# Patient Record
Sex: Male | Born: 1950 | Race: White | Hispanic: No | Marital: Married | Smoking: Current some day smoker
Health system: Southern US, Community
[De-identification: ages and names within clinical notes are randomized; demographics above are authoritative.]

## PROBLEM LIST (undated history)

## (undated) DIAGNOSIS — F32A Depression, unspecified: Secondary | ICD-10-CM

## (undated) DIAGNOSIS — K219 Gastro-esophageal reflux disease without esophagitis: Secondary | ICD-10-CM

## (undated) DIAGNOSIS — F329 Major depressive disorder, single episode, unspecified: Secondary | ICD-10-CM

## (undated) DIAGNOSIS — R011 Cardiac murmur, unspecified: Secondary | ICD-10-CM

## (undated) DIAGNOSIS — D649 Anemia, unspecified: Secondary | ICD-10-CM

## (undated) DIAGNOSIS — F419 Anxiety disorder, unspecified: Secondary | ICD-10-CM

## (undated) DIAGNOSIS — I2510 Atherosclerotic heart disease of native coronary artery without angina pectoris: Secondary | ICD-10-CM

## (undated) DIAGNOSIS — IMO0002 Reserved for concepts with insufficient information to code with codable children: Secondary | ICD-10-CM

## (undated) DIAGNOSIS — I4891 Unspecified atrial fibrillation: Secondary | ICD-10-CM

## (undated) DIAGNOSIS — I499 Cardiac arrhythmia, unspecified: Secondary | ICD-10-CM

## (undated) DIAGNOSIS — E785 Hyperlipidemia, unspecified: Secondary | ICD-10-CM

## (undated) DIAGNOSIS — C4491 Basal cell carcinoma of skin, unspecified: Secondary | ICD-10-CM

## (undated) DIAGNOSIS — M1990 Unspecified osteoarthritis, unspecified site: Secondary | ICD-10-CM

## (undated) HISTORY — PX: SQUAMOUS CELL CARCINOMA EXCISION: SHX2433

## (undated) HISTORY — PX: COLONOSCOPY: SHX174

## (undated) HISTORY — PX: TONSILLECTOMY: SUR1361

## (undated) HISTORY — PX: INGUINAL HERNIA REPAIR: SUR1180

## (undated) HISTORY — DX: Depression, unspecified: F32.A

## (undated) HISTORY — DX: Hyperlipidemia, unspecified: E78.5

## (undated) HISTORY — PX: BASAL CELL CARCINOMA EXCISION: SHX1214

## (undated) HISTORY — PX: SHOULDER SURGERY: SHX246

## (undated) HISTORY — DX: Unspecified atrial fibrillation: I48.91

## (undated) HISTORY — PX: COLECTOMY: SHX59

## (undated) HISTORY — DX: Major depressive disorder, single episode, unspecified: F32.9

---

## 2000-08-03 ENCOUNTER — Encounter: Payer: Self-pay | Admitting: Family Medicine

## 2000-08-03 ENCOUNTER — Ambulatory Visit (HOSPITAL_COMMUNITY): Admission: RE | Admit: 2000-08-03 | Discharge: 2000-08-03 | Payer: Self-pay | Admitting: Family Medicine

## 2002-01-18 ENCOUNTER — Ambulatory Visit (HOSPITAL_COMMUNITY): Admission: RE | Admit: 2002-01-18 | Discharge: 2002-01-18 | Payer: Self-pay | Admitting: Family Medicine

## 2002-01-18 ENCOUNTER — Encounter: Payer: Self-pay | Admitting: Family Medicine

## 2003-04-05 ENCOUNTER — Ambulatory Visit (HOSPITAL_COMMUNITY): Admission: RE | Admit: 2003-04-05 | Discharge: 2003-04-05 | Payer: Self-pay | Admitting: Internal Medicine

## 2005-10-06 ENCOUNTER — Ambulatory Visit (HOSPITAL_COMMUNITY): Admission: RE | Admit: 2005-10-06 | Discharge: 2005-10-06 | Payer: Self-pay | Admitting: Family Medicine

## 2005-11-11 ENCOUNTER — Encounter (HOSPITAL_COMMUNITY): Admission: RE | Admit: 2005-11-11 | Discharge: 2005-12-11 | Payer: Self-pay | Admitting: Neurosurgery

## 2005-12-10 ENCOUNTER — Ambulatory Visit (HOSPITAL_COMMUNITY): Admission: RE | Admit: 2005-12-10 | Discharge: 2005-12-10 | Payer: Self-pay | Admitting: Family Medicine

## 2007-06-27 ENCOUNTER — Emergency Department (HOSPITAL_COMMUNITY): Admission: EM | Admit: 2007-06-27 | Discharge: 2007-06-27 | Payer: Self-pay | Admitting: Emergency Medicine

## 2007-06-29 ENCOUNTER — Ambulatory Visit: Payer: Self-pay | Admitting: Orthopedic Surgery

## 2007-06-29 DIAGNOSIS — M25569 Pain in unspecified knee: Secondary | ICD-10-CM | POA: Insufficient documentation

## 2010-06-05 ENCOUNTER — Emergency Department (HOSPITAL_COMMUNITY)
Admission: EM | Admit: 2010-06-05 | Discharge: 2010-06-05 | Disposition: A | Discharge status: Home / Self Care | Payer: BC Managed Care – PPO | Attending: Emergency Medicine | Admitting: Emergency Medicine

## 2010-06-05 DIAGNOSIS — F329 Major depressive disorder, single episode, unspecified: Secondary | ICD-10-CM | POA: Insufficient documentation

## 2010-06-05 DIAGNOSIS — S0180XA Unspecified open wound of other part of head, initial encounter: Secondary | ICD-10-CM | POA: Insufficient documentation

## 2010-06-05 DIAGNOSIS — W1809XA Striking against other object with subsequent fall, initial encounter: Secondary | ICD-10-CM | POA: Insufficient documentation

## 2010-06-05 DIAGNOSIS — F3289 Other specified depressive episodes: Secondary | ICD-10-CM | POA: Insufficient documentation

## 2010-06-05 DIAGNOSIS — E7800 Pure hypercholesterolemia, unspecified: Secondary | ICD-10-CM | POA: Insufficient documentation

## 2010-08-16 NOTE — Op Note (Signed)
NAME:  Jon Jackson, Jon Jackson                        ACCOUNT NO.:  [id number]   MEDICAL RECORD NO.:  [id number]                   PATIENT TYPE:  AMB   LOCATION:  DAY                                  FACILITY:  [facility]   PHYSICIAN:  R. Roetta Sessions, M.D.              DATE OF BIRTH:  10-30-1950   DATE OF PROCEDURE:  04/05/2003  DATE OF DISCHARGE:                                 OPERATIVE REPORT   PROCEDURE:  Screening colonoscopy.   INDICATION FOR PROCEDURE:  The patient is a 60 year old gentleman sent over  out of the courtesy of W. Simone Curia, M.D., for colorectal cancer  screening.  He is devoid of any lower GI tract symptoms.  There is no family  history of colorectal cancer.  He had a colonoscopy back in the 1990s for  what sounds like a sigmoid volvulus, and he tells me Dr. Lovell Sheehan did a  sigmoid resection of his left colon at that time and he has done well ever  since (those records are not available for review).  Colonoscopy is now  being done as a screening maneuver.  This approach has been discussed with  the patient at length.  The potential risks, benefits, and alternatives have  been reviewed, questions answered.  He is agreeable.  Please see my  handwritten H&P for more information.   PROCEDURE NOTE:  The O2 saturation, blood pressure, pulse, and respirations  were monitored throughout the procedure.   CONSCIOUS SEDATION:  Versed 3 mg IV, Demerol 50 mg IV in divided doses.   INSTRUMENT USED:  Olympus video chip adult colonoscope.   FINDINGS:  Digital rectal exam revealed no abnormalities.   ENDOSCOPIC FINDINGS:  Prep was good.   Rectum:  Examination of the rectal mucosa, including retroflexed view of the  anal verge, revealed no abnormalities.   Colon:  The colonic mucosa was surveyed from the rectosigmoid junction  through the left, transverse, and right colon to the area of the appendiceal  orifice, ileocecal valve, and cecum.  These structures were well-seen and  photographed for the record.  From this level the scope was withdrawn, and  all previously-mentioned mucosal surfaces were again seen.  The only  abnormality noted was evidence of prior anastomosis at approximately 45 cm,  appearing to have a side-to-side type configuration.  Mucosa otherwise  appeared normal.  The patient tolerated the procedure well, is reacted in  endoscopy.   IMPRESSION:  1. Normal rectum.  2. Evidence of prior colon surgery, otherwise normal residual colonic     mucosa.   RECOMMENDATIONS:  Repeat colonoscopy 10 years.                                               [name], M.D.    RMR/MEDQ  D:  04/05/2003  T:  04/05/2003  Job:  [id number]   cc:   Donna Bernard, M.D.  [address]  [place]  [place] [address]  Fax: [phone]

## 2010-12-23 LAB — CBC
HCT: 49
Hemoglobin: 16.9
MCHC: 34.5
MCV: 90.9
Platelets: 198
RBC: 5.39
RDW: 12.7
WBC: 14.9 — ABNORMAL HIGH

## 2010-12-23 LAB — COMPREHENSIVE METABOLIC PANEL WITH GFR
Alkaline Phosphatase: 93
BUN: 20
Calcium: 9
Glucose, Bld: 114 — ABNORMAL HIGH
Total Protein: 6.4

## 2010-12-23 LAB — DIFFERENTIAL
Basophils Absolute: 0
Basophils Relative: 0
Eosinophils Absolute: 0
Eosinophils Relative: 0
Lymphocytes Relative: 2 — ABNORMAL LOW
Lymphs Abs: 0.2 — ABNORMAL LOW
Monocytes Absolute: 0.9
Monocytes Relative: 6
Neutro Abs: 13.7 — ABNORMAL HIGH
Neutrophils Relative %: 92 — ABNORMAL HIGH

## 2010-12-23 LAB — COMPREHENSIVE METABOLIC PANEL
ALT: 30
AST: 22
Albumin: 3.9
CO2: 24
Chloride: 111
Creatinine, Ser: 1.18
GFR calc Af Amer: >60
GFR calc non Af Amer: >60
Potassium: 4.6
Sodium: 140
Total Bilirubin: 0.7

## 2010-12-23 LAB — URINALYSIS, ROUTINE W REFLEX MICROSCOPIC
Bilirubin Urine: NEGATIVE
Glucose, UA: NEGATIVE
Hgb urine dipstick: NEGATIVE
Ketones, ur: NEGATIVE
Nitrite: NEGATIVE
Protein, ur: NEGATIVE
Specific Gravity, Urine: 1.025
Urobilinogen, UA: 0.2
pH: 5.5

## 2010-12-23 LAB — LIPASE, BLOOD: Lipase: 22

## 2012-02-18 ENCOUNTER — Ambulatory Visit (HOSPITAL_COMMUNITY)
Admission: RE | Admit: 2012-02-18 | Discharge: 2012-02-18 | Disposition: A | Discharge status: Home / Self Care | Payer: BC Managed Care – PPO | Source: Ambulatory Visit | Attending: Family Medicine | Admitting: Family Medicine

## 2012-02-18 ENCOUNTER — Other Ambulatory Visit: Payer: Self-pay | Admitting: Family Medicine

## 2012-02-18 DIAGNOSIS — M25559 Pain in unspecified hip: Secondary | ICD-10-CM | POA: Insufficient documentation

## 2012-02-18 DIAGNOSIS — M25551 Pain in right hip: Secondary | ICD-10-CM

## 2012-04-13 ENCOUNTER — Other Ambulatory Visit: Payer: Self-pay | Admitting: Orthopedic Surgery

## 2012-04-13 DIAGNOSIS — M48061 Spinal stenosis, lumbar region without neurogenic claudication: Secondary | ICD-10-CM

## 2012-06-21 ENCOUNTER — Encounter: Payer: Self-pay | Admitting: *Deleted

## 2012-06-21 ENCOUNTER — Other Ambulatory Visit: Payer: Self-pay | Admitting: *Deleted

## 2012-06-21 DIAGNOSIS — K219 Gastro-esophageal reflux disease without esophagitis: Secondary | ICD-10-CM | POA: Insufficient documentation

## 2012-06-21 DIAGNOSIS — E785 Hyperlipidemia, unspecified: Secondary | ICD-10-CM | POA: Insufficient documentation

## 2012-06-21 MED ORDER — TAMSULOSIN HCL 0.4 MG PO CAPS: 0.4000 mg | ORAL_CAPSULE | Freq: Every day | ORAL | Status: DC

## 2012-09-05 ENCOUNTER — Other Ambulatory Visit: Payer: Self-pay | Admitting: Family Medicine

## 2012-09-23 ENCOUNTER — Other Ambulatory Visit: Payer: Self-pay | Admitting: Family Medicine

## 2012-10-18 ENCOUNTER — Other Ambulatory Visit: Payer: Self-pay | Admitting: Family Medicine

## 2012-11-23 ENCOUNTER — Ambulatory Visit (INDEPENDENT_AMBULATORY_CARE_PROVIDER_SITE_OTHER): Payer: BC Managed Care – PPO | Admitting: Family Medicine

## 2012-11-23 ENCOUNTER — Encounter: Payer: Self-pay | Admitting: Family Medicine

## 2012-11-23 VITALS — BP 154/88 | Temp 97.7°F | Ht 69.5 in | Wt 222.8 lb

## 2012-11-23 DIAGNOSIS — J019 Acute sinusitis, unspecified: Secondary | ICD-10-CM

## 2012-11-23 DIAGNOSIS — J309 Allergic rhinitis, unspecified: Secondary | ICD-10-CM

## 2012-11-23 MED ORDER — CEFPROZIL 500 MG PO TABS: 500.0000 mg | ORAL_TABLET | Freq: Two times a day (BID) | ORAL | Status: DC

## 2012-11-23 MED ORDER — METHYLPREDNISOLONE ACETATE 40 MG/ML IJ SUSP
40.0000 mg | Freq: Once | INTRAMUSCULAR | Status: AC
  Administered 2012-11-23: 40 mg via INTRAMUSCULAR

## 2012-11-23 NOTE — Progress Notes (Signed)
  Subjective:    Patient ID: MARQUAL MI, male    DOB: September 10, 1950, 62 y.o.   MRN: [id number]  Sinusitis This is a new problem. The current episode started in the past 7 days. The problem has been gradually worsening since onset. There has been no fever. Associated symptoms include congestion, coughing, ear pain, headaches, sinus pressure and a sore throat. Past treatments include oral decongestants. The treatment provided no relief.      Review of Systems  Constitutional: Negative for fever and activity change.  HENT: Positive for ear pain, congestion, sore throat, rhinorrhea and sinus pressure.   Eyes: Negative for discharge.  Respiratory: Positive for cough. Negative for wheezing.   Cardiovascular: Negative for chest pain.  Neurological: Positive for headaches.       Objective:   Physical Exam  Nursing note and vitals reviewed. Constitutional: He appears well-developed.  HENT:  Head: Normocephalic.  Mouth/Throat: Oropharynx is clear and moist. No oropharyngeal exudate.  Neck: Normal range of motion.  Cardiovascular: Normal rate, regular rhythm and normal heart sounds.   No murmur heard. Pulmonary/Chest: Effort normal and breath sounds normal. He has no wheezes.  Lymphadenopathy:    He has no cervical adenopathy.  Neurological: He exhibits normal muscle tone.  Skin: Skin is warm and dry.          Assessment & Plan:  #1 allergic rhinitis Depo-Medrol 40 mg IM #2 sinusitis Cefzil 10 days as directed if high fevers or worse followup #3 wellness exam this fall.

## 2012-12-06 ENCOUNTER — Other Ambulatory Visit: Payer: Self-pay

## 2012-12-06 ENCOUNTER — Telehealth: Payer: Self-pay | Admitting: Family Medicine

## 2012-12-06 MED ORDER — AZITHROMYCIN 250 MG PO TABS: ORAL_TABLET | ORAL | Status: DC

## 2012-12-06 NOTE — Telephone Encounter (Signed)
Ok z pk 

## 2012-12-06 NOTE — Telephone Encounter (Signed)
Just finished antibiotics on Saturday.  Still no better head congestion, sinus pressure around eyes, pressure in ears.  Is taking Mucinex.  No fever but "feels feverish".  Could you call in [facility] to [facility] in [place].

## 2012-12-06 NOTE — Telephone Encounter (Signed)
Zpak sent to [facility]. Left message on voicemail notifying Toniann Fail.

## 2012-12-07 ENCOUNTER — Other Ambulatory Visit: Payer: Self-pay | Admitting: Family Medicine

## 2012-12-10 ENCOUNTER — Encounter: Payer: Self-pay | Admitting: Family Medicine

## 2012-12-10 ENCOUNTER — Ambulatory Visit (INDEPENDENT_AMBULATORY_CARE_PROVIDER_SITE_OTHER): Payer: BC Managed Care – PPO | Admitting: Family Medicine

## 2012-12-10 VITALS — BP 112/72 | Temp 98.1°F | Ht 69.5 in | Wt 215.6 lb

## 2012-12-10 DIAGNOSIS — J309 Allergic rhinitis, unspecified: Secondary | ICD-10-CM

## 2012-12-10 MED ORDER — AMOXICILLIN-POT CLAVULANATE 875-125 MG PO TABS: 1.0000 | ORAL_TABLET | Freq: Two times a day (BID) | ORAL | Status: AC

## 2012-12-10 MED ORDER — FLUTICASONE PROPIONATE 50 MCG/ACT NA SUSP: 1.0000 | Freq: Every day | NASAL | Status: DC

## 2012-12-10 NOTE — Progress Notes (Signed)
  Subjective:    Patient ID: Jon Jackson, male    DOB: Oct 25, 1950, 62 y.o.   MRN: [id number]  HPI Patient is here today for f/u from 8/26. He no longer feels like he has an infection, but he is still having sinus pressure and post nasal drip.   Some pressure, nighttime  xysal taking daily. Worked pretty well, seems less affective.  Fall allergies are starting to kick in. Some congestion drainage. Some minimal sore throat.  Occasional cough.  No obvious fever       Review of Systems No chest pain no abdominal pain no fever no rash ROS otherwise negative    Objective:   Physical Exam Alert hydration good. Vitals reviewed. Frontal maxillary tenderness and fullness. And nasal congestion. Neck supple. Lungs clear heart regular in rhythm.       Assessment & Plan:  Impression rhinosinusitis. #2 allergic rhinitis discussed. Plan add Flonase 2 sprays each nostril daily. Antibiotics prescribed. Symptomatic care discussed. WSL

## 2012-12-12 DIAGNOSIS — J309 Allergic rhinitis, unspecified: Secondary | ICD-10-CM | POA: Insufficient documentation

## 2013-01-14 ENCOUNTER — Ambulatory Visit: Payer: BC Managed Care – PPO

## 2013-01-19 ENCOUNTER — Encounter: Payer: BC Managed Care – PPO | Admitting: Family Medicine

## 2013-01-20 ENCOUNTER — Ambulatory Visit: Payer: BC Managed Care – PPO

## 2013-02-02 ENCOUNTER — Ambulatory Visit (INDEPENDENT_AMBULATORY_CARE_PROVIDER_SITE_OTHER): Payer: BC Managed Care – PPO | Admitting: *Deleted

## 2013-02-02 DIAGNOSIS — Z23 Encounter for immunization: Secondary | ICD-10-CM

## 2013-02-05 ENCOUNTER — Other Ambulatory Visit: Payer: Self-pay | Admitting: Family Medicine

## 2013-03-05 ENCOUNTER — Other Ambulatory Visit: Payer: Self-pay | Admitting: Family Medicine

## 2013-03-23 ENCOUNTER — Other Ambulatory Visit: Payer: Self-pay | Admitting: Family Medicine

## 2013-04-05 ENCOUNTER — Telehealth: Payer: Self-pay | Admitting: Family Medicine

## 2013-04-05 ENCOUNTER — Other Ambulatory Visit: Payer: Self-pay | Admitting: *Deleted

## 2013-04-05 DIAGNOSIS — Z79899 Other long term (current) drug therapy: Secondary | ICD-10-CM

## 2013-04-05 DIAGNOSIS — Z125 Encounter for screening for malignant neoplasm of prostate: Secondary | ICD-10-CM

## 2013-04-05 DIAGNOSIS — E785 Hyperlipidemia, unspecified: Secondary | ICD-10-CM

## 2013-04-05 NOTE — Telephone Encounter (Signed)
Patient notified

## 2013-04-05 NOTE — Telephone Encounter (Signed)
Pt needs BW orders please, was supposed to be done already not sure  What happened?   He had a PE scheduled for this Thursday.   Call pt when sent to [facility]

## 2013-04-05 NOTE — Telephone Encounter (Signed)
Lip liv m7 psa 

## 2013-04-06 LAB — BASIC METABOLIC PANEL
BUN: 17 mg/dL (ref 6–23)
CALCIUM: 9.5 mg/dL (ref 8.4–10.5)
CO2: 25 mEq/L (ref 19–32)
CREATININE: 0.91 mg/dL (ref 0.50–1.35)
Chloride: 108 mEq/L (ref 96–112)
GLUCOSE: 89 mg/dL (ref 70–99)
Potassium: 4.4 mEq/L (ref 3.5–5.3)
SODIUM: 142 meq/L (ref 135–145)

## 2013-04-06 LAB — LIPID PANEL
CHOLESTEROL: 152 mg/dL (ref 0–200)
HDL: 39 mg/dL — ABNORMAL LOW (ref >39)
LDL Cholesterol: 94 mg/dL (ref 0–99)
TRIGLYCERIDES: 94 mg/dL (ref <150)
Total CHOL/HDL Ratio: 3.9 Ratio
VLDL: 19 mg/dL (ref 0–40)

## 2013-04-06 LAB — HEPATIC FUNCTION PANEL
ALT: 20 U/L (ref 0–53)
AST: 19 U/L (ref 0–37)
Albumin: 4 g/dL (ref 3.5–5.2)
Alkaline Phosphatase: 86 U/L (ref 39–117)
BILIRUBIN INDIRECT: 0.4 mg/dL (ref 0.0–0.9)
Bilirubin, Direct: 0.1 mg/dL (ref 0.0–0.3)
TOTAL PROTEIN: 6.7 g/dL (ref 6.0–8.3)
Total Bilirubin: 0.5 mg/dL (ref 0.3–1.2)

## 2013-04-06 LAB — PSA: PSA: 0.47 ng/mL (ref <=4.00)

## 2013-04-07 ENCOUNTER — Ambulatory Visit (INDEPENDENT_AMBULATORY_CARE_PROVIDER_SITE_OTHER): Payer: BC Managed Care – PPO | Admitting: Family Medicine

## 2013-04-07 ENCOUNTER — Encounter: Payer: Self-pay | Admitting: Family Medicine

## 2013-04-07 VITALS — BP 120/84 | Ht 67.5 in | Wt 225.2 lb

## 2013-04-07 DIAGNOSIS — N400 Enlarged prostate without lower urinary tract symptoms: Secondary | ICD-10-CM

## 2013-04-07 DIAGNOSIS — F329 Major depressive disorder, single episode, unspecified: Secondary | ICD-10-CM

## 2013-04-07 DIAGNOSIS — Z0000 Encounter for general adult medical examination without abnormal findings: Secondary | ICD-10-CM

## 2013-04-07 DIAGNOSIS — F3289 Other specified depressive episodes: Secondary | ICD-10-CM

## 2013-04-07 DIAGNOSIS — F32A Depression, unspecified: Secondary | ICD-10-CM

## 2013-04-07 MED ORDER — SIMVASTATIN 20 MG PO TABS: ORAL_TABLET | ORAL | Status: DC

## 2013-04-07 MED ORDER — OMEPRAZOLE 40 MG PO CPDR: 40.0000 mg | DELAYED_RELEASE_CAPSULE | Freq: Every day | ORAL | Status: DC

## 2013-04-07 MED ORDER — TAMSULOSIN HCL 0.4 MG PO CAPS: ORAL_CAPSULE | ORAL | Status: DC

## 2013-04-07 MED ORDER — CITALOPRAM HYDROBROMIDE 20 MG PO TABS: ORAL_TABLET | ORAL | Status: DC

## 2013-04-07 MED ORDER — LEVOCETIRIZINE DIHYDROCHLORIDE 5 MG PO TABS: ORAL_TABLET | ORAL | Status: DC

## 2013-04-07 NOTE — Progress Notes (Signed)
Subjective:    Patient ID: Jon Jackson, male    DOB: Aug 12, 1950, 63 y.o.   MRN: [id number]  HPI Patient is here today for his annual wellness exam. Has no concerns or issues at this time.   Results for orders placed in visit on 04/05/13  LIPID PANEL      Result Value Range   Cholesterol 152  0 - 200 mg/dL   Triglycerides 94  <150 mg/dL   HDL 39 (*) >39 mg/dL   Total CHOL/HDL Ratio 3.9     VLDL 19  0 - 40 mg/dL   LDL Cholesterol 94  0 - 99 mg/dL  HEPATIC FUNCTION PANEL      Result Value Range   Total Bilirubin 0.5  0.3 - 1.2 mg/dL   Bilirubin, Direct 0.1  0.0 - 0.3 mg/dL   Indirect Bilirubin 0.4  0.0 - 0.9 mg/dL   Alkaline Phosphatase 86  39 - 117 U/L   AST 19  0 - 37 U/L   ALT 20  0 - 53 U/L   Total Protein 6.7  6.0 - 8.3 g/dL   Albumin 4.0  3.5 - 5.2 g/dL  PSA      Result Value Range   PSA 0.47  <=4.00 ng/mL  BASIC METABOLIC PANEL      Result Value Range   Sodium 142  135 - 145 mEq/L   Potassium 4.4  3.5 - 5.3 mEq/L   Chloride 108  96 - 112 mEq/L   CO2 25  19 - 32 mEq/L   Glucose, Bld 89  70 - 99 mg/dL   BUN 17  6 - 23 mg/dL   Creat 0.91  0.50 - 1.35 mg/dL   Calcium 9.5  8.4 - 10.5 mg/dL   Diet off and on  Last colonoscopy jan o f 2005, had it done loca Doing very well.    Overall exercising regularly.  Reflux is clinically stable at this time. Patient claims compliance with lipid medication. Also compliant with Flomax. It has persisted his urination situation.  Review of Systems  Constitutional: Negative for fever, activity change and appetite change.  HENT: Negative for congestion and rhinorrhea.   Eyes: Negative for discharge.  Respiratory: Negative for cough and wheezing.   Cardiovascular: Negative for chest pain.  Gastrointestinal: Negative for vomiting, abdominal pain and blood in stool.  Genitourinary: Positive for difficulty urinating. Negative for frequency.       Some nocturnal frequency intermittently  Musculoskeletal: Negative for neck  pain.  Skin: Negative for rash.  Allergic/Immunologic: Negative for environmental allergies and food allergies.  Neurological: Negative for weakness and headaches.  Psychiatric/Behavioral: Negative for agitation.  All other systems reviewed and are negative.       Objective:   Physical Exam  Vitals reviewed. Constitutional: He appears well-developed and well-nourished.  HENT:  Head: Normocephalic and atraumatic.  Right Ear: External ear normal.  Left Ear: External ear normal.  Nose: Nose normal.  Mouth/Throat: Oropharynx is clear and moist.  Eyes: EOM are normal. Pupils are equal, round, and reactive to light.  Neck: Normal range of motion. Neck supple. No thyromegaly present.  Cardiovascular: Normal rate, regular rhythm and normal heart sounds.   No murmur heard. Pulmonary/Chest: Effort normal and breath sounds normal. No respiratory distress. He has no wheezes.  Abdominal: Soft. Bowel sounds are normal. He exhibits no distension and no mass. There is no tenderness.  Genitourinary: Penis normal.  Prostate mildly enlarged diffusely no nodules or abnormalities otherwise  Musculoskeletal: Normal range of motion. He exhibits no edema.  Lymphadenopathy:    He has no cervical adenopathy.  Neurological: He is alert. He exhibits normal muscle tone.  Skin: Skin is warm and dry. No erythema.  Psychiatric: He has a normal mood and affect. His behavior is normal. Judgment normal.          Assessment & Plan:  #1 wellness exam. #2 hyperlipidemia good control. #3 reflux stable. #4 prostate hypertrophy ongoing. #5 depression clinically stable plan maintain same meds. Diet exercise discussed. Check in 6 months. Given shingles vaccine prescription WSL

## 2013-04-08 DIAGNOSIS — F329 Major depressive disorder, single episode, unspecified: Secondary | ICD-10-CM | POA: Insufficient documentation

## 2013-04-08 DIAGNOSIS — F32A Depression, unspecified: Secondary | ICD-10-CM | POA: Insufficient documentation

## 2013-04-08 DIAGNOSIS — N400 Enlarged prostate without lower urinary tract symptoms: Secondary | ICD-10-CM | POA: Insufficient documentation

## 2013-05-04 ENCOUNTER — Telehealth: Payer: Self-pay | Admitting: *Deleted

## 2013-05-04 NOTE — Telephone Encounter (Signed)
Pt called to schedule a colonoscopy. please advise [phone]

## 2013-05-05 ENCOUNTER — Other Ambulatory Visit: Payer: Self-pay

## 2013-05-05 DIAGNOSIS — Z1211 Encounter for screening for malignant neoplasm of colon: Secondary | ICD-10-CM

## 2013-05-05 NOTE — Telephone Encounter (Signed)
Gastroenterology Pre-Procedure Review  Request Date: 05/05/2013  Requesting Physician: Pt received a letter that it was time for his next colonoscopy His last one was 04/05/2003 by Dr. [name]  Pt said he drinks 4-5 beers on the week-end  PATIENT REVIEW QUESTIONS: The patient responded to the following health history questions as indicated:    1. Diabetes Melitis: no 2. Joint replacements in the past 12 months: no 3. Major health problems in the past 3 months: no 4. Has an artificial valve or MVP: no 5. Has a defibrillator: no 6. Has been advised in past to take antibiotics in advance of a procedure like teeth cleaning: no    MEDICATIONS & ALLERGIES:    Patient reports the following regarding taking any blood thinners:   Plavix? no  Aspirin? YES Coumadin? no  Patient confirms/reports the following medications:  Current Outpatient Prescriptions  Medication Sig Dispense Refill  . acetaminophen (TYLENOL) 325 MG tablet Take 650 mg by mouth every 6 (six) hours as needed.      Marland Kitchen aspirin 325 MG tablet Take 325 mg by mouth daily.      . citalopram (CELEXA) 20 MG tablet TAKE 1 TABLET BY MOUTH EVERY DAY  90 tablet  1  . fluticasone (FLONASE) 50 MCG/ACT nasal spray Place 1 spray into the nose daily.  16 g  2  . levocetirizine (XYZAL) 5 MG tablet TAKE 1 TABLET EVERY DAY  90 tablet  1  . Multiple Vitamin (MULTIVITAMIN) tablet Take 1 tablet by mouth daily.      Marland Kitchen omeprazole (PRILOSEC) 40 MG capsule Take 1 capsule (40 mg total) by mouth daily.  90 capsule  1  . simvastatin (ZOCOR) 20 MG tablet TAKE 1 TABLET BY MOUTH EVERY DAY  90 tablet  1  . tamsulosin (FLOMAX) 0.4 MG CAPS capsule TAKE 1 CAPSULE BY MOUTH AT BEDTIME. prn  90 capsule  1   No current facility-administered medications for this visit.    Patient confirms/reports the following allergies:  No Known Allergies  No orders of the defined types were placed in this encounter.    AUTHORIZATION INFORMATION Primary Insurance:   ID #:    Group #:  Pre-Cert / Auth required:  Pre-Cert / Auth #:   Secondary Insurance:  ID #:  Group #:  Pre-Cert / Auth required: Pre-Cert / Auth #:   SCHEDULE INFORMATION: Procedure has been scheduled as follows:  Date:   05/30/2013                 Time: 8:45 AM Location: [facility] Short Stay  This Gastroenterology Pre-Precedure Review Form is being routed to the following provider(s): R. Garfield Cornea, MD

## 2013-05-05 NOTE — Telephone Encounter (Signed)
OK to schedule

## 2013-05-06 MED ORDER — PEG-KCL-NACL-NASULF-NA ASC-C 100 G PO SOLR: 1.0000 | ORAL | Status: DC

## 2013-05-06 NOTE — Telephone Encounter (Signed)
Rx sent to the pharmacy and instructions mailed to pt.  

## 2013-05-06 NOTE — Addendum Note (Signed)
Addended by: Everardo All on: 05/06/2013 08:07 AM   Modules accepted: Orders

## 2013-05-18 ENCOUNTER — Encounter (HOSPITAL_COMMUNITY): Payer: Self-pay | Admitting: Pharmacy Technician

## 2013-05-24 ENCOUNTER — Telehealth: Payer: Self-pay

## 2013-05-24 NOTE — Telephone Encounter (Signed)
I called [facility] at [phone] and spoke to [place] J who said that a PA is not required for a screening colonoscopy.

## 2013-05-30 ENCOUNTER — Encounter (HOSPITAL_COMMUNITY): Payer: Self-pay

## 2013-05-30 ENCOUNTER — Encounter (HOSPITAL_COMMUNITY)
Admission: RE | Disposition: A | Discharge status: Home / Self Care | Payer: Self-pay | Source: Ambulatory Visit | Attending: Internal Medicine

## 2013-05-30 ENCOUNTER — Ambulatory Visit (HOSPITAL_COMMUNITY)
Admission: RE | Admit: 2013-05-30 | Discharge: 2013-05-30 | Disposition: A | Discharge status: Home / Self Care | Payer: BC Managed Care – PPO | Source: Ambulatory Visit | Attending: Internal Medicine | Admitting: Internal Medicine

## 2013-05-30 DIAGNOSIS — K5730 Diverticulosis of large intestine without perforation or abscess without bleeding: Secondary | ICD-10-CM | POA: Insufficient documentation

## 2013-05-30 DIAGNOSIS — I10 Essential (primary) hypertension: Secondary | ICD-10-CM | POA: Insufficient documentation

## 2013-05-30 DIAGNOSIS — Z9049 Acquired absence of other specified parts of digestive tract: Secondary | ICD-10-CM | POA: Insufficient documentation

## 2013-05-30 DIAGNOSIS — Z800 Family history of malignant neoplasm of digestive organs: Secondary | ICD-10-CM

## 2013-05-30 DIAGNOSIS — Z79899 Other long term (current) drug therapy: Secondary | ICD-10-CM | POA: Insufficient documentation

## 2013-05-30 DIAGNOSIS — Z1211 Encounter for screening for malignant neoplasm of colon: Secondary | ICD-10-CM | POA: Insufficient documentation

## 2013-05-30 DIAGNOSIS — Z7982 Long term (current) use of aspirin: Secondary | ICD-10-CM | POA: Insufficient documentation

## 2013-05-30 HISTORY — DX: Gastro-esophageal reflux disease without esophagitis: K21.9

## 2013-05-30 HISTORY — PX: COLONOSCOPY: SHX5424

## 2013-05-30 SURGERY — COLONOSCOPY
Anesthesia: Moderate Sedation

## 2013-05-30 MED ORDER — MEPERIDINE HCL 100 MG/ML IJ SOLN
INTRAMUSCULAR | Status: AC
  Filled 2013-05-30: qty 1

## 2013-05-30 MED ORDER — ONDANSETRON HCL 4 MG/2ML IJ SOLN
INTRAMUSCULAR | Status: AC
  Filled 2013-05-30: qty 2

## 2013-05-30 MED ORDER — SODIUM CHLORIDE 0.9 % IV SOLN
INTRAVENOUS | Status: DC
  Administered 2013-05-30: 08:00:00 via INTRAVENOUS

## 2013-05-30 MED ORDER — STERILE WATER FOR IRRIGATION IR SOLN
Status: DC | PRN
  Administered 2013-05-30: 09:00:00

## 2013-05-30 MED ORDER — MIDAZOLAM HCL 5 MG/5ML IJ SOLN
INTRAMUSCULAR | Status: AC
  Filled 2013-05-30: qty 10

## 2013-05-30 MED ORDER — MEPERIDINE HCL 100 MG/ML IJ SOLN
INTRAMUSCULAR | Status: DC | PRN
  Administered 2013-05-30: 25 mg via INTRAVENOUS
  Administered 2013-05-30: 50 mg via INTRAVENOUS

## 2013-05-30 MED ORDER — ONDANSETRON HCL 4 MG/2ML IJ SOLN
INTRAMUSCULAR | Status: DC | PRN
  Administered 2013-05-30: 4 mg via INTRAVENOUS

## 2013-05-30 MED ORDER — MIDAZOLAM HCL 5 MG/5ML IJ SOLN
INTRAMUSCULAR | Status: DC | PRN
  Administered 2013-05-30 (×2): 2 mg via INTRAVENOUS

## 2013-05-30 MED ORDER — MEPERIDINE HCL 100 MG/ML IJ SOLN
INTRAMUSCULAR | Status: AC
  Filled 2013-05-30: qty 2

## 2013-05-30 NOTE — H&P (Signed)
Primary Care Physician:  Rubbie Battiest, MD Primary Gastroenterologist:  Dr. [name]  Pre-Procedure History & Physical: HPI:  Jon Jackson is a 63 y.o. male is here for a screening colonoscopy. Positive family history of colon cancer in his brother at age 33. Prior history of colon surgery for volvulus. No bowel symptoms currently.  Past Medical History  Diagnosis Date  . Hypertension   . GERD (gastroesophageal reflux disease)   . Cancer     skin cancer squamous cell off head    Past Surgical History  Procedure Laterality Date  . Colon surgery      20 years ago  . Hernia repair      25 -30 years ago [facility]    Prior to Admission medications   Medication Sig Start Date End Date Taking? Authorizing Provider  acetaminophen (TYLENOL) 325 MG tablet Take 650 mg by mouth every 6 (six) hours as needed for mild pain, moderate pain or fever.    Yes Historical Provider, MD  aspirin 325 MG tablet Take 325 mg by mouth daily.   Yes Historical Provider, MD  Aspirin-Acetaminophen (GOODY BODY PAIN) 500-325 MG PACK Take 1 packet by mouth daily as needed (for pain).   Yes Historical Provider, MD  citalopram (CELEXA) 20 MG tablet TAKE 1 TABLET BY MOUTH EVERY DAY 04/07/13  Yes Mikey Kirschner, MD  levocetirizine (XYZAL) 5 MG tablet TAKE 1 TABLET EVERY DAY 04/07/13  Yes Mikey Kirschner, MD  Multiple Vitamin (MULTIVITAMIN) tablet Take 1 tablet by mouth daily.   Yes Historical Provider, MD  omeprazole (PRILOSEC) 40 MG capsule Take 1 capsule (40 mg total) by mouth daily. 04/07/13  Yes Mikey Kirschner, MD  peg 3350 powder (MOVIPREP) 100 G SOLR Take 1 kit (200 g total) by mouth as directed. 05/06/13  Yes [name], MD  simvastatin (ZOCOR) 20 MG tablet TAKE 1 TABLET BY MOUTH EVERY DAY 04/07/13  Yes Mikey Kirschner, MD  tamsulosin (FLOMAX) 0.4 MG CAPS capsule TAKE 1 CAPSULE BY MOUTH AT BEDTIME. prn 04/07/13  Yes Mikey Kirschner, MD    Allergies as of 05/05/2013  . (No Known Allergies)    Family History   Problem Relation Age of Onset  . Colon cancer Brother     History   Social History  . Marital Status: Married    Spouse Name: N/A    Number of Children: N/A  . Years of Education: N/A   Occupational History  . Not on file.   Social History Main Topics  . Smoking status: Light Tobacco Smoker    Types: Cigars  . Smokeless tobacco: Not on file  . Alcohol Use: 6.0 oz/week    10 Cans of beer per week  . Drug Use: No  . Sexual Activity: Not on file   Other Topics Concern  . Not on file   Social History Narrative  . No narrative on file    Review of Systems: See HPI, otherwise negative ROS  Physical Exam: BP 117/78  Pulse 55  Temp(Src) 98.4 F (36.9 C) (Oral)  Resp 16  Ht 5' 9.5" (1.765 m)  Wt 220 lb (99.791 kg)  BMI 32.03 kg/m2  SpO2 95% General:   Alert,  Well-developed, well-nourished, pleasant and cooperative in NAD Head:  Normocephalic and atraumatic. Eyes:  Sclera clear, no icterus.   Conjunctiva pink. Ears:  Normal auditory acuity. Nose:  No deformity, discharge,  or lesions. Mouth:  No deformity or lesions, dentition normal. Neck:  Supple;  no masses or thyromegaly. Lungs:  Clear throughout to auscultation.   No wheezes, crackles, or rhonchi. No acute distress. Heart:  Regular rate and rhythm; no murmurs, clicks, rubs,  or gallops. Abdomen:  Soft, nontender and nondistended. No masses, hepatosplenomegaly or hernias noted. Normal bowel sounds, without guarding, and without rebound.   Msk:  Symmetrical without gross deformities. Normal posture. Pulses:  Normal pulses noted. Extremities:  Without clubbing or edema. Neurologic:  Alert and  oriented x4;  grossly normal neurologically. Skin:  Intact without significant lesions or rashes. Cervical Nodes:  No significant cervical adenopathy. Psych:  Alert and cooperative. Normal mood and affect.  Impression/Plan: [name] is now here to undergo a screening colonoscopy. High risk screening  examination.  Risks, benefits, limitations, imponderables and alternatives regarding colonoscopy have been reviewed with the patient. Questions have been answered. All parties agreeable.

## 2013-05-30 NOTE — Op Note (Signed)
[facility] [address] [place], [address]   COLONOSCOPY PROCEDURE REPORT  PATIENT: Jon, Jackson  MR#:         [id number] BIRTHDATE: [birth year] , 66  yrs. old GENDER: Male ENDOSCOPIST: R.  Garfield Cornea, MD FACP FACG REFERRED BY:  Rosemary Holms, M.D. PROCEDURE DATE:  05/30/2013 PROCEDURE:     Screening ileocolonoscopy  INDICATIONS: Positive family history colon cancer in a first-degree relative  INFORMED CONSENT:  The risks, benefits, alternatives and imponderables including but not limited to bleeding, perforation as well as the possibility of a missed lesion have been reviewed.  The potential for biopsy, lesion removal, etc. have also been discussed.  Questions have been answered.  All parties agreeable. Please see the history and physical in the medical record for more information.  MEDICATIONS: Versed 4 mg IV and Demerol 75 mg IV in divided doses. Zofran 4 mg IV  DESCRIPTION OF PROCEDURE:  After a digital rectal exam was performed, the EC-[id number] ([id number])  colonoscope was advanced from the anus through the rectum and colon to the area of the cecum, ileocecal valve and appendiceal orifice.  The cecum was deeply intubated.  These structures were well-seen and photographed for the record.  From the level of the cecum and ileocecal valve, the scope was slowly and cautiously withdrawn.  The mucosal surfaces were carefully surveyed utilizing scope tip deflection to facilitate fold flattening as needed.  The scope was pulled down into the rectum where a thorough examination including retroflexion was performed.    FINDINGS: Adequate  preparation. Normal rectum. Scattered distal colonic diverticula with anastomosis identified consistent with prior sigmoid resection; otherwise, the remainder of the colonic mucosa appeared normal. The distal 5 cm of terminal ileal mucosa appeared normal as well.  THERAPEUTIC / DIAGNOSTIC MANEUVERS PERFORMED:   None  COMPLICATIONS: None  CECAL WITHDRAWAL TIME:  9 minutes  IMPRESSION:  Colonic diverticulosis.  Status post prior segmental colon resection  RECOMMENDATIONS: Repeat colonoscopy in 5 years for screening purposes   _______________________________ eSigned:  R. Garfield Cornea, MD FACP [facility] 05/30/2013 9:13 AM   CC:

## 2013-05-30 NOTE — Discharge Instructions (Signed)
Colonoscopy Discharge Instructions  Read the instructions outlined below and refer to this sheet in the next few weeks. These discharge instructions provide you with general information on caring for yourself after you leave the hospital. Your doctor may also give you specific instructions. While your treatment has been planned according to the most current medical practices available, unavoidable complications occasionally occur. If you have any problems or questions after discharge, call Dr. [name] at [phone]. ACTIVITY  You may resume your regular activity, but move at a slower pace for the next 24 hours.   Take frequent rest periods for the next 24 hours.   Walking will help get rid of the air and reduce the bloated feeling in your belly (abdomen).   No driving for 24 hours (because of the medicine (anesthesia) used during the test).    Do not sign any important legal documents or operate any machinery for 24 hours (because of the anesthesia used during the test).  NUTRITION  Drink plenty of fluids.   You may resume your normal diet as instructed by your doctor.   Begin with a light meal and progress to your normal diet. Heavy or fried foods are harder to digest and may make you feel sick to your stomach (nauseated).   Avoid alcoholic beverages for 24 hours or as instructed.  MEDICATIONS  You may resume your normal medications unless your doctor tells you otherwise.  WHAT YOU CAN EXPECT TODAY  Some feelings of bloating in the abdomen.   Passage of more gas than usual.   Spotting of blood in your stool or on the toilet paper.  IF YOU HAD POLYPS REMOVED DURING THE COLONOSCOPY:  No aspirin products for 7 days or as instructed.   No alcohol for 7 days or as instructed.   Eat a soft diet for the next 24 hours.  FINDING OUT THE RESULTS OF YOUR TEST Not all test results are available during your visit. If your test results are not back during the visit, make an appointment  with your caregiver to find out the results. Do not assume everything is normal if you have not heard from your caregiver or the medical facility. It is important for you to follow up on all of your test results.  SEEK IMMEDIATE MEDICAL ATTENTION IF:  You have more than a spotting of blood in your stool.   Your belly is swollen (abdominal distention).   You are nauseated or vomiting.   You have a temperature over 101.   You have abdominal pain or discomfort that is severe or gets worse throughout the day.   Diverticulosis information provided  Repeat screening colonoscopy in 5 years    Diverticulosis Diverticulosis is a common condition that develops when small pouches (diverticula) form in the wall of the colon. The risk of diverticulosis increases with age. It happens more often in people who eat a low-fiber diet. Most individuals with diverticulosis have no symptoms. Those individuals with symptoms usually experience abdominal pain, constipation, or loose stools (diarrhea). HOME CARE INSTRUCTIONS   Increase the amount of fiber in your diet as directed by your caregiver or dietician. This may reduce symptoms of diverticulosis.  Your caregiver may recommend taking a dietary fiber supplement.  Drink at least 6 to 8 glasses of water each day to prevent constipation.  Try not to strain when you have a bowel movement.  Your caregiver may recommend avoiding nuts and seeds to prevent complications, although this is still an uncertain benefit.  Only take over-the-counter or prescription medicines for pain, discomfort, or fever as directed by your caregiver. FOODS WITH HIGH FIBER CONTENT INCLUDE:  Fruits. Apple, peach, pear, tangerine, raisins, prunes.  Vegetables. Brussels sprouts, asparagus, broccoli, cabbage, carrot, cauliflower, romaine lettuce, spinach, summer squash, tomato, winter squash, zucchini.  Starchy Vegetables. Baked beans, kidney beans, lima beans, split peas,  lentils, potatoes (with skin).  Grains. Whole wheat bread, brown rice, bran flake cereal, plain oatmeal, white rice, shredded wheat, bran muffins. SEEK IMMEDIATE MEDICAL CARE IF:   You develop increasing pain or severe bloating.  You have an oral temperature above 102 F (38.9 C), not controlled by medicine.  You develop vomiting or bowel movements that are bloody or black. Document Released: 12/13/2003 Document Revised: 06/09/2011 Document Reviewed: 08/15/2009 [facility] Patient Information 2014 [place].

## 2013-06-03 ENCOUNTER — Encounter (HOSPITAL_COMMUNITY): Payer: Self-pay | Admitting: Internal Medicine

## 2013-09-29 ENCOUNTER — Other Ambulatory Visit: Payer: Self-pay | Admitting: Family Medicine

## 2013-12-25 ENCOUNTER — Other Ambulatory Visit: Payer: Self-pay | Admitting: Family Medicine

## 2014-02-22 ENCOUNTER — Other Ambulatory Visit: Payer: Self-pay | Admitting: Family Medicine

## 2014-03-15 ENCOUNTER — Telehealth: Payer: Self-pay | Admitting: Family Medicine

## 2014-03-15 DIAGNOSIS — E785 Hyperlipidemia, unspecified: Secondary | ICD-10-CM

## 2014-03-15 DIAGNOSIS — Z79899 Other long term (current) drug therapy: Secondary | ICD-10-CM

## 2014-03-15 DIAGNOSIS — Z125 Encounter for screening for malignant neoplasm of prostate: Secondary | ICD-10-CM

## 2014-03-15 NOTE — Telephone Encounter (Signed)
Rep same, i already spoke with pt about it being in the syst with no requirement for papers (also offered condolence on brother)

## 2014-03-15 NOTE — Telephone Encounter (Signed)
Pt needs bw orders for appt on the 11th of Jan   Last labs 1/7  Lipid, Hep Func, PSA, BMP

## 2014-03-15 NOTE — Telephone Encounter (Signed)
bw orders for apt on jan 4th   Last labs

## 2014-03-16 NOTE — Telephone Encounter (Signed)
Blood work orders placed in Epic. Patient notified. 

## 2014-03-25 ENCOUNTER — Other Ambulatory Visit: Payer: Self-pay | Admitting: Family Medicine

## 2014-04-03 ENCOUNTER — Ambulatory Visit: Payer: BC Managed Care – PPO | Admitting: Family Medicine

## 2014-04-07 LAB — LIPID PANEL
Cholesterol: 158 mg/dL (ref 0–200)
HDL: 35 mg/dL — ABNORMAL LOW
LDL Cholesterol: 102 mg/dL — ABNORMAL HIGH (ref 0–99)
Total CHOL/HDL Ratio: 4.5 ratio
Triglycerides: 106 mg/dL
VLDL: 21 mg/dL (ref 0–40)

## 2014-04-07 LAB — BASIC METABOLIC PANEL WITH GFR
BUN: 16 mg/dL (ref 6–23)
CO2: 27 meq/L (ref 19–32)
Calcium: 9.2 mg/dL (ref 8.4–10.5)
Chloride: 106 meq/L (ref 96–112)
Creat: 0.95 mg/dL (ref 0.50–1.35)
Glucose, Bld: 90 mg/dL (ref 70–99)
Potassium: 4.6 meq/L (ref 3.5–5.3)
Sodium: 142 meq/L (ref 135–145)

## 2014-04-07 LAB — HEPATIC FUNCTION PANEL
ALK PHOS: 91 U/L (ref 39–117)
ALT: 22 U/L (ref 0–53)
AST: 19 U/L (ref 0–37)
Albumin: 4 g/dL (ref 3.5–5.2)
BILIRUBIN TOTAL: 0.4 mg/dL (ref 0.2–1.2)
Bilirubin, Direct: 0.1 mg/dL (ref 0.0–0.3)
Indirect Bilirubin: 0.3 mg/dL (ref 0.2–1.2)
TOTAL PROTEIN: 6.4 g/dL (ref 6.0–8.3)

## 2014-04-08 LAB — PSA: PSA: 0.55 ng/mL (ref <=4.00)

## 2014-04-10 ENCOUNTER — Ambulatory Visit (INDEPENDENT_AMBULATORY_CARE_PROVIDER_SITE_OTHER): Payer: BLUE CROSS/BLUE SHIELD | Admitting: Family Medicine

## 2014-04-10 ENCOUNTER — Encounter: Payer: Self-pay | Admitting: Family Medicine

## 2014-04-10 VITALS — BP 138/86 | Ht 70.0 in | Wt 228.0 lb

## 2014-04-10 DIAGNOSIS — J209 Acute bronchitis, unspecified: Secondary | ICD-10-CM

## 2014-04-10 DIAGNOSIS — E785 Hyperlipidemia, unspecified: Secondary | ICD-10-CM

## 2014-04-10 DIAGNOSIS — Z0000 Encounter for general adult medical examination without abnormal findings: Secondary | ICD-10-CM

## 2014-04-10 DIAGNOSIS — N400 Enlarged prostate without lower urinary tract symptoms: Secondary | ICD-10-CM

## 2014-04-10 MED ORDER — OMEPRAZOLE 40 MG PO CPDR: 40.0000 mg | DELAYED_RELEASE_CAPSULE | Freq: Every day | ORAL | Status: DC

## 2014-04-10 MED ORDER — TAMSULOSIN HCL 0.4 MG PO CAPS: 0.4000 mg | ORAL_CAPSULE | Freq: Every evening | ORAL | Status: DC | PRN

## 2014-04-10 MED ORDER — SIMVASTATIN 20 MG PO TABS: 20.0000 mg | ORAL_TABLET | Freq: Every day | ORAL | Status: DC

## 2014-04-10 MED ORDER — AZITHROMYCIN 250 MG PO TABS: ORAL_TABLET | ORAL | Status: DC

## 2014-04-10 MED ORDER — LEVOCETIRIZINE DIHYDROCHLORIDE 5 MG PO TABS: ORAL_TABLET | ORAL | Status: DC

## 2014-04-10 MED ORDER — CITALOPRAM HYDROBROMIDE 20 MG PO TABS: 20.0000 mg | ORAL_TABLET | Freq: Every day | ORAL | Status: DC

## 2014-04-10 NOTE — Progress Notes (Signed)
Subjective:    Patient ID: Jon Jackson, male    DOB: 23-Dec-1950, 64 y.o.   MRN: [id number]  Hyperlipidemia This is a chronic problem. The current episode started more than 1 year ago. Pertinent negatives include no chest pain. Compliance problems include adherence to exercise and adherence to diet.   Had bloodwork done on Jan 8th.  No concerns today.   Results for orders placed or performed in visit on 03/15/14  Lipid panel  Result Value Ref Range   Cholesterol 158 0 - 200 mg/dL   Triglycerides 106 <150 mg/dL   HDL 35 (L) >39 mg/dL   Total CHOL/HDL Ratio 4.5 Ratio   VLDL 21 0 - 40 mg/dL   LDL Cholesterol 102 (H) 0 - 99 mg/dL  Hepatic function panel  Result Value Ref Range   Total Bilirubin 0.4 0.2 - 1.2 mg/dL   Bilirubin, Direct 0.1 0.0 - 0.3 mg/dL   Indirect Bilirubin 0.3 0.2 - 1.2 mg/dL   Alkaline Phosphatase 91 39 - 117 U/L   AST 19 0 - 37 U/L   ALT 22 0 - 53 U/L   Total Protein 6.4 6.0 - 8.3 g/dL   Albumin 4.0 3.5 - 5.2 g/dL  PSA  Result Value Ref Range   PSA 0.55 <=4.00 ng/mL  Basic metabolic panel  Result Value Ref Range   Sodium 142 135 - 145 mEq/L   Potassium 4.6 3.5 - 5.3 mEq/L   Chloride 106 96 - 112 mEq/L   CO2 27 19 - 32 mEq/L   Glucose, Bld 90 70 - 99 mg/dL   BUN 16 6 - 23 mg/dL   Creat 0.95 0.50 - 1.35 mg/dL   Calcium 9.2 8.4 - 10.5 mg/dL   Exercise is so so these days, not doing much lately  Bronchial cough, productive, still hanging on .  Urinating bdeter at night, usdes flomax on more of a prn basis,  sticks with celexa definitlely helps  Also here for wellness exam. Up-to-date on colonoscopy.  Trying to watch his diet.  Overall improved energy level.   Still experiencing urination issues, gets up frequently at nighttime. Notes the Flomax is definitely helps.  Cough is productive at times. Review of Systems  Constitutional: Negative for fever, activity change and appetite change.  HENT: Negative for congestion and rhinorrhea.     Eyes: Negative for discharge.  Respiratory: Negative for cough and wheezing.        Deep bronchial cough productive yellowish phlegm  Cardiovascular: Negative for chest pain.  Gastrointestinal: Negative for vomiting, abdominal pain and blood in stool.  Genitourinary: Negative for frequency and difficulty urinating.  Musculoskeletal: Negative for neck pain.  Skin: Negative for rash.  Allergic/Immunologic: Negative for environmental allergies and food allergies.  Neurological: Negative for weakness and headaches.  Psychiatric/Behavioral: Negative for agitation.  All other systems reviewed and are negative.      Objective:   Physical Exam  Constitutional: He appears well-developed and well-nourished.  HENT:  Head: Normocephalic and atraumatic.  Right Ear: External ear normal.  Left Ear: External ear normal.  Nose: Nose normal.  Mouth/Throat: Oropharynx is clear and moist.  Eyes: EOM are normal. Pupils are equal, round, and reactive to light.  Neck: Normal range of motion. Neck supple. No thyromegaly present.  Cardiovascular: Normal rate, regular rhythm and normal heart sounds.   No murmur heard. Pulmonary/Chest: Effort normal and breath sounds normal. No respiratory distress. He has no wheezes.  Abdominal: Soft. Bowel sounds are normal. He  exhibits no distension and no mass. There is no tenderness.  Genitourinary: Penis normal.  Prostate mild enlargement no discrete nodules or masses  Musculoskeletal: Normal range of motion. He exhibits no edema.  Lymphadenopathy:    He has no cervical adenopathy.  Neurological: He is alert. He exhibits normal muscle tone.  Skin: Skin is warm and dry. No erythema.  Psychiatric: He has a normal mood and affect. His behavior is normal. Judgment normal.  Vitals reviewed.         Assessment & Plan:  Impression 1 wellness exam #2 bronchitis discussed #3 hyperlipidemia-relatively good control #4 allergic rhinitis #5 prostate her hypertrophy  plan antibiotics prescribed. Symptomatic care discussed. Maintain same dose of chronic medications. Add Z-Pak. Diet exercise discussed encourage. WSL

## 2014-05-19 ENCOUNTER — Other Ambulatory Visit: Payer: Self-pay | Admitting: Family Medicine

## 2014-08-11 ENCOUNTER — Ambulatory Visit (INDEPENDENT_AMBULATORY_CARE_PROVIDER_SITE_OTHER): Payer: BLUE CROSS/BLUE SHIELD | Admitting: Family Medicine

## 2014-08-11 ENCOUNTER — Encounter: Payer: Self-pay | Admitting: Family Medicine

## 2014-08-11 VITALS — BP 140/80 | Ht 70.0 in | Wt 224.4 lb

## 2014-08-11 DIAGNOSIS — M4806 Spinal stenosis, lumbar region: Secondary | ICD-10-CM | POA: Diagnosis not present

## 2014-08-11 DIAGNOSIS — M48061 Spinal stenosis, lumbar region without neurogenic claudication: Secondary | ICD-10-CM | POA: Insufficient documentation

## 2014-08-11 MED ORDER — PREDNISONE 20 MG PO TABS: ORAL_TABLET | ORAL | Status: DC

## 2014-08-11 NOTE — Progress Notes (Signed)
   Subjective:    Patient ID: Jon Jackson, male    DOB: April 14, 1950, 64 y.o.   MRN: [id number]  Leg Pain  The incident occurred more than 1 week ago. The incident occurred at home. There was no injury mechanism. The pain is present in the left leg and right leg. The quality of the pain is described as cramping. The pain is moderate. The pain has been intermittent since onset. He reports no foreign bodies present. Nothing aggravates the symptoms. Treatments tried: exercise  The treatment provided no relief.   Patient states that he has no other concerns at this time.   Mornings the worse   Also bad after sitting for a period of time  Feels bilat and like muscles getting to cramp  Stretching and walking and not helping much  Pos hx of pinched nerve and sciatica, uses an inversion table . Has helped a sciatic a lot.  Of note discomfort hits the morning just literally moments after standing. Both posterior thighs deep ache. Radiates and in the buttocks and down to the calf. Starts to improve with increased ambulation. Then returns with protracted sitting and similar presentation. No change in urinary or bowel habits.  Old records including old MRI reviewed  Review of Systems No headache no chest pain no back pain no abdominal pain no change in bowel habits    Objective:   Physical Exam  Alert vital stable HET normal lungs clear heart rare rhythm spine nontender negative true straight leg raise distal pulses sensation and reflexes all intact      Assessment & Plan:  Impression probable spinal stenosis discussed at length. MRI of 7 years ago revealed an element of it at that time. Positive history of sciatica. Plan trial of prednisone taper. If worsens will need to do MRI. Rationale discussed. No change in urinary or bowel habits per patient. We will see how this is doing in July regular checkup if we do not hear back before [facility]

## 2014-10-05 ENCOUNTER — Other Ambulatory Visit: Payer: Self-pay | Admitting: Family Medicine

## 2014-10-10 ENCOUNTER — Telehealth: Payer: Self-pay | Admitting: Family Medicine

## 2014-10-10 DIAGNOSIS — E785 Hyperlipidemia, unspecified: Secondary | ICD-10-CM

## 2014-10-10 DIAGNOSIS — Z79899 Other long term (current) drug therapy: Secondary | ICD-10-CM

## 2014-10-10 NOTE — Telephone Encounter (Signed)
Lip liv 

## 2014-10-10 NOTE — Telephone Encounter (Signed)
Pt is requesting lab orders to be sent over for his upcoming wellness visit. Last labs per epic were: lipid,hepatic,psa,and bmp on 04/07/14

## 2014-10-10 NOTE — Telephone Encounter (Signed)
bw orders ready. Pt notified on voicemail.  

## 2014-10-20 ENCOUNTER — Telehealth: Payer: Self-pay | Admitting: Family Medicine

## 2014-10-20 NOTE — Telephone Encounter (Signed)
ERROR

## 2014-11-01 ENCOUNTER — Encounter: Payer: BLUE CROSS/BLUE SHIELD | Admitting: Family Medicine

## 2014-11-16 ENCOUNTER — Other Ambulatory Visit: Payer: Self-pay | Admitting: Family Medicine

## 2014-11-16 NOTE — Telephone Encounter (Signed)
Ok plus 3 ref 

## 2014-11-18 ENCOUNTER — Other Ambulatory Visit: Payer: Self-pay | Admitting: Family Medicine

## 2014-11-19 LAB — HEPATIC FUNCTION PANEL
ALK PHOS: 87 U/L (ref 40–115)
ALT: 23 U/L (ref 9–46)
AST: 19 U/L (ref 10–35)
Albumin: 4 g/dL (ref 3.6–5.1)
BILIRUBIN DIRECT: 0.1 mg/dL (ref <=0.2)
BILIRUBIN INDIRECT: 0.5 mg/dL (ref 0.2–1.2)
Total Bilirubin: 0.6 mg/dL (ref 0.2–1.2)
Total Protein: 6.1 g/dL (ref 6.1–8.1)

## 2014-11-19 LAB — LIPID PANEL
CHOLESTEROL: 143 mg/dL (ref 125–200)
HDL: 46 mg/dL (ref >=40)
LDL CALC: 83 mg/dL (ref <130)
TRIGLYCERIDES: 70 mg/dL (ref <150)
Total CHOL/HDL Ratio: 3.1 Ratio (ref <=5.0)
VLDL: 14 mg/dL (ref <30)

## 2014-11-22 ENCOUNTER — Ambulatory Visit (INDEPENDENT_AMBULATORY_CARE_PROVIDER_SITE_OTHER): Payer: BLUE CROSS/BLUE SHIELD | Admitting: Family Medicine

## 2014-11-22 ENCOUNTER — Encounter: Payer: Self-pay | Admitting: Family Medicine

## 2014-11-22 VITALS — BP 130/82 | Ht 70.0 in | Wt 228.4 lb

## 2014-11-22 DIAGNOSIS — J301 Allergic rhinitis due to pollen: Secondary | ICD-10-CM | POA: Diagnosis not present

## 2014-11-22 DIAGNOSIS — N400 Enlarged prostate without lower urinary tract symptoms: Secondary | ICD-10-CM | POA: Diagnosis not present

## 2014-11-22 DIAGNOSIS — K219 Gastro-esophageal reflux disease without esophagitis: Secondary | ICD-10-CM | POA: Diagnosis not present

## 2014-11-22 DIAGNOSIS — E785 Hyperlipidemia, unspecified: Secondary | ICD-10-CM | POA: Diagnosis not present

## 2014-11-22 NOTE — Progress Notes (Signed)
   Subjective:    Patient ID: Jon Jackson, male    DOB: 1950/12/17, 64 y.o.   MRN: [id number] Patient arrives office for several concerns.   Hyperlipidemia This is a chronic problem. The current episode started more than 1 year ago. Treatments tried: zocor. There are no compliance problems.  Risk factors for coronary artery disease include dyslipidemia.    Results for orders placed or performed in visit on 11/18/14  Lipid panel  Result Value Ref Range   Cholesterol 143 125 - 200 mg/dL   Triglycerides 70 <150 mg/dL   HDL 46 >=40 mg/dL   Total CHOL/HDL Ratio 3.1 <=5.0 Ratio   VLDL 14 <30 mg/dL   LDL Cholesterol 83 <130 mg/dL  Hepatic function panel  Result Value Ref Range   Total Bilirubin 0.6 0.2 - 1.2 mg/dL   Bilirubin, Direct 0.1 <=0.2 mg/dL   Indirect Bilirubin 0.5 0.2 - 1.2 mg/dL   Alkaline Phosphatase 87 40 - 115 U/L   AST 19 10 - 35 U/L   ALT 23 9 - 46 U/L   Total Protein 6.1 6.1 - 8.1 g/dL   Albumin 4.0 3.6 - 5.1 g/dL   States has backed off on Flomax morbid when necessary basis. Less difficulty urinating at night. Thought it might be: Been causing a bit of swollen ankles so has held off on it. Overall doing good without it  States reflux clinically stable. States Deplin needs to stay on the medication for it. No obvious side effects.   Compliant medicine for depression or mood. Feels it is still helping him. No obvious side effects. Next  Trying to exercise quite a bit  Review of Systems No chest pain no headache no abdominal pain no change in bowel habits no blood in stool    Objective:   Physical Exam Alert no acute distress. H&T normal. Lungs clear. Heart rare rhythm. Abdomen benign. Ankles without edema. Blood pressure good on repeat       Assessment & Plan:  Impression 1 hyperlipidemia excellent control #2 reflux clinically stable #3 chronic rhinitis stable #4 depression clinically stable #5 prostate hypertrophy stable plan maintain all medications.  Diet exercise discussed. Recheck in 6 months. Wellness exam plus medical problem visit at that time [facility]

## 2014-11-29 ENCOUNTER — Ambulatory Visit (INDEPENDENT_AMBULATORY_CARE_PROVIDER_SITE_OTHER): Payer: BLUE CROSS/BLUE SHIELD | Admitting: Family Medicine

## 2014-11-29 ENCOUNTER — Other Ambulatory Visit: Payer: Self-pay | Admitting: Family Medicine

## 2014-11-29 ENCOUNTER — Encounter: Payer: Self-pay | Admitting: Family Medicine

## 2014-11-29 VITALS — BP 134/90 | Temp 99.0°F | Ht 70.0 in | Wt 228.0 lb

## 2014-11-29 DIAGNOSIS — R21 Rash and other nonspecific skin eruption: Secondary | ICD-10-CM

## 2014-11-29 MED ORDER — DOXYCYCLINE HYCLATE 100 MG PO TABS: 100.0000 mg | ORAL_TABLET | Freq: Two times a day (BID) | ORAL | Status: DC

## 2014-11-29 NOTE — Progress Notes (Signed)
   Subjective:    Patient ID: Jon Jackson, male    DOB: 07-06-1950, 64 y.o.   MRN: [id number]  HPIpulled tick off left side 3 days ago. Rash around site. Having some headache for the past several days. Low grade fever.   Loose stools  Headache diffuse  Temp max 99  Tiny tick present at bite site.  Has developed rash. Nonpruritic nonpainful    Review of Systems No high headache no f major fever    Objective:   Physical Exam  Alert active no acute distress. HEENT normal. Lungs clear. Heart regular in rhythm. Lateral hip annular rash with central clearing at site of bite      Assessment & Plan:  Impression probable bite reaction and not true tickborne illness however with systemic features will cover discussed at length plan Dr. 100 twice a day 10 days. Symptom care discussed. WSL

## 2015-02-19 ENCOUNTER — Other Ambulatory Visit: Payer: Self-pay | Admitting: Family Medicine

## 2015-03-15 ENCOUNTER — Other Ambulatory Visit: Payer: Self-pay | Admitting: Family Medicine

## 2015-05-25 ENCOUNTER — Other Ambulatory Visit: Payer: Self-pay | Admitting: Family Medicine

## 2015-06-18 ENCOUNTER — Telehealth: Payer: Self-pay | Admitting: Family Medicine

## 2015-06-18 MED ORDER — SIMVASTATIN 20 MG PO TABS: ORAL_TABLET | ORAL | Status: DC

## 2015-06-18 MED ORDER — OMEPRAZOLE 40 MG PO CPDR: DELAYED_RELEASE_CAPSULE | ORAL | Status: DC

## 2015-06-18 MED ORDER — CITALOPRAM HYDROBROMIDE 20 MG PO TABS: ORAL_TABLET | ORAL | Status: DC

## 2015-06-18 MED ORDER — LEVOCETIRIZINE DIHYDROCHLORIDE 5 MG PO TABS: 5.0000 mg | ORAL_TABLET | Freq: Every day | ORAL | Status: DC

## 2015-06-18 NOTE — Telephone Encounter (Signed)
Yes three months total

## 2015-06-18 NOTE — Telephone Encounter (Signed)
May we fill the Citalopram?

## 2015-06-18 NOTE — Telephone Encounter (Signed)
Spoke with patient and informed him per Dr.Steve Palouse sent over to [facility] in [place]. Patient verbalized understanding.

## 2015-06-18 NOTE — Telephone Encounter (Signed)
pts switching to [facility]  Out of meds, has appt wed am  Wants new scripts sent today if possible for   Xyzal, citalopram, omeprazole and simvastatin

## 2015-06-20 ENCOUNTER — Ambulatory Visit: Payer: BLUE CROSS/BLUE SHIELD | Admitting: Family Medicine

## 2015-07-14 ENCOUNTER — Other Ambulatory Visit: Payer: Self-pay | Admitting: Family Medicine

## 2015-07-17 ENCOUNTER — Encounter: Payer: Self-pay | Admitting: Family Medicine

## 2015-07-17 ENCOUNTER — Ambulatory Visit (INDEPENDENT_AMBULATORY_CARE_PROVIDER_SITE_OTHER): Payer: Medicare Other | Admitting: Family Medicine

## 2015-07-17 VITALS — BP 130/80 | Ht 70.0 in | Wt 221.5 lb

## 2015-07-17 DIAGNOSIS — E785 Hyperlipidemia, unspecified: Secondary | ICD-10-CM | POA: Diagnosis not present

## 2015-07-17 DIAGNOSIS — F329 Major depressive disorder, single episode, unspecified: Secondary | ICD-10-CM | POA: Diagnosis not present

## 2015-07-17 DIAGNOSIS — Z125 Encounter for screening for malignant neoplasm of prostate: Secondary | ICD-10-CM | POA: Diagnosis not present

## 2015-07-17 DIAGNOSIS — R21 Rash and other nonspecific skin eruption: Secondary | ICD-10-CM | POA: Diagnosis not present

## 2015-07-17 DIAGNOSIS — Z79899 Other long term (current) drug therapy: Secondary | ICD-10-CM | POA: Diagnosis not present

## 2015-07-17 DIAGNOSIS — F32A Depression, unspecified: Secondary | ICD-10-CM

## 2015-07-17 MED ORDER — TRIAMCINOLONE ACETONIDE 0.1 % EX CREA: 1.0000 "application " | TOPICAL_CREAM | Freq: Two times a day (BID) | CUTANEOUS | Status: DC

## 2015-07-17 MED ORDER — CITALOPRAM HYDROBROMIDE 20 MG PO TABS: ORAL_TABLET | ORAL | Status: DC

## 2015-07-17 MED ORDER — TAMSULOSIN HCL 0.4 MG PO CAPS: ORAL_CAPSULE | ORAL | Status: DC

## 2015-07-17 MED ORDER — SIMVASTATIN 20 MG PO TABS: ORAL_TABLET | ORAL | Status: DC

## 2015-07-17 MED ORDER — LEVOCETIRIZINE DIHYDROCHLORIDE 5 MG PO TABS: 5.0000 mg | ORAL_TABLET | Freq: Every day | ORAL | Status: DC

## 2015-07-17 MED ORDER — OMEPRAZOLE 40 MG PO CPDR: DELAYED_RELEASE_CAPSULE | ORAL | Status: DC

## 2015-07-17 NOTE — Progress Notes (Signed)
   Subjective:    Patient ID: Jon Jackson, male    DOB: 03-11-1951, 65 y.o.   MRN: [id number]  Hyperlipidemia This is a chronic problem. The current episode started more than 1 year ago. There are no known factors aggravating his hyperlipidemia. Current antihyperlipidemic treatment includes statins. The current treatment provides moderate improvement of lipids. There are no compliance problems.  There are no known risk factors for coronary artery disease.   compliant with lipid medication. No obvious side effects.   Patient has a rash on his hands and the arch of his left foot. Onset 1 month ago.  Slightly pruritic mostly to scaly. Walking a l every morning, next  Compliant with the Celexa. Stasis. Definitely help in his chronic depression. Also helps distress no obvious side effects. We definitely like to stay on it.   Trying to watch diet more close  Using flonase now with the allergies plus the xysal  , states overall his meds are helping his allergies considerably    Review of Systems No headache, no major weight loss or weight gain, no chest pain no back pain abdominal pain no change in bowel habits complete ROS otherwise negative     Objective:   Physical Exam   alert vitals stable HET Mondays congestion pharynx normal neck supple. Lungs clear. Heart rare rhythm. Blood pressure excellent. Hands and feet mild scaly dermatitis      Assessment & Plan:  Wellness plus chronic to be scheduled in 6 months today #1 depression clinically stable on meds maintain same #2 allergic rhinitis ongoing continue same meds #3 hyperlipidemia status uncertain will check blood work #4 hand dermatitis likely non-fungal discuss plan trial of steroids other meds refilled diet exercise discussed appropriate blood work further recommendations based results WSL

## 2015-07-24 DIAGNOSIS — E785 Hyperlipidemia, unspecified: Secondary | ICD-10-CM | POA: Diagnosis not present

## 2015-07-24 DIAGNOSIS — Z79899 Other long term (current) drug therapy: Secondary | ICD-10-CM | POA: Diagnosis not present

## 2015-07-24 DIAGNOSIS — Z125 Encounter for screening for malignant neoplasm of prostate: Secondary | ICD-10-CM | POA: Diagnosis not present

## 2015-07-25 ENCOUNTER — Encounter: Payer: Self-pay | Admitting: Family Medicine

## 2015-07-25 LAB — HEPATIC FUNCTION PANEL
ALBUMIN: 4.5 g/dL (ref 3.6–4.8)
ALT: 22 IU/L (ref 0–44)
AST: 20 IU/L (ref 0–40)
Alkaline Phosphatase: 95 IU/L (ref 39–117)
BILIRUBIN TOTAL: 0.6 mg/dL (ref 0.0–1.2)
Bilirubin, Direct: 0.15 mg/dL (ref 0.00–0.40)
TOTAL PROTEIN: 6.8 g/dL (ref 6.0–8.5)

## 2015-07-25 LAB — PSA: Prostate Specific Ag, Serum: 0.7 ng/mL (ref 0.0–4.0)

## 2015-07-25 LAB — LIPID PANEL
CHOLESTEROL TOTAL: 173 mg/dL (ref 100–199)
Chol/HDL Ratio: 3.8 ratio units (ref 0.0–5.0)
HDL: 45 mg/dL (ref >39)
LDL CALC: 104 mg/dL — AB (ref 0–99)
Triglycerides: 119 mg/dL (ref 0–149)
VLDL CHOLESTEROL CAL: 24 mg/dL (ref 5–40)

## 2015-07-25 LAB — BASIC METABOLIC PANEL
BUN/Creatinine Ratio: 17 (ref 10–24)
BUN: 17 mg/dL (ref 8–27)
CALCIUM: 9.8 mg/dL (ref 8.6–10.2)
CO2: 22 mmol/L (ref 18–29)
CREATININE: 1.03 mg/dL (ref 0.76–1.27)
Chloride: 102 mmol/L (ref 96–106)
GFR, EST AFRICAN AMERICAN: 88 mL/min/{1.73_m2} (ref >59)
GFR, EST NON AFRICAN AMERICAN: 76 mL/min/{1.73_m2} (ref >59)
Glucose: 106 mg/dL — ABNORMAL HIGH (ref 65–99)
POTASSIUM: 4.7 mmol/L (ref 3.5–5.2)
Sodium: 143 mmol/L (ref 134–144)

## 2015-09-05 DIAGNOSIS — Z85828 Personal history of other malignant neoplasm of skin: Secondary | ICD-10-CM | POA: Diagnosis not present

## 2015-09-05 DIAGNOSIS — L570 Actinic keratosis: Secondary | ICD-10-CM | POA: Diagnosis not present

## 2016-01-30 ENCOUNTER — Ambulatory Visit (HOSPITAL_COMMUNITY)
Admission: RE | Admit: 2016-01-30 | Discharge: 2016-01-30 | Disposition: A | Discharge status: Home / Self Care | Payer: Medicare Other | Source: Ambulatory Visit | Attending: Family Medicine | Admitting: Family Medicine

## 2016-01-30 ENCOUNTER — Ambulatory Visit (INDEPENDENT_AMBULATORY_CARE_PROVIDER_SITE_OTHER): Payer: Medicare Other | Admitting: Family Medicine

## 2016-01-30 ENCOUNTER — Encounter: Payer: Self-pay | Admitting: Family Medicine

## 2016-01-30 VITALS — BP 120/88 | Ht 70.0 in | Wt 222.0 lb

## 2016-01-30 DIAGNOSIS — M25512 Pain in left shoulder: Secondary | ICD-10-CM | POA: Insufficient documentation

## 2016-01-30 DIAGNOSIS — M19012 Primary osteoarthritis, left shoulder: Secondary | ICD-10-CM | POA: Insufficient documentation

## 2016-01-30 DIAGNOSIS — S4992XA Unspecified injury of left shoulder and upper arm, initial encounter: Secondary | ICD-10-CM | POA: Diagnosis not present

## 2016-01-30 NOTE — Progress Notes (Signed)
   Subjective:    Patient ID: Jon Jackson, male    DOB: March 02, 1951, 65 y.o.   MRN: [id number]  Fall  The accident occurred 12 to 24 hours ago. There was no blood loss. The point of impact was the left shoulder. The pain is at a severity of 9/10. The pain is moderate. The symptoms are aggravated by movement. He has tried NSAID for the symptoms. The treatment provided no relief.   Patient states that he has no other concerns at this time.  Patient notes pain and swelling and shoulder. Some difficulty moving  Review of Systems No headache, no major weight loss or weight gain, no chest pain no back pain abdominal pain no change in bowel habits complete ROS otherwise negative     Objective:   Physical Exam Alert vitals stable, NAD. Blood pressure good on repeat. HEENT normal. Lungs clear. Heart regular rate and rhythm. Left shoulder positive swelling anteriorly decent range of motion with substantial discomfort       Assessment & Plan:  Impression fracture versus dislocation versus rotator cuff injury. Sent for urgent x-rays. X-rays negative just shows old arthritis. Symptom care. Plus ibuprofen. Patient declines pain pills. Use sling. If a week from now no substantial improvement return for further

## 2016-02-12 ENCOUNTER — Encounter: Payer: Self-pay | Admitting: Family Medicine

## 2016-02-12 ENCOUNTER — Ambulatory Visit (INDEPENDENT_AMBULATORY_CARE_PROVIDER_SITE_OTHER): Payer: Medicare Other | Admitting: Family Medicine

## 2016-02-12 VITALS — BP 132/82 | Ht 70.0 in | Wt 223.4 lb

## 2016-02-12 DIAGNOSIS — E785 Hyperlipidemia, unspecified: Secondary | ICD-10-CM

## 2016-02-12 DIAGNOSIS — Z125 Encounter for screening for malignant neoplasm of prostate: Secondary | ICD-10-CM | POA: Diagnosis not present

## 2016-02-12 DIAGNOSIS — Z0000 Encounter for general adult medical examination without abnormal findings: Secondary | ICD-10-CM | POA: Diagnosis not present

## 2016-02-12 DIAGNOSIS — Z79899 Other long term (current) drug therapy: Secondary | ICD-10-CM | POA: Diagnosis not present

## 2016-02-12 DIAGNOSIS — R002 Palpitations: Secondary | ICD-10-CM

## 2016-02-12 DIAGNOSIS — I481 Persistent atrial fibrillation: Secondary | ICD-10-CM | POA: Diagnosis not present

## 2016-02-12 DIAGNOSIS — I4819 Other persistent atrial fibrillation: Secondary | ICD-10-CM

## 2016-02-12 DIAGNOSIS — Z00129 Encounter for routine child health examination without abnormal findings: Secondary | ICD-10-CM

## 2016-02-12 DIAGNOSIS — Z23 Encounter for immunization: Secondary | ICD-10-CM | POA: Diagnosis not present

## 2016-02-12 NOTE — Progress Notes (Signed)
Subjective:    Patient ID: Jon Jackson, male    DOB: [birth year], 65 y.o.   MRN: [id number]  HPI The patient comes in today for a wellness visit.  Results for orders placed or performed in visit on 07/17/15  Lipid panel  Result Value Ref Range   Cholesterol, Total 173 100 - 199 mg/dL   Triglycerides 119 0 - 149 mg/dL   HDL 45 >39 mg/dL   VLDL Cholesterol Cal 24 5 - 40 mg/dL   LDL Calculated 104 (H) 0 - 99 mg/dL   Chol/HDL Ratio 3.8 0.0 - 5.0 ratio units  Hepatic function panel  Result Value Ref Range   Total Protein 6.8 6.0 - 8.5 g/dL   Albumin 4.5 3.6 - 4.8 g/dL   Bilirubin Total 0.6 0.0 - 1.2 mg/dL   Bilirubin, Direct 0.15 0.00 - 0.40 mg/dL   Alkaline Phosphatase 95 39 - 117 IU/L   AST 20 0 - 40 IU/L   ALT 22 0 - 44 IU/L  Basic metabolic panel  Result Value Ref Range   Glucose 106 (H) 65 - 99 mg/dL   BUN 17 8 - 27 mg/dL   Creatinine, Ser 1.03 0.76 - 1.27 mg/dL   GFR calc non Af Amer 76 >59 mL/min/1.73   GFR calc Af Amer 88 >59 mL/min/1.73   BUN/Creatinine Ratio 17 10 - 24   Sodium 143 134 - 144 mmol/L   Potassium 4.7 3.5 - 5.2 mmol/L   Chloride 102 96 - 106 mmol/L   CO2 22 18 - 29 mmol/L   Calcium 9.8 8.6 - 10.2 mg/dL  PSA  Result Value Ref Range   Prostate Specific Ag, Serum 0.7 0.0 - 4.0 ng/mL   Patient continues to take lipid medication regularly. No obvious side effects from it. Generally does not miss a dose. Prior blood work results are reviewed with patient. Patient continues to work on fat intake in diet  Patient notes ongoing compliance with antidepressant medication. No obvious side effects. Reports does not miss a dose. Overall continues to help depression substantially. No thoughts of homicide or suicide. Would like to maintain medication.   A review of their health history was completed.  A review of medications was also completed.  Any needed refills; yes  Eating habits: eating good  Falls/  MVA accidents in past few months: none  Regular  exercise: walking  Specialist pt sees on regular basis: no  Preventative health issues were discussed.   Additional concerns: slipped back into smoking and would like to quit    Review of Systems  Constitutional: Negative.  Negative for activity change, appetite change and fever.  HENT: Negative for congestion and rhinorrhea.   Eyes: Negative for discharge.  Respiratory: Negative for cough and wheezing.   Cardiovascular: Negative for chest pain.  Gastrointestinal: Negative for abdominal pain, blood in stool and vomiting.  Genitourinary: Negative for difficulty urinating and frequency.  Musculoskeletal: Negative for neck pain.  Skin: Negative for rash.  Allergic/Immunologic: Negative for environmental allergies and food allergies.  Neurological: Negative for weakness and headaches.  Psychiatric/Behavioral: Negative for agitation.  All other systems reviewed and are negative.      Objective:   Physical Exam  Constitutional: He appears well-developed and well-nourished.  HENT:  Head: Normocephalic and atraumatic.  Right Ear: External ear normal.  Left Ear: External ear normal.  Nose: Nose normal.  Mouth/Throat: Oropharynx is clear and moist.  Eyes: EOM are normal. Pupils are equal, round, and reactive to  light.  Neck: Normal range of motion. Neck supple. No thyromegaly present.  Cardiovascular: Normal rate, regular rhythm and normal heart sounds.   No murmur heard. Heart tachycardia noted irregular rhythm noted  Pulmonary/Chest: Effort normal and breath sounds normal. No respiratory distress. He has no wheezes.  Abdominal: Soft. Bowel sounds are normal. He exhibits no distension and no mass. There is no tenderness.  Genitourinary: Penis normal.  Musculoskeletal: Normal range of motion. He exhibits no edema.  Lymphadenopathy:    He has no cervical adenopathy.  Neurological: He is alert. He exhibits normal muscle tone.  Skin: Skin is warm and dry. No erythema.    Psychiatric: He has a normal mood and affect. His behavior is normal. Judgment normal.  Vitals reviewed.   EKG probable atrial fibrillation with increased heart rate no true P waves seen.      Assessment & Plan:  Impression 1 wellness exam up-to-date on colonoscopy. In need of vaccines. Diet exercise discussed. #2 atrial fibrillation very long discussion held. Regarding the nature of this condition. Patient to maintain aspirin now. With heart rate only slightly up will await cardiologist's input as far as anticoagulation and additional medications. Patient already taking 04/03/2023 milligram aspirin No. 3 hyperlipidemia status uncertain need to do blood work discussed #4 depression clinically stable to maintain same blood work. Pneumonia shot today. Flu shot today.

## 2016-02-12 NOTE — Patient Instructions (Signed)
Jon Jackson has atrial fibrillation  It sounds bad, but actually pretty common and as long as we treat it properly, things will be ok  This heart thytm can increase the risk of stroke, which means we need to consider how much to thin the blood to help avoid that  Whenever we first diagnose a fi, we have the patient see a cardiologist  Please read the educational information that I will include today  Our heart signal normally start at the top of the heart, and travel thru a channel to the rest of the heart  In atrial fibrillation, that normal conduct of the electical thythm gets disrupted somewho, around age 65 all of [place] are at risk of converting to a fib at a rate of 1 per cent a year or so  Sometimes this spontaneously disappears, but usaully not  The heart docs will do a couple of tests, including an echocardiogram  Atrial Fibrillation Atrial fibrillation is a type of irregular or rapid heartbeat (arrhythmia). In atrial fibrillation, the heart quivers continuously in a chaotic pattern. This occurs when parts of the heart receive disorganized signals that make the heart unable to pump blood normally. This can increase the risk for stroke, heart failure, and other heart-related conditions. There are different types of atrial fibrillation, including:  Paroxysmal atrial fibrillation. This type starts suddenly, and it usually stops on its own shortly after it starts.  Persistent atrial fibrillation. This type often lasts longer than a week. It may stop on its own or with treatment.  Long-lasting persistent atrial fibrillation. This type lasts longer than 12 months.  Permanent atrial fibrillation. This type does not go away. Talk with your health care provider to learn about the type of atrial fibrillation that you have. What are the causes? This condition is caused by some heart-related conditions or procedures, including:  A heart attack.  Coronary artery disease.  Heart  failure.  Heart valve conditions.  High blood pressure.  Inflammation of the sac that surrounds the heart (pericarditis).  Heart surgery.  Certain heart rhythm disorders, such as Wolf-Parkinson-White syndrome. Other causes include:  Pneumonia.  Obstructive sleep apnea.  Blockage of an artery in the lungs (pulmonary embolism, or PE).  Lung cancer.  Chronic lung disease.  Thyroid problems, especially if the thyroid is overactive (hyperthyroidism).  Caffeine.  Excessive alcohol use or illegal drug use.  Use of some medicines, including certain decongestants and diet pills. Sometimes, the cause cannot be found. What increases the risk? This condition is more likely to develop in:  People who are older in age.  People who smoke.  People who have diabetes mellitus.  People who are overweight (obese).  Athletes who exercise vigorously. What are the signs or symptoms? Symptoms of this condition include:  A feeling that your heart is beating rapidly or irregularly.  A feeling of discomfort or pain in your chest.  Shortness of breath.  Sudden light-headedness or weakness.  Getting tired easily during exercise. In some cases, there are no symptoms. How is this diagnosed? Your health care provider may be able to detect atrial fibrillation when taking your pulse. If detected, this condition may be diagnosed with:  An electrocardiogram (ECG).  A Holter monitor test that records your heartbeat patterns over a 24-hour period.  Transthoracic echocardiogram (TTE) to evaluate how blood flows through your heart.  Transesophageal echocardiogram (TEE) to view more detailed images of your heart.  A stress test.  Imaging tests, such as a CT scan or  chest X-ray.  Blood tests. How is this treated? The main goals of treatment are to prevent blood clots from forming and to keep your heart beating at a normal rate and rhythm. The type of treatment that you receive depends  on many factors, such as your underlying medical conditions and how you feel when you are experiencing atrial fibrillation. This condition may be treated with:  Medicine to slow down the heart rate, bring the heart's rhythm back to normal, or prevent clots from forming.  Electrical cardioversion. This is a procedure that resets your heart's rhythm by delivering a controlled, low-energy shock to the heart through your skin.  Different types of ablation, such as catheter ablation, catheter ablation with pacemaker, or surgical ablation. These procedures destroy the heart tissues that send abnormal signals. When the pacemaker is used, it is placed under your skin to help your heart beat in a regular rhythm. Follow these instructions at home:  Take over-the counter and prescription medicines only as told by your health care provider.  If your health care provider prescribed a blood-thinning medicine (anticoagulant), take it exactly as told. Taking too much blood-thinning medicine can cause bleeding. If you do not take enough blood-thinning medicine, you will not have the protection that you need against stroke and other problems.  Do not use tobacco products, including cigarettes, chewing tobacco, and e-cigarettes. If you need help quitting, ask your health care provider.  If you have obstructive sleep apnea, manage your condition as told by your health care provider.  Do not drink alcohol.  Do not drink beverages that contain caffeine, such as coffee, soda, and tea.  Maintain a healthy weight. Do not use diet pills unless your health care provider approves. Diet pills may make heart problems worse.  Follow diet instructions as told by your health care provider.  Exercise regularly as told by your health care provider.  Keep all follow-up visits as told by your health care provider. This is important. How is this prevented?  Avoid drinking beverages that contain caffeine or  alcohol.  Avoid certain medicines, especially medicines that are used for breathing problems.  Avoid certain herbs and herbal medicines, such as those that contain ephedra or ginseng.  Do not use illegal drugs, such as cocaine and amphetamines.  Do not smoke.  Manage your high blood pressure. Contact a health care provider if:  You notice a change in the rate, rhythm, or strength of your heartbeat.  You are taking an anticoagulant and you notice increased bruising.  You tire more easily when you exercise or exert yourself. Get help right away if:  You have chest pain, abdominal pain, sweating, or weakness.  You feel nauseous.  You notice blood in your vomit, bowel movement, or urine.  You have shortness of breath.  You suddenly have swollen feet and ankles.  You feel dizzy.  You have sudden weakness or numbness of the face, arm, or leg, especially on one side of the body.  You have trouble speaking, trouble understanding, or both (aphasia).  Your face or your eyelid droops on one side. These symptoms may represent a serious problem that is an emergency. Do not wait to see if the symptoms will go away. Get medical help right away. Call your local emergency services (911 in the [place] Do not drive yourself to the hospital.  This information is not intended to replace advice given to you by your health care provider. Make sure you discuss any questions you have  with your health care provider. Document Released: 03/17/2005 Document Revised: 07/25/2015 Document Reviewed: 07/12/2014 Elsevier Interactive Patient Education  2017 [employer].   For now stay on the full 325 mg aspirin  I will also add some blood work oi often do not do, looking for triggers such as thyroid disease  The vast majority of folks with a fib live with the condition without any serious complication, but we have to treat it with respect

## 2016-02-13 ENCOUNTER — Encounter: Payer: Self-pay | Admitting: Family Medicine

## 2016-02-13 DIAGNOSIS — E785 Hyperlipidemia, unspecified: Secondary | ICD-10-CM | POA: Diagnosis not present

## 2016-02-13 DIAGNOSIS — Z125 Encounter for screening for malignant neoplasm of prostate: Secondary | ICD-10-CM | POA: Diagnosis not present

## 2016-02-13 DIAGNOSIS — R002 Palpitations: Secondary | ICD-10-CM | POA: Diagnosis not present

## 2016-02-13 DIAGNOSIS — Z79899 Other long term (current) drug therapy: Secondary | ICD-10-CM | POA: Diagnosis not present

## 2016-02-14 LAB — CBC WITH DIFFERENTIAL/PLATELET
BASOS ABS: 0 10*3/uL (ref 0.0–0.2)
BASOS: 0 %
EOS (ABSOLUTE): 0.5 10*3/uL — AB (ref 0.0–0.4)
Eos: 6 %
HEMOGLOBIN: 15.8 g/dL (ref 12.6–17.7)
Hematocrit: 47.4 % (ref 37.5–51.0)
IMMATURE GRANS (ABS): 0 10*3/uL (ref 0.0–0.1)
Immature Granulocytes: 0 %
LYMPHS ABS: 1.3 10*3/uL (ref 0.7–3.1)
LYMPHS: 16 %
MCH: 31.9 pg (ref 26.6–33.0)
MCHC: 33.3 g/dL (ref 31.5–35.7)
MCV: 96 fL (ref 79–97)
Monocytes Absolute: 0.5 10*3/uL (ref 0.1–0.9)
Monocytes: 6 %
NEUTROS ABS: 5.7 10*3/uL (ref 1.4–7.0)
Neutrophils: 72 %
PLATELETS: 262 10*3/uL (ref 150–379)
RBC: 4.95 x10E6/uL (ref 4.14–5.80)
RDW: 13 % (ref 12.3–15.4)
WBC: 7.9 10*3/uL (ref 3.4–10.8)

## 2016-02-14 LAB — HEPATIC FUNCTION PANEL
ALBUMIN: 4.3 g/dL (ref 3.6–4.8)
ALT: 19 IU/L (ref 0–44)
AST: 18 IU/L (ref 0–40)
Alkaline Phosphatase: 103 IU/L (ref 39–117)
Bilirubin Total: 0.5 mg/dL (ref 0.0–1.2)
Bilirubin, Direct: 0.13 mg/dL (ref 0.00–0.40)
TOTAL PROTEIN: 6.4 g/dL (ref 6.0–8.5)

## 2016-02-14 LAB — BASIC METABOLIC PANEL
BUN/Creatinine Ratio: 13 (ref 10–24)
BUN: 13 mg/dL (ref 8–27)
CALCIUM: 9.4 mg/dL (ref 8.6–10.2)
CO2: 24 mmol/L (ref 18–29)
CREATININE: 0.98 mg/dL (ref 0.76–1.27)
Chloride: 102 mmol/L (ref 96–106)
GFR, EST AFRICAN AMERICAN: 93 mL/min/{1.73_m2} (ref >59)
GFR, EST NON AFRICAN AMERICAN: 81 mL/min/{1.73_m2} (ref >59)
Glucose: 100 mg/dL — ABNORMAL HIGH (ref 65–99)
Potassium: 4.6 mmol/L (ref 3.5–5.2)
Sodium: 141 mmol/L (ref 134–144)

## 2016-02-14 LAB — T4, FREE: FREE T4: 1.16 ng/dL (ref 0.82–1.77)

## 2016-02-14 LAB — LIPID PANEL
CHOLESTEROL TOTAL: 151 mg/dL (ref 100–199)
Chol/HDL Ratio: 3.7 ratio units (ref 0.0–5.0)
HDL: 41 mg/dL (ref >39)
LDL CALC: 89 mg/dL (ref 0–99)
Triglycerides: 103 mg/dL (ref 0–149)
VLDL CHOLESTEROL CAL: 21 mg/dL (ref 5–40)

## 2016-02-14 LAB — TSH: TSH: 1.54 u[IU]/mL (ref 0.450–4.500)

## 2016-02-14 LAB — PSA: Prostate Specific Ag, Serum: 0.7 ng/mL (ref 0.0–4.0)

## 2016-02-17 ENCOUNTER — Encounter: Payer: Self-pay | Admitting: Family Medicine

## 2016-02-26 ENCOUNTER — Encounter: Payer: Self-pay | Admitting: Cardiology

## 2016-02-26 NOTE — Progress Notes (Signed)
Cardiology Office Note  Date: 02/27/2016   ID: Jon Jackson, DOB 05/13/1950, MRN [id number]  PCP: [name], MD  Consulting Cardiologist: [name], MD   Chief Complaint  Patient presents with  . Atrial Fibrillation    History of Present Illness:  Jon Jackson is a 65 y.o. male referred for cardiology consultation by Dr. Wolfgang Jackson. He was noted to be in atrial fibrillation by recent ECG and referred for further discussion.He is here today with his wife. He states that just prior to his recent physical with Dr. Wolfgang Jackson he fell out of a tree trying to move a deer stand, had also been drinking more caffeinated beverages and taking Goody powders. He does not report any sense of palpitations, and it is not entirely clear whether the atrial fibrillation is new or not. He has had no chest pain or unusual shortness of breath. No other major decline in stamina.  CHADSVASC score is 2. Annual risk of stroke on aspirin is approximately 2.3% versus 0.8% on an agent such as Eliquis. We discussed this in detail today.  We discussed various management options for atrial fibrillation. At this point we plan to obtain an echocardiogram and initiate Eliquis with subsequent attempt at cardioversion since this is his first documented episode.  Past Medical History:  Diagnosis Date  . Depression   . Essential hypertension   . GERD (gastroesophageal reflux disease)   . Hyperlipidemia   . 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 COLON SURGERY     20 years ago  . COLONOSCOPY N/A 05/30/2013   Procedure: COLONOSCOPY;  Surgeon: [name], MD;  [facility];  Service: Endoscopy;  Laterality: N/A;  8:45 AM  . HERNIA REPAIR     25 -30 years ago [facility]    Current Outpatient Prescriptions  Medication Sig Dispense Refill  . citalopram (CELEXA) 20 MG tablet TAKE 1 TABLET (20 MG TOTAL) BY MOUTH DAILY. 90 tablet 1  . ibuprofen  (ADVIL,MOTRIN) 200 MG tablet Take 200 mg by mouth every 6 (six) hours as needed.    . Multiple Vitamin (MULTIVITAMIN) tablet Take 1 tablet by mouth daily.    Marland Kitchen omeprazole (PRILOSEC) 40 MG capsule TAKE 1 CAPSULE (40 MG TOTAL) BY MOUTH DAILY. 90 capsule 1  . simvastatin (ZOCOR) 20 MG tablet TAKE 1 TABLET (20 MG TOTAL) BY MOUTH DAILY. 30 tablet 0  . apixaban (ELIQUIS) 5 MG TABS tablet Take 1 tablet (5 mg total) by mouth 2 (two) times daily. 60 tablet 3   No current facility-administered medications for this visit.    Allergies:  Patient has no known allergies.   Social History: The patient  reports that he has been smoking Cigars.  He has never used smokeless tobacco. He reports that he drinks about 6.0 oz of alcohol per week . He reports that he does not use drugs.   Family History: The patient's family history includes Colon cancer in his brother.   ROS:  Please see the history of present illness. Otherwise, complete review of systems is positive for none.  All other systems are reviewed and negative.   Physical Exam: VS:  BP 116/64   Pulse 95   Ht 5' 10.5" (1.791 m)   Wt 218 lb (98.9 kg)   SpO2 97%   BMI 30.84 kg/m , BMI Body mass index is 30.84 kg/m.  Wt Readings from Last 3  Encounters:  02/27/16 218 lb (98.9 kg)  02/12/16 223 lb 6.4 oz (101.3 kg)  01/30/16 222 lb (100.7 kg)    General: Patient appears comfortable at rest. HEENT: Conjunctiva and lids normal, oropharynx clear. Neck: Supple, no elevated JVP or carotid bruits, no thyromegaly. Lungs: Clear to auscultation, nonlabored breathing at rest. Cardiac: Irregularly irregular, no S3 or significant systolic murmur, no pericardial rub. Abdomen: Soft, nontender, bowel sounds present, no guarding or rebound. Extremities: No pitting edema, distal pulses 2+. Skin: Warm and dry. Musculoskeletal: No kyphosis. Neuropsychiatric: Alert and oriented x3, affect grossly appropriate.  ECG: I personally reviewed the tracing from  02/12/2016 which showed atrial fibrillation, heart rate 111 bpm, nonspecific T-wave changes.  Recent Labwork: 02/13/2016: ALT 19; AST 18; BUN 13; Creatinine, Ser 0.98; Platelets 262; Potassium 4.6; Sodium 141; TSH 1.540     Component Value Date/Time   CHOL 151 02/13/2016 0918   TRIG 103 02/13/2016 0918   HDL 41 02/13/2016 0918   CHOLHDL 3.7 02/13/2016 0918   CHOLHDL 3.1 11/18/2014 0846   VLDL 14 11/18/2014 0846   LDLCALC 89 02/13/2016 0918   Assessment and Plan:  1. Persistent atrial fibrillation, possibly recent onset but duration is not certain. He does not report any sense of palpitations or chest pain, no other major changes in stamina. CHADVASC score is 2. At this point our plan will be to obtain an echocardiogram and start him on Eliquis 5 mg twice daily. We will bring him back to the office over the next few weeks and if he remains in atrial fibrillation plan to schedule an elective cardioversion since this is his first documented event. Recent lab work reviewed, TSH normal. Does not appear to require AV nodal blocker at this time, we can reassess.  2. Essential hypertension by history, not on antihypertensives at this point. Blood pressure is normal today.  3. Tobacco use, states that he quit smoking a few weeks ago.  4. Hyperlipidemia, LDL 89 by recent lab work. He is on Zocor.  Current medicines were reviewed with the patient today.   Orders Placed This Encounter  Procedures  . ECHOCARDIOGRAM COMPLETE    Disposition: Follow-up in 2-3 weeks.  Signed, Satira Sark, MD, [facility] 02/27/2016 3:26 PM    [place] [facility] [address] [place], [place] [address] Phone: [phone]; Fax: [phone]

## 2016-02-27 ENCOUNTER — Encounter: Payer: Self-pay | Admitting: Cardiology

## 2016-02-27 ENCOUNTER — Ambulatory Visit (INDEPENDENT_AMBULATORY_CARE_PROVIDER_SITE_OTHER): Payer: Medicare Other | Admitting: Cardiology

## 2016-02-27 VITALS — BP 116/64 | HR 95 | Ht 70.5 in | Wt 218.0 lb

## 2016-02-27 DIAGNOSIS — I481 Persistent atrial fibrillation: Secondary | ICD-10-CM | POA: Diagnosis not present

## 2016-02-27 DIAGNOSIS — I10 Essential (primary) hypertension: Secondary | ICD-10-CM

## 2016-02-27 DIAGNOSIS — I4819 Other persistent atrial fibrillation: Secondary | ICD-10-CM

## 2016-02-27 DIAGNOSIS — E782 Mixed hyperlipidemia: Secondary | ICD-10-CM | POA: Diagnosis not present

## 2016-02-27 DIAGNOSIS — Z720 Tobacco use: Secondary | ICD-10-CM

## 2016-02-27 MED ORDER — APIXABAN 5 MG PO TABS: 5.0000 mg | ORAL_TABLET | Freq: Two times a day (BID) | ORAL | 3 refills | Status: DC

## 2016-02-27 NOTE — Patient Instructions (Signed)
Medication Instructions:  START ELIQUIS 5 MG - TWO TIMES DAILY   Labwork: NONE  Testing/Procedures: Your physician has requested that you have an echocardiogram. Echocardiography is a painless test that uses sound waves to create images of your heart. It provides your doctor with information about the size and shape of your heart and how well your heart's chambers and valves are working. This procedure takes approximately one hour. There are no restrictions for this procedure.    Follow-Up: Your physician recommends that you schedule a follow-up appointment in: 2-3 [place]    Any Other Special Instructions Will Be Listed Below (If Applicable).     If you need a refill on your cardiac medications before your next appointment, please call your pharmacy.

## 2016-03-04 ENCOUNTER — Encounter: Payer: Self-pay | Admitting: Cardiology

## 2016-03-04 ENCOUNTER — Ambulatory Visit (HOSPITAL_COMMUNITY)
Admission: RE | Admit: 2016-03-04 | Discharge: 2016-03-04 | Disposition: A | Discharge status: Home / Self Care | Payer: Medicare Other | Source: Ambulatory Visit | Attending: Cardiology | Admitting: Cardiology

## 2016-03-04 DIAGNOSIS — K219 Gastro-esophageal reflux disease without esophagitis: Secondary | ICD-10-CM | POA: Insufficient documentation

## 2016-03-04 DIAGNOSIS — E785 Hyperlipidemia, unspecified: Secondary | ICD-10-CM | POA: Diagnosis not present

## 2016-03-04 DIAGNOSIS — I481 Persistent atrial fibrillation: Secondary | ICD-10-CM

## 2016-03-04 DIAGNOSIS — I350 Nonrheumatic aortic (valve) stenosis: Secondary | ICD-10-CM | POA: Insufficient documentation

## 2016-03-04 DIAGNOSIS — I4819 Other persistent atrial fibrillation: Secondary | ICD-10-CM

## 2016-03-04 LAB — ECHOCARDIOGRAM COMPLETE
AVLVOTPG: 4 mmHg
CHL CUP MV DEC (S): 303
EWDT: 303 ms
FS: 43 % (ref 28–44)
IV/PV OW: 1.11
LA ID, A-P, ES: 50 mm
LA diam index: 2.23 cm/m2
LA vol: 83.3 mL
LAVOLA4C: 83.8 mL
LAVOLIN: 37.1 mL/m2
LDCA: 4.15 cm2
LEFT ATRIUM END SYS DIAM: 50 mm
LV dias vol index: 26 mL/m2
LV dias vol: 58 mL — AB (ref 62–150)
LVOT SV: 79 mL
LVOT VTI: 19 cm
LVOTD: 23 mm
LVOTPV: 95.9 cm/s
MV Peak grad: 4 mmHg
MVPKEVEL: 101 m/s
PW: 10.1 mm — AB (ref 0.6–1.1)
RV LATERAL S' VELOCITY: 11.1 cm/s
RV TAPSE: 18.9 mm

## 2016-03-04 NOTE — Progress Notes (Signed)
*  PRELIMINARY RESULTS* Echocardiogram 2D Echocardiogram has been performed.  Jon Jackson 03/04/2016, 9:08 AM

## 2016-03-12 DIAGNOSIS — Z85828 Personal history of other malignant neoplasm of skin: Secondary | ICD-10-CM | POA: Diagnosis not present

## 2016-03-12 DIAGNOSIS — L570 Actinic keratosis: Secondary | ICD-10-CM | POA: Diagnosis not present

## 2016-03-12 NOTE — Progress Notes (Signed)
Cardiology Office Note  Date: 03/13/2016   ID: Jon Jackson, DOB [birth year], MRN [id number]  PCP: Jon Hillier, MD  Primary Cardiologist: Jon Lesches, MD   Chief Complaint  Patient presents with  . Atrial Fibrillation    History of Present Illness: Jon Jackson is a 65 y.o. male seen recently in late November. He presents for a routine follow-up visit. No significant change since I recently evaluated him with newly documented atrial fibrillation.  CHADSVASC score is 2. He was initiated on Eliquis at the last visit anticipating possible cardioversion if atrial fibrillation persisted. He has tolerated the medication so far, no bleeding problems.  Echocardiogram obtained recently showed normal LVEF at 55%, also evidence of mild aortic stenosis and mildly ectatic aortic root.  Past Medical History:  Diagnosis Date  . Depression   . Essential hypertension   . GERD (gastroesophageal reflux disease)   . Hyperlipidemia   . 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 COLON SURGERY     20 years ago  . COLONOSCOPY N/A 05/30/2013   Procedure: COLONOSCOPY;  Surgeon: Jon Dolin, MD;  Location[facility];  Service: Endoscopy;  Laterality: N/A;  8:45 AM  . HERNIA REPAIR     25 -30 years ago [facility]    Current Outpatient Prescriptions  Medication Sig Dispense Refill  . apixaban (ELIQUIS) 5 MG TABS tablet Take 1 tablet (5 mg total) by mouth 2 (two) times daily. 60 tablet 3  . citalopram (CELEXA) 20 MG tablet TAKE 1 TABLET (20 MG TOTAL) BY MOUTH DAILY. 90 tablet 1  . ibuprofen (ADVIL,MOTRIN) 200 MG tablet Take 200 mg by mouth every 6 (six) hours as needed.    . Multiple Vitamin (MULTIVITAMIN) tablet Take 1 tablet by mouth daily.    Marland Kitchen omeprazole (PRILOSEC) 40 MG capsule TAKE 1 CAPSULE (40 MG TOTAL) BY MOUTH DAILY. 90 capsule 1  . simvastatin (ZOCOR) 20 MG tablet TAKE 1 TABLET (20 MG TOTAL) BY MOUTH DAILY. 30 tablet 0    No current facility-administered medications for this visit.    Allergies:  Patient has no known allergies.   Social History: The patient  reports that he has been smoking Cigars.  He has never used smokeless tobacco. He reports that he drinks about 6.0 oz of alcohol per week . He reports that he does not use drugs.   ROS:  Please see the history of present illness. Otherwise, complete review of systems is positive for none.  All other systems are reviewed and negative.   Physical Exam: VS:  BP 111/75   Pulse 94   Ht 5\' 10"  (1.778 m)   Wt 221 lb 12.8 oz (100.6 kg)   SpO2 97%   BMI 31.82 kg/m , BMI Body mass index is 31.82 kg/m.  Wt Readings from Last 3 Encounters:  03/13/16 221 lb 12.8 oz (100.6 kg)  02/27/16 218 lb (98.9 kg)  02/12/16 223 lb 6.4 oz (101.3 kg)    General: Patient appears comfortable at rest. HEENT: Conjunctiva and lids normal, oropharynx clear. Neck: Supple, no elevated JVP or carotid bruits, no thyromegaly. Lungs: Clear to auscultation, nonlabored breathing at rest. Cardiac: Irregularly irregular, no S3 or significant systolic murmur, no pericardial rub. Abdomen: Soft, nontender, bowel sounds present, no guarding or rebound. Extremities: No pitting edema, distal pulses 2+. Skin: Warm and dry. Musculoskeletal: No kyphosis. Neuropsychiatric: Alert and oriented x3, affect grossly appropriate.  ECG: I personally reviewed the tracing from 02/12/2016 which showed atrial fibrillation with nonspecific T-wave changes.  Recent Labwork: 02/13/2016: ALT 19; AST 18; BUN 13; Creatinine, Ser 0.98; Platelets 262; Potassium 4.6; Sodium 141; TSH 1.540     Component Value Date/Time   CHOL 151 02/13/2016 0918   TRIG 103 02/13/2016 0918   HDL 41 02/13/2016 0918   CHOLHDL 3.7 02/13/2016 0918   CHOLHDL 3.1 11/18/2014 0846   VLDL 14 11/18/2014 0846   LDLCALC 89 02/13/2016 0918    Other Studies Reviewed Today:  Echocardiogram 03/04/2016: Study Conclusions  - Left  ventricle: The cavity size was normal. Wall thickness was   increased in a pattern of mild LVH. The estimated ejection   fraction was 55%. Wall motion was normal; there were no regional   wall motion abnormalities. The study is not technically   sufficient to allow evaluation of LV diastolic function. - Aortic valve: Trileaflet; moderately calcified leaflets.   Noncoronary cusp mobility was severely restricted. There was mild   stenosis. Aortic valve area approximately 1.5-1.7 cm^2 by   planimetry. - Aortic root: The aortic root was mildly ectatic. - Mitral valve: Calcified annulus. There was trivial regurgitation. - Left atrium: The atrium was mildly dilated. - Right atrium: The atrium was mildly to moderately dilated. - Atrial septum: No defect or patent foramen ovale was identified. - Tricuspid valve: There was trivial regurgitation. - Pulmonary arteries: Systolic pressure could not be accurately   estimated. - Pericardium, extracardiac: There was no pericardial effusion.  Impressions:  - Mild LVH with LVEF approximately 55%. Indeterminate diastolic   function in the setting of atrial fibrillation. Mild left atrial   enlargement. Mildly calcified mitral annulus with trivial mitral   regurgitation. Mild calcific aortic stenosis as outlined above.   Mildly ectatic aortic root. Trivial tricuspid regurgitation.  Assessment and Plan:  1. Persistent atrial fibrillation was transferred score of 2. LVEF is normal range and he is tolerating liquids at 5 mg twice daily. Discussed proceeding with elective cardioversion which will be scheduled in the next few weeks in an attempt to restore sinus rhythm.  2. Aortic stenosis, mild by recent echocardiogram and asymptomatic at this time.  3. Essential hypertension by history, blood pressure is normal today. He is not on any antihypertensives at this time.  4. Hyperlipidemia, on Zocor.  Current medicines were reviewed with the patient  today.  Disposition: Follow-up with me after cardioversion.  Signed, Jon Sark, MD, [facility] 03/13/2016 3:10 PM    [facility] [place], [place], [place] [address] Phone: [phone]; Fax: [phone]

## 2016-03-13 ENCOUNTER — Other Ambulatory Visit: Payer: Self-pay | Admitting: Cardiology

## 2016-03-13 ENCOUNTER — Encounter: Payer: Self-pay | Admitting: *Deleted

## 2016-03-13 ENCOUNTER — Ambulatory Visit (INDEPENDENT_AMBULATORY_CARE_PROVIDER_SITE_OTHER): Payer: Medicare Other | Admitting: Cardiology

## 2016-03-13 ENCOUNTER — Encounter: Payer: Self-pay | Admitting: Cardiology

## 2016-03-13 VITALS — BP 111/75 | HR 94 | Ht 70.0 in | Wt 221.8 lb

## 2016-03-13 DIAGNOSIS — I4819 Other persistent atrial fibrillation: Secondary | ICD-10-CM

## 2016-03-13 DIAGNOSIS — I481 Persistent atrial fibrillation: Secondary | ICD-10-CM

## 2016-03-13 DIAGNOSIS — I350 Nonrheumatic aortic (valve) stenosis: Secondary | ICD-10-CM

## 2016-03-13 DIAGNOSIS — I10 Essential (primary) hypertension: Secondary | ICD-10-CM

## 2016-03-13 DIAGNOSIS — E782 Mixed hyperlipidemia: Secondary | ICD-10-CM

## 2016-03-13 NOTE — Patient Instructions (Signed)
Your physician recommends that you schedule a follow-up appointment END OF January 2018 2-3 WEEKS AFTER YOUR PROCEDURE  Your physician recommends that you continue on your current medications as directed. Please refer to the Current Medication list given to you today.  Your physician has requested that you have a TEE/Cardioversion. During a TEE, sound waves are used to create images of your heart. It provides your doctor with information about the size and shape of your heart and how well your heart's chambers and valves are working. In this test, a transducer is attached to the end of a flexible tube that is guided down you throat and into your esophagus (the tube leading from your mouth to your stomach) to get a more detailed image of your heart. Once the TEE has determined that a blood clot is not present, the cardioversion begins. Electrical Cardioversion uses a jolt of electricity to your heart either through paddles or wired patches attached to your chest. This is a controlled, usually prescheduled, procedure. This procedure is done at the hospital and you are not awake during the procedure. You usually go home the day of the procedure. Please see the instruction sheet given to you today for more information.  Thank you for choosing [facility]!!

## 2016-03-18 ENCOUNTER — Ambulatory Visit: Payer: Medicare Other | Admitting: Cardiology

## 2016-03-27 ENCOUNTER — Ambulatory Visit: Payer: Medicare Other | Admitting: Adult Health

## 2016-04-01 ENCOUNTER — Telehealth: Payer: Self-pay | Admitting: Cardiology

## 2016-04-01 MED ORDER — APIXABAN 5 MG PO TABS: 5.0000 mg | ORAL_TABLET | Freq: Two times a day (BID) | ORAL | 6 refills | Status: DC

## 2016-04-01 NOTE — Telephone Encounter (Signed)
°  1. Which medications need to be refilled? (please list name of each medication and dose if known)  apixaban (ELIQUIS) 5 MG TABS tablet [id number]   2. Which pharmacy/location (including street and city if local pharmacy) is medication to be sent to? [place] [facility]   3. Do they need a 30 day or 90 day supply? 30 day

## 2016-04-01 NOTE — Telephone Encounter (Signed)
Done

## 2016-04-02 ENCOUNTER — Encounter (HOSPITAL_COMMUNITY)
Admission: RE | Admit: 2016-04-02 | Discharge: 2016-04-02 | Disposition: A | Discharge status: Home / Self Care | Payer: Medicare Other | Source: Ambulatory Visit | Attending: Cardiology | Admitting: Cardiology

## 2016-04-02 ENCOUNTER — Telehealth: Payer: Self-pay | Admitting: Family Medicine

## 2016-04-02 ENCOUNTER — Other Ambulatory Visit: Payer: Self-pay | Admitting: *Deleted

## 2016-04-02 ENCOUNTER — Encounter (HOSPITAL_COMMUNITY): Payer: Self-pay

## 2016-04-02 DIAGNOSIS — I4891 Unspecified atrial fibrillation: Secondary | ICD-10-CM

## 2016-04-02 DIAGNOSIS — Z01812 Encounter for preprocedural laboratory examination: Secondary | ICD-10-CM | POA: Diagnosis not present

## 2016-04-02 HISTORY — DX: Cardiac arrhythmia, unspecified: I49.9

## 2016-04-02 HISTORY — DX: Anxiety disorder, unspecified: F41.9

## 2016-04-02 LAB — CBC WITH DIFFERENTIAL/PLATELET
Basophils Absolute: 0 10*3/uL (ref 0.0–0.1)
Basophils Relative: 0 %
EOS PCT: 6 %
Eosinophils Absolute: 0.4 10*3/uL (ref 0.0–0.7)
HEMATOCRIT: 42.4 % (ref 39.0–52.0)
Hemoglobin: 14.5 g/dL (ref 13.0–17.0)
LYMPHS ABS: 1.3 10*3/uL (ref 0.7–4.0)
Lymphocytes Relative: 20 %
MCH: 32.3 pg (ref 26.0–34.0)
MCHC: 34.2 g/dL (ref 30.0–36.0)
MCV: 94.4 fL (ref 78.0–100.0)
MONO ABS: 0.4 10*3/uL (ref 0.1–1.0)
MONOS PCT: 6 %
Neutro Abs: 4.5 10*3/uL (ref 1.7–7.7)
Neutrophils Relative %: 68 %
PLATELETS: 202 10*3/uL (ref 150–400)
RBC: 4.49 MIL/uL (ref 4.22–5.81)
RDW: 11.9 % (ref 11.5–15.5)
WBC: 6.5 10*3/uL (ref 4.0–10.5)

## 2016-04-02 LAB — BASIC METABOLIC PANEL
Anion gap: 7 (ref 5–15)
BUN: 18 mg/dL (ref 6–20)
CHLORIDE: 107 mmol/L (ref 101–111)
CO2: 23 mmol/L (ref 22–32)
Calcium: 8.9 mg/dL (ref 8.9–10.3)
Creatinine, Ser: 1 mg/dL (ref 0.61–1.24)
GFR calc Af Amer: >60 mL/min (ref >60)
GFR calc non Af Amer: >60 mL/min (ref >60)
GLUCOSE: 107 mg/dL — AB (ref 65–99)
POTASSIUM: 3.7 mmol/L (ref 3.5–5.1)
Sodium: 137 mmol/L (ref 135–145)

## 2016-04-02 NOTE — Patient Instructions (Signed)
AEDIN SAUCER  04/02/2016     @PREFPERIOPPHARMACY @   Your procedure is scheduled on  04/07/2016   Report to [facility] at  900 A.M.  Call this number if you have problems the morning of surgery:  [phone]   Remember:  Do not eat food or drink liquids after midnight.  Take these medicines the morning of surgery with A SIP OF WATER  Celexa, prilosec.   Do not wear jewelry, make-up or nail polish.  Do not wear lotions, powders, or perfumes, or deoderant.  Do not shave 48 hours prior to surgery.  Men may shave face and neck.  Do not bring valuables to the hospital.  [facility] is not responsible for any belongings or valuables.  Contacts, dentures or bridgework may not be worn into surgery.  Leave your suitcase in the car.  After surgery it may be brought to your room.  For patients admitted to the hospital, discharge time will be determined by your treatment team.  Patients discharged the day of surgery will not be allowed to drive home.   Name and phone number of your driver:   family Special instructions:  Make sure you take your eliquis and do not miss any doses.  Please read over the following fact sheets that you were given. Anesthesia Post-op Instructions and Care and Recovery After Surgery       Electrical Cardioversion Electrical cardioversion is the delivery of a jolt of electricity to restore a normal rhythm to the heart. A rhythm that is too fast or is not regular keeps the heart from pumping well. In this procedure, sticky patches or metal paddles are placed on the chest to deliver electricity to the heart from a device. This procedure may be done in an emergency if:  There is low or no blood pressure as a result of the heart rhythm.  Normal rhythm must be restored as fast as possible to protect the brain and heart from further damage.  It may save a life. This procedure may also be done for irregular or fast heart rhythms that are not  immediately life-threatening. Tell a health care provider about:  Any allergies you have.  All medicines you are taking, including vitamins, herbs, eye drops, creams, and over-the-counter medicines.  Any problems you or family members have had with anesthetic medicines.  Any blood disorders you have.  Any surgeries you have had.  Any medical conditions you have.  Whether you are pregnant or may be pregnant. What are the risks? Generally, this is a safe procedure. However, problems may occur, including:  Allergic reactions to medicines.  A blood clot that breaks free and travels to other parts of your body.  The possible return of an abnormal heart rhythm within hours or days after the procedure.  Your heart stopping (cardiac arrest). This is rare. What happens before the procedure? Medicines  Your health care provider may have you start taking:  Blood-thinning medicines (anticoagulants) so your blood does not clot as easily.  Medicines may be given to help stabilize your heart rate and rhythm.  Ask your health care provider about changing or stopping your regular medicines. This is especially important if you are taking diabetes medicines or blood thinners. General instructions  Plan to have someone take you home from the hospital or clinic.  If you will be going home right after the procedure, plan to have someone with you  for 24 hours.  Follow instructions from your health care provider about eating or drinking restrictions. What happens during the procedure?  To lower your risk of infection:  Your health care team will wash or sanitize their hands.  Your skin will be washed with soap.  An IV tube will be inserted into one of your veins.  You will be given a medicine to help you relax (sedative).  Sticky patches (electrodes) or metal paddles may be placed on your chest.  An electrical shock will be delivered. The procedure may vary among health care  providers and hospitals. What happens after the procedure?  Your blood pressure, heart rate, breathing rate, and blood oxygen level will be monitored until the medicines you were given have worn off.  Do not drive for 24 hours if you were given a sedative.  Your heart rhythm will be watched to make sure it does not change. This information is not intended to replace advice given to you by your health care provider. Make sure you discuss any questions you have with your health care provider. Document Released: 03/07/2002 Document Revised: 11/14/2015 Document Reviewed: 09/21/2015 Elsevier Interactive Patient Education  2017 [employer].  Electrical Cardioversion, Care After This sheet gives you information about how to care for yourself after your procedure. Your health care provider may also give you more specific instructions. If you have problems or questions, contact your health care provider. What can I expect after the procedure? After the procedure, it is common to have:  Some redness on the skin where the shocks were given. Follow these instructions at home:  Do not drive for 24 hours if you were given a medicine to help you relax (sedative).  Take over-the-counter and prescription medicines only as told by your health care provider.  Ask your health care provider how to check your pulse. Check it often.  Rest for 48 hours after the procedure or as told by your health care provider.  Avoid or limit your caffeine use as told by your health care provider. Contact a health care provider if:  You feel like your heart is beating too quickly or your pulse is not regular.  You have a serious muscle cramp that does not go away. Get help right away if:  You have discomfort in your chest.  You are dizzy or you feel faint.  You have trouble breathing or you are short of breath.  Your speech is slurred.  You have trouble moving an arm or leg on one side of your body.  Your  fingers or toes turn cold or blue. This information is not intended to replace advice given to you by your health care provider. Make sure you discuss any questions you have with your health care provider. Document Released: 01/05/2013 Document Revised: 10/19/2015 Document Reviewed: 09/21/2015 Elsevier Interactive Patient Education  2017 [place] Anesthesia is a term that refers to techniques, procedures, and medicines that help a person stay safe and comfortable during a medical procedure. Monitored anesthesia care, or sedation, is one type of anesthesia. Your anesthesia specialist may recommend sedation if you will be having a procedure that does not require you to be unconscious, such as:  Cataract surgery.  A dental procedure.  A biopsy.  A colonoscopy. During the procedure, you may receive a medicine to help you relax (sedative). There are three levels of sedation:  Mild sedation. At this level, you may feel awake and relaxed. You will be  able to follow directions.  Moderate sedation. At this level, you will be sleepy. You may not remember the procedure.  Deep sedation. At this level, you will be asleep. You will not remember the procedure. The more medicine you are given, the deeper your level of sedation will be. Depending on how you respond to the procedure, the anesthesia specialist may change your level of sedation or the type of anesthesia to fit your needs. An anesthesia specialist will monitor you closely during the procedure. Let your health care provider know about:  Any allergies you have.  All medicines you are taking, including vitamins, herbs, eye drops, creams, and over-the-counter medicines.  Any use of steroids (by mouth or as a cream).  Any problems you or family members have had with sedatives and anesthetic medicines.  Any blood disorders you have.  Any surgeries you have had.  Any medical conditions you have, such as  sleep apnea.  Whether you are pregnant or may be pregnant.  Any use of cigarettes, alcohol, or street drugs. What are the risks? Generally, this is a safe procedure. However, problems may occur, including:  Getting too much medicine (oversedation).  Nausea.  Allergic reaction to medicines.  Trouble breathing. If this happens, a breathing tube may be used to help with breathing. It will be removed when you are awake and breathing on your own.  Heart trouble.  Lung trouble. Before the procedure Staying hydrated  Follow instructions from your health care provider about hydration, which may include:  Up to 2 hours before the procedure - you may continue to drink clear liquids, such as water, clear fruit juice, black coffee, and plain tea. Eating and drinking restrictions  Follow instructions from your health care provider about eating and drinking, which may include:  8 hours before the procedure - stop eating heavy meals or foods such as meat, fried foods, or fatty foods.  6 hours before the procedure - stop eating light meals or foods, such as toast or cereal.  6 hours before the procedure - stop drinking milk or drinks that contain milk.  2 hours before the procedure - stop drinking clear liquids. Medicines  Ask your health care provider about:  Changing or stopping your regular medicines. This is especially important if you are taking diabetes medicines or blood thinners.  Taking medicines such as aspirin and ibuprofen. These medicines can thin your blood. Do not take these medicines before your procedure if your health care provider instructs you not to. Tests and exams  You will have a physical exam.  You may have blood tests done to show:  How well your kidneys and liver are working.  How well your blood can clot.  General instructions  Plan to have someone take you home from the hospital or clinic.  If you will be going home right after the procedure, plan to  have someone with you for 24 hours. What happens during the procedure?  Your blood pressure, heart rate, breathing, level of pain and overall condition will be monitored.  An IV tube will be inserted into one of your veins.  Your anesthesia specialist will give you medicines as needed to keep you comfortable during the procedure. This may mean changing the level of sedation.  The procedure will be performed. After the procedure  Your blood pressure, heart rate, breathing rate, and blood oxygen level will be monitored until the medicines you were given have worn off.  Do not drive for 24 hours  if you received a sedative.  You may:  Feel sleepy, clumsy, or nauseous.  Feel forgetful about what happened after the procedure.  Have a sore throat if you had a breathing tube during the procedure.  Vomit. This information is not intended to replace advice given to you by your health care provider. Make sure you discuss any questions you have with your health care provider. Document Released: 12/11/2004 Document Revised: 08/24/2015 Document Reviewed: 07/08/2015 Elsevier Interactive Patient Education  2017 [place] Anesthesia is a term that refers to techniques, procedures, and medicines that help a person stay safe and comfortable during a medical procedure. Monitored anesthesia care, or sedation, is one type of anesthesia. Your anesthesia specialist may recommend sedation if you will be having a procedure that does not require you to be unconscious, such as:  Cataract surgery.  A dental procedure.  A biopsy.  A colonoscopy. During the procedure, you may receive a medicine to help you relax (sedative). There are three levels of sedation:  Mild sedation. At this level, you may feel awake and relaxed. You will be able to follow directions.  Moderate sedation. At this level, you will be sleepy. You may not remember the procedure.  Deep sedation. At  this level, you will be asleep. You will not remember the procedure. The more medicine you are given, the deeper your level of sedation will be. Depending on how you respond to the procedure, the anesthesia specialist may change your level of sedation or the type of anesthesia to fit your needs. An anesthesia specialist will monitor you closely during the procedure. Let your health care provider know about:  Any allergies you have.  All medicines you are taking, including vitamins, herbs, eye drops, creams, and over-the-counter medicines.  Any use of steroids (by mouth or as a cream).  Any problems you or family members have had with sedatives and anesthetic medicines.  Any blood disorders you have.  Any surgeries you have had.  Any medical conditions you have, such as sleep apnea.  Whether you are pregnant or may be pregnant.  Any use of cigarettes, alcohol, or street drugs. What are the risks? Generally, this is a safe procedure. However, problems may occur, including:  Getting too much medicine (oversedation).  Nausea.  Allergic reaction to medicines.  Trouble breathing. If this happens, a breathing tube may be used to help with breathing. It will be removed when you are awake and breathing on your own.  Heart trouble.  Lung trouble. Before the procedure Staying hydrated  Follow instructions from your health care provider about hydration, which may include:  Up to 2 hours before the procedure - you may continue to drink clear liquids, such as water, clear fruit juice, black coffee, and plain tea. Eating and drinking restrictions  Follow instructions from your health care provider about eating and drinking, which may include:  8 hours before the procedure - stop eating heavy meals or foods such as meat, fried foods, or fatty foods.  6 hours before the procedure - stop eating light meals or foods, such as toast or cereal.  6 hours before the procedure - stop drinking  milk or drinks that contain milk.  2 hours before the procedure - stop drinking clear liquids. Medicines  Ask your health care provider about:  Changing or stopping your regular medicines. This is especially important if you are taking diabetes medicines or blood thinners.  Taking medicines such as aspirin and ibuprofen.  These medicines can thin your blood. Do not take these medicines before your procedure if your health care provider instructs you not to. Tests and exams  You will have a physical exam.  You may have blood tests done to show:  How well your kidneys and liver are working.  How well your blood can clot.  General instructions  Plan to have someone take you home from the hospital or clinic.  If you will be going home right after the procedure, plan to have someone with you for 24 hours. What happens during the procedure?  Your blood pressure, heart rate, breathing, level of pain and overall condition will be monitored.  An IV tube will be inserted into one of your veins.  Your anesthesia specialist will give you medicines as needed to keep you comfortable during the procedure. This may mean changing the level of sedation.  The procedure will be performed. After the procedure  Your blood pressure, heart rate, breathing rate, and blood oxygen level will be monitored until the medicines you were given have worn off.  Do not drive for 24 hours if you received a sedative.  You may:  Feel sleepy, clumsy, or nauseous.  Feel forgetful about what happened after the procedure.  Have a sore throat if you had a breathing tube during the procedure.  Vomit. This information is not intended to replace advice given to you by your health care provider. Make sure you discuss any questions you have with your health care provider. Document Released: 12/11/2004 Document Revised: 08/24/2015 Document Reviewed: 07/08/2015 Elsevier Interactive Patient Education  2017 [place] POST-ANESTHESIA  IMMEDIATELY FOLLOWING SURGERY:  Do not drive or operate machinery for the first twenty four hours after surgery.  Do not make any important decisions for twenty four hours after surgery or while taking narcotic pain medications or sedatives.  If you develop intractable nausea and vomiting or a severe headache please notify your doctor immediately.  FOLLOW-UP:  Please make an appointment with your surgeon as instructed. You do not need to follow up with anesthesia unless specifically instructed to do so.  WOUND CARE INSTRUCTIONS (if applicable):  Keep a dry clean dressing on the anesthesia/puncture wound site if there is drainage.  Once the wound has quit draining you may leave it open to air.  Generally you should leave the bandage intact for twenty four hours unless there is drainage.  If the epidural site drains for more than 36-48 hours please call the anesthesia department.  QUESTIONS?:  Please feel free to call your physician or the hospital operator if you have any questions, and they will be happy to assist you.

## 2016-04-02 NOTE — Telephone Encounter (Signed)
Discussed with pt. Pt verbalized understanding. Transferred to front to schedule office visit

## 2016-04-02 NOTE — Telephone Encounter (Signed)
Patient was seen by Dr. Richardson Landry on 01/30/16 for left shoulder pain.  He says he is still having issues with his shoulder and would like to know if we can set him up to do an MRI?

## 2016-04-02 NOTE — Telephone Encounter (Signed)
The insur co have become very resistant to mri's will fequire a follow up vist and utd hx and exam to document at length re for ordering

## 2016-04-04 ENCOUNTER — Ambulatory Visit (INDEPENDENT_AMBULATORY_CARE_PROVIDER_SITE_OTHER): Payer: Medicare Other | Admitting: Family Medicine

## 2016-04-04 ENCOUNTER — Encounter: Payer: Self-pay | Admitting: Family Medicine

## 2016-04-04 VITALS — BP 128/74 | Ht 70.5 in | Wt 221.0 lb

## 2016-04-04 DIAGNOSIS — M7502 Adhesive capsulitis of left shoulder: Secondary | ICD-10-CM | POA: Diagnosis not present

## 2016-04-04 DIAGNOSIS — S46002A Unspecified injury of muscle(s) and tendon(s) of the rotator cuff of left shoulder, initial encounter: Secondary | ICD-10-CM | POA: Diagnosis not present

## 2016-04-04 NOTE — Progress Notes (Signed)
   Subjective:    Patient ID: Jon Jackson, male    DOB: [birth year], 66 y.o.   MRN: [id number]  Shoulder Pain   Pain location: left shoulder. This is a chronic problem. Episode onset: over 2 months after a fall. Treatments tried: exercises, ibuprofen.   Ongoing challenges with left shoulder, See prior notes, fell. Next  X-ray was unremarkable. Next  Patient is suffered significant cardiology concerns since then so has not been tending to shoulder. However ongoing pain now very significant limitation of motion. Patient understandably frustrating in regards this  Pt trying codmans exrcise, not very helpful next  Took anti-inflammatory medicine states 1 skin did not seem to help very much  Feels like   Rot cuff ok      Review of Systems No headache, no major weight loss or weight gain, no chest pain no back pain abdominal pain no change in bowel habits complete ROS otherwise negative     Objective:   Physical Exam Alert vitals stable, NAD. Blood pressure good on repeat. HEENT normal. Lungs clear. Heart regular rate and rhythm. Left shoulder positive impingement sign. Positive inability to completely extend. Positive mild pain in the deltoid region. 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 Plan patient needs an MRI and orthopedic referral. Advised patient at length a great challenge is now dealing with Medicare and getting MRIs approved. Organ impression are to get it approved because he needs one. 25 minutes spent most in discussion regarding options game plan shoulder exercises potential future surgery etc.

## 2016-04-07 ENCOUNTER — Ambulatory Visit (HOSPITAL_COMMUNITY): Payer: Medicare Other | Admitting: Anesthesiology

## 2016-04-07 ENCOUNTER — Encounter (HOSPITAL_COMMUNITY): Payer: Self-pay | Admitting: *Deleted

## 2016-04-07 ENCOUNTER — Ambulatory Visit (HOSPITAL_COMMUNITY)
Admission: RE | Admit: 2016-04-07 | Discharge: 2016-04-07 | Disposition: A | Discharge status: Home / Self Care | Payer: Medicare Other | Source: Ambulatory Visit | Attending: Cardiology | Admitting: Cardiology

## 2016-04-07 ENCOUNTER — Encounter (HOSPITAL_COMMUNITY)
Admission: RE | Disposition: A | Discharge status: Home / Self Care | Payer: Self-pay | Source: Ambulatory Visit | Attending: Cardiology

## 2016-04-07 DIAGNOSIS — I481 Persistent atrial fibrillation: Secondary | ICD-10-CM

## 2016-04-07 DIAGNOSIS — I4819 Other persistent atrial fibrillation: Secondary | ICD-10-CM

## 2016-04-07 DIAGNOSIS — Z85828 Personal history of other malignant neoplasm of skin: Secondary | ICD-10-CM | POA: Insufficient documentation

## 2016-04-07 DIAGNOSIS — E785 Hyperlipidemia, unspecified: Secondary | ICD-10-CM | POA: Diagnosis not present

## 2016-04-07 DIAGNOSIS — F419 Anxiety disorder, unspecified: Secondary | ICD-10-CM | POA: Insufficient documentation

## 2016-04-07 DIAGNOSIS — I10 Essential (primary) hypertension: Secondary | ICD-10-CM | POA: Diagnosis not present

## 2016-04-07 DIAGNOSIS — K219 Gastro-esophageal reflux disease without esophagitis: Secondary | ICD-10-CM | POA: Insufficient documentation

## 2016-04-07 DIAGNOSIS — I4891 Unspecified atrial fibrillation: Secondary | ICD-10-CM | POA: Diagnosis not present

## 2016-04-07 DIAGNOSIS — Z7901 Long term (current) use of anticoagulants: Secondary | ICD-10-CM | POA: Diagnosis not present

## 2016-04-07 DIAGNOSIS — F329 Major depressive disorder, single episode, unspecified: Secondary | ICD-10-CM | POA: Diagnosis not present

## 2016-04-07 DIAGNOSIS — F17290 Nicotine dependence, other tobacco product, uncomplicated: Secondary | ICD-10-CM | POA: Diagnosis not present

## 2016-04-07 DIAGNOSIS — I350 Nonrheumatic aortic (valve) stenosis: Secondary | ICD-10-CM | POA: Diagnosis not present

## 2016-04-07 DIAGNOSIS — Z79899 Other long term (current) drug therapy: Secondary | ICD-10-CM | POA: Insufficient documentation

## 2016-04-07 HISTORY — PX: CARDIOVERSION: SHX1299

## 2016-04-07 SURGERY — CARDIOVERSION
Anesthesia: Monitor Anesthesia Care

## 2016-04-07 MED ORDER — PHENYLEPHRINE 40 MCG/ML (10ML) SYRINGE FOR IV PUSH (FOR BLOOD PRESSURE SUPPORT)
PREFILLED_SYRINGE | INTRAVENOUS | Status: AC
  Filled 2016-04-07: qty 10

## 2016-04-07 MED ORDER — PROPOFOL 10 MG/ML IV BOLUS
INTRAVENOUS | Status: DC | PRN
  Administered 2016-04-07: 10 mg via INTRAVENOUS
  Administered 2016-04-07: 20 mg via INTRAVENOUS

## 2016-04-07 MED ORDER — PROPOFOL 10 MG/ML IV BOLUS
INTRAVENOUS | Status: AC
  Filled 2016-04-07: qty 20

## 2016-04-07 MED ORDER — HYDROCORTISONE 1 % EX CREA
TOPICAL_CREAM | Freq: Once | CUTANEOUS | Status: DC
  Filled 2016-04-07: qty 1.5

## 2016-04-07 MED ORDER — PROPOFOL 500 MG/50ML IV EMUL
INTRAVENOUS | Status: DC | PRN
  Administered 2016-04-07: 25 ug/kg/min via INTRAVENOUS

## 2016-04-07 MED ORDER — MIDAZOLAM HCL 2 MG/2ML IJ SOLN
INTRAMUSCULAR | Status: AC
  Filled 2016-04-07: qty 2

## 2016-04-07 MED ORDER — LIDOCAINE HCL (PF) 1 % IJ SOLN
INTRAMUSCULAR | Status: AC
  Filled 2016-04-07: qty 5

## 2016-04-07 MED ORDER — FENTANYL CITRATE (PF) 100 MCG/2ML IJ SOLN
INTRAMUSCULAR | Status: AC
  Filled 2016-04-07: qty 2

## 2016-04-07 MED ORDER — LACTATED RINGERS IV SOLN
INTRAVENOUS | Status: DC
  Administered 2016-04-07: 1000 mL via INTRAVENOUS

## 2016-04-07 NOTE — Transfer of Care (Signed)
Immediate Anesthesia Transfer of Care Note  Patient: Jon Jackson  Procedure(s) Performed: Procedure(s): CARDIOVERSION (N/A)  Patient Location: PACU  Anesthesia Type:MAC  Level of Consciousness: awake, alert  and oriented  Airway & Oxygen Therapy: Patient Spontanous Breathing  Post-op Assessment: Report given to RN  Post vital signs: Reviewed and stable  Last Vitals:  Vitals:   04/07/16 1010 04/07/16 1015  BP:    Pulse:    Resp: 20 (!) 62  Temp:      Last Pain:  Vitals:   04/07/16 0924  TempSrc: Oral  PainSc: 5       Patients Stated Pain Goal: 8 (98/02/21 7981)  Complications: No apparent anesthesia complications

## 2016-04-07 NOTE — Anesthesia Postprocedure Evaluation (Signed)
Anesthesia Post Note  Patient: Jon Jackson  Procedure(s) Performed: Procedure(s) (LRB): CARDIOVERSION (N/A)  Patient location during evaluation: PACU Anesthesia Type: MAC Level of consciousness: awake and alert and oriented Pain management: pain level controlled Vital Signs Assessment: post-procedure vital signs reviewed and stable Respiratory status: spontaneous breathing Cardiovascular status: stable Postop Assessment: no signs of nausea or vomiting Anesthetic complications: no     Last Vitals:  Vitals:   04/07/16 1010 04/07/16 1015  BP:    Pulse:    Resp: 20 (!) 62  Temp:      Last Pain:  Vitals:   04/07/16 0924  TempSrc: Oral  PainSc: 5                  Hetty Linhart

## 2016-04-07 NOTE — Discharge Instructions (Signed)
Electrical Cardioversion, Care After °This sheet gives you information about how to care for yourself after your procedure. Your health care provider may also give you more specific instructions. If you have problems or questions, contact your health care provider. °What can I expect after the procedure? °After the procedure, it is common to have: °· Some redness on the skin where the shocks were given. °Follow these instructions at home: °· Do not drive for 24 hours if you were given a medicine to help you relax (sedative). °· Take over-the-counter and prescription medicines only as told by your health care provider. °· Ask your health care provider how to check your pulse. Check it often. °· Rest for 48 hours after the procedure or as told by your health care provider. °· Avoid or limit your caffeine use as told by your health care provider. °Contact a health care provider if: °· You feel like your heart is beating too quickly or your pulse is not regular. °· You have a serious muscle cramp that does not go away. °Get help right away if: °· You have discomfort in your chest. °· You are dizzy or you feel faint. °· You have trouble breathing or you are short of breath. °· Your speech is slurred. °· You have trouble moving an arm or leg on one side of your body. °· Your fingers or toes turn cold or blue. °This information is not intended to replace advice given to you by your health care provider. Make sure you discuss any questions you have with your health care provider. °Document Released: 01/05/2013 Document Revised: 10/19/2015 Document Reviewed: 09/21/2015 °Elsevier Interactive Patient Education © 2017 [employer] Inc[employer] ° °

## 2016-04-07 NOTE — Addendum Note (Signed)
Addendum  created 04/07/16 1128 by Ollen Bowl, CRNA   Charge Capture section accepted

## 2016-04-07 NOTE — CV Procedure (Signed)
Elective direct current cardioversion  Indication: Persistent atrial fibrillation  Description of procedure: Labwork and medications reviewed. Informed consent obtained and signed. Patient taken to the PACU where timeout was performed. Anterior and posterior pads were placed and connected to a biphasic defibrillator. Deep sedation was provided with propofol per the Anesthesia team, please refer to their records for details. A single synchronized shock at 120 J was delivered with continued atrial fibrillation noted. A second synchronized 150 J shock was then delivered with continued atrial fibrillation noted. Finally, a third synchronized shock at 200 J was delivered with successful conversion to sinus rhythm. Patient remained hemodynamically stable throughout and tolerated the procedure without obvious complications. Post procedure ECG confirmed normal sinus rhythm.  Satira Sark, M.D., F.A.C.C.

## 2016-04-07 NOTE — Progress Notes (Signed)
Electrical Cardioversion Procedure Note Jon Jackson [id number] 1950/05/10  Procedure: Electrical Cardioversion Indications:  persistent atrial fibrillation  Procedure Details Consent: elective direct- current cardiversion Time Out: Verified patient identification, verified procedure, site/side was marked, verified correct patient position, special equipment/implants available, medications/allergies/relevent history reviewed, required imaging and test results available. Time out 1032.  Patient placed on cardiac monitor, pulse oximetry, supplemental oxygen as necessary.  Sedation given:per anesthsia  Pads placed as indicated by Dr. Domenic Polite MD, anterior and posterior  Cardioverted 3 time(s).  Cardioverted at 1035 with 120 jules, 1036 with 150 jules and then 1037 with 200 jules  Evaluation Findings: Post procedure EKG shows: NSR Complications: none Patient  tolerate procedure well. Hydrocortisone cream appllied to pad site very little pinkness noted.   Jon Jackson R 04/07/2016, 11:02 AM

## 2016-04-07 NOTE — Addendum Note (Signed)
Addendum  created 04/07/16 1056 by Ollen Bowl, CRNA   Anesthesia Intra Flowsheets edited, Anesthesia Intra Meds edited

## 2016-04-07 NOTE — H&P (View-Only) (Signed)
Cardiology Office Note  Date: 03/13/2016   ID: Jon Jackson, DOB [birth year], MRN [id number]  PCP: [name], MD  Primary Cardiologist: [name], MD   Chief Complaint  Patient presents with  . Atrial Fibrillation    History of Present Illness: Jon Jackson is a 66 y.o. male seen recently in late November. He presents for a routine follow-up visit. No significant change since I recently evaluated him with newly documented atrial fibrillation.  CHADSVASC score is 2. He was initiated on Eliquis at the last visit anticipating possible cardioversion if atrial fibrillation persisted. He has tolerated the medication so far, no bleeding problems.  Echocardiogram obtained recently showed normal LVEF at 55%, also evidence of mild aortic stenosis and mildly ectatic aortic root.  Past Medical History:  Diagnosis Date  . Depression   . Essential hypertension   . GERD (gastroesophageal reflux disease)   . Hyperlipidemia   . 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 COLON SURGERY     20 years ago  . COLONOSCOPY N/A 05/30/2013   Procedure: COLONOSCOPY;  Surgeon: [name], MD;  [facility];  Service: Endoscopy;  Laterality: N/A;  8:45 AM  . HERNIA REPAIR     25 -30 years ago [facility]    Current Outpatient Prescriptions  Medication Sig Dispense Refill  . apixaban (ELIQUIS) 5 MG TABS tablet Take 1 tablet (5 mg total) by mouth 2 (two) times daily. 60 tablet 3  . citalopram (CELEXA) 20 MG tablet TAKE 1 TABLET (20 MG TOTAL) BY MOUTH DAILY. 90 tablet 1  . ibuprofen (ADVIL,MOTRIN) 200 MG tablet Take 200 mg by mouth every 6 (six) hours as needed.    . Multiple Vitamin (MULTIVITAMIN) tablet Take 1 tablet by mouth daily.    Marland Kitchen omeprazole (PRILOSEC) 40 MG capsule TAKE 1 CAPSULE (40 MG TOTAL) BY MOUTH DAILY. 90 capsule 1  . simvastatin (ZOCOR) 20 MG tablet TAKE 1 TABLET (20 MG TOTAL) BY MOUTH DAILY. 30 tablet 0    No current facility-administered medications for this visit.    Allergies:  Patient has no known allergies.   Social History: The patient  reports that he has been smoking Cigars.  He has never used smokeless tobacco. He reports that he drinks about 6.0 oz of alcohol per week . He reports that he does not use drugs.   ROS:  Please see the history of present illness. Otherwise, complete review of systems is positive for none.  All other systems are reviewed and negative.   Physical Exam: VS:  BP 111/75   Pulse 94   Ht 5\' 10"  (1.778 m)   Wt 221 lb 12.8 oz (100.6 kg)   SpO2 97%   BMI 31.82 kg/m , BMI Body mass index is 31.82 kg/m.  Wt Readings from Last 3 Encounters:  03/13/16 221 lb 12.8 oz (100.6 kg)  02/27/16 218 lb (98.9 kg)  02/12/16 223 lb 6.4 oz (101.3 kg)    General: Patient appears comfortable at rest. HEENT: Conjunctiva and lids normal, oropharynx clear. Neck: Supple, no elevated JVP or carotid bruits, no thyromegaly. Lungs: Clear to auscultation, nonlabored breathing at rest. Cardiac: Irregularly irregular, no S3 or significant systolic murmur, no pericardial rub. Abdomen: Soft, nontender, bowel sounds present, no guarding or rebound. Extremities: No pitting edema, distal pulses 2+. Skin: Warm and dry. Musculoskeletal: No kyphosis. Neuropsychiatric: Alert and oriented x3, affect grossly appropriate.  ECG: I personally reviewed the tracing from 02/12/2016 which showed atrial fibrillation with nonspecific T-wave changes.  Recent Labwork: 02/13/2016: ALT 19; AST 18; BUN 13; Creatinine, Ser 0.98; Platelets 262; Potassium 4.6; Sodium 141; TSH 1.540     Component Value Date/Time   CHOL 151 02/13/2016 0918   TRIG 103 02/13/2016 0918   HDL 41 02/13/2016 0918   CHOLHDL 3.7 02/13/2016 0918   CHOLHDL 3.1 11/18/2014 0846   VLDL 14 11/18/2014 0846   LDLCALC 89 02/13/2016 0918    Other Studies Reviewed Today:  Echocardiogram 03/04/2016: Study Conclusions  - Left  ventricle: The cavity size was normal. Wall thickness was   increased in a pattern of mild LVH. The estimated ejection   fraction was 55%. Wall motion was normal; there were no regional   wall motion abnormalities. The study is not technically   sufficient to allow evaluation of LV diastolic function. - Aortic valve: Trileaflet; moderately calcified leaflets.   Noncoronary cusp mobility was severely restricted. There was mild   stenosis. Aortic valve area approximately 1.5-1.7 cm^2 by   planimetry. - Aortic root: The aortic root was mildly ectatic. - Mitral valve: Calcified annulus. There was trivial regurgitation. - Left atrium: The atrium was mildly dilated. - Right atrium: The atrium was mildly to moderately dilated. - Atrial septum: No defect or patent foramen ovale was identified. - Tricuspid valve: There was trivial regurgitation. - Pulmonary arteries: Systolic pressure could not be accurately   estimated. - Pericardium, extracardiac: There was no pericardial effusion.  Impressions:  - Mild LVH with LVEF approximately 55%. Indeterminate diastolic   function in the setting of atrial fibrillation. Mild left atrial   enlargement. Mildly calcified mitral annulus with trivial mitral   regurgitation. Mild calcific aortic stenosis as outlined above.   Mildly ectatic aortic root. Trivial tricuspid regurgitation.  Assessment and Plan:  1. Persistent atrial fibrillation was transferred score of 2. LVEF is normal range and he is tolerating liquids at 5 mg twice daily. Discussed proceeding with elective cardioversion which will be scheduled in the next few weeks in an attempt to restore sinus rhythm.  2. Aortic stenosis, mild by recent echocardiogram and asymptomatic at this time.  3. Essential hypertension by history, blood pressure is normal today. He is not on any antihypertensives at this time.  4. Hyperlipidemia, on Zocor.  Current medicines were reviewed with the patient  today.  Disposition: Follow-up with me after cardioversion.  Signed, Satira Sark, MD, [facility] 03/13/2016 3:10 PM    [facility] [place], [place], [place] [address] Phone: [phone]; Fax: [phone]

## 2016-04-07 NOTE — Interval H&P Note (Signed)
History and Physical Interval Note:  04/07/2016 9:37 AM  Patient presents for elective electrical cardioversion due to persistent atrial fibrillation. He has been on Eliquis for stroke prophylaxis. Lab work reviewed.  Jon Jackson

## 2016-04-07 NOTE — Anesthesia Preprocedure Evaluation (Signed)
Anesthesia Evaluation  Patient identified by MRN, date of birth, ID band Patient awake    Reviewed: Allergy & Precautions, NPO status , Patient's Chart, lab work & pertinent test results  Airway Mallampati: II  TM Distance: >3 FB     Dental  (+) Teeth Intact   Pulmonary former smoker,    breath sounds clear to auscultation       Cardiovascular hypertension, Pt. on medications + dysrhythmias Atrial Fibrillation  Rhythm:Regular Rate:Normal     Neuro/Psych PSYCHIATRIC DISORDERS Anxiety Depression    GI/Hepatic GERD  Medicated,  Endo/Other    Renal/GU      Musculoskeletal   Abdominal   Peds  Hematology   Anesthesia Other Findings   Reproductive/Obstetrics                             Anesthesia Physical Anesthesia Plan  ASA: III  Anesthesia Plan: MAC   Post-op Pain Management:    Induction: Intravenous  Airway Management Planned: Simple Face Mask  Additional Equipment:   Intra-op Plan:   Post-operative Plan:   Informed Consent: I have reviewed the patients History and Physical, chart, labs and discussed the procedure including the risks, benefits and alternatives for the proposed anesthesia with the patient or authorized representative who has indicated his/her understanding and acceptance.     Plan Discussed with:   Anesthesia Plan Comments:         Anesthesia Quick Evaluation

## 2016-04-08 ENCOUNTER — Encounter (HOSPITAL_COMMUNITY): Payer: Self-pay | Admitting: Cardiology

## 2016-04-10 ENCOUNTER — Ambulatory Visit (HOSPITAL_COMMUNITY)
Admission: RE | Admit: 2016-04-10 | Discharge: 2016-04-10 | Disposition: A | Discharge status: Home / Self Care | Payer: Medicare Other | Source: Ambulatory Visit | Attending: Family Medicine | Admitting: Family Medicine

## 2016-04-10 DIAGNOSIS — M19012 Primary osteoarthritis, left shoulder: Secondary | ICD-10-CM | POA: Diagnosis not present

## 2016-04-10 DIAGNOSIS — M7502 Adhesive capsulitis of left shoulder: Secondary | ICD-10-CM | POA: Insufficient documentation

## 2016-04-10 DIAGNOSIS — S46012A Strain of muscle(s) and tendon(s) of the rotator cuff of left shoulder, initial encounter: Secondary | ICD-10-CM | POA: Diagnosis not present

## 2016-04-10 DIAGNOSIS — M75122 Complete rotator cuff tear or rupture of left shoulder, not specified as traumatic: Secondary | ICD-10-CM | POA: Diagnosis not present

## 2016-04-10 DIAGNOSIS — S46002A Unspecified injury of muscle(s) and tendon(s) of the rotator cuff of left shoulder, initial encounter: Secondary | ICD-10-CM

## 2016-04-14 ENCOUNTER — Telehealth: Payer: Self-pay | Admitting: Family Medicine

## 2016-04-14 NOTE — Telephone Encounter (Signed)
Requesting results to MRI he completed last week.

## 2016-04-14 NOTE — Telephone Encounter (Signed)
I called pt and let him know, unfortunately full tear

## 2016-04-15 DIAGNOSIS — S46012A Strain of muscle(s) and tendon(s) of the rotator cuff of left shoulder, initial encounter: Secondary | ICD-10-CM | POA: Diagnosis not present

## 2016-04-16 ENCOUNTER — Ambulatory Visit: Payer: Medicare Other | Admitting: Physician Assistant

## 2016-04-18 ENCOUNTER — Encounter: Payer: Self-pay | Admitting: Physician Assistant

## 2016-04-18 ENCOUNTER — Ambulatory Visit (INDEPENDENT_AMBULATORY_CARE_PROVIDER_SITE_OTHER): Payer: Medicare Other | Admitting: Physician Assistant

## 2016-04-18 VITALS — BP 128/70 | HR 63 | Ht 70.5 in | Wt 221.0 lb

## 2016-04-18 DIAGNOSIS — I4891 Unspecified atrial fibrillation: Secondary | ICD-10-CM | POA: Diagnosis not present

## 2016-04-18 DIAGNOSIS — Z01818 Encounter for other preprocedural examination: Secondary | ICD-10-CM | POA: Diagnosis not present

## 2016-04-18 NOTE — Progress Notes (Signed)
Cardiology Office Note    Date:  04/18/2016   ID:  Jon Jackson, DOB 1950-12-09, MRN [id number]  PCP:  [name], MD  Cardiologist: Dr. Domenic Polite  No chief complaint on file.   History of Present Illness:  Jon Jackson is a 66 y.o. male with persistent atrial fibrillation and CHADSVASC=2. Placed on Eliquis 64milligrams twice a day. He had mild aortic stenosis by recent echocardiogram and hypertension. Patient underwent cardioversion 04/07/16 and took 3 shocks to successfully convert him to normal sinus rhythm.  Patient said 5 days after cardioversion he went back in atrial fibrillation. He can feel the irregular heartbeats and some dyspnea. He has not required any rate lowering medications. His main concern is he has complete tear of his left rotator cuff and needs surgery. They are wanting him to come off Eliquis.   Past Medical History:  Diagnosis Date  . Anxiety   . Depression   . Dysrhythmia    AFib  . Essential hypertension   . GERD (gastroesophageal reflux disease)   . Hyperlipidemia   . 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 CARDIOVERSION N/A 04/07/2016   Procedure: CARDIOVERSION;  Surgeon: Satira Sark, MD;  Location: [facility];  Service: Cardiovascular;  Laterality: N/A;  . COLON SURGERY     20 years ago;removal of section due to strangulated bowel.  . COLONOSCOPY N/A 05/30/2013   Procedure: COLONOSCOPY;  Surgeon: [name], MD;  Location: [facility];  Service: Endoscopy;  Laterality: N/A;  8:45 AM  . HERNIA REPAIR     25 -30 years ago [facility];right inguinal  . removal of skin cancer     forhead    Current Medications: Outpatient Medications Prior to Visit  Medication Sig Dispense Refill  . apixaban (ELIQUIS) 5 MG TABS tablet Take 1 tablet (5 mg total) by mouth 2 (two) times daily. 60 tablet 6  . citalopram (CELEXA) 20 MG tablet TAKE 1 TABLET (20 MG TOTAL) BY MOUTH DAILY. 90 tablet 1  .  ibuprofen (ADVIL,MOTRIN) 200 MG tablet Take 200 mg by mouth every 6 (six) hours as needed.    . Misc Natural Products (OSTEO BI-FLEX JOINT SHIELD PO) Take 1 tablet by mouth daily.    . Multiple Vitamin (MULTIVITAMIN) tablet Take 1 tablet by mouth daily.    Marland Kitchen omeprazole (PRILOSEC) 40 MG capsule TAKE 1 CAPSULE (40 MG TOTAL) BY MOUTH DAILY. 90 capsule 1  . simvastatin (ZOCOR) 20 MG tablet TAKE 1 TABLET (20 MG TOTAL) BY MOUTH DAILY. 30 tablet 0   No facility-administered medications prior to visit.      Allergies:   Patient has no known allergies.   Social History   Social History  . Marital status: Married    Spouse name: N/A  . Number of children: N/A  . Years of education: N/A   Social History Main Topics  . Smoking status: Former Smoker    Packs/day: 1.00    Years: 20.00    Types: Cigars    Quit date: 02/01/2016  . Smokeless tobacco: Never Used  . Alcohol use 6.0 oz/week    10 Cans of beer per week  . Drug use: No  . Sexual activity: Yes   Other Topics Concern  . None   Social History Narrative  . None     Family History:  The patient's   family history includes Colon cancer in his brother.  ROS:   Please see the history of present illness.    Review of Systems  Constitution: Negative.  Cardiovascular: Positive for irregular heartbeat.  Respiratory: Negative.   Endocrine: Negative.   Hematologic/Lymphatic: Negative.   Musculoskeletal: Positive for arthritis and joint pain.  Gastrointestinal: Negative.   Genitourinary: Negative.   Neurological: Negative.    All other systems reviewed and are negative.   PHYSICAL EXAM:   VS:  BP 128/70   Pulse 63   Ht 5' 10.5" (1.791 m)   Wt 221 lb (100.2 kg)   SpO2 96%   BMI 31.26 kg/m   Physical Exam  GEN: Well nourished, well developed, in no acute distress  Neck: no JVD, carotid bruits, or masses Cardiac: Irregular irregular; no murmurs, rubs, or gallops  Respiratory:  clear to auscultation bilaterally, normal work  of breathing GI: soft, nontender, nondistended, + BS Ext: without cyanosis, clubbing, or edema, Good distal pulses bilaterally MS: no deformity or atrophy  Psych: euthymic mood, full affect  Wt Readings from Last 3 Encounters:  04/18/16 221 lb (100.2 kg)  04/07/16 221 lb (100.2 kg)  04/04/16 221 lb (100.2 kg)      Studies/Labs Reviewed:   EKG:  EKG is ordered today.  The ekg ordered today demonstrates Atrial fibrillation at 93 bpm  Recent Labs: 02/13/2016: ALT 19; TSH 1.540 04/02/2016: BUN 18; Creatinine, Ser 1.00; Hemoglobin 14.5; Platelets 202; Potassium 3.7; Sodium 137   Lipid Panel    Component Value Date/Time   CHOL 151 02/13/2016 0918   TRIG 103 02/13/2016 0918   HDL 41 02/13/2016 0918   CHOLHDL 3.7 02/13/2016 0918   CHOLHDL 3.1 11/18/2014 0846   VLDL 14 11/18/2014 0846   LDLCALC 89 02/13/2016 0918    Additional studies/ records that were reviewed today include:  2-D echo 12/5/17Study Conclusions   - Left ventricle: The cavity size was normal. Wall thickness was   increased in a pattern of mild LVH. The estimated ejection   fraction was 55%. Wall motion was normal; there were no regional   wall motion abnormalities. The study is not technically   sufficient to allow evaluation of LV diastolic function. - Aortic valve: Trileaflet; moderately calcified leaflets.   Noncoronary cusp mobility was severely restricted. There was mild   stenosis. Aortic valve area approximately 1.5-1.7 cm^2 by   planimetry. - Aortic root: The aortic root was mildly ectatic. - Mitral valve: Calcified annulus. There was trivial regurgitation. - Left atrium: The atrium was mildly dilated. - Right atrium: The atrium was mildly to moderately dilated. - Atrial septum: No defect or patent foramen ovale was identified. - Tricuspid valve: There was trivial regurgitation. - Pulmonary arteries: Systolic pressure could not be accurately   estimated. - Pericardium, extracardiac: There was no  pericardial effusion.   Impressions:   - Mild LVH with LVEF approximately 55%. Indeterminate diastolic   function in the setting of atrial fibrillation. Mild left atrial   enlargement. Mildly calcified mitral annulus with trivial mitral   regurgitation. Mild calcific aortic stenosis as outlined above.   Mildly ectatic aortic root. Trivial tricuspid regurgitation.       ASSESSMENT:    1. Atrial fibrillation, unspecified type ([place])   2. Preoperative clearance      PLAN:  In order of problems listed above:  Atrial fibrillation status post cardioversion 04/07/16 but is back in atrial fibrillation. He is on Eliquis 5 mg BID. Has not required rate lowering medications. Will hold off on trying to get him  back in normal sinus rhythm and till after his rotator cuff surgery. Follow-up with Dr. Domenic Polite first week in February.  Preoperative clearance for rotator cuff surgery. Discussed this patient with Dr. Domenic Polite who feels the patient can come off Eliquis 24-48 hours prior to the surgery and hopefully get back on it soon after the surgery. Patient has not required rate lowering medications but may need some IV metoprolol during surgery if his heart rate goes fast. Has no history of CAD or symptoms of angina. Remote stress test that was normal. Follow-up with Dr. Domenic Polite after surgery.    Medication Adjustments/Labs and Tests Ordered: Current medicines are reviewed at length with the patient today.  Concerns regarding medicines are outlined above.  Medication changes, Labs and Tests ordered today are listed in the Patient Instructions below. Patient Instructions  Medication Instructions:  Your physician recommends that you continue on your current medications as directed. Please refer to the Current Medication list given to you today.   Labwork: none  Testing/Procedures: none  Follow-Up: Your physician recommends that you schedule a follow-up appointment in: keep follow-up  appointment with Dr. Domenic Polite in [place] 2/1   Any Other Special Instructions Will Be Listed Below (If Applicable).     If you need a refill on your cardiac medications before your next appointment, please call your pharmacy.      Sumner Boast, PA-C  04/18/2016 2:37 PM    [facility] [place], [place], [place]  [address] Phone: [phone]; Fax: [phone]

## 2016-04-18 NOTE — Patient Instructions (Signed)
Medication Instructions:  Your physician recommends that you continue on your current medications as directed. Please refer to the Current Medication list given to you today.   Labwork: none  Testing/Procedures: none  Follow-Up: Your physician recommends that you schedule a follow-up appointment in: keep follow-up appointment with Dr. Domenic Polite in [place] 2/1   Any Other Special Instructions Will Be Listed Below (If Applicable).     If you need a refill on your cardiac medications before your next appointment, please call your pharmacy.

## 2016-04-28 ENCOUNTER — Telehealth: Payer: Self-pay | Admitting: Cardiology

## 2016-04-28 NOTE — Telephone Encounter (Signed)
Per OV note from Gerrianne Scale ,pt should see Dr Domenic Polite after rotator cuff surgery which has not happened yet,pt requested 04/01/16 apt with Dr Domenic Polite to be cancelled

## 2016-04-28 NOTE — Telephone Encounter (Signed)
Patient would like to know if Feb 1 appt with Dr Domenic Polite is still needed for him to obtain his surgical clearance.

## 2016-04-30 ENCOUNTER — Other Ambulatory Visit: Payer: Self-pay | Admitting: Family Medicine

## 2016-05-01 ENCOUNTER — Ambulatory Visit: Payer: Medicare Other | Admitting: Cardiology

## 2016-05-07 NOTE — Pre-Procedure Instructions (Signed)
HABEEB CHAGOLLA  05/07/2016      [id number] - [place], [place] [address] Phone: [phone] Fax: [phone]  [place], [place] - [place] [address] [place] [address] [place] [address] Phone: [phone] Fax: [phone]    Your procedure is scheduled on Thurs, Feb 15 @ 12:00 PM  Report to [facility] Admitting at 10:00 AM  Call this number if you have problems the morning of surgery:  [phone]   Remember:  Do not eat food or drink liquids after midnight.  Take these medicines the morning of surgery with A SIP OF WATER Celexa(Citalopram),Eye Drops,and Omeprazole(Prilosec)              Per Dr.McDowell hold Eliquis 24-48 hrs prior to surgery. Also stop taking any Vitamins or Herbal Medications. No Goody's,BC's,Advil,Motrin,Aleve,Ibuprofen,or Fish Oil.    Do not wear jewelry.  Do not wear lotions, powders,colognes, or deoderant.  Men may shave face and neck.  Do not bring valuables to the hospital.  [facility] is not responsible for any belongings or valuables.  Contacts, dentures or bridgework may not be worn into surgery.  Leave your suitcase in the car.  After surgery it may be brought to your room.  For patients admitted to the hospital, discharge time will be determined by your treatment team.  Patients discharged the day of surgery will not be allowed to drive home.    [facility]  Before surgery, you can play an important role.  Because skin is not sterile, your skin needs to be as free of germs as possible.  You can reduce the number of germs on you skin by washing with CHG (chlorahexidine gluconate) soap before surgery.  CHG is an antiseptic cleaner which kills germs and bonds with the skin to continue killing germs even after washing.  Please DO NOT use if you have an allergy to CHG or  antibacterial soaps.  If your skin becomes reddened/irritated stop using the CHG and inform your nurse when you arrive at Short Stay.  Do not shave (including legs and underarms) for at least 48 hours prior to the first CHG shower.  You may shave your face.  Please follow these instructions carefully:   1.  Shower with CHG Soap the night before surgery and the                                morning of Surgery.  2.  If you choose to wash your hair, wash your hair first as usual with your       normal shampoo.  3.  After you shampoo, rinse your hair and body thoroughly to remove the                      Shampoo.  4.  Use CHG as you would any other liquid soap.  You can apply chg directly       to the skin and wash gently with scrungie or a clean washcloth.  5.  Apply the CHG Soap to your body ONLY FROM THE NECK DOWN.        Do not use on open wounds or open sores.  Avoid contact with your eyes,       ears, mouth  and genitals (private parts).  Wash genitals (private parts)       with your normal soap.  6.  Wash thoroughly, paying special attention to the area where your surgery        will be performed.  7.  Thoroughly rinse your body with warm water from the neck down.  8.  DO NOT shower/wash with your normal soap after using and rinsing off       the CHG Soap.  9.  Pat yourself dry with a clean towel.            10.  Wear clean pajamas.            11.  Place clean sheets on your bed the night of your first shower and do not        sleep with pets.  Day of Surgery  Do not apply any lotions/deoderants the morning of surgery.  Please wear clean clothes to the hospital/surgery center.    Please read over the following fact sheets that you were given. Pain Booklet, Coughing and Deep Breathing and Surgical Site Infection Prevention

## 2016-05-08 ENCOUNTER — Encounter (HOSPITAL_COMMUNITY)
Admission: RE | Admit: 2016-05-08 | Discharge: 2016-05-08 | Disposition: A | Discharge status: Home / Self Care | Payer: Medicare Other | Source: Ambulatory Visit | Attending: Orthopedic Surgery | Admitting: Orthopedic Surgery

## 2016-05-08 ENCOUNTER — Encounter (HOSPITAL_COMMUNITY): Payer: Self-pay

## 2016-05-08 DIAGNOSIS — E785 Hyperlipidemia, unspecified: Secondary | ICD-10-CM | POA: Diagnosis not present

## 2016-05-08 DIAGNOSIS — Z79899 Other long term (current) drug therapy: Secondary | ICD-10-CM | POA: Insufficient documentation

## 2016-05-08 DIAGNOSIS — Z87891 Personal history of nicotine dependence: Secondary | ICD-10-CM | POA: Insufficient documentation

## 2016-05-08 DIAGNOSIS — Z7901 Long term (current) use of anticoagulants: Secondary | ICD-10-CM | POA: Diagnosis not present

## 2016-05-08 DIAGNOSIS — I350 Nonrheumatic aortic (valve) stenosis: Secondary | ICD-10-CM | POA: Diagnosis not present

## 2016-05-08 DIAGNOSIS — I4891 Unspecified atrial fibrillation: Secondary | ICD-10-CM | POA: Diagnosis not present

## 2016-05-08 DIAGNOSIS — Z01812 Encounter for preprocedural laboratory examination: Secondary | ICD-10-CM | POA: Insufficient documentation

## 2016-05-08 DIAGNOSIS — K219 Gastro-esophageal reflux disease without esophagitis: Secondary | ICD-10-CM | POA: Insufficient documentation

## 2016-05-08 HISTORY — DX: Unspecified osteoarthritis, unspecified site: M19.90

## 2016-05-08 LAB — BASIC METABOLIC PANEL
Anion gap: 10 (ref 5–15)
BUN: 20 mg/dL (ref 6–20)
CALCIUM: 9.2 mg/dL (ref 8.9–10.3)
CO2: 23 mmol/L (ref 22–32)
CREATININE: 1.16 mg/dL (ref 0.61–1.24)
Chloride: 106 mmol/L (ref 101–111)
GFR calc Af Amer: >60 mL/min (ref >60)
GFR calc non Af Amer: >60 mL/min (ref >60)
GLUCOSE: 82 mg/dL (ref 65–99)
Potassium: 4 mmol/L (ref 3.5–5.1)
Sodium: 139 mmol/L (ref 135–145)

## 2016-05-08 LAB — CBC
HEMATOCRIT: 43.5 % (ref 39.0–52.0)
Hemoglobin: 14.9 g/dL (ref 13.0–17.0)
MCH: 31.5 pg (ref 26.0–34.0)
MCHC: 34.3 g/dL (ref 30.0–36.0)
MCV: 92 fL (ref 78.0–100.0)
PLATELETS: 204 10*3/uL (ref 150–400)
RBC: 4.73 MIL/uL (ref 4.22–5.81)
RDW: 12.1 % (ref 11.5–15.5)
WBC: 5.9 10*3/uL (ref 4.0–10.5)

## 2016-05-08 LAB — PROTIME-INR
INR: 0.94
Prothrombin Time: 12.5 seconds (ref 11.4–15.2)

## 2016-05-08 NOTE — Progress Notes (Addendum)
PCP is Dr. Cecilie Lowers Cardiologist is Dr. Domenic Polite Denies ever having a card cath. States he had a stress test many years ago. Echo noted from 03-04-16 Denies any chest pain. Cardioversion 04-07-16- Reports he is back in A fib. And that he will have the surgery first then see what to do next about the A.fib Reports his last dose of Eliquis will be 5 days before his surgery.  Denies high blood pressure states that someone marked that once, but the bp cuff was to small and he has not had any problems since.

## 2016-05-09 NOTE — Progress Notes (Signed)
Anesthesia Chart Review:  Pt is a 66 year old male scheduled for L shoulder arthroscopy with rotator cuff repair and subacromial decompression and distal clavicle resection on 05/15/2016 with [name], M.D.  - PCP is Baltazar Apo, MD - Cardiologist is [name], MD, last office visit 04/18/16 with Ermalinda Barrios, PA who cleared pt for surgery.   PMH includes: Atrial fibrillation (s/p cardioversion 04/07/16; by notes does not require rate lowering medication), mild aortic stenosis, hyperlipidemia, GERD. Former smoker (quit 02/01/16). BMI 32.  Medications include: Eliquis, Prilosec, simvastatin. Pt to stop eliquis 24-48 hours prior to surgery.   Preoperative labs reviewed.    EKG 04/18/16: Atrial fibrillation (93 bpm). Low voltage in precordial leads.   Echo 03/04/16:  - Left ventricle: The cavity size was normal. Wall thickness was increased in a pattern of mild LVH. The estimated ejection fraction was 55%. Wall motion was normal; there were no regional wall motion abnormalities. The study is not technically sufficient to allow evaluation of LV diastolic function. - Aortic valve: Trileaflet; moderately calcified leaflets. Noncoronary cusp mobility was severely restricted. There was mild stenosis. Aortic valve area approximately 1.5-1.7 cm^2 by planimetry. - Aortic root: The aortic root was mildly ectatic. - Mitral valve: Calcified annulus. There was trivial regurgitation. - Left atrium: The atrium was mildly dilated. - Right atrium: The atrium was mildly to moderately dilated. - Atrial septum: No defect or patent foramen ovale was identified. - Tricuspid valve: There was trivial regurgitation. - Pulmonary arteries: Systolic pressure could not be accurately estimated. - Pericardium, extracardiac: There was no pericardial effusion.  If no changes, I anticipate pt can proceed with surgery as scheduled.   Willeen Cass, FNP-BC [facility]/Anesthesiology Phone:  [phone] 05/09/2016 3:27 PM

## 2016-05-15 ENCOUNTER — Encounter (HOSPITAL_COMMUNITY): Payer: Self-pay | Admitting: *Deleted

## 2016-05-15 ENCOUNTER — Ambulatory Visit (HOSPITAL_COMMUNITY)
Admission: RE | Admit: 2016-05-15 | Discharge: 2016-05-15 | Disposition: A | Discharge status: Home / Self Care | Payer: Medicare Other | Source: Ambulatory Visit | Attending: Orthopedic Surgery | Admitting: Orthopedic Surgery

## 2016-05-15 ENCOUNTER — Ambulatory Visit (HOSPITAL_COMMUNITY): Payer: Medicare Other | Admitting: Anesthesiology

## 2016-05-15 ENCOUNTER — Ambulatory Visit (HOSPITAL_COMMUNITY): Payer: Medicare Other | Admitting: Emergency Medicine

## 2016-05-15 ENCOUNTER — Encounter (HOSPITAL_COMMUNITY)
Admission: RE | Disposition: A | Discharge status: Home / Self Care | Payer: Self-pay | Source: Ambulatory Visit | Attending: Orthopedic Surgery

## 2016-05-15 DIAGNOSIS — M19012 Primary osteoarthritis, left shoulder: Secondary | ICD-10-CM | POA: Diagnosis not present

## 2016-05-15 DIAGNOSIS — K219 Gastro-esophageal reflux disease without esophagitis: Secondary | ICD-10-CM | POA: Insufficient documentation

## 2016-05-15 DIAGNOSIS — Z87891 Personal history of nicotine dependence: Secondary | ICD-10-CM | POA: Diagnosis not present

## 2016-05-15 DIAGNOSIS — G8918 Other acute postprocedural pain: Secondary | ICD-10-CM | POA: Diagnosis not present

## 2016-05-15 DIAGNOSIS — M7502 Adhesive capsulitis of left shoulder: Secondary | ICD-10-CM | POA: Insufficient documentation

## 2016-05-15 DIAGNOSIS — M75102 Unspecified rotator cuff tear or rupture of left shoulder, not specified as traumatic: Secondary | ICD-10-CM | POA: Diagnosis not present

## 2016-05-15 DIAGNOSIS — W19XXXA Unspecified fall, initial encounter: Secondary | ICD-10-CM | POA: Diagnosis not present

## 2016-05-15 DIAGNOSIS — F419 Anxiety disorder, unspecified: Secondary | ICD-10-CM | POA: Insufficient documentation

## 2016-05-15 DIAGNOSIS — Z7901 Long term (current) use of anticoagulants: Secondary | ICD-10-CM | POA: Insufficient documentation

## 2016-05-15 DIAGNOSIS — M25012 Hemarthrosis, left shoulder: Secondary | ICD-10-CM | POA: Insufficient documentation

## 2016-05-15 DIAGNOSIS — F418 Other specified anxiety disorders: Secondary | ICD-10-CM | POA: Diagnosis not present

## 2016-05-15 DIAGNOSIS — I4891 Unspecified atrial fibrillation: Secondary | ICD-10-CM | POA: Insufficient documentation

## 2016-05-15 DIAGNOSIS — M7542 Impingement syndrome of left shoulder: Secondary | ICD-10-CM | POA: Diagnosis not present

## 2016-05-15 DIAGNOSIS — M94212 Chondromalacia, left shoulder: Secondary | ICD-10-CM | POA: Diagnosis not present

## 2016-05-15 DIAGNOSIS — S43432A Superior glenoid labrum lesion of left shoulder, initial encounter: Secondary | ICD-10-CM | POA: Insufficient documentation

## 2016-05-15 DIAGNOSIS — S46012A Strain of muscle(s) and tendon(s) of the rotator cuff of left shoulder, initial encounter: Secondary | ICD-10-CM | POA: Insufficient documentation

## 2016-05-15 DIAGNOSIS — F329 Major depressive disorder, single episode, unspecified: Secondary | ICD-10-CM | POA: Diagnosis not present

## 2016-05-15 DIAGNOSIS — E785 Hyperlipidemia, unspecified: Secondary | ICD-10-CM | POA: Diagnosis not present

## 2016-05-15 DIAGNOSIS — Z85828 Personal history of other malignant neoplasm of skin: Secondary | ICD-10-CM | POA: Diagnosis not present

## 2016-05-15 DIAGNOSIS — S46092A Other injury of muscle(s) and tendon(s) of the rotator cuff of left shoulder, initial encounter: Secondary | ICD-10-CM | POA: Diagnosis not present

## 2016-05-15 HISTORY — PX: SHOULDER ARTHROSCOPY WITH ROTATOR CUFF REPAIR AND SUBACROMIAL DECOMPRESSION: SHX5686

## 2016-05-15 SURGERY — SHOULDER ARTHROSCOPY WITH ROTATOR CUFF REPAIR AND SUBACROMIAL DECOMPRESSION
Anesthesia: General | Site: Shoulder | Laterality: Left

## 2016-05-15 MED ORDER — LIDOCAINE HCL (CARDIAC) 20 MG/ML IV SOLN
INTRAVENOUS | Status: DC | PRN
  Administered 2016-05-15: 50 mg via INTRATRACHEAL

## 2016-05-15 MED ORDER — FENTANYL CITRATE (PF) 100 MCG/2ML IJ SOLN
INTRAMUSCULAR | Status: DC | PRN
  Administered 2016-05-15: 100 ug via INTRAVENOUS

## 2016-05-15 MED ORDER — MIDAZOLAM HCL 2 MG/2ML IJ SOLN
1.0000 mg | INTRAMUSCULAR | Status: DC | PRN
  Administered 2016-05-15: 2 mg via INTRAVENOUS

## 2016-05-15 MED ORDER — PROPOFOL 10 MG/ML IV BOLUS
INTRAVENOUS | Status: AC
  Filled 2016-05-15: qty 20

## 2016-05-15 MED ORDER — ROCURONIUM BROMIDE 100 MG/10ML IV SOLN
INTRAVENOUS | Status: DC | PRN
  Administered 2016-05-15: 50 mg via INTRAVENOUS

## 2016-05-15 MED ORDER — PHENYLEPHRINE HCL 10 MG/ML IJ SOLN
INTRAVENOUS | Status: DC | PRN
  Administered 2016-05-15: 25 ug/min via INTRAVENOUS

## 2016-05-15 MED ORDER — DIAZEPAM 5 MG PO TABS: 2.5000 mg | ORAL_TABLET | Freq: Four times a day (QID) | ORAL | 1 refills | Status: DC | PRN

## 2016-05-15 MED ORDER — LABETALOL HCL 5 MG/ML IV SOLN
INTRAVENOUS | Status: DC | PRN
  Administered 2016-05-15 (×2): 10 mg via INTRAVENOUS

## 2016-05-15 MED ORDER — HYDROMORPHONE HCL 1 MG/ML IJ SOLN: 0.2500 mg | INTRAMUSCULAR | Status: DC | PRN

## 2016-05-15 MED ORDER — ROCURONIUM BROMIDE 50 MG/5ML IV SOSY
PREFILLED_SYRINGE | INTRAVENOUS | Status: AC
  Filled 2016-05-15: qty 5

## 2016-05-15 MED ORDER — PHENYLEPHRINE 40 MCG/ML (10ML) SYRINGE FOR IV PUSH (FOR BLOOD PRESSURE SUPPORT)
PREFILLED_SYRINGE | INTRAVENOUS | Status: AC
  Filled 2016-05-15: qty 10

## 2016-05-15 MED ORDER — PHENYLEPHRINE HCL 10 MG/ML IJ SOLN
INTRAMUSCULAR | Status: DC | PRN
  Administered 2016-05-15: 80 ug via INTRAVENOUS

## 2016-05-15 MED ORDER — FENTANYL CITRATE (PF) 100 MCG/2ML IJ SOLN
INTRAMUSCULAR | Status: AC
  Administered 2016-05-15: 50 ug via INTRAVENOUS
  Filled 2016-05-15: qty 2

## 2016-05-15 MED ORDER — FENTANYL CITRATE (PF) 100 MCG/2ML IJ SOLN
50.0000 ug | INTRAMUSCULAR | Status: DC | PRN
  Administered 2016-05-15: 50 ug via INTRAVENOUS

## 2016-05-15 MED ORDER — MEPERIDINE HCL 25 MG/ML IJ SOLN: 6.2500 mg | INTRAMUSCULAR | Status: DC | PRN

## 2016-05-15 MED ORDER — SODIUM CHLORIDE 0.9 % IR SOLN
Status: DC | PRN
  Administered 2016-05-15 (×5): 6000 mL

## 2016-05-15 MED ORDER — CHLORHEXIDINE GLUCONATE 4 % EX LIQD: 60.0000 mL | Freq: Once | CUTANEOUS | Status: DC

## 2016-05-15 MED ORDER — LACTATED RINGERS IV SOLN: INTRAVENOUS | Status: DC

## 2016-05-15 MED ORDER — ONDANSETRON HCL 4 MG/2ML IJ SOLN
INTRAMUSCULAR | Status: DC | PRN
  Administered 2016-05-15: 4 mg via INTRAVENOUS

## 2016-05-15 MED ORDER — OXYCODONE-ACETAMINOPHEN 5-325 MG PO TABS: 1.0000 | ORAL_TABLET | ORAL | 0 refills | Status: DC | PRN

## 2016-05-15 MED ORDER — CEFAZOLIN SODIUM-DEXTROSE 2-4 GM/100ML-% IV SOLN
2.0000 g | INTRAVENOUS | Status: AC
  Administered 2016-05-15: 2 g via INTRAVENOUS
  Filled 2016-05-15: qty 100

## 2016-05-15 MED ORDER — FENTANYL CITRATE (PF) 100 MCG/2ML IJ SOLN
INTRAMUSCULAR | Status: AC
  Filled 2016-05-15: qty 2

## 2016-05-15 MED ORDER — LABETALOL HCL 5 MG/ML IV SOLN
INTRAVENOUS | Status: AC
  Filled 2016-05-15: qty 4

## 2016-05-15 MED ORDER — ONDANSETRON HCL 4 MG/2ML IJ SOLN
INTRAMUSCULAR | Status: AC
  Filled 2016-05-15: qty 2

## 2016-05-15 MED ORDER — PROMETHAZINE HCL 25 MG/ML IJ SOLN: 6.2500 mg | INTRAMUSCULAR | Status: DC | PRN

## 2016-05-15 MED ORDER — PROPOFOL 10 MG/ML IV BOLUS
INTRAVENOUS | Status: DC | PRN
  Administered 2016-05-15: 150 mg via INTRAVENOUS

## 2016-05-15 MED ORDER — LACTATED RINGERS IV SOLN
INTRAVENOUS | Status: DC
  Administered 2016-05-15 (×2): via INTRAVENOUS

## 2016-05-15 MED ORDER — SUGAMMADEX SODIUM 200 MG/2ML IV SOLN
INTRAVENOUS | Status: AC
  Filled 2016-05-15: qty 2

## 2016-05-15 MED ORDER — MIDAZOLAM HCL 2 MG/2ML IJ SOLN
INTRAMUSCULAR | Status: AC
  Administered 2016-05-15: 2 mg via INTRAVENOUS
  Filled 2016-05-15: qty 2

## 2016-05-15 MED ORDER — SUGAMMADEX SODIUM 200 MG/2ML IV SOLN
INTRAVENOUS | Status: DC | PRN
  Administered 2016-05-15: 200 mg via INTRAVENOUS

## 2016-05-15 MED ORDER — BUPIVACAINE-EPINEPHRINE (PF) 0.5% -1:200000 IJ SOLN
INTRAMUSCULAR | Status: DC | PRN
  Administered 2016-05-15: 15 mL via PERINEURAL

## 2016-05-15 MED ORDER — LIDOCAINE 2% (20 MG/ML) 5 ML SYRINGE
INTRAMUSCULAR | Status: AC
  Filled 2016-05-15: qty 5

## 2016-05-15 MED ORDER — ONDANSETRON HCL 4 MG PO TABS: 4.0000 mg | ORAL_TABLET | Freq: Three times a day (TID) | ORAL | 0 refills | Status: DC | PRN

## 2016-05-15 SURGICAL SUPPLY — 69 items
ANCH SUT PUSHLCK 19.5X3.5 STRL (Anchor) ×1 IMPLANT
ANCH SUT SWLK 19.1X4.75 VT (Anchor) ×4 IMPLANT
ANCH SUT SWLK 19.1X5.5 CLS EL (Anchor) ×1 IMPLANT
ANCHOR PEEK 4.75X19.1 SWLK C (Anchor) ×8 IMPLANT
ANCHOR PEEK SWIVEL LOCK 5.5 (Anchor) ×2 IMPLANT
ANCHOR PUSHLOCK PEEK 3.5X19.5 (Anchor) ×2 IMPLANT
BLADE CUTTER GATOR 3.5 (BLADE) ×3 IMPLANT
BLADE GREAT WHITE 4.2 (BLADE) ×2 IMPLANT
BLADE GREAT WHITE 4.2MM (BLADE) ×1
BLADE SURG 11 STRL SS (BLADE) ×3 IMPLANT
BOOTCOVER CLEANROOM LRG (PROTECTIVE WEAR) ×6 IMPLANT
BUR OVAL 4.0 (BURR) ×3 IMPLANT
CANISTER SUCT LVC 12 LTR MEDI- (MISCELLANEOUS) ×3 IMPLANT
CANNULA ACUFLEX KIT 5X76 (CANNULA) ×3 IMPLANT
CANNULA DRILOCK 5.0MMX75MM (CANNULA) ×1
CANNULA DRILOCK 5.0X75 (CANNULA) ×1 IMPLANT
CANNULA TWIST IN 8.25X7CM (CANNULA) ×4 IMPLANT
CLOSURE WOUND 1/2 X4 (GAUZE/BANDAGES/DRESSINGS) ×1
CONNECTOR 5 IN 1 STRAIGHT STRL (MISCELLANEOUS) ×3 IMPLANT
DRAPE INCISE 23X17 IOBAN STRL (DRAPES) ×2
DRAPE INCISE 23X17 STRL (DRAPES) IMPLANT
DRAPE INCISE IOBAN 23X17 STRL (DRAPES) ×1 IMPLANT
DRAPE INCISE IOBAN 66X45 STRL (DRAPES) ×3 IMPLANT
DRAPE ORTHO SPLIT 77X108 STRL (DRAPES) ×6
DRAPE STERI 35X30 U-POUCH (DRAPES) ×3 IMPLANT
DRAPE SURG 17X11 SM STRL (DRAPES) ×3 IMPLANT
DRAPE SURG ORHT 6 SPLT 77X108 (DRAPES) ×2 IMPLANT
DRAPE U-SHAPE 47X51 STRL (DRAPES) ×3 IMPLANT
DRSG PAD ABDOMINAL 8X10 ST (GAUZE/BANDAGES/DRESSINGS) ×6 IMPLANT
DURAPREP 26ML APPLICATOR (WOUND CARE) ×6 IMPLANT
GAUZE SPONGE 4X4 12PLY STRL (GAUZE/BANDAGES/DRESSINGS) ×3 IMPLANT
GLOVE BIO SURGEON STRL SZ7.5 (GLOVE) ×3 IMPLANT
GLOVE BIO SURGEON STRL SZ8 (GLOVE) ×3 IMPLANT
GLOVE EUDERMIC 7 POWDERFREE (GLOVE) ×3 IMPLANT
GLOVE SS BIOGEL STRL SZ 7.5 (GLOVE) ×1 IMPLANT
GLOVE SUPERSENSE BIOGEL SZ 7.5 (GLOVE) ×2
GOWN STRL REUS W/ TWL LRG LVL3 (GOWN DISPOSABLE) ×1 IMPLANT
GOWN STRL REUS W/ TWL XL LVL3 (GOWN DISPOSABLE) ×2 IMPLANT
GOWN STRL REUS W/TWL LRG LVL3 (GOWN DISPOSABLE) ×3
GOWN STRL REUS W/TWL XL LVL3 (GOWN DISPOSABLE) ×6
KIT BASIN OR (CUSTOM PROCEDURE TRAY) ×3 IMPLANT
KIT ROOM TURNOVER OR (KITS) ×3 IMPLANT
KIT SHOULDER TRACTION (DRAPES) ×3 IMPLANT
MANIFOLD NEPTUNE II (INSTRUMENTS) ×3 IMPLANT
NDL SCORPION MULTI FIRE (NEEDLE) IMPLANT
NDL SPNL 18GX3.5 QUINCKE PK (NEEDLE) ×1 IMPLANT
NEEDLE SCORPION MULTI FIRE (NEEDLE) ×3 IMPLANT
NEEDLE SPNL 18GX3.5 QUINCKE PK (NEEDLE) ×3 IMPLANT
NS IRRIG 1000ML POUR BTL (IV SOLUTION) ×3 IMPLANT
PACK SHOULDER (CUSTOM PROCEDURE TRAY) ×3 IMPLANT
PAD ARMBOARD 7.5X6 YLW CONV (MISCELLANEOUS) ×6 IMPLANT
SET ARTHROSCOPY TUBING (MISCELLANEOUS) ×3
SET ARTHROSCOPY TUBING LN (MISCELLANEOUS) ×1 IMPLANT
SLING ARM IMMOBILIZER LRG (SOFTGOODS) ×2 IMPLANT
SLING ARM LRG ADULT FOAM STRAP (SOFTGOODS) IMPLANT
SLING ARM MED ADULT FOAM STRAP (SOFTGOODS) ×3 IMPLANT
SPONGE LAP 4X18 X RAY DECT (DISPOSABLE) IMPLANT
STRIP CLOSURE SKIN 1/2X4 (GAUZE/BANDAGES/DRESSINGS) ×2 IMPLANT
SUT FIBERWIRE #2 38 T-5 BLUE (SUTURE) ×6
SUT MNCRL AB 3-0 PS2 18 (SUTURE) ×3 IMPLANT
SUT PDS AB 0 CT 36 (SUTURE) IMPLANT
SUT TIGER TAPE 7 IN WHITE (SUTURE) ×2 IMPLANT
SUTURE FIBERWR #2 38 T-5 BLUE (SUTURE) IMPLANT
TAPE FIBER 2MM 7IN #2 BLUE (SUTURE) IMPLANT
TAPE PAPER 3X10 WHT MICROPORE (GAUZE/BANDAGES/DRESSINGS) ×3 IMPLANT
TOWEL OR 17X24 6PK STRL BLUE (TOWEL DISPOSABLE) ×3 IMPLANT
TOWEL OR 17X26 10 PK STRL BLUE (TOWEL DISPOSABLE) ×3 IMPLANT
WAND SUCTION MAX 4MM 90S (SURGICAL WAND) ×5 IMPLANT
WATER STERILE IRR 1000ML POUR (IV SOLUTION) ×3 IMPLANT

## 2016-05-15 NOTE — Anesthesia Postprocedure Evaluation (Signed)
Anesthesia Post Note  Patient: NIKLAS FAIRFIELD  Procedure(s) Performed: Procedure(s) (LRB): LEFT SHOULDER ARTHROSCOPY WITH ROTATOR CUFF REPAIR AND SUBACROMIAL DECOMPRESSION AND DISTAL CLAVICLE RESECTION (Left)  Patient location during evaluation: PACU Anesthesia Type: General Level of consciousness: awake and alert Pain management: pain level controlled Vital Signs Assessment: post-procedure vital signs reviewed and stable Respiratory status: spontaneous breathing, nonlabored ventilation, respiratory function stable and patient connected to nasal cannula oxygen Cardiovascular status: blood pressure returned to baseline and stable Postop Assessment: no signs of nausea or vomiting Anesthetic complications: no       Last Vitals:  Vitals:   05/15/16 1620 05/15/16 1645  BP: (!) 138/101 (!) 147/95  Pulse:  74  Resp: 20 20  Temp:      Last Pain:  Vitals:   05/15/16 1620  TempSrc:   PainSc: 0-No pain                 Catalina Gravel

## 2016-05-15 NOTE — Anesthesia Preprocedure Evaluation (Addendum)
Anesthesia Evaluation  Patient identified by MRN, date of birth, ID band Patient awake    Reviewed: Allergy & Precautions, NPO status , Patient's Chart, lab work & pertinent test results  Airway Mallampati: I  TM Distance: >3 FB Neck ROM: Full    Dental  (+) Teeth Intact, Dental Advisory Given   Pulmonary former smoker,    breath sounds clear to auscultation       Cardiovascular + dysrhythmias Atrial Fibrillation  Rhythm:Regular Rate:Normal     Neuro/Psych PSYCHIATRIC DISORDERS Anxiety Depression negative neurological ROS     GI/Hepatic Neg liver ROS, GERD  ,  Endo/Other  negative endocrine ROS  Renal/GU negative Renal ROS  negative genitourinary   Musculoskeletal  (+) Arthritis , Osteoarthritis,    Abdominal   Peds negative pediatric ROS (+)  Hematology negative hematology ROS (+)   Anesthesia Other Findings - HLD  Reproductive/Obstetrics negative OB ROS                            Lab Results  Component Value Date   WBC 5.9 05/08/2016   HGB 14.9 05/08/2016   HCT 43.5 05/08/2016   MCV 92.0 05/08/2016   PLT 204 05/08/2016   Lab Results  Component Value Date   CREATININE 1.16 05/08/2016   BUN 20 05/08/2016   NA 139 05/08/2016   K 4.0 05/08/2016   CL 106 05/08/2016   CO2 23 05/08/2016   Lab Results  Component Value Date   INR 0.94 05/08/2016   03/2016 EKG: atrial fibrillation   2017 Echo - Left ventricle: The cavity size was normal. Wall thickness was   increased in a pattern of mild LVH. The estimated ejection   fraction was 55%. Wall motion was normal; there were no regional   wall motion abnormalities. The study is not technically   sufficient to allow evaluation of LV diastolic function. - Aortic valve: Trileaflet; moderately calcified leaflets.   Noncoronary cusp mobility was severely restricted. There was mild   stenosis. Aortic valve area approximately 1.5-1.7 cm^2  by   planimetry. - Aortic root: The aortic root was mildly ectatic. - Mitral valve: Calcified annulus. There was trivial regurgitation. - Left atrium: The atrium was mildly dilated. - Right atrium: The atrium was mildly to moderately dilated. - Atrial septum: No defect or patent foramen ovale was identified. - Tricuspid valve: There was trivial regurgitation. - Pulmonary arteries: Systolic pressure could not be accurately   estimated. - Pericardium, extracardiac: There was no pericardial effusion.   Anesthesia Physical Anesthesia Plan  ASA: II  Anesthesia Plan: General   Post-op Pain Management: GA combined w/ Regional for post-op pain   Induction: Intravenous  Airway Management Planned: Oral ETT  Additional Equipment:   Intra-op Plan:   Post-operative Plan: Extubation in OR  Informed Consent: I have reviewed the patients History and Physical, chart, labs and discussed the procedure including the risks, benefits and alternatives for the proposed anesthesia with the patient or authorized representative who has indicated his/her understanding and acceptance.   Dental advisory given  Plan Discussed with: CRNA  Anesthesia Plan Comments:         Anesthesia Quick Evaluation

## 2016-05-15 NOTE — Anesthesia Procedure Notes (Signed)
Anesthesia Regional Block:  Interscalene brachial plexus block  Pre-Anesthetic Checklist: ,, timeout performed, Correct Patient, Correct Site, Correct Laterality, Correct Procedure, Correct Position, site marked, Risks and benefits discussed,  Surgical consent,  Pre-op evaluation,  At surgeon's request and post-op pain management  Laterality: Left  Prep: chloraprep       Needles:  Injection technique: Single-shot  Needle Type: Echogenic Needle     Needle Length: 9cm 9 cm Needle Gauge: 21 and 21 G    Additional Needles:  Procedures: ultrasound guided (picture in chart) Interscalene brachial plexus block Narrative:  Start time: 05/15/2016 11:20 AM End time: 05/15/2016 11:25 AM Injection made incrementally with aspirations every 5 mL.  Performed by: Personally  Anesthesiologist: Suella Broad D  Additional Notes: Tolerated well.

## 2016-05-15 NOTE — Anesthesia Procedure Notes (Signed)
Procedure Name: Intubation Date/Time: 05/15/2016 12:27 PM Performed by: Mariea Clonts Pre-anesthesia Checklist: Patient identified, Emergency Drugs available, Suction available and Patient being monitored Patient Re-evaluated:Patient Re-evaluated prior to inductionOxygen Delivery Method: Circle System Utilized Preoxygenation: Pre-oxygenation with 100% oxygen Intubation Type: IV induction Ventilation: Mask ventilation without difficulty Laryngoscope Size: Miller and 2 Grade View: Grade I Tube type: Oral Tube size: 8.0 mm Number of attempts: 1 Airway Equipment and Method: Stylet and Oral airway Placement Confirmation: ETT inserted through vocal cords under direct vision,  positive ETCO2 and breath sounds checked- equal and bilateral Tube secured with: Tape Dental Injury: Teeth and Oropharynx as per pre-operative assessment

## 2016-05-15 NOTE — H&P (Signed)
Jon Jackson    Chief Complaint: Left shoulder rotator cuff tear HPI: The patient is a 66 y.o. male with ongoing shoulder pain and weakness after a fall with MRI evidence of a massive rotator cuff tear  Past Medical History:  Diagnosis Date  . Anxiety   . Arthritis   . Depression   . Dysrhythmia    AFib  . GERD (gastroesophageal reflux disease)   . Hyperlipidemia   . 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 CARDIOVERSION N/A 04/07/2016   Procedure: CARDIOVERSION;  Surgeon: Satira Sark, MD;  Location: [facility];  Service: Cardiovascular;  Laterality: N/A;  . COLON SURGERY     20 years ago;removal of section due to strangulated bowel.  . COLONOSCOPY N/A 05/30/2013   Procedure: COLONOSCOPY;  Surgeon: [name], MD;  Location: [facility];  Service: Endoscopy;  Laterality: N/A;  8:45 AM  . COLONOSCOPY    . HERNIA REPAIR     25 -30 years ago [facility];right inguinal  . removal of skin cancer     forhead  . TONSILLECTOMY      Family History  Problem Relation Age of Onset  . Colon cancer Brother     Social History:  reports that he quit smoking about 3 months ago. His smoking use included Cigars. He has a 20.00 pack-year smoking history. He has never used smokeless tobacco. He reports that he drinks about 6.0 oz of alcohol per week . He reports that he does not use drugs.   Medications Prior to Admission  Medication Sig Dispense Refill  . apixaban (ELIQUIS) 5 MG TABS tablet Take 1 tablet (5 mg total) by mouth 2 (two) times daily. 60 tablet 6  . citalopram (CELEXA) 20 MG tablet TAKE ONE TABLET BY MOUTH ONCE DAILY 90 tablet 1  . ibuprofen (ADVIL,MOTRIN) 200 MG tablet Take 600-800 mg by mouth every 8 (eight) hours as needed (for pain.).     Marland Kitchen Misc Natural Products (OSTEO BI-FLEX JOINT SHIELD PO) Take 1 tablet by mouth daily.    . Multiple Vitamin (MULTIVITAMIN) tablet Take 1 tablet by mouth daily.    [name] Fill (ALLERGY EYE OP) Place 1-2 drops into both eyes 3 (three) times daily as needed (for seasonal allergy/itchy eyes).    Marland Kitchen omeprazole (PRILOSEC) 40 MG capsule TAKE 1 CAPSULE (40 MG TOTAL) BY MOUTH DAILY. 90 capsule 1  . simvastatin (ZOCOR) 20 MG tablet TAKE 1 TABLET (20 MG TOTAL) BY MOUTH DAILY. 30 tablet 0     Physical Exam: left shoulder with  pain and weakness as noted at recent office visits  Vitals  Temp:  [98.2 F (36.8 C)] 98.2 F (36.8 C) (02/15 1024) Pulse Rate:  [72-107] 87 (02/15 1125) Resp:  [10-20] 16 (02/15 1125) BP: (151-162)/(77-109) 151/109 (02/15 1125) SpO2:  [98 %] 98 % (02/15 1125) Weight:  [102.5 kg (226 lb)] 102.5 kg (226 lb) (02/15 1043)  Assessment/Plan  Impression: Left shoulder rotator cuff tear  Plan of Action: Procedure(s): LEFT SHOULDER ARTHROSCOPY WITH ROTATOR CUFF REPAIR AND SUBACROMIAL DECOMPRESSION AND DISTAL CLAVICLE RESECTION  Dallana Mavity M Jeweliana Dudgeon 05/15/2016, 11:36 AM Contact # [phone]

## 2016-05-15 NOTE — Op Note (Signed)
05/15/2016  3:01 PM  PATIENT:   Jon Jackson  66 y.o. male  PRE-OPERATIVE DIAGNOSIS:  Left shoulder rotator cuff tear  POST-OPERATIVE DIAGNOSIS: Adhesive capsulitis,  Massive left shoulder RCT, labral tear, bicep dislocation, impingement, ac joint OA  PROCEDURE:  LSA, MUA, bicep tenotomy, labral debridement, SAD, DCR, RCR  SURGEON:  Adrin Julian, Metta Clines M.D.  ASSISTANTS: Shuford pac   ANESTHESIA:   GET + ISB  EBL: min  SPECIMEN:  none  Drains: none   PATIENT DISPOSITION:  PACU - hemodynamically stable.    PLAN OF CARE: Discharge to home after PACU  Dictation# [id number]   Contact # [phone]

## 2016-05-15 NOTE — Discharge Instructions (Signed)
Metta Clines. Supple, M.D., F.A.A.O.S. Orthopaedic Surgery Specializing in Arthroscopic and Reconstructive Surgery of the Shoulder and Knee [phone] [address] [place], [place] [address] - Fax [phone]  POST-OP SHOULDER ARTHROSCOPIC ROTATOR CUFF  REPAIR INSTRUCTIONS  1. Call the office at [phone] to schedule your first post-op appointment 7-10 days from the date of your surgery.  2. Leave the steri-strips in place over your incisions when performing dressing changes and showering. You may remove your dressings and begin showering 72 hours from surgery. You can expect drainage that is clear to bloody in nature that occasionally will soak through your dressings. If this occurs go ahead and perform a dressing change. The drainage should lessen daily and when there is no drainage from your incisions feel free to go without a dressing.  3. Wear your sling/immobilizer at all times except to perform the exercises below or to occasionally let your arm dangle by your side to stretch your elbow. You also need to sleep in your sling immobilizer until instructed otherwise.  4. Range of motion to your elbow, wrist, and hand are encouraged 3-5 times daily. Exercise to your hand and fingers helps to reduce swelling you may experience.  5. Utilize ice to the shoulder 3-4 times minimum a day and additionally if you are experiencing pain.  6. You may one-armed drive when safely off of narcotics and muscle relaxants. You may use your hand that is in the sling to support the steering wheel only. However, should it be your right arm that is in the sling it is not to be used for gear shifting in a manual transmission.  7. If you had a block pre-operatively to provide post-op pain relief you may want to go ahead and begin utilizing your pain meds as your arm begins to wake up. Blocks can sometimes last up to 16-18 hours. If you are still pain-free prior to going to bed you may want to  strongly consider taking a pain medication to avoid being awakened in the night with the onset of pain. A muscle relaxant is also provided for you should you experience muscle spasms. It is recommended that if you are experiencing pain that your pain medication alone is not controlling, add the muscle relaxant along with the pain medication which can give additional pain relief. The first one to two days is generally the most severe of your pain and then should gradually decrease. As your pain lessens it is recommended that you decrease your use of the pain medications to an "as needed basis" only and to always comply with the recommended dosages of the pain medications.  8. Pain medications can produce constipation along with their use. If you experience this, the use of an over the counter stool softener or laxative daily is recommended.   9. For additional questions or concerns, please do not hesitate to call the office. If after hours there is an answering service to forward your concerns to the physician on call.  POST-OP EXERCISES  Pendulum Exercises  Perform pendulum exercises while standing and bending at the waist. Support your uninvolved arm on a table or chair and allow your operated arm to hang freely. Make sure to do these exercises passively - not using you shoulder muscle.  Repeat 20 times. Do 3 sessions per day.    YOU HAD A LARGE TEAR SO THESE EXERCISES SHOULD BE PERFORMED VERY VERY GENTLY. DO NOT USE YOUR OWN MUSCLES IN THE SHOULDER TO INITIATE THE  SWINGING.

## 2016-05-15 NOTE — Progress Notes (Signed)
Report given to maryann rn as caregiver 

## 2016-05-15 NOTE — Progress Notes (Signed)
Orthopedic Tech Progress Note Patient Details:  Jon Jackson [birth year] [id number]  Ortho Devices Ortho Device/Splint Location: [facility] Ortho Device/Splint Interventions: Application   [place] Pink 05/15/2016, 3:01 PM

## 2016-05-15 NOTE — Transfer of Care (Signed)
Immediate Anesthesia Transfer of Care Note  Patient: Jon Jackson  Procedure(s) Performed: Procedure(s) with comments: LEFT SHOULDER ARTHROSCOPY WITH ROTATOR CUFF REPAIR AND SUBACROMIAL DECOMPRESSION AND DISTAL CLAVICLE RESECTION (Left) - requests 2hrs  Patient Location: PACU  Anesthesia Type:GA combined with regional for post-op pain  Level of Consciousness: awake, alert  and oriented  Airway & Oxygen Therapy: Patient Spontanous Breathing and Patient connected to nasal cannula oxygen  Post-op Assessment: Report given to RN and Post -op Vital signs reviewed and stable  Post vital signs: Reviewed and stable  Last Vitals:  Vitals:   05/15/16 1125 05/15/16 1515  BP: (!) 151/109   Pulse: 87   Resp: 16   Temp:  36.5 C    Last Pain:  Vitals:   05/15/16 1043  TempSrc:   PainSc: 2       Patients Stated Pain Goal: 2 ([id number])  Complications: No apparent anesthesia complications

## 2016-05-16 ENCOUNTER — Encounter (HOSPITAL_COMMUNITY): Payer: Self-pay | Admitting: Orthopedic Surgery

## 2016-05-16 NOTE — Op Note (Signed)
NAMEGABY, WALENTA              ACCOUNT NO.:  [id number]  MEDICAL RECORD NO.:  [id number]  LOCATION:                                 FACILITY:  PHYSICIAN:  Metta Clines. Silas Sedam, M.D.  DATE OF BIRTH:  December 09, 1950  DATE OF PROCEDURE:  05/15/2016 DATE OF DISCHARGE:                              OPERATIVE REPORT   PREOPERATIVE DIAGNOSES: 1. Massive severely-retracted left shoulder rotator cuff tear. 2. Left shoulder impingement. 3. Left shoulder acromioclavicular joint arthritis.  POSTOPERATIVE DIAGNOSES: 1. Left shoulder adhesive capsulitis. 2. Massive retracted rotator cuff tear. 3. Impingement. 4. Acromioclavicular joint arthropathy. 5. Labral tear. 6. Biceps tendon dislocation.  PROCEDURES: 1. Left shoulder examination under anesthesia. 2. Left shoulder manipulation under anesthesia. 3. Left shoulder glenohumeral joint diagnostic arthroscopy. 4. Biceps tendon tenotomy. 5. Labral debridement. 6. Arthroscopic subacromial decompression, bursectomy with lysis of     adhesions. 7. Arthroscopic distal clavicle resection. 8. Arthroscopic repair of a massive rotator cuff tear involving the     entirety of the supra and infraspinatus as well as the upper     subscapularis.  SURGEON:  Metta Clines. Murlene Revell, M.D.  Terrence DupontOlivia Mackie A. Shuford, P.A.-C.  ANESTHESIA:  General endotracheal as well as an interscalene block.  ESTIMATED BLOOD LOSS:  Minimal.  DRAINS:  None.  HISTORY:  Mr. Hovorka is a 66 year old gentleman who has had persistent left shoulder pain after a fall this past November where he had abrupt increase in pain and profound loss of mobility and global weakness.  He subsequently followed up in our office where we obtained an MRI scan, which showed a massive retracted tear of the rotator cuff.  Due to his acute change in functional capabilities and overall very active lifestyle, we have discussed possible arthroscopic surgery of the rotator cuff repair and potential risks  versus benefits thereof. Possible surgical complications were reviewed including bleeding, infection, neurovascular injury, persistent pain, loss of motion, anesthetic complication, recurrence of rotator cuff tear and the possibly that this may be an irreparable tear, all reviewed.  He understands and accepts and agrees with our planned procedure.  PROCEDURE IN DETAIL:  After undergoing routine preop evaluation, the patient received prophylactic antibiotics and an interscalene block was established in the holding area by the Anesthesia Department.  Placed supine on the operating table, underwent smooth induction of an LMA, general anesthesia.  Turned to the right lateral decubitus position on a beanbag and appropriately padded and protected.  A left shoulder examination under anesthesia revealed elevation of approximately 90 degrees where a firm endpoint was encountered.  At this time, I performed a gentle manipulation with palpable audible release of adhesions achieving 170 degrees of elevation and 90 degrees of rotation. Left arm then suspended at 70 degrees of abduction with 15 pounds of traction.  Left shoulder girdle region was sterilely prepped and draped in standard fashion.  Time-out was called.  Posterior portal established into the glenohumeral joint, anterior portal established under direct visualization.  Large hemarthrosis was evacuated.  There was diffuse grade 1 chondromalacia with some fibrillation of the articular surfaces related to the adhesive capsulitis.  We performed extensive debridement, capsular adhesions were debrided as superior labral tear  was noted, which was debrided, the biceps anchor was stable, but the biceps tendon had dislocated medially.  Due to the large tear of the upper subscapularis and given the subluxation, we went ahead and performed a biceps tenotomy using the Stryker wand.  I then used Stryker wand to carefully release the adhesions on capsule  anteriorly and superiorly and posterior-superiorly such that we could completely visualize and mobilize the rotator cuff.  We did get contractile activity of the supra and infraspinatus musculature along their use in the Stryker wand.  At this point, we cleared evaluation in the glenohumeral and again this did show an obvious large markedly retracted tear of the supra and infraspinatus where the apex of the tendon tear at the glenoid.  Arm was then dropped down to 30 degrees of abduction.  Arthroscope was then reintroduced into the subacromial space.  The posterior portal and direct lateral portal established in the subacromial space.  Multiple adhesions were encountered and these were all divided and excised from the shaver and Stryker wand.  The wand was used to remove the periosteum from the undersurface of the anterior half of the acromion.  A subacromial depression was performed with a bur creating a type 1 morphology.  The portals were established directly anterior to the distal clavicle and distal clavicle resection was performed with a bur. Care was taken to confirm visualization of the entire circumference of the distal clavicle to ensure adequate removal of the bone.  We then completed the subacromial/subdeltoid bursectomy.  At this time, we completely visualized the rotator cuff tear and significant mobilization was required to free up the subscapularis anteriorly and infraspinatus posteriorly, ultimately identified the margins of the rotator cuff tear and confirmed that there was a reasonable elasticity and we could bring the tendon edge over the tuberosity and felt that a rotator cuff repair attempt would be appropriate.  We went ahead and prepared the greater tuberosity, footprint almost 5 cm in width.  I had placed two traction sutures initially, did confirm that we did have good mobility of the tendon, these were then removed, placed two Arthrex PEEK SwiveLock suture  anchors, each loaded with a FiberTape at the osteoarticular margin equidistant across the posterior two-thirds of our tear site and all eight suture limbs were then shuttled through the margin of the rotator cuff tear using the Scorpion suture passer and then, we placed two lateral row anchors, which allowed [place] to bring the posterior perhaps three-quarter to seventh-eights of the entire tendon tear back over the footprint of the tuberosity.  Once this was completed, I placed a horizontal mattress suture through the upper subscapularis and used a single lateral row PushLock to transfix the upper subscapularis and repaired it to the lesser tuberosity region.  At the completion, overall construct was much to our satisfaction with nice reapposition of the tendon margin to the bony bed on tuberosity.  I should mention that we did abrade the tuberosity to bleeding bed in preparation for the repair. At this point, all suture limbs were then clipped.  Hemostasis was obtained.  Fluid and instruments were removed.  The portals were closed with Monocryl and a Steri-Strip.  Dry dressing taped at the left shoulder.  Left arm was placed in a sling with abduction pillow and the patient was then awakened, extubated and taken to the recovery room in stable condition.  Tracy A. Shuford, PA-C was used as an Environmental consultant throughout this case, was essential for help with positioning the  patient, positioning the extremity, management of the arthroscopic equipment, tissue manipulation, suture management, wound closure, and intraoperative decision making.     Metta Clines. Azai Gaffin, M.D.   ______________________________ Metta Clines. Synai Prettyman, M.D.    KMS/MEDQ  D:  05/15/2016  T:  05/15/2016  Job:  [id number]

## 2016-05-20 ENCOUNTER — Encounter (HOSPITAL_COMMUNITY): Payer: Self-pay | Admitting: Orthopedic Surgery

## 2016-05-23 DIAGNOSIS — S46012D Strain of muscle(s) and tendon(s) of the rotator cuff of left shoulder, subsequent encounter: Secondary | ICD-10-CM | POA: Diagnosis not present

## 2016-05-23 DIAGNOSIS — Z4789 Encounter for other orthopedic aftercare: Secondary | ICD-10-CM | POA: Diagnosis not present

## 2016-06-11 ENCOUNTER — Encounter: Payer: Self-pay | Admitting: Cardiology

## 2016-06-11 NOTE — Progress Notes (Signed)
Cardiology Office Note  Date: 06/12/2016   ID: Jon Jackson, DOB [birth year], MRN [id number]  PCP: [name], MD  Primary Cardiologist: [name], MD   Chief Complaint  Patient presents with  . Atrial Fibrillation    History of Present Illness: Jon Jackson is a 66 y.o. male last seen in January by Ms. [name] Public PA-C. At that time he was seen in follow-up of cardioversion for persistent atrial fibrillation. Procedure was initially successful however he reverted to atrial fibrillation within a week. Patient also experiencing significant problems at that time with his left shoulder, ultimately underwent extensive shoulder surgery with Dr. [name] on February 15, was off Eliquis temporarily for the procedure.   He is here today with his wife for a follow-up visit. He states that during the week that he was back in sinus rhythm he felt much better, more energy. He would like to try cardioversion again. He has been back on Eliquis. States that he has had a slight amount of bright red blood on his tissue when he wipes. Reports having hemorrhoids and a colonoscopy about a year ago with Dr. [name] - I asked him to check back in with him, but we would plan to continue Eliquis for now.  Today we discussed the possibility of antiarrhythmic treatment, he also mentioned that he had heard about atrial fibrillation ablation. Our plan is to have him evaluated by EP to coordinate further treatment.  Past Medical History:  Diagnosis Date  . Anxiety   . Arthritis   . Atrial fibrillation ([place])   . Depression   . GERD (gastroesophageal reflux disease)   . Hyperlipidemia   . 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 CARDIOVERSION N/A 04/07/2016   Procedure: CARDIOVERSION;  Surgeon: Satira Sark, MD;  Location: [facility];  Service: Cardiovascular;  Laterality: N/A;  . COLON SURGERY     20 years ago;removal of section due to strangulated  bowel.  . COLONOSCOPY N/A 05/30/2013   Procedure: COLONOSCOPY;  Surgeon: [name], MD;  [facility];  Service: Endoscopy;  Laterality: N/A;  8:45 AM  . COLONOSCOPY    . HERNIA REPAIR     25 -30 years ago [facility];right inguinal  . removal of skin cancer     forhead  . SHOULDER ARTHROSCOPY WITH ROTATOR CUFF REPAIR AND SUBACROMIAL DECOMPRESSION Left 05/15/2016   Procedure: LEFT SHOULDER ARTHROSCOPY WITH ROTATOR CUFF REPAIR AND SUBACROMIAL DECOMPRESSION AND DISTAL CLAVICLE RESECTION;  Surgeon: [name], MD;  Location: [facility];  Service: Orthopedics;  Laterality: Left;  requests 2hrs  . TONSILLECTOMY      Current Outpatient Prescriptions  Medication Sig Dispense Refill  . apixaban (ELIQUIS) 5 MG TABS tablet Take 1 tablet (5 mg total) by mouth 2 (two) times daily. 60 tablet 6  . citalopram (CELEXA) 20 MG tablet TAKE ONE TABLET BY MOUTH ONCE DAILY 90 tablet 1  . diazepam (VALIUM) 5 MG tablet Take 0.5-1 tablets (2.5-5 mg total) by mouth every 6 (six) hours as needed for muscle spasms or sedation. 40 tablet 1  . ibuprofen (ADVIL,MOTRIN) 200 MG tablet Take 600-800 mg by mouth every 8 (eight) hours as needed (for pain.).     Marland Kitchen Misc Natural Products (OSTEO BI-FLEX JOINT SHIELD PO) Take 1 tablet by mouth daily.    . Multiple Vitamin (MULTIVITAMIN) tablet Take 1 tablet by mouth daily.    [name] Fill (ALLERGY  EYE OP) Place 1-2 drops into both eyes 3 (three) times daily as needed (for seasonal allergy/itchy eyes).    Marland Kitchen omeprazole (PRILOSEC) 40 MG capsule TAKE 1 CAPSULE (40 MG TOTAL) BY MOUTH DAILY. 90 capsule 1  . ondansetron (ZOFRAN) 4 MG tablet Take 1 tablet (4 mg total) by mouth every 8 (eight) hours as needed for nausea or vomiting. 20 tablet 0  . oxyCODONE-acetaminophen (PERCOCET) 5-325 MG tablet Take 1-2 tablets by mouth every 4 (four) hours as needed. 40 tablet 0  . simvastatin (ZOCOR) 20 MG tablet TAKE 1 TABLET (20 MG TOTAL) BY MOUTH DAILY. 30 tablet 0   No current  facility-administered medications for this visit.    Allergies:  Patient has no known allergies.   Social History: The patient  reports that he quit smoking about 4 months ago. His smoking use included Cigars. He has a 20.00 pack-year smoking history. He has never used smokeless tobacco. He reports that he drinks about 6.0 oz of alcohol per week . He reports that he does not use drugs.   ROS:  Please see the history of present illness. Otherwise, complete review of systems is positive for shoulder pain improved, pending rehabilitation.  All other systems are reviewed and negative.   Physical Exam: VS:  BP 124/62   Pulse 66   Ht 5\' 10"  (1.778 m)   Wt 216 lb (98 kg)   SpO2 97%   BMI 30.99 kg/m , BMI Body mass index is 30.99 kg/m.  Wt Readings from Last 3 Encounters:  06/12/16 216 lb (98 kg)  05/15/16 226 lb (102.5 kg)  05/08/16 226 lb 3.2 oz (102.6 kg)    General: Patient appears comfortable at rest. HEENT: Conjunctiva and lids normal, oropharynx clear. Neck: Supple, no elevated JVP or carotid bruits, no thyromegaly. Lungs: Clear to auscultation, nonlabored breathing at rest. Cardiac: Irregularly irregular, no S3 or significant systolic murmur, no pericardial rub. Abdomen: Soft, nontender, bowel sounds present, no guarding or rebound. Extremities: No pitting edema, distal pulses 2+. Skin: Warm and dry. Musculoskeletal: No kyphosis. Neuropsychiatric: Alert and oriented x3, affect grossly appropriate.  ECG: I personally reviewed the tracing from 04/18/2016 which showed rate-controlled atrial fibrillation with IVCD and nonspecific T-wave changes.  Recent Labwork: 02/13/2016: ALT 19; AST 18; TSH 1.540 05/08/2016: BUN 20; Creatinine, Ser 1.16; Hemoglobin 14.9; Platelets 204; Potassium 4.0; Sodium 139     Component Value Date/Time   CHOL 151 02/13/2016 0918   TRIG 103 02/13/2016 0918   HDL 41 02/13/2016 0918   CHOLHDL 3.7 02/13/2016 0918   CHOLHDL 3.1 11/18/2014 0846   VLDL 14  11/18/2014 0846   LDLCALC 89 02/13/2016 0918    Other Studies Reviewed Today:  Echocardiogram 03/04/2016: Study Conclusions  - Left ventricle: The cavity size was normal. Wall thickness was   increased in a pattern of mild LVH. The estimated ejection   fraction was 55%. Wall motion was normal; there were no regional   wall motion abnormalities. The study is not technically   sufficient to allow evaluation of LV diastolic function. - Aortic valve: Trileaflet; moderately calcified leaflets.   Noncoronary cusp mobility was severely restricted. There was mild   stenosis. Aortic valve area approximately 1.5-1.7 cm^2 by   planimetry. - Aortic root: The aortic root was mildly ectatic. - Mitral valve: Calcified annulus. There was trivial regurgitation. - Left atrium: The atrium was mildly dilated. - Right atrium: The atrium was mildly to moderately dilated. - Atrial septum: No defect or patent foramen  ovale was identified. - Tricuspid valve: There was trivial regurgitation. - Pulmonary arteries: Systolic pressure could not be accurately   estimated. - Pericardium, extracardiac: There was no pericardial effusion.  Impressions:  - Mild LVH with LVEF approximately 55%. Indeterminate diastolic   function in the setting of atrial fibrillation. Mild left atrial   enlargement. Mildly calcified mitral annulus with trivial mitral   regurgitation. Mild calcific aortic stenosis as outlined above.   Mildly ectatic aortic root. Trivial tricuspid regurgitation.  Assessment and Plan:  1. Recurrent, persistent atrial fibrillation, symptomatic. CHADSVASC score is 2. He underwent cardioversion in January which was successful for about a week, during which time he felt much better. He would like to try for cardioversion again, but I do not think that this would be of much use without considering antiarrhythmic therapy, he also mentioned that he had heard about the possibility of an atrial fibrillation  ablation. For now we will continue Eliquis and I will have him referred to EP to discuss options and coordinate further treatment of his arrhythmia.  2. Essential hypertension by history, currently not on anti-hypertensives with normal blood pressure today.  3. Reports mild hematochezia intermittently with prior history of hemorrhoids and a colonoscopy about a year ago with Dr. [name]. I have asked him to follow-up with Dr. [name] again to see if any thing else needs to be pursued.  4. Mixed hyperlipidemia, on Zocor.  Current medicines were reviewed with the patient today.   Orders Placed This Encounter  Procedures  . Ambulatory referral to Cardiac Electrophysiology    Disposition: Follow-up after EP consultation.  Signed, Satira Sark, MD, [facility] 06/12/2016 8:12 AM    [facility]  [address], [place], [place] [address] Phone: [phone]; Fax: [phone]

## 2016-06-12 ENCOUNTER — Ambulatory Visit (INDEPENDENT_AMBULATORY_CARE_PROVIDER_SITE_OTHER): Payer: Medicare Other | Admitting: Cardiology

## 2016-06-12 ENCOUNTER — Encounter: Payer: Self-pay | Admitting: Cardiology

## 2016-06-12 VITALS — BP 124/62 | HR 66 | Ht 70.0 in | Wt 216.0 lb

## 2016-06-12 DIAGNOSIS — E782 Mixed hyperlipidemia: Secondary | ICD-10-CM | POA: Diagnosis not present

## 2016-06-12 DIAGNOSIS — I481 Persistent atrial fibrillation: Secondary | ICD-10-CM | POA: Diagnosis not present

## 2016-06-12 DIAGNOSIS — I10 Essential (primary) hypertension: Secondary | ICD-10-CM | POA: Diagnosis not present

## 2016-06-12 DIAGNOSIS — K921 Melena: Secondary | ICD-10-CM | POA: Diagnosis not present

## 2016-06-12 DIAGNOSIS — I4819 Other persistent atrial fibrillation: Secondary | ICD-10-CM

## 2016-06-12 NOTE — Patient Instructions (Signed)
Your physician recommends that you schedule a follow-up appointment in:  3 months with Dr Ferne Reus have been referred to electrophysiology, the [facility] office will call you, [phone]     Your physician recommends that you continue on your current medications as directed. Please refer to the Current Medication list given to you today.      Thank you for choosing [facility] !

## 2016-06-23 ENCOUNTER — Other Ambulatory Visit: Payer: Self-pay | Admitting: Family Medicine

## 2016-06-23 DIAGNOSIS — Z4789 Encounter for other orthopedic aftercare: Secondary | ICD-10-CM | POA: Diagnosis not present

## 2016-06-27 DIAGNOSIS — M62512 Muscle wasting and atrophy, not elsewhere classified, left shoulder: Secondary | ICD-10-CM | POA: Diagnosis not present

## 2016-06-27 DIAGNOSIS — Z4789 Encounter for other orthopedic aftercare: Secondary | ICD-10-CM | POA: Diagnosis not present

## 2016-06-27 DIAGNOSIS — M25512 Pain in left shoulder: Secondary | ICD-10-CM | POA: Diagnosis not present

## 2016-06-27 DIAGNOSIS — M25612 Stiffness of left shoulder, not elsewhere classified: Secondary | ICD-10-CM | POA: Diagnosis not present

## 2016-06-30 ENCOUNTER — Encounter: Payer: Self-pay | Admitting: *Deleted

## 2016-06-30 ENCOUNTER — Ambulatory Visit (INDEPENDENT_AMBULATORY_CARE_PROVIDER_SITE_OTHER): Payer: Medicare Other | Admitting: Cardiology

## 2016-06-30 ENCOUNTER — Other Ambulatory Visit: Payer: Self-pay | Admitting: Cardiology

## 2016-06-30 ENCOUNTER — Encounter: Payer: Self-pay | Admitting: Cardiology

## 2016-06-30 VITALS — BP 144/90 | HR 98 | Ht 70.0 in | Wt 226.8 lb

## 2016-06-30 DIAGNOSIS — I481 Persistent atrial fibrillation: Secondary | ICD-10-CM | POA: Diagnosis not present

## 2016-06-30 DIAGNOSIS — I4819 Other persistent atrial fibrillation: Secondary | ICD-10-CM

## 2016-06-30 DIAGNOSIS — Z01812 Encounter for preprocedural laboratory examination: Secondary | ICD-10-CM

## 2016-06-30 NOTE — Progress Notes (Signed)
Electrophysiology Office Note   Date:  06/30/2016   ID:  Jon Jackson, DOB Jan 23, 1951, MRN [id number]  PCP:  Jon Hillier, MD  Cardiologist:  [name] Primary Electrophysiologist:  Jon Kaleta Meredith Leeds, MD    Chief Complaint  Patient presents with  . Atrial Fibrillation     History of Present Illness: Jon Jackson is a 66 y.o. male who presents today for electrophysiology evaluation.   [name] is a 66 y.o. male who is being seen today for the evaluation of atrial fibrillation at the request of Jon Jackson, Jon Bushy, MD. He has a history of persistent atrial fibrillation. Had a cardioversion 04/07/16 and reverted back to atrial fibrillation after one week.    Today, he denies symptoms of chest pain, orthopnea, PND, lower extremity edema, claudication, dizziness, presyncope, syncope, bleeding, or neurologic sequela. The patient is tolerating medications without difficulties. His main symptoms concerning his atrial fibrillation are due to fatigue, and shortness of breath. 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 He also gets episodes of palpitations.   Past Medical History:  Diagnosis Date  . Anxiety   . Arthritis   . Atrial fibrillation ([place])   . Depression   . GERD (gastroesophageal reflux disease)   . Hyperlipidemia   . 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 CARDIOVERSION N/A 04/07/2016   Procedure: CARDIOVERSION;  Surgeon: Satira Sark, MD;  Location: [facility];  Service: Cardiovascular;  Laterality: N/A;  . COLON SURGERY     20 years ago;removal of section due to strangulated bowel.  . COLONOSCOPY N/A 05/30/2013   Procedure: COLONOSCOPY;  Surgeon: Jon Dolin, MD;  [facility];  Service: Endoscopy;  Laterality: N/A;  8:45 AM  . COLONOSCOPY    . HERNIA REPAIR     25 -30 years ago [facility];right inguinal  . removal of skin cancer      forhead  . SHOULDER ARTHROSCOPY WITH ROTATOR CUFF REPAIR AND SUBACROMIAL DECOMPRESSION Left 05/15/2016   Procedure: LEFT SHOULDER ARTHROSCOPY WITH ROTATOR CUFF REPAIR AND SUBACROMIAL DECOMPRESSION AND DISTAL CLAVICLE RESECTION;  Surgeon: Jon Britain, MD;  Location: [facility];  Service: Orthopedics;  Laterality: Left;  requests 2hrs  . TONSILLECTOMY       Current Outpatient Prescriptions  Medication Sig Dispense Refill  . apixaban (ELIQUIS) 5 MG TABS tablet Take 1 tablet (5 mg total) by mouth 2 (two) times daily. 60 tablet 6  . citalopram (CELEXA) 20 MG tablet TAKE ONE TABLET BY MOUTH ONCE DAILY 90 tablet 1  . Misc Natural Products (OSTEO BI-FLEX JOINT SHIELD PO) Take 1 tablet by mouth daily.    . Multiple Vitamin (MULTIVITAMIN) tablet Take 1 tablet by mouth daily.    [name] Fill (ALLERGY EYE OP) Place 1-2 drops into both eyes 3 (three) times daily as needed (for seasonal allergy/itchy eyes).    Marland Kitchen omeprazole (PRILOSEC) 40 MG capsule TAKE 1 CAPSULE (40 MG TOTAL) BY MOUTH DAILY. 90 capsule 1  . simvastatin (ZOCOR) 20 MG tablet TAKE ONE TABLET BY MOUTH ONCE DAILY 30 tablet 0   No current facility-administered medications for this visit.     Allergies:   Patient has no known allergies.   Social History:  The patient  reports that he quit smoking about 4 months ago. His smoking use included Cigars. He has a 20.00 pack-year smoking history.  He has never used smokeless tobacco. He reports that he drinks about 6.0 oz of alcohol per week . He reports that he does not use drugs.   Family History:  The patient's family history includes Colon cancer in his brother.    ROS:  Please see the history of present illness.   Otherwise, review of systems is positive for Chest pressure, palpitations.   All other systems are reviewed and negative.    PHYSICAL EXAM: VS:  BP (!) 144/90 (BP Location: Left Arm, Patient Position: Sitting, Cuff Size: Normal)   Pulse 98   Ht 5\' 10"  (1.778 m)   Wt  226 lb 12.8 oz (102.9 kg)   SpO2 97%   BMI 32.54 kg/m  , BMI Body mass index is 32.54 kg/m. GEN: Well nourished, well developed, in no acute distress  HEENT: normal  Neck: no JVD, carotid bruits, or masses Cardiac: iRRR; no murmurs, rubs, or gallops,no edema  Respiratory:  clear to auscultation bilaterally, normal work of breathing GI: soft, nontender, nondistended, + BS MS: no deformity or atrophy  Skin: warm and dry Neuro:  Strength and sensation are intact Psych: euthymic mood, full affect  EKG:  EKG is ordered today. Personal review of the ekg ordered shows atrial fibrillation, rate 98  Recent Labs: 02/13/2016: ALT 19; TSH 1.540 05/08/2016: BUN 20; Creatinine, Ser 1.16; Hemoglobin 14.9; Platelets 204; Potassium 4.0; Sodium 139    Lipid Panel     Component Value Date/Time   CHOL 151 02/13/2016 0918   TRIG 103 02/13/2016 0918   HDL 41 02/13/2016 0918   CHOLHDL 3.7 02/13/2016 0918   CHOLHDL 3.1 11/18/2014 0846   VLDL 14 11/18/2014 0846   LDLCALC 89 02/13/2016 0918     Wt Readings from Last 3 Encounters:  06/30/16 226 lb 12.8 oz (102.9 kg)  06/12/16 216 lb (98 kg)  05/15/16 226 lb (102.5 kg)      Other studies Reviewed: Additional studies/ records that were reviewed today include: TTE 03/04/16  Review of the above records today demonstrates:  - Left ventricle: The cavity size was normal. Wall thickness was   increased in a pattern of mild LVH. The estimated ejection   fraction was 55%. Wall motion was normal; there were no regional   wall motion abnormalities. The study is not technically   sufficient to allow evaluation of LV diastolic function. - Aortic valve: Trileaflet; moderately calcified leaflets.   Noncoronary cusp mobility was severely restricted. There was mild   stenosis. Aortic valve area approximately 1.5-1.7 cm^2 by   planimetry. - Aortic root: The aortic root was mildly ectatic. - Mitral valve: Calcified annulus. There was trivial regurgitation. -  Left atrium: The atrium was mildly dilated. - Right atrium: The atrium was mildly to moderately dilated. - Atrial septum: No defect or patent foramen ovale was identified. - Tricuspid valve: There was trivial regurgitation. - Pulmonary arteries: Systolic pressure could not be accurately   estimated. - Pericardium, extracardiac: There was no pericardial effusion.   ASSESSMENT AND PLAN:  1.  Persistent atrial fibrillation: Currently on Eliquis. Cardioversion January which was successful for one week. I discussed with him options of atrial fibrillation therapy including ablation and medical management. Risks and benefits of ablation were discussed. Risks include, but are not limited to bleeding, tamponade, heart block, stroke, and damage to surrounding organs. He understands these risks and has agreed to the procedure.  This patients CHA2DS2-VASc Score and unadjusted Ischemic Stroke Rate (% per year) is equal to  2.2 % stroke rate/year from a score of 2  Above score calculated as 1 point each if present [CHF, HTN, DM, Vascular=MI/PAD/Aortic Plaque, Age if 65-74, or Male] Above score calculated as 2 points each if present [Age > 75, or Stroke/TIA/TE]  2. Essential hypertension: Mildly elevated today. Verdene Creson continue to monitor.  3. Hyperlipidemia: Continue simvastatin     Current medicines are reviewed at length with the patient today.   The patient does not have concerns regarding his medicines.  The following changes were made today:  none  Labs/ tests ordered today include:  No orders of the defined types were placed in this encounter.    Disposition:   FU with Manha Amato 3 months  Signed, Eilis Chestnutt Meredith Leeds, MD  06/30/2016 10:36 AM     [facility] [address] [place] [address] [phone] (office) [phone] (fax)

## 2016-06-30 NOTE — Patient Instructions (Signed)
Medication Instructions:    Your physician recommends that you continue on your current medications as directed. Please refer to the Current Medication list given to you today.  --- If you need a refill on your cardiac medications before your next appointment, please call your pharmacy. ---  Labwork:  Your physician recommends that you return for pre procedure lab work between: 4/10-4/20   Testing/Procedures: Your physician has recommended that you have an Atrial Fibrillation ablation. Catheter ablation is a medical procedure used to treat some cardiac arrhythmias (irregular heartbeats). During catheter ablation, a long, thin, flexible tube is put into a blood vessel in your groin (upper thigh), or neck. This tube is called an ablation catheter. It is then guided to your heart through the blood vessel. Radio frequency waves destroy small areas of heart tissue where abnormal heartbeats may cause an arrhythmia to start. Please see the instruction sheet given to you today.  Your physician has requested that you have cardiac CT. Cardiac computed tomography (CT) is a painless test that uses an x-ray machine to take clear, detailed pictures of your heart. For further information please visit [email or web address]. Please follow instruction sheet as given.  Follow-Up:  Your physician recommends that you schedule a follow-up appointment in: 4 weeks, after your procedure on 07/22/16, with Roderic Palau in the AFib clinic.   Your physician recommends that you schedule a follow-up appointment in: 3 months, after your procedure on 07/22/16, with Dr. Curt Bears.  Thank you for choosing [facility]!!   Trinidad Curet, RN [phone]   Any Other Special Instructions Will Be Listed Below (If Applicable).   Cardiac Ablation Cardiac ablation is a procedure to disable (ablate) a small amount of heart tissue in very specific places. The heart has many electrical connections. Sometimes these connections are  abnormal and can cause the heart to beat very fast or irregularly. Ablating some of the problem areas can improve the heart rhythm or return it to normal. Ablation may be done for people who:  Have Wolff-Parkinson-White syndrome.  Have fast heart rhythms (tachycardia).  Have taken medicines for an abnormal heart rhythm (arrhythmia) that were not effective or caused side effects.  Have a high-risk heartbeat that may be life-threatening. During the procedure, a small incision is made in the neck or the groin, and a long, thin, flexible tube (catheter) is inserted into the incision and moved to the heart. Small devices (electrodes) on the tip of the catheter will send out electrical currents. A type of X-ray (fluoroscopy) will be used to help guide the catheter and to provide images of the heart. Tell a health care provider about:  Any allergies you have.  All medicines you are taking, including vitamins, herbs, eye drops, creams, and over-the-counter medicines.  Any problems you or family members have had with anesthetic medicines.  Any blood disorders you have.  Any surgeries you have had.  Any medical conditions you have, such as kidney failure.  Whether you are pregnant or may be pregnant. What are the risks? Generally, this is a safe procedure. However, problems may occur, including:  Infection.  Bruising and bleeding at the catheter insertion site.  Bleeding into the chest, especially into the sac that surrounds the heart. This is a serious complication.  Stroke or blood clots.  Damage to other structures or organs.  Allergic reaction to medicines or dyes.  Need for a permanent pacemaker if the normal electrical system is damaged. A pacemaker is a small computer that sends electrical  signals to the heart and helps your heart beat normally.  The procedure not being fully effective. This may not be recognized until months later. Repeat ablation procedures are sometimes  required. What happens before the procedure?  Follow instructions from your health care provider about eating or drinking restrictions.  Ask your health care provider about:  Changing or stopping your regular medicines. This is especially important if you are taking diabetes medicines or blood thinners.  Taking medicines such as aspirin and ibuprofen. These medicines can thin your blood. Do not take these medicines before your procedure if your health care provider instructs you not to.  Plan to have someone take you home from the hospital or clinic.  If you will be going home right after the procedure, plan to have someone with you for 24 hours. What happens during the procedure?  To lower your risk of infection:  Your health care team will wash or sanitize their hands.  Your skin will be washed with soap.  Hair may be removed from the incision area.  An IV tube will be inserted into one of your veins.  You will be given a medicine to help you relax (sedative).  The skin on your neck or groin will be numbed.  An incision will be made in your neck or your groin.  A needle will be inserted through the incision and into a large vein in your neck or groin.  A catheter will be inserted into the needle and moved to your heart.  Dye may be injected through the catheter to help your surgeon see the area of the heart that needs treatment.  Electrical currents will be sent from the catheter to ablate heart tissue in desired areas. There are three types of energy that may be used to ablate heart tissue:  Heat (radiofrequency energy).  Laser energy.  Extreme cold (cryoablation).  When the necessary tissue has been ablated, the catheter will be removed.  Pressure will be held on the catheter insertion area to prevent excessive bleeding.  A bandage (dressing) will be placed over the catheter insertion area. The procedure may vary among health care providers and hospitals. What  happens after the procedure?  Your blood pressure, heart rate, breathing rate, and blood oxygen level will be monitored until the medicines you were given have worn off.  Your catheter insertion area will be monitored for bleeding. You will need to lie still for a few hours to ensure that you do not bleed from the catheter insertion area.  Do not drive for 24 hours or as long as directed by your health care provider. Summary  Cardiac ablation is a procedure to disable (ablate) a small amount of heart tissue in very specific places. Ablating some of the problem areas can improve the heart rhythm or return it to normal.  During the procedure, electrical currents will be sent from the catheter to ablate heart tissue in desired areas. This information is not intended to replace advice given to you by your health care provider. Make sure you discuss any questions you have with your health care provider. Document Released: 08/03/2008 Document Revised: 02/04/2016 Document Reviewed: 02/04/2016 Elsevier Interactive Patient Education  2017 [employer].

## 2016-07-01 DIAGNOSIS — M62512 Muscle wasting and atrophy, not elsewhere classified, left shoulder: Secondary | ICD-10-CM | POA: Diagnosis not present

## 2016-07-01 DIAGNOSIS — M25612 Stiffness of left shoulder, not elsewhere classified: Secondary | ICD-10-CM | POA: Diagnosis not present

## 2016-07-01 DIAGNOSIS — M25512 Pain in left shoulder: Secondary | ICD-10-CM | POA: Diagnosis not present

## 2016-07-01 DIAGNOSIS — Z4789 Encounter for other orthopedic aftercare: Secondary | ICD-10-CM | POA: Diagnosis not present

## 2016-07-02 DIAGNOSIS — M25612 Stiffness of left shoulder, not elsewhere classified: Secondary | ICD-10-CM | POA: Diagnosis not present

## 2016-07-02 DIAGNOSIS — M25512 Pain in left shoulder: Secondary | ICD-10-CM | POA: Diagnosis not present

## 2016-07-02 DIAGNOSIS — Z4789 Encounter for other orthopedic aftercare: Secondary | ICD-10-CM | POA: Diagnosis not present

## 2016-07-02 DIAGNOSIS — M62512 Muscle wasting and atrophy, not elsewhere classified, left shoulder: Secondary | ICD-10-CM | POA: Diagnosis not present

## 2016-07-04 DIAGNOSIS — M62512 Muscle wasting and atrophy, not elsewhere classified, left shoulder: Secondary | ICD-10-CM | POA: Diagnosis not present

## 2016-07-04 DIAGNOSIS — M25512 Pain in left shoulder: Secondary | ICD-10-CM | POA: Diagnosis not present

## 2016-07-04 DIAGNOSIS — M25612 Stiffness of left shoulder, not elsewhere classified: Secondary | ICD-10-CM | POA: Diagnosis not present

## 2016-07-04 DIAGNOSIS — Z4789 Encounter for other orthopedic aftercare: Secondary | ICD-10-CM | POA: Diagnosis not present

## 2016-07-08 DIAGNOSIS — M25512 Pain in left shoulder: Secondary | ICD-10-CM | POA: Diagnosis not present

## 2016-07-08 DIAGNOSIS — M25612 Stiffness of left shoulder, not elsewhere classified: Secondary | ICD-10-CM | POA: Diagnosis not present

## 2016-07-08 DIAGNOSIS — M62512 Muscle wasting and atrophy, not elsewhere classified, left shoulder: Secondary | ICD-10-CM | POA: Diagnosis not present

## 2016-07-08 DIAGNOSIS — Z4789 Encounter for other orthopedic aftercare: Secondary | ICD-10-CM | POA: Diagnosis not present

## 2016-07-10 DIAGNOSIS — M25512 Pain in left shoulder: Secondary | ICD-10-CM | POA: Diagnosis not present

## 2016-07-10 DIAGNOSIS — Z4789 Encounter for other orthopedic aftercare: Secondary | ICD-10-CM | POA: Diagnosis not present

## 2016-07-10 DIAGNOSIS — M25612 Stiffness of left shoulder, not elsewhere classified: Secondary | ICD-10-CM | POA: Diagnosis not present

## 2016-07-10 DIAGNOSIS — M62512 Muscle wasting and atrophy, not elsewhere classified, left shoulder: Secondary | ICD-10-CM | POA: Diagnosis not present

## 2016-07-11 DIAGNOSIS — Z4789 Encounter for other orthopedic aftercare: Secondary | ICD-10-CM | POA: Diagnosis not present

## 2016-07-11 DIAGNOSIS — M25612 Stiffness of left shoulder, not elsewhere classified: Secondary | ICD-10-CM | POA: Diagnosis not present

## 2016-07-11 DIAGNOSIS — M62512 Muscle wasting and atrophy, not elsewhere classified, left shoulder: Secondary | ICD-10-CM | POA: Diagnosis not present

## 2016-07-11 DIAGNOSIS — M25512 Pain in left shoulder: Secondary | ICD-10-CM | POA: Diagnosis not present

## 2016-07-15 ENCOUNTER — Encounter: Payer: Self-pay | Admitting: Cardiology

## 2016-07-15 ENCOUNTER — Other Ambulatory Visit: Payer: Medicare Other

## 2016-07-15 ENCOUNTER — Telehealth: Payer: Self-pay | Admitting: Cardiology

## 2016-07-15 DIAGNOSIS — Z4789 Encounter for other orthopedic aftercare: Secondary | ICD-10-CM | POA: Diagnosis not present

## 2016-07-15 DIAGNOSIS — M25512 Pain in left shoulder: Secondary | ICD-10-CM | POA: Diagnosis not present

## 2016-07-15 DIAGNOSIS — M25612 Stiffness of left shoulder, not elsewhere classified: Secondary | ICD-10-CM | POA: Diagnosis not present

## 2016-07-15 DIAGNOSIS — M62512 Muscle wasting and atrophy, not elsewhere classified, left shoulder: Secondary | ICD-10-CM | POA: Diagnosis not present

## 2016-07-15 NOTE — Telephone Encounter (Signed)
New Message  Pt voiced this has not been setup and would like to speak to someone about it.  Please f/u

## 2016-07-15 NOTE — Telephone Encounter (Signed)
Pt calling to follow-up on coronary ct that was ordered and was to be scheduled prior to his afib ablation on 4/24.  Informed the pt that I went and spoke to our CT scheduler Mack Guise, and per Ivin Booty, she will be calling him within the next 20- 40 mins to have this test scheduled.  Informed the pt that Ivin Booty just got confirmation back from his insurance carrier and our pre-cert dept, that this is ready to be scheduled.  Pt verbalized understanding and agrees with this plan.  Pt gracious for all the assistance provided.

## 2016-07-16 ENCOUNTER — Other Ambulatory Visit: Payer: Medicare Other

## 2016-07-16 DIAGNOSIS — Z01812 Encounter for preprocedural laboratory examination: Secondary | ICD-10-CM

## 2016-07-16 DIAGNOSIS — I481 Persistent atrial fibrillation: Secondary | ICD-10-CM | POA: Diagnosis not present

## 2016-07-16 DIAGNOSIS — I4819 Other persistent atrial fibrillation: Secondary | ICD-10-CM

## 2016-07-17 ENCOUNTER — Ambulatory Visit (HOSPITAL_COMMUNITY)
Admission: RE | Admit: 2016-07-17 | Discharge: 2016-07-17 | Disposition: A | Discharge status: Home / Self Care | Payer: Medicare Other | Source: Ambulatory Visit | Attending: Cardiology | Admitting: Cardiology

## 2016-07-17 DIAGNOSIS — I4891 Unspecified atrial fibrillation: Secondary | ICD-10-CM | POA: Diagnosis not present

## 2016-07-17 DIAGNOSIS — I481 Persistent atrial fibrillation: Secondary | ICD-10-CM | POA: Insufficient documentation

## 2016-07-17 DIAGNOSIS — M25512 Pain in left shoulder: Secondary | ICD-10-CM | POA: Diagnosis not present

## 2016-07-17 DIAGNOSIS — M25612 Stiffness of left shoulder, not elsewhere classified: Secondary | ICD-10-CM | POA: Diagnosis not present

## 2016-07-17 DIAGNOSIS — I700 Atherosclerosis of aorta: Secondary | ICD-10-CM | POA: Diagnosis not present

## 2016-07-17 DIAGNOSIS — I4819 Other persistent atrial fibrillation: Secondary | ICD-10-CM

## 2016-07-17 DIAGNOSIS — M62512 Muscle wasting and atrophy, not elsewhere classified, left shoulder: Secondary | ICD-10-CM | POA: Diagnosis not present

## 2016-07-17 DIAGNOSIS — Z4789 Encounter for other orthopedic aftercare: Secondary | ICD-10-CM | POA: Diagnosis not present

## 2016-07-17 DIAGNOSIS — R918 Other nonspecific abnormal finding of lung field: Secondary | ICD-10-CM | POA: Diagnosis not present

## 2016-07-17 LAB — BASIC METABOLIC PANEL
BUN/Creatinine Ratio: 26 — ABNORMAL HIGH (ref 10–24)
BUN: 19 mg/dL (ref 8–27)
CALCIUM: 9.4 mg/dL (ref 8.6–10.2)
CHLORIDE: 101 mmol/L (ref 96–106)
CO2: 22 mmol/L (ref 18–29)
Creatinine, Ser: 0.74 mg/dL — ABNORMAL LOW (ref 0.76–1.27)
GFR calc non Af Amer: 96 mL/min/{1.73_m2} (ref >59)
GFR, EST AFRICAN AMERICAN: 111 mL/min/{1.73_m2} (ref >59)
Glucose: 117 mg/dL — ABNORMAL HIGH (ref 65–99)
POTASSIUM: 4.7 mmol/L (ref 3.5–5.2)
Sodium: 142 mmol/L (ref 134–144)

## 2016-07-17 LAB — CBC WITH DIFFERENTIAL/PLATELET
BASOS ABS: 0 10*3/uL (ref 0.0–0.2)
BASOS: 0 %
EOS (ABSOLUTE): 0.3 10*3/uL (ref 0.0–0.4)
Eos: 4 %
Hematocrit: 44.3 % (ref 37.5–51.0)
Hemoglobin: 14.9 g/dL (ref 13.0–17.7)
IMMATURE GRANS (ABS): 0 10*3/uL (ref 0.0–0.1)
Immature Granulocytes: 0 %
LYMPHS ABS: 1.6 10*3/uL (ref 0.7–3.1)
LYMPHS: 24 %
MCH: 30.6 pg (ref 26.6–33.0)
MCHC: 33.6 g/dL (ref 31.5–35.7)
MCV: 91 fL (ref 79–97)
Monocytes Absolute: 0.4 10*3/uL (ref 0.1–0.9)
Monocytes: 6 %
NEUTROS ABS: 4.6 10*3/uL (ref 1.4–7.0)
Neutrophils: 66 %
PLATELETS: 264 10*3/uL (ref 150–379)
RBC: 4.87 x10E6/uL (ref 4.14–5.80)
RDW: 13.2 % (ref 12.3–15.4)
WBC: 7 10*3/uL (ref 3.4–10.8)

## 2016-07-17 IMAGING — CT CT HEART MORPH/PULM VEIN W/ CM & W/O CA SCORE
1 series · 1 of 1 positions shown, 2 images · non-contrast
Comparison: None.

CLINICAL DATA: Atrial fibrillation scheduled for an ablation.

EXAM:
Cardiac CT/CTA
TECHNIQUE: The patient was scanned on a Philips 256 scanner.

[Series 300: laa · 0.58mm/px · 1 of 1 slices shown, 2 images]
[im 1/1  vessel]
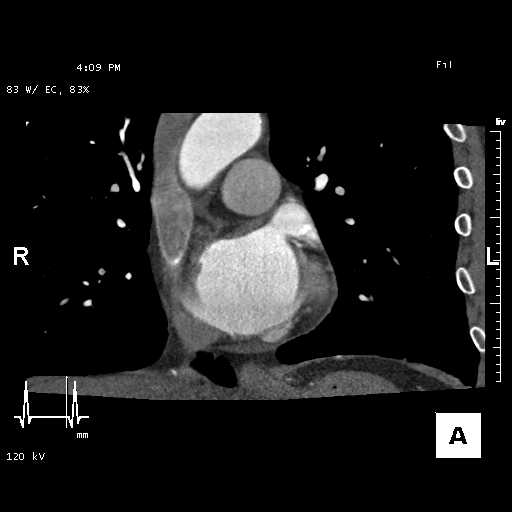
[im 1/1  lung]
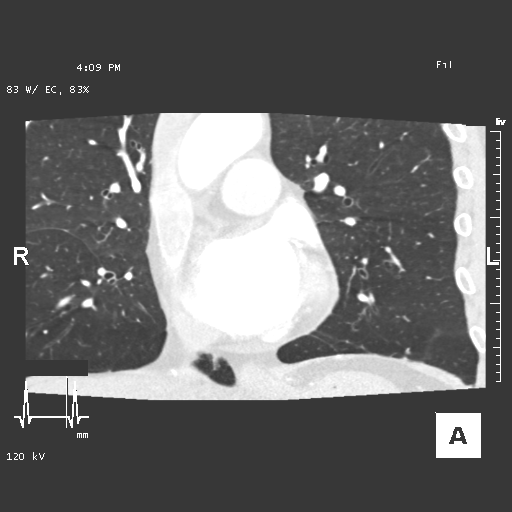

[1 of 1 positions shown; findings below may reference images not displayed]

FINDINGS: A 120 kV prospective scan was triggered in the descending thoracic
aorta at 120 HU's. Gantry rotation speed was 280 msecs and
collimation was .9 mm. I.v. lopressor 10 mg given due to rapid afib.
no NTG was given. The 3D data set was reconstructed at 73%, 78% and
83% of the R-R interval Diastolic phases were analyzed on a
dedicated work station using MPR, MIP and VRT modes. The patient
received 80 cc of contrast.

There was mild LAE. RA was normal. No ASD/PFO No pericardial
effusion. There was significant calcification of the aortic valve
and aortic root atherosclerosis. Aortic root measures 3.7 cm
The pulmonary veins drained normally into the LA with no anomaly
There was a right middle PV. The esophagus coursed closest to the
LLPV ostium.

Calcium Score: Calcium seen in all 3 major epicardial coronary
vessels. Score 39

RUPV:  Ostium 13 mm Area 1.5 cm2

RLPV:  Ostium 12.3 mm Area 1.2 cm2

LUPV:  Ostium 13 mm Area 1.5 cm2

LLPV:  Ostium 17 mm Area 1.5 cm2

The left atrial appendage with no thrombus
IMPRESSION: 1. There is no thrombus in the left atrial appendage.

2.  Mild LAE normal RA

3. The esophagus courses closest to the LLPV ostium

4.  Calcium score 39 which is 38th percentile for sex and age

5.  No pericardial effusion

6. Somewhat diminutive pulmonary veins with no anomaly. Small right
middle pulmonary vein

Sejgi Ametaj

EXAM:
OVER-READ INTERPRETATION  CT CHEST

The following report is an over-read performed by radiologist Dr.
over-read does not include interpretation of cardiac or coronary
anatomy or pathology. The coronary calcium score/coronary CTA
interpretation by the cardiologist is attached.
FINDINGS: A few scattered tiny 2-3 mm pulmonary nodules are noted in the
visualized portions of the thorax, highly nonspecific but
statistically likely benign. Within the visualized thorax there are
no other larger more suspicious appearing pulmonary nodules or
masses, there is no acute consolidative airspace disease, no pleural
effusions, no pneumothorax and no lymphadenopathy. Aortic
atherosclerosis. Visualized portions of the upper abdomen are
unremarkable. There are no aggressive appearing lytic or blastic
lesions noted in the visualized portions of the skeleton.
IMPRESSION: 1. Aortic atherosclerosis.
2. A few scattered 2-3 mm pulmonary nodules are noted in the lungs,
nonspecific, but statistically likely benign. No follow-up needed if
patient is low-risk (and has no known or suspected primary
neoplasm). Non-contrast chest CT can be considered in 12 months if
patient is high-risk. This recommendation follows the consensus
statement: Guidelines for Management of Incidental Pulmonary Nodules
Detected on CT Images: From the [HOSPITAL] 1554; Radiology

## 2016-07-17 MED ORDER — METOPROLOL TARTRATE 5 MG/5ML IV SOLN
5.0000 mg | INTRAVENOUS | Status: AC
  Administered 2016-07-17 (×2): 5 mg via INTRAVENOUS

## 2016-07-17 MED ORDER — IOPAMIDOL (ISOVUE-370) INJECTION 76%
INTRAVENOUS | Status: AC
  Administered 2016-07-17: 80 mL
  Filled 2016-07-17: qty 100

## 2016-07-17 MED ORDER — METOPROLOL TARTRATE 5 MG/5ML IV SOLN
INTRAVENOUS | Status: AC
  Filled 2016-07-17: qty 10

## 2016-07-18 DIAGNOSIS — M62512 Muscle wasting and atrophy, not elsewhere classified, left shoulder: Secondary | ICD-10-CM | POA: Diagnosis not present

## 2016-07-18 DIAGNOSIS — M25612 Stiffness of left shoulder, not elsewhere classified: Secondary | ICD-10-CM | POA: Diagnosis not present

## 2016-07-18 DIAGNOSIS — M25512 Pain in left shoulder: Secondary | ICD-10-CM | POA: Diagnosis not present

## 2016-07-18 DIAGNOSIS — Z4789 Encounter for other orthopedic aftercare: Secondary | ICD-10-CM | POA: Diagnosis not present

## 2016-07-21 DIAGNOSIS — S46012D Strain of muscle(s) and tendon(s) of the rotator cuff of left shoulder, subsequent encounter: Secondary | ICD-10-CM | POA: Diagnosis not present

## 2016-07-21 DIAGNOSIS — Z4789 Encounter for other orthopedic aftercare: Secondary | ICD-10-CM | POA: Diagnosis not present

## 2016-07-22 ENCOUNTER — Ambulatory Visit (HOSPITAL_COMMUNITY): Payer: Medicare Other | Admitting: Certified Registered"

## 2016-07-22 ENCOUNTER — Encounter (HOSPITAL_COMMUNITY)
Admission: RE | Disposition: A | Discharge status: Home / Self Care | Payer: Self-pay | Source: Ambulatory Visit | Attending: Cardiology

## 2016-07-22 ENCOUNTER — Ambulatory Visit (HOSPITAL_COMMUNITY)
Admission: RE | Admit: 2016-07-22 | Discharge: 2016-07-23 | Disposition: A | Discharge status: Home / Self Care | Payer: Medicare Other | Source: Ambulatory Visit | Attending: Cardiology | Admitting: Cardiology

## 2016-07-22 ENCOUNTER — Encounter (HOSPITAL_COMMUNITY): Payer: Self-pay | Admitting: *Deleted

## 2016-07-22 DIAGNOSIS — I4891 Unspecified atrial fibrillation: Secondary | ICD-10-CM | POA: Diagnosis present

## 2016-07-22 DIAGNOSIS — I10 Essential (primary) hypertension: Secondary | ICD-10-CM | POA: Insufficient documentation

## 2016-07-22 DIAGNOSIS — I481 Persistent atrial fibrillation: Secondary | ICD-10-CM | POA: Diagnosis not present

## 2016-07-22 DIAGNOSIS — K219 Gastro-esophageal reflux disease without esophagitis: Secondary | ICD-10-CM | POA: Diagnosis not present

## 2016-07-22 DIAGNOSIS — E785 Hyperlipidemia, unspecified: Secondary | ICD-10-CM | POA: Diagnosis not present

## 2016-07-22 DIAGNOSIS — F329 Major depressive disorder, single episode, unspecified: Secondary | ICD-10-CM | POA: Diagnosis not present

## 2016-07-22 HISTORY — DX: Basal cell carcinoma of skin, unspecified: C44.91

## 2016-07-22 HISTORY — PX: ATRIAL FIBRILLATION ABLATION: EP1191

## 2016-07-22 HISTORY — DX: Reserved for concepts with insufficient information to code with codable children: IMO0002

## 2016-07-22 LAB — POCT ACTIVATED CLOTTING TIME
ACTIVATED CLOTTING TIME: 230 s
Activated Clotting Time: 290 seconds
Activated Clotting Time: 301 seconds
Activated Clotting Time: 164 seconds

## 2016-07-22 SURGERY — ATRIAL FIBRILLATION ABLATION
Anesthesia: General

## 2016-07-22 MED ORDER — ACETAMINOPHEN 325 MG PO TABS: 650.0000 mg | ORAL_TABLET | Freq: Three times a day (TID) | ORAL | Status: DC | PRN

## 2016-07-22 MED ORDER — PHENYLEPHRINE HCL 10 MG/ML IJ SOLN
INTRAMUSCULAR | Status: DC | PRN
  Administered 2016-07-22: 50 ug/min via INTRAVENOUS

## 2016-07-22 MED ORDER — OFF THE BEAT BOOK
Freq: Once | Status: AC
Start: 2016-07-22 — End: 2016-07-22
  Administered 2016-07-22: 20:00:00
  Filled 2016-07-22: qty 1

## 2016-07-22 MED ORDER — ONDANSETRON HCL 4 MG/2ML IJ SOLN: 4.0000 mg | Freq: Four times a day (QID) | INTRAMUSCULAR | Status: DC | PRN

## 2016-07-22 MED ORDER — SIMVASTATIN 20 MG PO TABS
20.0000 mg | ORAL_TABLET | Freq: Every day | ORAL | Status: DC
  Administered 2016-07-22: 20 mg via ORAL
  Filled 2016-07-22: qty 1

## 2016-07-22 MED ORDER — OSTEO BI-FLEX JOINT SHIELD PO TABS: ORAL_TABLET | Freq: Every day | ORAL | Status: DC

## 2016-07-22 MED ORDER — DOBUTAMINE IN D5W 4-5 MG/ML-% IV SOLN
INTRAVENOUS | Status: DC | PRN
  Administered 2016-07-22: 20 ug/kg/min via INTRAVENOUS

## 2016-07-22 MED ORDER — OMEPRAZOLE MAGNESIUM 20 MG PO TBEC: 20.0000 mg | DELAYED_RELEASE_TABLET | Freq: Every day | ORAL | Status: DC

## 2016-07-22 MED ORDER — HEPARIN (PORCINE) IN NACL 2-0.9 UNIT/ML-% IJ SOLN
INTRAMUSCULAR | Status: DC | PRN
  Administered 2016-07-22 (×2): 1000 mL

## 2016-07-22 MED ORDER — MELATONIN 3 MG PO TABS
9.0000 mg | ORAL_TABLET | Freq: Every evening | ORAL | Status: DC | PRN
Start: 2016-07-22 — End: 2016-07-23
  Filled 2016-07-22: qty 3

## 2016-07-22 MED ORDER — FENTANYL CITRATE (PF) 100 MCG/2ML IJ SOLN
INTRAMUSCULAR | Status: DC | PRN
  Administered 2016-07-22 (×4): 50 ug via INTRAVENOUS

## 2016-07-22 MED ORDER — ROCURONIUM BROMIDE 100 MG/10ML IV SOLN
INTRAVENOUS | Status: DC | PRN
  Administered 2016-07-22: 50 mg via INTRAVENOUS

## 2016-07-22 MED ORDER — PROTAMINE SULFATE 10 MG/ML IV SOLN
INTRAVENOUS | Status: DC | PRN
  Administered 2016-07-22: 40 mg via INTRAVENOUS

## 2016-07-22 MED ORDER — ONE-DAILY MULTI VITAMINS PO TABS: 1.0000 | ORAL_TABLET | Freq: Every day | ORAL | Status: DC

## 2016-07-22 MED ORDER — APIXABAN 5 MG PO TABS
5.0000 mg | ORAL_TABLET | Freq: Two times a day (BID) | ORAL | Status: DC
  Administered 2016-07-22 – 2016-07-23 (×2): 5 mg via ORAL
  Filled 2016-07-22 (×2): qty 1

## 2016-07-22 MED ORDER — SODIUM CHLORIDE 0.9 % IV SOLN: 250.0000 mL | INTRAVENOUS | Status: DC | PRN

## 2016-07-22 MED ORDER — PHENYLEPHRINE HCL 10 MG/ML IJ SOLN
INTRAMUSCULAR | Status: DC | PRN
  Administered 2016-07-22 (×2): 80 ug via INTRAVENOUS

## 2016-07-22 MED ORDER — LACTATED RINGERS IV SOLN
INTRAVENOUS | Status: DC | PRN
  Administered 2016-07-22: 13:00:00 via INTRAVENOUS

## 2016-07-22 MED ORDER — DOBUTAMINE IN D5W 4-5 MG/ML-% IV SOLN
INTRAVENOUS | Status: AC
  Filled 2016-07-22: qty 250

## 2016-07-22 MED ORDER — MELATONIN 10 MG PO TABS: 10.0000 mg | ORAL_TABLET | Freq: Every evening | ORAL | Status: DC | PRN

## 2016-07-22 MED ORDER — ACETAMINOPHEN 325 MG PO TABS
650.0000 mg | ORAL_TABLET | ORAL | Status: DC | PRN
  Administered 2016-07-22 – 2016-07-23 (×2): 650 mg via ORAL
  Filled 2016-07-22 (×2): qty 2

## 2016-07-22 MED ORDER — ADULT MULTIVITAMIN W/MINERALS CH
1.0000 | ORAL_TABLET | Freq: Every day | ORAL | Status: DC
  Administered 2016-07-23: 1 via ORAL
  Filled 2016-07-22: qty 1

## 2016-07-22 MED ORDER — HEPARIN (PORCINE) IN NACL 2-0.9 UNIT/ML-% IJ SOLN
INTRAMUSCULAR | Status: AC
  Filled 2016-07-22: qty 500

## 2016-07-22 MED ORDER — ONDANSETRON HCL 4 MG/2ML IJ SOLN
INTRAMUSCULAR | Status: DC | PRN
  Administered 2016-07-22: 4 mg via INTRAVENOUS

## 2016-07-22 MED ORDER — SODIUM CHLORIDE 0.9 % IV SOLN
INTRAVENOUS | Status: DC | PRN
  Administered 2016-07-22: 11:00:00 via INTRAVENOUS

## 2016-07-22 MED ORDER — CITALOPRAM HYDROBROMIDE 20 MG PO TABS
20.0000 mg | ORAL_TABLET | Freq: Every day | ORAL | Status: DC
  Administered 2016-07-23: 20 mg via ORAL
  Filled 2016-07-22: qty 1

## 2016-07-22 MED ORDER — FLUTICASONE PROPIONATE 50 MCG/ACT NA SUSP
1.0000 | Freq: Every day | NASAL | Status: DC | PRN
  Filled 2016-07-22: qty 16

## 2016-07-22 MED ORDER — HEPARIN SODIUM (PORCINE) 1000 UNIT/ML IJ SOLN
INTRAMUSCULAR | Status: DC | PRN
  Administered 2016-07-22: 1000 [IU] via INTRAVENOUS

## 2016-07-22 MED ORDER — MIDAZOLAM HCL 5 MG/5ML IJ SOLN
INTRAMUSCULAR | Status: DC | PRN
  Administered 2016-07-22: 2 mg via INTRAVENOUS

## 2016-07-22 MED ORDER — SODIUM CHLORIDE 0.9% FLUSH: 3.0000 mL | Freq: Two times a day (BID) | INTRAVENOUS | Status: DC

## 2016-07-22 MED ORDER — BUPIVACAINE HCL (PF) 0.25 % IJ SOLN
INTRAMUSCULAR | Status: AC
  Filled 2016-07-22: qty 30

## 2016-07-22 MED ORDER — BUPIVACAINE HCL (PF) 0.25 % IJ SOLN
INTRAMUSCULAR | Status: DC | PRN
  Administered 2016-07-22: 20 mL

## 2016-07-22 MED ORDER — HEPARIN SODIUM (PORCINE) 1000 UNIT/ML IJ SOLN
INTRAMUSCULAR | Status: DC | PRN
  Administered 2016-07-22: 3000 [IU] via INTRAVENOUS
  Administered 2016-07-22: 14000 [IU] via INTRAVENOUS
  Administered 2016-07-22: 7000 [IU] via INTRAVENOUS
  Administered 2016-07-22: 2000 [IU] via INTRAVENOUS

## 2016-07-22 MED ORDER — LIDOCAINE HCL (CARDIAC) 20 MG/ML IV SOLN
INTRAVENOUS | Status: DC | PRN
  Administered 2016-07-22: 50 mg via INTRAVENOUS

## 2016-07-22 MED ORDER — IBUPROFEN 200 MG PO TABS: 400.0000 mg | ORAL_TABLET | Freq: Three times a day (TID) | ORAL | Status: DC | PRN

## 2016-07-22 MED ORDER — HEPARIN SODIUM (PORCINE) 1000 UNIT/ML IJ SOLN
INTRAMUSCULAR | Status: AC
  Filled 2016-07-22: qty 1

## 2016-07-22 MED ORDER — PROPOFOL 10 MG/ML IV BOLUS
INTRAVENOUS | Status: DC | PRN
  Administered 2016-07-22: 200 mg via INTRAVENOUS

## 2016-07-22 MED ORDER — PANTOPRAZOLE SODIUM 40 MG PO TBEC
40.0000 mg | DELAYED_RELEASE_TABLET | Freq: Every day | ORAL | Status: DC
  Administered 2016-07-22 – 2016-07-23 (×2): 40 mg via ORAL
  Filled 2016-07-22 (×2): qty 1

## 2016-07-22 MED ORDER — SODIUM CHLORIDE 0.9% FLUSH: 3.0000 mL | INTRAVENOUS | Status: DC | PRN

## 2016-07-22 SURGICAL SUPPLY — 19 items
BAG SNAP BAND KOVER 36X36 (MISCELLANEOUS) ×3 IMPLANT
BLANKET WARM UNDERBOD FULL ACC (MISCELLANEOUS) ×3 IMPLANT
CATH SMTCH THERMOCOOL SF DF (CATHETERS) ×2 IMPLANT
CATH SOUNDSTAR 3D IMAGING (CATHETERS) ×2 IMPLANT
CATH VARIABLE LASSO NAV 2515 (CATHETERS) ×2 IMPLANT
CATH WEBSTER BI DIR CS D-F CRV (CATHETERS) ×2 IMPLANT
COVER SWIFTLINK CONNECTOR (BAG) ×3 IMPLANT
NDL TRANSSEPTAL BRK 98CM (NEEDLE) IMPLANT
NEEDLE TRANSSEPTAL BRK 98CM (NEEDLE) ×3 IMPLANT
PACK EP LATEX FREE (CUSTOM PROCEDURE TRAY) ×3
PACK EP LF (CUSTOM PROCEDURE TRAY) ×1 IMPLANT
PAD DEFIB LIFELINK (PAD) ×3 IMPLANT
PATCH CARTO3 (PAD) ×2 IMPLANT
SHEATH AGILIS NXT 8.5F 71CM (SHEATH) ×4 IMPLANT
SHEATH AVANTI 11F 11CM (SHEATH) ×2 IMPLANT
SHEATH PINNACLE 7F 10CM (SHEATH) ×2 IMPLANT
SHEATH PINNACLE 8F 10CM (SHEATH) ×4 IMPLANT
SHEATH PINNACLE 9F 10CM (SHEATH) ×4 IMPLANT
TUBING SMART ABLATE COOLFLOW (TUBING) ×2 IMPLANT

## 2016-07-22 NOTE — Discharge Instructions (Signed)
No driving for 4 days. No lifting over 5 lbs for 1 week. No vigorous or sexual activity for 1 week. You may return to work on 07/29/16. Keep procedure site clean & dry. If you notice increased pain, swelling, bleeding or pus, call/return!  You may shower, but no soaking baths/hot tubs/pools for 1 week.   You have an appointment set up with the [facility].  Multiple studies have shown that being followed by a dedicated atrial fibrillation clinic in addition to the standard care you receive from your other physicians improves health. We believe that enrollment in the atrial fibrillation clinic will allow [place] to better care for you.   The phone number to the [facility] is [phone]. The clinic is staffed Monday through Friday from 8:30am to 5pm.  Parking Directions: The clinic is located in the Heart and Vascular Building connected to [facility]. 1)From [address] turn on to [address] and go to the 3rd entrance  (Heart and Vascular entrance) on the right. 2)Look to the right for Heart &Vascular Parking Garage. 3)A code for the entrance is required please call the clinic to receive this.   4)Take the elevators to the 1st floor. Registration is in the room with the glass walls at the end of the hallway.  If you have any trouble parking or locating the clinic, please dont hesitate to call [phone].

## 2016-07-22 NOTE — Discharge Summary (Signed)
ELECTROPHYSIOLOGY PROCEDURE DISCHARGE SUMMARY    Patient ID: Jon Jackson,  MRN: [id number], DOB/AGE: Jul 12, 1950 66 y.o.  Admit date: 07/22/2016 Discharge date: 07/23/2016  Primary Care Physician: [name], MD  Primary Cardiologist: Dr. Duard Brady Electrophysiologist: Dr. Curt Bears  Primary Discharge Diagnosis:  1. Persistent AFib     CHA2DS2Vasc is 2, on Eliquis  Secondary Discharge Diagnosis:  1. HTN 2. HLD  Procedures This Admission:  1.  Electrophysiology study and radiofrequency catheter ablation on 07/22/16 by Dr Curt Bears.   This study demonstrated:   CONCLUSIONS: 1. Sinus rhythm upon presentation.   2. Successful electrical isolation and anatomical encircling of all four pulmonary veins with radiofrequency current with ablation of a second discrete focus. 3. Cardioversion 4. No inducible arrhythmias following ablation both on and off of dobuatmine 5. No early apparent complications.  Brief HPI: Jon Jackson is a 66 y.o. male with a history of persistent atrial fibrillation.  Risks, benefits, and alternatives to catheter ablation of atrial fibrillation were reviewed with the patient who wished to proceed.  The patient underwent cardiac CT prior to the procedure which demonstrated no LAA thrombus.    Hospital Course:  The patient was admitted and underwent EPS/RFCA of atrial fibrillation with details as outlined above.  They were monitored on telemetry overnight which demonstrated sinus rhythm.  Right groin was without complication on the day of discharge.  The patient was examined by Dr. Lahoma Crocker and considered to be stable for discharge.  Wound care and restrictions were reviewed with the patient.  The patient Jon Jackson be seen back by Roderic Palau, NP in 4 weeks and DrCamnitz in 12 weeks for post ablation follow up.     Physical Exam: Vitals:   07/22/16 1615 07/22/16 2000 07/22/16 2012 07/23/16 0649  BP: 126/83  (!) 94/59 129/73  Pulse: 82 85 78 72  Resp:  10 10 13 12   Temp: 99.6 F (37.6 C)  98.9 F (37.2 C) 98.4 F (36.9 C)  TempSrc: Oral  Oral Oral  SpO2: 96% 98% 96% 95%  Weight:    222 lb 10.6 oz (101 kg)  Height:         GEN- The patient is well appearing, alert and oriented x 3 today.   HEENT: normocephalic, atraumatic; sclera clear, conjunctiva pink; hearing intact; oropharynx clear; neck supple  Lungs-  CTA b/l, normal work of breathing.  No wheezes, rales, rhonchi Heart-  RRR, no murmurs, rubs or gallops  GI- soft, non-tender, non-distended, bowel sounds present  Extremities- no clubbing, cyanosis, or edema; DP/PT/radial pulses 2+ bilaterally, groin without hematoma/bruit MS- no significant deformity or atrophy Skin- warm and dry, no rash or lesion Psych- euthymic mood, full affect Neuro- strength and sensation are intact   Labs:   Lab Results  Component Value Date   WBC 7.0 07/16/2016   HGB 14.9 05/08/2016   HCT 44.3 07/16/2016   MCV 91 07/16/2016   PLT 264 07/16/2016     Recent Labs Lab 07/16/16 1332  NA 142  K 4.7  CL 101  CO2 22  BUN 19  CREATININE 0.74*  CALCIUM 9.4  GLUCOSE 117*     Discharge Medications:  Allergies as of 07/23/2016   No Known Allergies     Medication List    TAKE these medications   acetaminophen 500 MG tablet Commonly known as:  TYLENOL Take 1,000-1,500 mg by mouth 3 (three) times daily as needed for moderate pain or headache.   ALLERGY EYE OP Place  1-2 drops into both eyes 3 (three) times daily as needed (for seasonal allergy/itchy eyes).   apixaban 5 MG Tabs tablet Commonly known as:  ELIQUIS Take 1 tablet (5 mg total) by mouth 2 (two) times daily.   citalopram 20 MG tablet Commonly known as:  CELEXA TAKE ONE TABLET BY MOUTH ONCE DAILY   fluticasone 50 MCG/ACT nasal spray Commonly known as:  FLONASE Place 1 spray into both nostrils daily as needed for allergies or rhinitis.   ibuprofen 200 MG tablet Commonly known as:  ADVIL,MOTRIN Take 400-600 mg by mouth  3 (three) times daily as needed for headache or moderate pain.   Melatonin 10 MG Tabs Take 10 mg by mouth at bedtime as needed (sleep).   multivitamin tablet Take 1 tablet by mouth daily.   omeprazole 20 MG tablet Commonly known as:  PRILOSEC OTC Take 20 mg by mouth daily.   omeprazole 40 MG capsule Commonly known as:  PRILOSEC TAKE 1 CAPSULE (40 MG TOTAL) BY MOUTH DAILY.   OSTEO BI-FLEX JOINT SHIELD PO Take 2 tablets by mouth daily.   simvastatin 20 MG tablet Commonly known as:  ZOCOR TAKE ONE TABLET BY MOUTH ONCE DAILY What changed:  See the new instructions.       Disposition:  Home Discharge Instructions    Diet - low sodium heart healthy    Complete by:  As directed    Increase activity slowly    Complete by:  As directed      Follow-up Information    [facility] Follow up on 08/20/2016.   Specialty:  Cardiology Why:  3:00PM Contact information: [address] [id number] [place] [phone]       Scorp[facility] Fortin Meredith Leeds, MD Follow up on 10/28/2016.   Specialty:  Cardiology Why:  3:30PM Contact information: [place] [id number] [phone]           Duration of Discharge Encounter: Greater than 30 minutes including physician time.  Signed, Chanetta Marshall, NP  07/23/2016 7:36 AM  I have seen and examined this patient with Chanetta Marshall.  Agree with above, note added to reflect my findings.  On exam, RRR, no murmurs, lungs clear. Presented to the hospital in atrial fibrillation. Had AF ablation and remains in sinus rhythm. Plan for discharge today with follow up in AF clinic.    Finneus Kaneshiro M. Jovanne Riggenbach MD 07/23/2016 7:42 AM

## 2016-07-22 NOTE — Progress Notes (Addendum)
Site area: RFV x 2 Site Prior to Removal:  Level 0 Pressure Applied For:26 min Manual:  yes  Patient Status During Pull:  stable Post Pull Site:  Level 0 Post Pull Instructions Given:yes   Post Pull Pulses Present: palpable Dressing Applied: tegaderm  Bedrest begins @ 9022 till 2140 Comments:

## 2016-07-22 NOTE — Care Management Note (Signed)
Case Management Note  Patient Details  Name: AHAAN ZOBRIST MRN: [id number] Date of Birth: [birth year]  Subjective/Objective:   s/p    Atrial Fibrillation Ablation.              Action/Plan: NCM will follow for dc needs.   Expected Discharge Date:                  Expected Discharge Plan:     In-House Referral:     Discharge planning Services  CM Consult  Post Acute Care Choice:    Choice offered to:     DME Arranged:    DME Agency:     HH Arranged:    HH Agency:     Status of Service:  In process, will continue to follow  If discussed at Long Length of Stay Meetings, dates discussed:    Additional Comments:  Zenon Mayo, RN 07/22/2016, 4:49 PM

## 2016-07-22 NOTE — H&P (Signed)
Jon Jackson is a 66 y.o. male with a history of atrial fibrillation. He presents to the hosptial today for atrial fibrillation ablation. He has been complaint with his eliquis. On exam, iRRR, no murmurs, lungs clear. Risks and benefits explained. Risks include but not limited to bleeding, tamponade, heart block, stroke, and damage to surrounding organs. He understands these risks and has agreed to the procedure.  Will Curt Bears, MD 07/22/2016 10:31 AM

## 2016-07-22 NOTE — Anesthesia Procedure Notes (Deleted)
Performed by: Chibuike Fleek S     

## 2016-07-22 NOTE — Anesthesia Postprocedure Evaluation (Signed)
Anesthesia Post Note  Patient: Jon Jackson  Procedure(s) Performed: Procedure(s) (LRB): Atrial Fibrillation Ablation (N/A)  Patient location during evaluation: PACU Anesthesia Type: General Level of consciousness: awake and alert and patient cooperative Pain management: pain level controlled Vital Signs Assessment: post-procedure vital signs reviewed and stable Respiratory status: spontaneous breathing and respiratory function stable Cardiovascular status: stable Anesthetic complications: no       Last Vitals:  Vitals:   07/22/16 1500 07/22/16 1541  BP: 108/70   Pulse: 80   Resp: 12   Temp:  36.5 C    Last Pain:  Vitals:   07/22/16 1541  TempSrc: Temporal                 Mattheo Swindle S

## 2016-07-22 NOTE — Transfer of Care (Signed)
Immediate Anesthesia Transfer of Care Note  Patient: Jon Jackson  Procedure(s) Performed: Procedure(s): Atrial Fibrillation Ablation (N/A)  Patient Location: Cath Lab  Anesthesia Type:General  Level of Consciousness: awake, alert  and oriented  Airway & Oxygen Therapy: Patient Spontanous Breathing and Patient connected to nasal cannula oxygen  Post-op Assessment: Report given to RN, Post -op Vital signs reviewed and stable and Patient moving all extremities X 4  Post vital signs: Reviewed and stable  Last Vitals:  Vitals:   07/22/16 1410 07/22/16 1420  BP: 110/74 107/68  Pulse: 82 79  Resp: 14 10  Temp:      Last Pain:  Vitals:   07/22/16 1409  TempSrc: Temporal      Patients Stated Pain Goal: 5 (53/66/44 0347)  Complications: No apparent anesthesia complications

## 2016-07-22 NOTE — Anesthesia Preprocedure Evaluation (Signed)
Anesthesia Evaluation  Patient identified by MRN, date of birth, ID band Patient awake    Reviewed: Allergy & Precautions, H&P , NPO status , Patient's Chart, lab work & pertinent test results  Airway Mallampati: II   Neck ROM: full    Dental   Pulmonary former smoker,    breath sounds clear to auscultation       Cardiovascular negative cardio ROS   Rhythm:regular Rate:Normal     Neuro/Psych PSYCHIATRIC DISORDERS Anxiety Depression    GI/Hepatic GERD  ,  Endo/Other    Renal/GU      Musculoskeletal  (+) Arthritis ,   Abdominal   Peds  Hematology   Anesthesia Other Findings   Reproductive/Obstetrics                             Anesthesia Physical Anesthesia Plan  ASA: III  Anesthesia Plan: General   Post-op Pain Management:    Induction: Intravenous  Airway Management Planned: Oral ETT  Additional Equipment:   Intra-op Plan:   Post-operative Plan: Extubation in OR  Informed Consent: I have reviewed the patients History and Physical, chart, labs and discussed the procedure including the risks, benefits and alternatives for the proposed anesthesia with the patient or authorized representative who has indicated his/her understanding and acceptance.     Plan Discussed with: CRNA, Anesthesiologist and Surgeon  Anesthesia Plan Comments:         Anesthesia Quick Evaluation

## 2016-07-22 NOTE — Progress Notes (Addendum)
Site area: LFV x 2 Site Prior to Removal:  Level 0 Pressure Applied For:20 min Manual: yes   Patient Status During Pull:  stable Post Pull Site:  Level 0 Post Pull Instructions Given:yes   Post Pull Pulses Present: palpable Dressing Applied:  tegaderm Bedrest begins @ 0110 till 2140 Comments:

## 2016-07-22 NOTE — Anesthesia Procedure Notes (Signed)
Procedure Name: Intubation Date/Time: 07/22/2016 11:12 AM Performed by: Mariea Clonts Pre-anesthesia Checklist: Patient identified, Emergency Drugs available, Suction available and Patient being monitored Patient Re-evaluated:Patient Re-evaluated prior to inductionOxygen Delivery Method: Circle System Utilized Preoxygenation: Pre-oxygenation with 100% oxygen Intubation Type: IV induction Ventilation: Mask ventilation without difficulty Laryngoscope Size: Mac and 4 Grade View: Grade I Tube type: Oral Tube size: 7.5 mm Number of attempts: 1 Airway Equipment and Method: Stylet and Oral airway Placement Confirmation: ETT inserted through vocal cords under direct vision,  positive ETCO2 and breath sounds checked- equal and bilateral Secured at: 23 cm Tube secured with: Tape Dental Injury: Teeth and Oropharynx as per pre-operative assessment

## 2016-07-23 ENCOUNTER — Encounter (HOSPITAL_COMMUNITY): Payer: Self-pay | Admitting: Cardiology

## 2016-07-23 DIAGNOSIS — E785 Hyperlipidemia, unspecified: Secondary | ICD-10-CM | POA: Diagnosis not present

## 2016-07-23 DIAGNOSIS — I481 Persistent atrial fibrillation: Secondary | ICD-10-CM

## 2016-07-23 DIAGNOSIS — I10 Essential (primary) hypertension: Secondary | ICD-10-CM | POA: Diagnosis not present

## 2016-07-23 NOTE — Care Management Note (Signed)
Case Management Note  Patient Details  Name: Jon Jackson MRN: [id number] Date of Birth: 17-Aug-1950  Subjective/Objective:   s/p afib ablation, for dc today. No needs.                 Action/Plan:   Expected Discharge Date:  07/23/16               Expected Discharge Plan:  Home/Self Care  In-House Referral:     Discharge planning Services  CM Consult  Post Acute Care Choice:    Choice offered to:     DME Arranged:    DME Agency:     HH Arranged:    HH Agency:     Status of Service:  Completed, signed off  If discussed at [employer] of Stay Meetings, dates discussed:    Additional Comments:  Jon Mayo, RN 07/23/2016, 11:07 AM

## 2016-07-28 ENCOUNTER — Other Ambulatory Visit: Payer: Self-pay | Admitting: Family Medicine

## 2016-07-31 DIAGNOSIS — Z4789 Encounter for other orthopedic aftercare: Secondary | ICD-10-CM | POA: Diagnosis not present

## 2016-07-31 DIAGNOSIS — M25512 Pain in left shoulder: Secondary | ICD-10-CM | POA: Diagnosis not present

## 2016-07-31 DIAGNOSIS — M25612 Stiffness of left shoulder, not elsewhere classified: Secondary | ICD-10-CM | POA: Diagnosis not present

## 2016-07-31 DIAGNOSIS — M62512 Muscle wasting and atrophy, not elsewhere classified, left shoulder: Secondary | ICD-10-CM | POA: Diagnosis not present

## 2016-08-01 DIAGNOSIS — M25612 Stiffness of left shoulder, not elsewhere classified: Secondary | ICD-10-CM | POA: Diagnosis not present

## 2016-08-01 DIAGNOSIS — M25512 Pain in left shoulder: Secondary | ICD-10-CM | POA: Diagnosis not present

## 2016-08-01 DIAGNOSIS — M62512 Muscle wasting and atrophy, not elsewhere classified, left shoulder: Secondary | ICD-10-CM | POA: Diagnosis not present

## 2016-08-01 DIAGNOSIS — Z4789 Encounter for other orthopedic aftercare: Secondary | ICD-10-CM | POA: Diagnosis not present

## 2016-08-05 DIAGNOSIS — Z4789 Encounter for other orthopedic aftercare: Secondary | ICD-10-CM | POA: Diagnosis not present

## 2016-08-05 DIAGNOSIS — M25612 Stiffness of left shoulder, not elsewhere classified: Secondary | ICD-10-CM | POA: Diagnosis not present

## 2016-08-05 DIAGNOSIS — M25512 Pain in left shoulder: Secondary | ICD-10-CM | POA: Diagnosis not present

## 2016-08-05 DIAGNOSIS — M62512 Muscle wasting and atrophy, not elsewhere classified, left shoulder: Secondary | ICD-10-CM | POA: Diagnosis not present

## 2016-08-07 DIAGNOSIS — M25612 Stiffness of left shoulder, not elsewhere classified: Secondary | ICD-10-CM | POA: Diagnosis not present

## 2016-08-07 DIAGNOSIS — M62512 Muscle wasting and atrophy, not elsewhere classified, left shoulder: Secondary | ICD-10-CM | POA: Diagnosis not present

## 2016-08-07 DIAGNOSIS — Z4789 Encounter for other orthopedic aftercare: Secondary | ICD-10-CM | POA: Diagnosis not present

## 2016-08-07 DIAGNOSIS — M25512 Pain in left shoulder: Secondary | ICD-10-CM | POA: Diagnosis not present

## 2016-08-12 DIAGNOSIS — M25512 Pain in left shoulder: Secondary | ICD-10-CM | POA: Diagnosis not present

## 2016-08-12 DIAGNOSIS — M62512 Muscle wasting and atrophy, not elsewhere classified, left shoulder: Secondary | ICD-10-CM | POA: Diagnosis not present

## 2016-08-12 DIAGNOSIS — Z4789 Encounter for other orthopedic aftercare: Secondary | ICD-10-CM | POA: Diagnosis not present

## 2016-08-12 DIAGNOSIS — M25612 Stiffness of left shoulder, not elsewhere classified: Secondary | ICD-10-CM | POA: Diagnosis not present

## 2016-08-15 DIAGNOSIS — Z4789 Encounter for other orthopedic aftercare: Secondary | ICD-10-CM | POA: Diagnosis not present

## 2016-08-15 DIAGNOSIS — M25512 Pain in left shoulder: Secondary | ICD-10-CM | POA: Diagnosis not present

## 2016-08-15 DIAGNOSIS — M62512 Muscle wasting and atrophy, not elsewhere classified, left shoulder: Secondary | ICD-10-CM | POA: Diagnosis not present

## 2016-08-15 DIAGNOSIS — M25612 Stiffness of left shoulder, not elsewhere classified: Secondary | ICD-10-CM | POA: Diagnosis not present

## 2016-08-19 ENCOUNTER — Other Ambulatory Visit: Payer: Self-pay | Admitting: *Deleted

## 2016-08-19 MED ORDER — SIMVASTATIN 20 MG PO TABS: 20.0000 mg | ORAL_TABLET | Freq: Every day | ORAL | 0 refills | Status: DC

## 2016-08-20 ENCOUNTER — Encounter (HOSPITAL_COMMUNITY): Payer: Self-pay | Admitting: Nurse Practitioner

## 2016-08-20 ENCOUNTER — Ambulatory Visit (HOSPITAL_COMMUNITY)
Admission: RE | Admit: 2016-08-20 | Discharge: 2016-08-20 | Disposition: A | Discharge status: Home / Self Care | Payer: Medicare Other | Source: Ambulatory Visit | Attending: Nurse Practitioner | Admitting: Nurse Practitioner

## 2016-08-20 VITALS — BP 136/76 | HR 69 | Ht 70.0 in | Wt 225.2 lb

## 2016-08-20 DIAGNOSIS — E785 Hyperlipidemia, unspecified: Secondary | ICD-10-CM | POA: Diagnosis not present

## 2016-08-20 DIAGNOSIS — I481 Persistent atrial fibrillation: Secondary | ICD-10-CM

## 2016-08-20 DIAGNOSIS — Z87891 Personal history of nicotine dependence: Secondary | ICD-10-CM | POA: Diagnosis not present

## 2016-08-20 DIAGNOSIS — Z7901 Long term (current) use of anticoagulants: Secondary | ICD-10-CM | POA: Insufficient documentation

## 2016-08-20 DIAGNOSIS — F329 Major depressive disorder, single episode, unspecified: Secondary | ICD-10-CM | POA: Insufficient documentation

## 2016-08-20 DIAGNOSIS — M1990 Unspecified osteoarthritis, unspecified site: Secondary | ICD-10-CM | POA: Diagnosis not present

## 2016-08-20 DIAGNOSIS — F419 Anxiety disorder, unspecified: Secondary | ICD-10-CM | POA: Insufficient documentation

## 2016-08-20 DIAGNOSIS — Z9889 Other specified postprocedural states: Secondary | ICD-10-CM | POA: Diagnosis not present

## 2016-08-20 DIAGNOSIS — I4891 Unspecified atrial fibrillation: Secondary | ICD-10-CM | POA: Insufficient documentation

## 2016-08-20 DIAGNOSIS — Z85828 Personal history of other malignant neoplasm of skin: Secondary | ICD-10-CM | POA: Diagnosis not present

## 2016-08-20 DIAGNOSIS — I4819 Other persistent atrial fibrillation: Secondary | ICD-10-CM

## 2016-08-20 DIAGNOSIS — K219 Gastro-esophageal reflux disease without esophagitis: Secondary | ICD-10-CM | POA: Diagnosis not present

## 2016-08-20 NOTE — Progress Notes (Signed)
Primary Care Physician: Mikey Kirschner, MD Cardiologist:Dr. 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 male with a h/o afib ablation 4/24 in the afib clinic for evaluation. He has been very pleased and has not had any awareness of afib since the procedure. No swallowing or rt groin difficulties.  Today, he denies symptoms of palpitations, chest pain, shortness of breath, orthopnea, PND, lower extremity edema, dizziness, presyncope, syncope, or neurologic sequela. The patient is tolerating medications without difficulties and is otherwise without complaint today.   Past Medical History:  Diagnosis Date  . Anxiety   . Arthritis    "hands" (07/22/2016)  . Atrial fibrillation ([place])   . Basal cell carcinoma   . Depression   . GERD (gastroesophageal reflux disease)   . Hyperlipidemia   . Squamous carcinoma    Past Surgical History:  Procedure Laterality Date  . ATRIAL FIBRILLATION ABLATION  07/22/2016  . ATRIAL FIBRILLATION ABLATION N/A 07/22/2016   Procedure: Atrial Fibrillation Ablation;  Surgeon: Will Meredith Leeds, MD;  Location: [facility];  Service: Cardiovascular;  Laterality: N/A;  . BASAL CELL CARCINOMA EXCISION     forehead; top of scalp  . CARDIOVERSION N/A 04/07/2016   Procedure: CARDIOVERSION;  Surgeon: Satira Sark, MD;  Location: [facility];  Service: Cardiovascular;  Laterality: N/A;  . COLECTOMY  1990s   removal of section due to strangulated bowel.  . COLONOSCOPY N/A 05/30/2013   Procedure: COLONOSCOPY;  Surgeon: [name], MD;  Location: [facility];  Service: Endoscopy;  Laterality: N/A;  8:45 AM  . COLONOSCOPY    . INGUINAL HERNIA REPAIR Right 1990s  . SHOULDER ARTHROSCOPY WITH ROTATOR CUFF REPAIR AND SUBACROMIAL DECOMPRESSION Left 05/15/2016   Procedure: LEFT SHOULDER ARTHROSCOPY WITH ROTATOR CUFF REPAIR AND SUBACROMIAL DECOMPRESSION AND DISTAL CLAVICLE RESECTION;  Surgeon: [name], MD;  Location: [facility];  Service:  Orthopedics;  Laterality: Left;  requests 2hrs  . SQUAMOUS CELL CARCINOMA EXCISION     scalp  . TONSILLECTOMY      Current Outpatient Prescriptions  Medication Sig Dispense Refill  . acetaminophen (TYLENOL) 500 MG tablet Take 1,000-1,500 mg by mouth 3 (three) times daily as needed for moderate pain or headache.    Marland Kitchen apixaban (ELIQUIS) 5 MG TABS tablet Take 1 tablet (5 mg total) by mouth 2 (two) times daily. 60 tablet 6  . citalopram (CELEXA) 20 MG tablet TAKE ONE TABLET BY MOUTH ONCE DAILY 90 tablet 1  . fluticasone (FLONASE) 50 MCG/ACT nasal spray Place 1 spray into both nostrils daily as needed for allergies or rhinitis.    Marland Kitchen ibuprofen (ADVIL,MOTRIN) 200 MG tablet Take 400-600 mg by mouth 3 (three) times daily as needed for headache or moderate pain.    . Melatonin 10 MG TABS Take 10 mg by mouth at bedtime as needed (sleep).    . Misc Natural Products (OSTEO BI-FLEX JOINT SHIELD PO) Take 2 tablets by mouth daily.     [name] Fill (ALLERGY EYE OP) Place 1-2 drops into both eyes 3 (three) times daily as needed (for seasonal allergy/itchy eyes).    Marland Kitchen omeprazole (PRILOSEC) 40 MG capsule TAKE 1 CAPSULE (40 MG TOTAL) BY MOUTH DAILY. 90 capsule 1  . simvastatin (ZOCOR) 20 MG tablet Take 1 tablet (20 mg total) by mouth daily. 90 tablet 0  . Multiple Vitamin (MULTIVITAMIN) tablet Take 1 tablet by mouth daily.     No current facility-administered medications for this encounter.  No Known Allergies  Social History   Social History  . Marital status: Married    Spouse name: N/A  . Number of children: N/A  . Years of education: N/A   Occupational History  . Not on file.   Social History Main Topics  . Smoking status: Former Smoker    Packs/day: 1.00    Years: 47.00    Types: Cigars    Quit date: 02/01/2016  . Smokeless tobacco: Never Used  . Alcohol use 7.2 oz/week    12 Cans of beer per week  . Drug use: No  . Sexual activity: Yes   Other Topics Concern  . Not on  file   Social History Narrative  . No narrative on file    Family History  Problem Relation Age of Onset  . Colon cancer Brother     ROS- All systems are reviewed and negative except as per the HPI above  Physical Exam: Vitals:   08/20/16 1503  BP: 136/76  Pulse: 69  Weight: 225 lb 3.2 oz (102.2 kg)  Height: 5\' 10"  (1.778 m)   Wt Readings from Last 3 Encounters:  08/20/16 225 lb 3.2 oz (102.2 kg)  07/23/16 222 lb 10.6 oz (101 kg)  06/30/16 226 lb 12.8 oz (102.9 kg)    Labs: Lab Results  Component Value Date   NA 142 07/16/2016   K 4.7 07/16/2016   CL 101 07/16/2016   CO2 22 07/16/2016   GLUCOSE 117 (H) 07/16/2016   BUN 19 07/16/2016   CREATININE 0.74 (L) 07/16/2016   CALCIUM 9.4 07/16/2016   Lab Results  Component Value Date   INR 0.94 05/08/2016   Lab Results  Component Value Date   CHOL 151 02/13/2016   HDL 41 02/13/2016   LDLCALC 89 02/13/2016   TRIG 103 02/13/2016     GEN- The patient is well appearing, alert and oriented x 3 today.   Head- normocephalic, atraumatic Eyes-  Sclera clear, conjunctiva pink Ears- hearing intact Oropharynx- clear Neck- supple, no JVP Lymph- no cervical lymphadenopathy Lungs- Clear to ausculation bilaterally, normal work of breathing Heart- Regular rate and rhythm, no murmurs, rubs or gallops, PMI not laterally displaced GI- soft, NT, ND, + BS Extremities- no clubbing, cyanosis, or edema MS- no significant deformity or atrophy Skin- no rash or lesion Psych- euthymic mood, full affect Neuro- strength and sensation are intact  EKG- NSR at 69 bpm, Pr int 152 ms, qrs int 98 ms, qtc 420 ms Epic records reviewed    Assessment and Plan: 1. Afib s/p ablation Enjoying SR since procedure Continue apixaban 5 mg bid without missed doses Continue with normal activites  F/u with Dr. Curt Bears 7/31, Dr. Domenic Polite 6/12  Butch Penny C. Carroll, [facility] [address] [place], [place]  [id number] [phone]

## 2016-08-22 DIAGNOSIS — M25612 Stiffness of left shoulder, not elsewhere classified: Secondary | ICD-10-CM | POA: Diagnosis not present

## 2016-08-22 DIAGNOSIS — Z4789 Encounter for other orthopedic aftercare: Secondary | ICD-10-CM | POA: Diagnosis not present

## 2016-08-22 DIAGNOSIS — M25512 Pain in left shoulder: Secondary | ICD-10-CM | POA: Diagnosis not present

## 2016-08-22 DIAGNOSIS — M62512 Muscle wasting and atrophy, not elsewhere classified, left shoulder: Secondary | ICD-10-CM | POA: Diagnosis not present

## 2016-08-26 DIAGNOSIS — M62512 Muscle wasting and atrophy, not elsewhere classified, left shoulder: Secondary | ICD-10-CM | POA: Diagnosis not present

## 2016-08-26 DIAGNOSIS — M25512 Pain in left shoulder: Secondary | ICD-10-CM | POA: Diagnosis not present

## 2016-08-26 DIAGNOSIS — Z4789 Encounter for other orthopedic aftercare: Secondary | ICD-10-CM | POA: Diagnosis not present

## 2016-08-26 DIAGNOSIS — M25612 Stiffness of left shoulder, not elsewhere classified: Secondary | ICD-10-CM | POA: Diagnosis not present

## 2016-08-28 DIAGNOSIS — M25612 Stiffness of left shoulder, not elsewhere classified: Secondary | ICD-10-CM | POA: Diagnosis not present

## 2016-08-28 DIAGNOSIS — M62512 Muscle wasting and atrophy, not elsewhere classified, left shoulder: Secondary | ICD-10-CM | POA: Diagnosis not present

## 2016-08-28 DIAGNOSIS — M25512 Pain in left shoulder: Secondary | ICD-10-CM | POA: Diagnosis not present

## 2016-08-28 DIAGNOSIS — Z4789 Encounter for other orthopedic aftercare: Secondary | ICD-10-CM | POA: Diagnosis not present

## 2016-09-02 DIAGNOSIS — M25512 Pain in left shoulder: Secondary | ICD-10-CM | POA: Diagnosis not present

## 2016-09-02 DIAGNOSIS — M25612 Stiffness of left shoulder, not elsewhere classified: Secondary | ICD-10-CM | POA: Diagnosis not present

## 2016-09-02 DIAGNOSIS — Z4789 Encounter for other orthopedic aftercare: Secondary | ICD-10-CM | POA: Diagnosis not present

## 2016-09-02 DIAGNOSIS — M62512 Muscle wasting and atrophy, not elsewhere classified, left shoulder: Secondary | ICD-10-CM | POA: Diagnosis not present

## 2016-09-04 DIAGNOSIS — Z4789 Encounter for other orthopedic aftercare: Secondary | ICD-10-CM | POA: Diagnosis not present

## 2016-09-04 DIAGNOSIS — M62512 Muscle wasting and atrophy, not elsewhere classified, left shoulder: Secondary | ICD-10-CM | POA: Diagnosis not present

## 2016-09-04 DIAGNOSIS — M25612 Stiffness of left shoulder, not elsewhere classified: Secondary | ICD-10-CM | POA: Diagnosis not present

## 2016-09-04 DIAGNOSIS — M25512 Pain in left shoulder: Secondary | ICD-10-CM | POA: Diagnosis not present

## 2016-09-08 ENCOUNTER — Encounter: Payer: Self-pay | Admitting: Cardiology

## 2016-09-08 NOTE — Progress Notes (Deleted)
Cardiology Office Note  Date: 09/08/2016   ID: [name], DOB 09-06-1950, MRN [id number]  PCP: Mikey Kirschner, MD  Primary Cardiologist: [name], MD   No chief complaint on file.   History of Present Illness: Jon Jackson is a 66 y.o. male last seen in March and subsequently referred for EP consultation to discuss treatment options for paroxysmal to persistent atrial fibrillation. He was seen by Dr. Curt Bears and underwent atrial fibrillation ablation in late April and has done well since that time.  He continues on Eliquis for stroke prophylaxis.  Past Medical History:  Diagnosis Date  . Anxiety   . Arthritis    "hands" (07/22/2016)  . Atrial fibrillation ([place])   . Basal cell carcinoma   . Depression   . GERD (gastroesophageal reflux disease)   . Hyperlipidemia   . Squamous carcinoma     Past Surgical History:  Procedure Laterality Date  . ATRIAL FIBRILLATION ABLATION  07/22/2016  . ATRIAL FIBRILLATION ABLATION N/A 07/22/2016   Procedure: Atrial Fibrillation Ablation;  Surgeon: Will Meredith Leeds, MD;  Location: [facility];  Service: Cardiovascular;  Laterality: N/A;  . BASAL CELL CARCINOMA EXCISION     forehead; top of scalp  . CARDIOVERSION N/A 04/07/2016   Procedure: CARDIOVERSION;  Surgeon: Satira Sark, MD;  Location: [facility];  Service: Cardiovascular;  Laterality: N/A;  . COLECTOMY  1990s   removal of section due to strangulated bowel.  . COLONOSCOPY N/A 05/30/2013   Procedure: COLONOSCOPY;  Surgeon: [name], MD;  [facility];  Service: Endoscopy;  Laterality: N/A;  8:45 AM  . COLONOSCOPY    . INGUINAL HERNIA REPAIR Right 1990s  . SHOULDER ARTHROSCOPY WITH ROTATOR CUFF REPAIR AND SUBACROMIAL DECOMPRESSION Left 05/15/2016   Procedure: LEFT SHOULDER ARTHROSCOPY WITH ROTATOR CUFF REPAIR AND SUBACROMIAL DECOMPRESSION AND DISTAL CLAVICLE RESECTION;  Surgeon: [name], MD;  Location: [facility];  Service: Orthopedics;   Laterality: Left;  requests 2hrs  . SQUAMOUS CELL CARCINOMA EXCISION     scalp  . TONSILLECTOMY      Current Outpatient Prescriptions  Medication Sig Dispense Refill  . acetaminophen (TYLENOL) 500 MG tablet Take 1,000-1,500 mg by mouth 3 (three) times daily as needed for moderate pain or headache.    Marland Kitchen apixaban (ELIQUIS) 5 MG TABS tablet Take 1 tablet (5 mg total) by mouth 2 (two) times daily. 60 tablet 6  . citalopram (CELEXA) 20 MG tablet TAKE ONE TABLET BY MOUTH ONCE DAILY 90 tablet 1  . fluticasone (FLONASE) 50 MCG/ACT nasal spray Place 1 spray into both nostrils daily as needed for allergies or rhinitis.    Marland Kitchen ibuprofen (ADVIL,MOTRIN) 200 MG tablet Take 400-600 mg by mouth 3 (three) times daily as needed for headache or moderate pain.    . Melatonin 10 MG TABS Take 10 mg by mouth at bedtime as needed (sleep).    . Misc Natural Products (OSTEO BI-FLEX JOINT SHIELD PO) Take 2 tablets by mouth daily.     . Multiple Vitamin (MULTIVITAMIN) tablet Take 1 tablet by mouth daily.    [name] Fill (ALLERGY EYE OP) Place 1-2 drops into both eyes 3 (three) times daily as needed (for seasonal allergy/itchy eyes).    Marland Kitchen omeprazole (PRILOSEC) 40 MG capsule TAKE 1 CAPSULE (40 MG TOTAL) BY MOUTH DAILY. 90 capsule 1  . simvastatin (ZOCOR) 20 MG tablet Take 1 tablet (20 mg total) by mouth daily. 90 tablet 0   No current facility-administered  medications for this visit.    Allergies:  Patient has no known allergies.   Social History: The patient  reports that he quit smoking about 7 months ago. His smoking use included Cigars. He has a 47.00 pack-year smoking history. He has never used smokeless tobacco. He reports that he drinks about 7.2 oz of alcohol per week . He reports that he does not use drugs.   Family History: The patient's family history includes Colon cancer in his brother.   ROS:  Please see the history of present illness. Otherwise, complete review of systems is positive for  {NONE DEFAULTED:18576::"none"}.  All other systems are reviewed and negative.   Physical Exam: VS:  There were no vitals taken for this visit., BMI There is no height or weight on file to calculate BMI.  Wt Readings from Last 3 Encounters:  08/20/16 225 lb 3.2 oz (102.2 kg)  07/23/16 222 lb 10.6 oz (101 kg)  06/30/16 226 lb 12.8 oz (102.9 kg)    General: Patient appears comfortable at rest. HEENT: Conjunctiva and lids normal, oropharynx clear. Neck: Supple, no elevated JVP or carotid bruits, no thyromegaly. Lungs: Clear to auscultation, nonlabored breathing at rest. Cardiac: Irregularly irregular, no S3 or significant systolic murmur, no pericardial rub. Abdomen: Soft, nontender, bowel sounds present, no guarding or rebound. Extremities: No pitting edema, distal pulses 2+. Skin: Warm and dry. Musculoskeletal: No kyphosis. Neuropsychiatric: Alert and oriented x3, affect grossly appropriate.  ECG: I personally reviewed the tracing from 08/20/2016 which showed normal sinus rhythm with nonspecific T-wave changes, normal QTc.  Recent Labwork: 02/13/2016: ALT 19; AST 18; TSH 1.540 07/16/2016: BUN 19; Creatinine, Ser 0.74; Hemoglobin 14.9; Platelets 264; Potassium 4.7; Sodium 142     Component Value Date/Time   CHOL 151 02/13/2016 0918   TRIG 103 02/13/2016 0918   HDL 41 02/13/2016 0918   CHOLHDL 3.7 02/13/2016 0918   CHOLHDL 3.1 11/18/2014 0846   VLDL 14 11/18/2014 0846   LDLCALC 89 02/13/2016 0918    Other Studies Reviewed Today:  Echocardiogram 03/04/2016: Study Conclusions  - Left ventricle: The cavity size was normal. Wall thickness was   increased in a pattern of mild LVH. The estimated ejection   fraction was 55%. Wall motion was normal; there were no regional   wall motion abnormalities. The study is not technically   sufficient to allow evaluation of LV diastolic function. - Aortic valve: Trileaflet; moderately calcified leaflets.   Noncoronary cusp mobility was  severely restricted. There was mild   stenosis. Aortic valve area approximately 1.5-1.7 cm^2 by   planimetry. - Aortic root: The aortic root was mildly ectatic. - Mitral valve: Calcified annulus. There was trivial regurgitation. - Left atrium: The atrium was mildly dilated. - Right atrium: The atrium was mildly to moderately dilated. - Atrial septum: No defect or patent foramen ovale was identified. - Tricuspid valve: There was trivial regurgitation. - Pulmonary arteries: Systolic pressure could not be accurately   estimated. - Pericardium, extracardiac: There was no pericardial effusion.  Impressions:  - Mild LVH with LVEF approximately 55%. Indeterminate diastolic   function in the setting of atrial fibrillation. Mild left atrial   enlargement. Mildly calcified mitral annulus with trivial mitral   regurgitation. Mild calcific aortic stenosis as outlined above.   Mildly ectatic aortic root. Trivial tricuspid regurgitation.  Assessment and Plan:   Current medicines were reviewed with the patient today.  No orders of the defined types were placed in this encounter.   Disposition:  Signed,  Satira Sark, MD, Physician [facility] 09/08/2016 9:44 AM    [facility] at [facility] [address], [place], [place] [address] Phone: [phone]; Fax: [phone]

## 2016-09-09 ENCOUNTER — Ambulatory Visit: Payer: Medicare Other | Admitting: Cardiology

## 2016-09-16 DIAGNOSIS — M62512 Muscle wasting and atrophy, not elsewhere classified, left shoulder: Secondary | ICD-10-CM | POA: Diagnosis not present

## 2016-09-16 DIAGNOSIS — Z4789 Encounter for other orthopedic aftercare: Secondary | ICD-10-CM | POA: Diagnosis not present

## 2016-09-16 DIAGNOSIS — M25512 Pain in left shoulder: Secondary | ICD-10-CM | POA: Diagnosis not present

## 2016-09-16 DIAGNOSIS — M25612 Stiffness of left shoulder, not elsewhere classified: Secondary | ICD-10-CM | POA: Diagnosis not present

## 2016-09-17 DIAGNOSIS — L570 Actinic keratosis: Secondary | ICD-10-CM | POA: Diagnosis not present

## 2016-09-17 DIAGNOSIS — Z85828 Personal history of other malignant neoplasm of skin: Secondary | ICD-10-CM | POA: Diagnosis not present

## 2016-09-18 DIAGNOSIS — Z4789 Encounter for other orthopedic aftercare: Secondary | ICD-10-CM | POA: Diagnosis not present

## 2016-09-18 DIAGNOSIS — M25512 Pain in left shoulder: Secondary | ICD-10-CM | POA: Diagnosis not present

## 2016-09-18 DIAGNOSIS — M25612 Stiffness of left shoulder, not elsewhere classified: Secondary | ICD-10-CM | POA: Diagnosis not present

## 2016-09-18 DIAGNOSIS — M62512 Muscle wasting and atrophy, not elsewhere classified, left shoulder: Secondary | ICD-10-CM | POA: Diagnosis not present

## 2016-09-24 DIAGNOSIS — M25612 Stiffness of left shoulder, not elsewhere classified: Secondary | ICD-10-CM | POA: Diagnosis not present

## 2016-09-24 DIAGNOSIS — M25512 Pain in left shoulder: Secondary | ICD-10-CM | POA: Diagnosis not present

## 2016-09-24 DIAGNOSIS — Z4789 Encounter for other orthopedic aftercare: Secondary | ICD-10-CM | POA: Diagnosis not present

## 2016-09-24 DIAGNOSIS — M62512 Muscle wasting and atrophy, not elsewhere classified, left shoulder: Secondary | ICD-10-CM | POA: Diagnosis not present

## 2016-10-13 ENCOUNTER — Encounter: Payer: Self-pay | Admitting: *Deleted

## 2016-10-27 ENCOUNTER — Telehealth: Payer: Self-pay | Admitting: Family Medicine

## 2016-10-27 ENCOUNTER — Other Ambulatory Visit: Payer: Self-pay | Admitting: Nurse Practitioner

## 2016-10-27 DIAGNOSIS — E785 Hyperlipidemia, unspecified: Secondary | ICD-10-CM

## 2016-10-27 DIAGNOSIS — Z79899 Other long term (current) drug therapy: Secondary | ICD-10-CM

## 2016-10-27 DIAGNOSIS — Z125 Encounter for screening for malignant neoplasm of prostate: Secondary | ICD-10-CM

## 2016-10-27 NOTE — Telephone Encounter (Signed)
Patient notified

## 2016-10-27 NOTE — Telephone Encounter (Signed)
Labs ordered.

## 2016-10-27 NOTE — Telephone Encounter (Signed)
Pt is needing lab orders sent over for an appt with Dr Richardson Landry on the 13th of August.

## 2016-10-28 ENCOUNTER — Ambulatory Visit (INDEPENDENT_AMBULATORY_CARE_PROVIDER_SITE_OTHER): Payer: Medicare Other | Admitting: Cardiology

## 2016-10-28 ENCOUNTER — Encounter: Payer: Self-pay | Admitting: Cardiology

## 2016-10-28 VITALS — BP 144/90 | HR 78 | Ht 70.0 in | Wt 229.0 lb

## 2016-10-28 DIAGNOSIS — I481 Persistent atrial fibrillation: Secondary | ICD-10-CM | POA: Diagnosis not present

## 2016-10-28 DIAGNOSIS — I10 Essential (primary) hypertension: Secondary | ICD-10-CM | POA: Diagnosis not present

## 2016-10-28 DIAGNOSIS — E782 Mixed hyperlipidemia: Secondary | ICD-10-CM | POA: Diagnosis not present

## 2016-10-28 DIAGNOSIS — I4819 Other persistent atrial fibrillation: Secondary | ICD-10-CM

## 2016-10-28 NOTE — Patient Instructions (Signed)
Medication Instructions:    Your physician recommends that you continue on your current medications as directed. Please refer to the Current Medication list given to you today.  - If you need a refill on your cardiac medications before your next appointment, please call your pharmacy.   Labwork:  None ordered  Testing/Procedures:  None ordered  Follow-Up:  Your physician recommends that you schedule a follow-up appointment in: 3 months with Dr. Camnitz.  Thank you for choosing [facility]!!   Louie Meaders, RN [phone]  Any Other Special Instructions Will Be Listed Below (If Applicable).       

## 2016-10-28 NOTE — Progress Notes (Signed)
Electrophysiology Office Note   Date:  10/28/2016   ID:  [name], DOB 1951/02/08, MRN [id number]  PCP:  Jon Kirschner, MD  Cardiologist:  [name] Primary Electrophysiologist:  Jon Meredith Leeds, MD    Chief Complaint  Patient presents with  . Follow-up    Persistent Afib     History of Present Illness: Jon Jackson is a 66 y.o. male who presents today for electrophysiology evaluation.   Jon Jackson is a 66 y.o. male who is being seen today for the evaluation of atrial fibrillation at the request of Luking, [name], MD. He has a history of persistent atrial fibrillation. Had AF ablation on 07/22/16.  Today, denies symptoms of palpitations, chest pain, shortness of breath, orthopnea, PND, lower extremity edema, claudication, dizziness, presyncope, syncope, bleeding, or neurologic sequela. The patient is tolerating medications without difficulties and is otherwise without complaint today.    Past Medical History:  Diagnosis Date  . Anxiety   . Arthritis   . Atrial fibrillation ([place])   . Basal cell carcinoma   . Depression   . GERD (gastroesophageal reflux disease)   . Hyperlipidemia   . Squamous carcinoma    Past Surgical History:  Procedure Laterality Date  . ATRIAL FIBRILLATION ABLATION  07/22/2016  . ATRIAL FIBRILLATION ABLATION N/A 07/22/2016   Procedure: Atrial Fibrillation Ablation;  Surgeon: Jon Meredith Leeds, MD;  Location: [facility];  Service: Cardiovascular;  Laterality: N/A;  . BASAL CELL CARCINOMA EXCISION     forehead; top of scalp  . CARDIOVERSION N/A 04/07/2016   Procedure: CARDIOVERSION;  Surgeon: Jon Sark, MD;  [facility]: [facility];  Service: Cardiovascular;  Laterality: N/A;  . COLECTOMY  1990s   removal of section due to strangulated bowel.  . COLONOSCOPY N/A 05/30/2013   Procedure: COLONOSCOPY;  Surgeon: Jon Dolin, MD;  [facility];  Service: Endoscopy;  Laterality: N/A;  8:45 AM  . COLONOSCOPY      . INGUINAL HERNIA REPAIR Right 1990s  . SHOULDER ARTHROSCOPY WITH ROTATOR CUFF REPAIR AND SUBACROMIAL DECOMPRESSION Left 05/15/2016   Procedure: LEFT SHOULDER ARTHROSCOPY WITH ROTATOR CUFF REPAIR AND SUBACROMIAL DECOMPRESSION AND DISTAL CLAVICLE RESECTION;  Surgeon: Jon Britain, MD;  Location: [facility];  Service: Orthopedics;  Laterality: Left;  requests 2hrs  . SQUAMOUS CELL CARCINOMA EXCISION     scalp  . TONSILLECTOMY       Current Outpatient Prescriptions  Medication Sig Dispense Refill  . acetaminophen (TYLENOL) 500 MG tablet Take 1,000-1,500 mg by mouth 3 (three) times daily as needed for moderate pain or headache.    Jon Jackson apixaban (ELIQUIS) 5 MG TABS tablet Take 1 tablet (5 mg total) by mouth 2 (two) times daily. 60 tablet 6  . citalopram (CELEXA) 20 MG tablet TAKE ONE TABLET BY MOUTH ONCE DAILY 90 tablet 1  . fluticasone (FLONASE) 50 MCG/ACT nasal spray Place 1 spray into both nostrils daily as needed for allergies or rhinitis.    Jon Jackson ibuprofen (ADVIL,MOTRIN) 200 MG tablet Take 400-600 mg by mouth 3 (three) times daily as needed for headache or moderate pain.    . Melatonin 10 MG TABS Take 10 mg by mouth at bedtime as needed (sleep).    . Misc Natural Products (OSTEO BI-FLEX JOINT SHIELD PO) Take 2 tablets by mouth daily.     . Multiple Vitamin (MULTIVITAMIN) tablet Take 1 tablet by mouth daily.    Jon Jackson (ALLERGY EYE OP) Place 1-2 drops into both  eyes 3 (three) times daily as needed (for seasonal allergy/itchy eyes).    Jon Jackson omeprazole (PRILOSEC) 40 MG capsule TAKE 1 CAPSULE (40 MG TOTAL) BY MOUTH DAILY. 90 capsule 1  . simvastatin (ZOCOR) 20 MG tablet Take 1 tablet (20 mg total) by mouth daily. 90 tablet 0   No current facility-administered medications for this visit.     Allergies:   Patient has no known allergies.   Social History:  The patient  reports that he quit smoking about 8 months ago. His smoking use included Cigars. He has a 47.00 pack-year smoking history.  He has never used smokeless tobacco. He reports that he drinks about 7.2 oz of alcohol per week . He reports that he does not use drugs.   Family History:  The patient's family history includes Colon cancer in his brother.    ROS:  Please see the history of present illness.   Otherwise, review of systems is positive for cough.   All other systems are reviewed and negative.   PHYSICAL EXAM: VS:  BP (!) 144/90   Pulse 78   Ht 5\' 10"  (1.778 m)   Wt 229 lb (103.9 kg)   BMI 32.86 kg/m  , BMI Body mass index is 32.86 kg/m. GEN: Well nourished, well developed, in no acute distress  HEENT: normal  Neck: no JVD, carotid bruits, or masses Cardiac: RRR; no murmurs, rubs, or gallops,no edema  Respiratory:  clear to auscultation bilaterally, normal work of breathing GI: soft, nontender, nondistended, + BS MS: no deformity or atrophy  Skin: warm and dry Neuro:  Strength and sensation are intact Psych: euthymic mood, full affect  EKG:  EKG is ordered today. Personal review of the ekg ordered shows sinus rhythm, rate 78, nonspecific ST changes   Recent Labs: 02/13/2016: ALT 19; TSH 1.540 07/16/2016: BUN 19; Creatinine, Ser 0.74; Hemoglobin 14.9; Platelets 264; Potassium 4.7; Sodium 142    Lipid Panel     Component Value Date/Time   CHOL 151 02/13/2016 0918   TRIG 103 02/13/2016 0918   HDL 41 02/13/2016 0918   CHOLHDL 3.7 02/13/2016 0918   CHOLHDL 3.1 11/18/2014 0846   VLDL 14 11/18/2014 0846   LDLCALC 89 02/13/2016 0918     Wt Readings from Last 3 Encounters:  10/28/16 229 lb (103.9 kg)  08/20/16 225 lb 3.2 oz (102.2 kg)  07/23/16 222 lb 10.6 oz (101 kg)      Other studies Reviewed: Additional studies/ records that were reviewed today include: TTE 03/04/16  Review of the above records today demonstrates:  - Left ventricle: The cavity size was normal. Wall thickness was   increased in a pattern of mild LVH. The estimated ejection   fraction was 55%. Wall motion was normal;  there were no regional   wall motion abnormalities. The study is not technically   sufficient to allow evaluation of LV diastolic function. - Aortic valve: Trileaflet; moderately calcified leaflets.   Noncoronary cusp mobility was severely restricted. There was mild   stenosis. Aortic valve area approximately 1.5-1.7 cm^2 by   planimetry. - Aortic root: The aortic root was mildly ectatic. - Mitral valve: Calcified annulus. There was trivial regurgitation. - Left atrium: The atrium was mildly dilated. - Right atrium: The atrium was mildly to moderately dilated. - Atrial septum: No defect or patent foramen ovale was identified. - Tricuspid valve: There was trivial regurgitation. - Pulmonary arteries: Systolic pressure could not be accurately   estimated. - Pericardium, extracardiac: There was no  pericardial effusion.   ASSESSMENT AND PLAN:  1.  Persistent atrial fibrillation: On Eliquis. AF ablation 07/22/16. Remains in sinus rhythm and feeling well today. No changes at this time.  This patients CHA2DS2-VASc Score and unadjusted Ischemic Stroke Rate (% per year) is equal to 2.2 % stroke rate/year from a score of 2  Above score calculated as 1 point each if present [CHF, HTN, DM, Vascular=MI/PAD/Aortic Plaque, Age if 65-74, or Male] Above score calculated as 2 points each if present [Age > 75, or Stroke/TIA/TE]     2. Essential hypertension: Blood pressure mildly elevated today. Patient feels that it is due to him being in the office. No changes at this time.  3. Hyperlipidemia: Continue simvastatin      Current medicines are reviewed at length with the patient today.   The patient does not have concerns regarding his medicines.  The following changes were made today:  none  Labs/ tests ordered today include:  No orders of the defined types were placed in this encounter.    Disposition:   FU with Jon Camnitz 3 months  Signed, Jon Meredith Leeds, MD  10/28/2016 3:17 PM      [place] [address] [phone] (office) [phone] (fax)

## 2016-10-30 NOTE — Addendum Note (Signed)
Addended by: Stanton Kidney on: 10/30/2016 11:44 AM   Modules accepted: Orders

## 2016-11-05 ENCOUNTER — Other Ambulatory Visit: Payer: Self-pay | Admitting: Nurse Practitioner

## 2016-11-05 DIAGNOSIS — E785 Hyperlipidemia, unspecified: Secondary | ICD-10-CM | POA: Diagnosis not present

## 2016-11-05 DIAGNOSIS — Z125 Encounter for screening for malignant neoplasm of prostate: Secondary | ICD-10-CM | POA: Diagnosis not present

## 2016-11-05 DIAGNOSIS — Z79899 Other long term (current) drug therapy: Secondary | ICD-10-CM | POA: Diagnosis not present

## 2016-11-06 LAB — HEPATIC FUNCTION PANEL
ALT: 29 IU/L (ref 0–44)
AST: 24 IU/L (ref 0–40)
Albumin: 4.7 g/dL (ref 3.6–4.8)
Alkaline Phosphatase: 103 IU/L (ref 39–117)
BILIRUBIN TOTAL: 0.4 mg/dL (ref 0.0–1.2)
BILIRUBIN, DIRECT: 0.12 mg/dL (ref 0.00–0.40)
Total Protein: 7 g/dL (ref 6.0–8.5)

## 2016-11-06 LAB — LIPID PANEL
Chol/HDL Ratio: 3 ratio (ref 0.0–5.0)
Cholesterol, Total: 154 mg/dL (ref 100–199)
HDL: 51 mg/dL (ref >39)
LDL Calculated: 91 mg/dL (ref 0–99)
TRIGLYCERIDES: 59 mg/dL (ref 0–149)
VLDL Cholesterol Cal: 12 mg/dL (ref 5–40)

## 2016-11-06 LAB — PSA: Prostate Specific Ag, Serum: 0.5 ng/mL (ref 0.0–4.0)

## 2016-11-10 ENCOUNTER — Encounter: Payer: Self-pay | Admitting: Family Medicine

## 2016-11-10 ENCOUNTER — Ambulatory Visit (INDEPENDENT_AMBULATORY_CARE_PROVIDER_SITE_OTHER): Payer: Medicare Other | Admitting: Family Medicine

## 2016-11-10 VITALS — BP 130/90 | Ht 70.5 in | Wt 229.4 lb

## 2016-11-10 DIAGNOSIS — F320 Major depressive disorder, single episode, mild: Secondary | ICD-10-CM | POA: Diagnosis not present

## 2016-11-10 DIAGNOSIS — I481 Persistent atrial fibrillation: Secondary | ICD-10-CM | POA: Diagnosis not present

## 2016-11-10 DIAGNOSIS — E785 Hyperlipidemia, unspecified: Secondary | ICD-10-CM

## 2016-11-10 DIAGNOSIS — I4819 Other persistent atrial fibrillation: Secondary | ICD-10-CM

## 2016-11-10 MED ORDER — OMEPRAZOLE 40 MG PO CPDR: DELAYED_RELEASE_CAPSULE | ORAL | 3 refills | Status: DC

## 2016-11-10 MED ORDER — SIMVASTATIN 20 MG PO TABS: 20.0000 mg | ORAL_TABLET | Freq: Every day | ORAL | 1 refills | Status: DC

## 2016-11-10 MED ORDER — CITALOPRAM HYDROBROMIDE 20 MG PO TABS: 20.0000 mg | ORAL_TABLET | Freq: Every day | ORAL | 1 refills | Status: DC

## 2016-11-10 NOTE — Progress Notes (Signed)
   Subjective:    Patient ID: Jon Jackson, male    DOB: [birth year], 66 y.o.   MRN: [id number]  Hyperlipidemia  This is a chronic problem. The current episode started more than 1 year ago.   Patient states no other concerns this visit.   Patient continues to take lipid medication regularly. No obvious side effects from it. Generally does not miss a dose. Prior blood work results are reviewed with patient. Patient continues to work on fat intake in diet  Patient notes ongoing compliance with antidepressant medication. No obvious side effects. Reports does not miss a dose. Overall continues to help depression substantially. No thoughts of homicide or suicide. Would like to maintain medication.  Results for orders placed or performed in visit on 11/05/16  Lipid panel  Result Value Ref Range   Cholesterol, Total 154 100 - 199 mg/dL   Triglycerides 59 0 - 149 mg/dL   HDL 51 >39 mg/dL   VLDL Cholesterol Cal 12 5 - 40 mg/dL   LDL Calculated 91 0 - 99 mg/dL   Chol/HDL Ratio 3.0 0.0 - 5.0 ratio  Hepatic function panel  Result Value Ref Range   Total Protein 7.0 6.0 - 8.5 g/dL   Albumin 4.7 3.6 - 4.8 g/dL   Bilirubin Total 0.4 0.0 - 1.2 mg/dL   Bilirubin, Direct 0.12 0.00 - 0.40 mg/dL   Alkaline Phosphatase 103 39 - 117 IU/L   AST 24 0 - 40 IU/L   ALT 29 0 - 44 IU/L  PSA  Result Value Ref Range   Prostate Specific Ag, Serum 0.5 0.0 - 4.0 ng/mL   Long discussion regarding atrial fibrillation. Had attempts at cardioversion 3 all unsuccessful. Then press on with ablation. This was successful. Cardiologist Recommends maintaining oral anticoagulants for now discussed Review of Systems No headache, no major weight loss or weight gain, no chest pain no back pain abdominal pain no change in bowel habits complete ROS otherwise negative     Objective:   Physical Exam  Alert and oriented, vitals reviewed and stable, NAD ENT-TM's and ext canals WNL bilat via otoscopic exam Soft palate,  tonsils and post pharynx WNL via oropharyngeal exam Neck-symmetric, no masses; thyroid nonpalpable and nontender Pulmonary-no tachypnea or accessory muscle use; Clear without wheezes via auscultation Card--no abnrml murmurs, rhythm reg and rate WNL Carotid pulses symmetric, without bruits Impression 1      Assessment & Plan:  Impression 1 hyperlipidemia good control discussed compliance discussed maintain same meds #2 depression clinically stable maintain same meds diet exercise discussed #3 atrial fibrillation long discussion held. Maintain anticoagulant  Greater than 50% of this 25 minute face to face visit was spent in counseling and discussion and coordination of care regarding the above diagnosis/diagnosies

## 2017-01-29 ENCOUNTER — Encounter: Payer: Self-pay | Admitting: Cardiology

## 2017-01-29 ENCOUNTER — Ambulatory Visit (INDEPENDENT_AMBULATORY_CARE_PROVIDER_SITE_OTHER): Payer: Medicare Other | Admitting: Cardiology

## 2017-01-29 VITALS — BP 140/80 | HR 65 | Ht 70.0 in | Wt 227.0 lb

## 2017-01-29 DIAGNOSIS — I10 Essential (primary) hypertension: Secondary | ICD-10-CM

## 2017-01-29 DIAGNOSIS — I481 Persistent atrial fibrillation: Secondary | ICD-10-CM | POA: Diagnosis not present

## 2017-01-29 DIAGNOSIS — I4819 Other persistent atrial fibrillation: Secondary | ICD-10-CM

## 2017-01-29 DIAGNOSIS — E785 Hyperlipidemia, unspecified: Secondary | ICD-10-CM

## 2017-01-29 NOTE — Progress Notes (Signed)
Electrophysiology Office Note   Date:  01/29/2017   ID:  [name], DOB Nov 11, 1950, MRN [id number]  PCP:  Mikey Kirschner, MD  Cardiologist:  [name] Primary Electrophysiologist:  Domenic Schoenberger Meredith Leeds, MD    Chief Complaint  Patient presents with  . Follow-up    Persistent Afib     History of Present Illness: Jon Jackson is a 66 y.o. male who presents today for electrophysiology evaluation.   Jon Jackson is a 66 y.o. male who is being seen today for the evaluation of atrial fibrillation at the request of Luking, [name], MD. He has a history of persistent atrial fibrillation. Had AF ablation on 07/22/16.  Today, denies symptoms of palpitations, chest pain, shortness of breath, orthopnea, PND, lower extremity edema, claudication, dizziness, presyncope, syncope, bleeding, or neurologic sequela. The patient is tolerating medications without difficulties.  He has had no further episodes of atrial fibrillation.  He is feeling well without major complaint.   Past Medical History:  Diagnosis Date  . Anxiety   . Arthritis   . Atrial fibrillation ([place])   . Basal cell carcinoma   . Depression   . GERD (gastroesophageal reflux disease)   . Hyperlipidemia   . Squamous carcinoma    Past Surgical History:  Procedure Laterality Date  . ATRIAL FIBRILLATION ABLATION  07/22/2016  . ATRIAL FIBRILLATION ABLATION N/A 07/22/2016   Procedure: Atrial Fibrillation Ablation;  Surgeon: Dondi Burandt Meredith Leeds, MD;  Location: [facility];  Service: Cardiovascular;  Laterality: N/A;  . BASAL CELL CARCINOMA EXCISION     forehead; top of scalp  . CARDIOVERSION N/A 04/07/2016   Procedure: CARDIOVERSION;  Surgeon: Satira Sark, MD;  Location: [facility];  Service: Cardiovascular;  Laterality: N/A;  . COLECTOMY  1990s   removal of section due to strangulated bowel.  . COLONOSCOPY N/A 05/30/2013   Procedure: COLONOSCOPY;  Surgeon: [name], MD;  [facility];   Service: Endoscopy;  Laterality: N/A;  8:45 AM  . COLONOSCOPY    . INGUINAL HERNIA REPAIR Right 1990s  . SHOULDER ARTHROSCOPY WITH ROTATOR CUFF REPAIR AND SUBACROMIAL DECOMPRESSION Left 05/15/2016   Procedure: LEFT SHOULDER ARTHROSCOPY WITH ROTATOR CUFF REPAIR AND SUBACROMIAL DECOMPRESSION AND DISTAL CLAVICLE RESECTION;  Surgeon: [name], MD;  Location: [facility];  Service: Orthopedics;  Laterality: Left;  requests 2hrs  . SQUAMOUS CELL CARCINOMA EXCISION     scalp  . TONSILLECTOMY       Current Outpatient Prescriptions  Medication Sig Dispense Refill  . acetaminophen (TYLENOL) 500 MG tablet Take 1,000-1,500 mg by mouth 3 (three) times daily as needed for moderate pain or headache.    Marland Kitchen apixaban (ELIQUIS) 5 MG TABS tablet Take 1 tablet (5 mg total) by mouth 2 (two) times daily. 60 tablet 6  . citalopram (CELEXA) 20 MG tablet Take 1 tablet (20 mg total) by mouth daily. 90 tablet 1  . fluticasone (FLONASE) 50 MCG/ACT nasal spray Place 1 spray into both nostrils daily as needed for allergies or rhinitis.    Marland Kitchen ibuprofen (ADVIL,MOTRIN) 200 MG tablet Take 400-600 mg by mouth 3 (three) times daily as needed for headache or moderate pain.    . Melatonin 10 MG TABS Take 10 mg by mouth at bedtime as needed (sleep).    . Misc Natural Products (OSTEO BI-FLEX JOINT SHIELD PO) Take 2 tablets by mouth daily.     . Multiple Vitamin (MULTIVITAMIN) tablet Take 1 tablet by mouth daily.    Marland Kitchen  Naphazoline-Pheniramine (ALLERGY EYE OP) Place 1-2 drops into both eyes 3 (three) times daily as needed (for seasonal allergy/itchy eyes).    Marland Kitchen omeprazole (PRILOSEC) 40 MG capsule TAKE 1 CAPSULE (40 MG TOTAL) BY MOUTH DAILY. 90 capsule 3  . simvastatin (ZOCOR) 20 MG tablet Take 1 tablet (20 mg total) by mouth daily. 90 tablet 1   No current facility-administered medications for this visit.     Allergies:   Patient has no known allergies.   Social History:  The patient  reports that he quit smoking about a year ago. His  smoking use included Cigars. He has a 47.00 pack-year smoking history. He has never used smokeless tobacco. He reports that he drinks about 7.2 oz of alcohol per week . He reports that he does not use drugs.   Family History:  The patient's family history includes Colon cancer in his brother.    ROS:  Please see the history of present illness.   Otherwise, review of systems is positive for easy bruising.   All other systems are reviewed and negative.   PHYSICAL EXAM: VS:  BP 140/80   Pulse 65   Ht 5\' 10"  (1.778 m)   Wt 227 lb (103 kg)   SpO2 96%   BMI 32.57 kg/m  , BMI Body mass index is 32.57 kg/m. GEN: Well nourished, well developed, in no acute distress  HEENT: normal  Neck: no JVD, carotid bruits, or masses Cardiac: RRR; no murmurs, rubs, or gallops,no edema  Respiratory:  clear to auscultation bilaterally, normal work of breathing GI: soft, nontender, nondistended, + BS MS: no deformity or atrophy  Skin: warm and dry Neuro:  Strength and sensation are intact Psych: euthymic mood, full affect  EKG:  EKG is ordered today. Personal review of the ekg ordered 10/28/16 shows sinus rhythm, rate 78   Recent Labs: 02/13/2016: TSH 1.540 07/16/2016: BUN 19; Creatinine, Ser 0.74; Hemoglobin 14.9; Platelets 264; Potassium 4.7; Sodium 142 11/05/2016: ALT 29    Lipid Panel     Component Value Date/Time   CHOL 154 11/05/2016 0946   TRIG 59 11/05/2016 0946   HDL 51 11/05/2016 0946   CHOLHDL 3.0 11/05/2016 0946   CHOLHDL 3.1 11/18/2014 0846   VLDL 14 11/18/2014 0846   LDLCALC 91 11/05/2016 0946     Wt Readings from Last 3 Encounters:  01/29/17 227 lb (103 kg)  11/10/16 229 lb 6 oz (104 kg)  10/28/16 229 lb (103.9 kg)      Other studies Reviewed: Additional studies/ records that were reviewed today include: TTE 03/04/16  Review of the above records today demonstrates:  - Left ventricle: The cavity size was normal. Wall thickness was   increased in a pattern of mild LVH. The  estimated ejection   fraction was 55%. Wall motion was normal; there were no regional   wall motion abnormalities. The study is not technically   sufficient to allow evaluation of LV diastolic function. - Aortic valve: Trileaflet; moderately calcified leaflets.   Noncoronary cusp mobility was severely restricted. There was mild   stenosis. Aortic valve area approximately 1.5-1.7 cm^2 by   planimetry. - Aortic root: The aortic root was mildly ectatic. - Mitral valve: Calcified annulus. There was trivial regurgitation. - Left atrium: The atrium was mildly dilated. - Right atrium: The atrium was mildly to moderately dilated. - Atrial septum: No defect or patent foramen ovale was identified. - Tricuspid valve: There was trivial regurgitation. - Pulmonary arteries: Systolic pressure could not be  accurately   estimated. - Pericardium, extracardiac: There was no pericardial effusion.   ASSESSMENT AND PLAN:  1.  Persistent atrial fibrillation: On Eliquis.  Status post A. fib ablation on 07/22/16.  Remains in sinus rhythm and is feeling well today.  No changes.  This patients CHA2DS2-VASc Score and unadjusted Ischemic Stroke Rate (% per year) is equal to 2.2 % stroke rate/year from a score of 2  Above score calculated as 1 point each if present [CHF, HTN, DM, Vascular=MI/PAD/Aortic Plaque, Age if 65-74, or Male] Above score calculated as 2 points each if present [Age > 75, or Stroke/TIA/TE]   2. Essential hypertension: Pressure is well controlled.  Jhonathan Desroches make no further changes.  3. Hyperlipidemia: Continue simvastatin     Current medicines are reviewed at length with the patient today.   The patient does not have concerns regarding his medicines.  The following changes were made today:  none  Labs/ tests ordered today include:  No orders of the defined types were placed in this encounter.    Disposition:   FU with Pennelope Basque 6 months  Signed, Sumeet Geter Meredith Leeds, MD    01/29/2017 3:49 PM     [facility] [address] [place] [address] [phone] (office) [phone] (fax)

## 2017-01-29 NOTE — Patient Instructions (Signed)
Medication Instructions:  Your physician recommends that you continue on your current medications as directed. Please refer to the Current Medication list given to you today.  Labwork: None ordered  Testing/Procedures: None ordered  Follow-Up: Your physician wants you to follow-up in: 6 months with Dr. Camnitz.  You will receive a reminder letter in the mail two months in advance. If you don't receive a letter, please call our office to schedule the follow-up appointment.  -- If you need a refill on your cardiac medications before your next appointment, please call your pharmacy. --  Thank you for choosing [facility]!!   Shanigua Gibb, RN [phone]  Any Other Special Instructions Will Be Listed Below (If Applicable).        

## 2017-02-23 ENCOUNTER — Encounter: Payer: Self-pay | Admitting: Nurse Practitioner

## 2017-02-23 ENCOUNTER — Ambulatory Visit (INDEPENDENT_AMBULATORY_CARE_PROVIDER_SITE_OTHER): Payer: Medicare Other | Admitting: Nurse Practitioner

## 2017-02-23 DIAGNOSIS — Z23 Encounter for immunization: Secondary | ICD-10-CM | POA: Diagnosis not present

## 2017-02-23 DIAGNOSIS — R197 Diarrhea, unspecified: Secondary | ICD-10-CM | POA: Insufficient documentation

## 2017-02-23 NOTE — Progress Notes (Signed)
Referring Provider: Mikey Kirschner, MD Primary Care Physician:  Mikey Kirschner, MD Primary GI:  Dr. [name]  Chief Complaint  Patient presents with  . Diarrhea    2-3 times at night x few months    HPI:   Jon Jackson is a 66 y.o. male who presents with complaints of diarrhea.  The patient has not been seen in our office since 2005.  Colonoscopy completed 05/30/2013 which found chronic diverticulosis, status post segmental colon resection, repeat colonoscopy in 5 years.  The patient has had approximately 3 hospital seizures this year.  The most recent of which was in April for atrial fibrillation ablation.  He is also had shoulder surgery and cardioversion.  He does have a first-degree relative with colon cancer.  Today he states he's doing well overall. He began havng diarrhea a couple months ago. He has been trying to lose some weight and has been doing pretty well overall, but this has resulted in some diet changes. Denies any new medications. He is having about 2 early morning loose stools and possible one more later in the day. Denies any abdominal pain. Has seen some toilet tissue hematochezia if he has 3 or more stools. He does have hemorrhoids. Denies melena. Denies fever, chills, unintentional weight loss. Energy has been pretty good. Denies chest pain, dyspnea, dizziness, lightheadedness, syncope, near syncope. Denies any other upper or lower GI symptoms.  Past Medical History:  Diagnosis Date  . Anxiety   . Arthritis   . Atrial fibrillation ([place])   . Basal cell carcinoma   . Depression   . GERD (gastroesophageal reflux disease)   . Hyperlipidemia   . Squamous carcinoma     Past Surgical History:  Procedure Laterality Date  . ATRIAL FIBRILLATION ABLATION  07/22/2016  . ATRIAL FIBRILLATION ABLATION N/A 07/22/2016   Procedure: Atrial Fibrillation Ablation;  Surgeon: Will Meredith Leeds, MD;  Location: [facility];  Service: Cardiovascular;  Laterality: N/A;    . BASAL CELL CARCINOMA EXCISION     forehead; top of scalp  . CARDIOVERSION N/A 04/07/2016   Procedure: CARDIOVERSION;  Surgeon: Satira Sark, MD;  Location: [facility];  Service: Cardiovascular;  Laterality: N/A;  . COLECTOMY  1990s   removal of section due to strangulated bowel.  . COLONOSCOPY N/A 05/30/2013   Procedure: COLONOSCOPY;  Surgeon: [name], MD;  [facility];  Service: Endoscopy;  Laterality: N/A;  8:45 AM  . COLONOSCOPY    . INGUINAL HERNIA REPAIR Right 1990s  . SHOULDER ARTHROSCOPY WITH ROTATOR CUFF REPAIR AND SUBACROMIAL DECOMPRESSION Left 05/15/2016   Procedure: LEFT SHOULDER ARTHROSCOPY WITH ROTATOR CUFF REPAIR AND SUBACROMIAL DECOMPRESSION AND DISTAL CLAVICLE RESECTION;  Surgeon: [name], MD;  Location: [facility];  Service: Orthopedics;  Laterality: Left;  requests 2hrs  . SQUAMOUS CELL CARCINOMA EXCISION     scalp  . TONSILLECTOMY      Current Outpatient Medications  Medication Sig Dispense Refill  . acetaminophen (TYLENOL) 500 MG tablet Take 1,000-1,500 mg by mouth 3 (three) times daily as needed for moderate pain or headache.    Marland Kitchen apixaban (ELIQUIS) 5 MG TABS tablet Take 1 tablet (5 mg total) by mouth 2 (two) times daily. 60 tablet 6  . citalopram (CELEXA) 20 MG tablet Take 1 tablet (20 mg total) by mouth daily. 90 tablet 1  . fluticasone (FLONASE) 50 MCG/ACT nasal spray Place 1 spray into both nostrils daily as needed for allergies or rhinitis.    Marland Kitchen  ibuprofen (ADVIL,MOTRIN) 200 MG tablet Take 400-600 mg by mouth 3 (three) times daily as needed for headache or moderate pain.    . Melatonin 10 MG TABS Take 10 mg by mouth at bedtime as needed (sleep).    . Misc Natural Products (OSTEO BI-FLEX JOINT SHIELD PO) Take 2 tablets by mouth daily.     . Multiple Vitamin (MULTIVITAMIN) tablet Take 1 tablet by mouth daily.    [name] Fill (ALLERGY EYE OP) Place 1-2 drops into both eyes 3 (three) times daily as needed (for seasonal allergy/itchy  eyes).    Marland Kitchen omeprazole (PRILOSEC) 40 MG capsule TAKE 1 CAPSULE (40 MG TOTAL) BY MOUTH DAILY. 90 capsule 3  . simvastatin (ZOCOR) 20 MG tablet Take 1 tablet (20 mg total) by mouth daily. 90 tablet 1   No current facility-administered medications for this visit.     Allergies as of 02/23/2017  . (No Known Allergies)    Family History  Problem Relation Age of Onset  . Colon cancer Brother 20    Social History   Socioeconomic History  . Marital status: Married    Spouse name: None  . Number of children: None  . Years of education: None  . Highest education level: None  Social Needs  . Financial resource strain: None  . Food insecurity - worry: None  . Food insecurity - inability: None  . Transportation needs - medical: None  . Transportation needs - non-medical: None  Occupational History  . None  Tobacco Use  . Smoking status: Former Smoker    Packs/day: 1.00    Years: 47.00    Pack years: 47.00    Types: Cigars    Last attempt to quit: 02/01/2016    Years since quitting: 1.0  . Smokeless tobacco: Never Used  Substance and Sexual Activity  . Alcohol use: Yes    Alcohol/week: 7.2 oz    Types: 12 Cans of beer per week    Comment: daily 3-4 in the summer; soemtimes mroe on weekends.  . Drug use: No  . Sexual activity: Yes  Other Topics Concern  . None  Social History Narrative  . None    Review of Systems: General: Negative for anorexia, weight loss, fever, chills, fatigue, weakness. ENT: Negative for hoarseness, difficulty swallowing , nasal congestion. CV: Negative for chest pain, angina, palpitations, peripheral edema.  Respiratory: Negative for dyspnea at rest, cough, sputum, wheezing.  GI: See history of present illness. MS: Shoulder pain improved s/p surgical repair.  Derm: Negative for rash or itching.  Endo: Negative for unusual weight change.  Heme: Negative for bruising or bleeding. Allergy: Negative for rash or hives.   Physical Exam: BP (!)  160/89   Pulse 71   Temp 98.2 F (36.8 C) (Oral)   Ht 5' 10.5" (1.791 m)   Wt 222 lb 9.6 oz (101 kg)   BMI 31.49 kg/m  General:   Alert and oriented. Pleasant and cooperative. Well-nourished and well-developed.  Head:  Normocephalic and atraumatic. Eyes:  Without icterus, sclera clear and conjunctiva pink.  Ears:  Normal auditory acuity. Cardiovascular:  S1, S2 present with 3/6 blowing systolic murmur appreciated. Extremities without clubbing or edema. Respiratory:  Clear to auscultation bilaterally. No wheezes, rales, or rhonchi. No distress.  Gastrointestinal:  +BS, soft, non-tender and non-distended. No HSM noted. No guarding or rebound. No masses appreciated.  Rectal:  Deferred  Musculoskalatal:  Symmetrical without gross deformities. Neurologic:  Alert and oriented x4;  grossly normal neurologically. Psych:  Alert and cooperative. Normal mood and affect. Heme/Lymph/Immune: No excessive bruising noted.    02/23/2017 8:35 AM   Disclaimer: This note was dictated with voice recognition software. Similar sounding words can inadvertently be transcribed and may not be corrected upon review.

## 2017-02-23 NOTE — Progress Notes (Signed)
CC'ED TO PCP 

## 2017-02-23 NOTE — Patient Instructions (Signed)
1. When you collect your stool samples bring them to the lab, not our office. 2. Have your blood work drawn when you drop off your stool samples. 3. We will call you with results. 4. If you have no infection we can recommend medication to help with your diarrhea. 5. Return for follow-up in 2 months. 6. Call if you have any questions or concerns.

## 2017-02-23 NOTE — Assessment & Plan Note (Signed)
The patient describes a few months of new onset diarrhea.  This represents a stool change for him.  He has been intermittently in the hospital this year for procedures.  He does have a family history of colon cancer in a primary relative and is about 1-1/2 years out from due date for routine surveillance/screening.  At this point given new onset diarrhea we will check stool studies for infection, pancreatic fecal elastase, as well as CBC, CMP, TSH.  If he has no infection we can try antidiarrheal for symptomatic management.  I will have him return in 2 months and if he is still having diarrhea we can consider early interval colonoscopy.

## 2017-02-24 ENCOUNTER — Other Ambulatory Visit: Payer: Self-pay | Admitting: Nurse Practitioner

## 2017-02-24 DIAGNOSIS — R197 Diarrhea, unspecified: Secondary | ICD-10-CM | POA: Diagnosis not present

## 2017-02-26 LAB — CBC/DIFF AMBIGUOUS DEFAULT
BASOS: 0 %
Basophils Absolute: 0 10*3/uL (ref 0.0–0.2)
EOS (ABSOLUTE): 0.2 10*3/uL (ref 0.0–0.4)
EOS: 4 %
HEMATOCRIT: 44.5 % (ref 37.5–51.0)
HEMOGLOBIN: 15 g/dL (ref 13.0–17.7)
IMMATURE GRANS (ABS): 0 10*3/uL (ref 0.0–0.1)
IMMATURE GRANULOCYTES: 0 %
LYMPHS: 12 %
Lymphocytes Absolute: 0.9 10*3/uL (ref 0.7–3.1)
MCH: 32.2 pg (ref 26.6–33.0)
MCHC: 33.7 g/dL (ref 31.5–35.7)
MCV: 96 fL (ref 79–97)
MONOCYTES: 6 %
Monocytes Absolute: 0.4 10*3/uL (ref 0.1–0.9)
NEUTROS ABS: 5.3 10*3/uL (ref 1.4–7.0)
NEUTROS PCT: 78 %
PLATELETS: 217 10*3/uL (ref 150–379)
RBC: 4.66 x10E6/uL (ref 4.14–5.80)
RDW: 12.8 % (ref 12.3–15.4)
WBC: 6.8 10*3/uL (ref 3.4–10.8)

## 2017-02-26 LAB — COMPREHENSIVE METABOLIC PANEL
ALBUMIN: 4.3 g/dL (ref 3.6–4.8)
ALT: 23 IU/L (ref 0–44)
AST: 16 IU/L (ref 0–40)
Albumin/Globulin Ratio: 2 (ref 1.2–2.2)
Alkaline Phosphatase: 96 IU/L (ref 39–117)
BUN / CREAT RATIO: 15 (ref 10–24)
BUN: 14 mg/dL (ref 8–27)
Bilirubin Total: 0.5 mg/dL (ref 0.0–1.2)
CALCIUM: 9.3 mg/dL (ref 8.6–10.2)
CO2: 22 mmol/L (ref 20–29)
Chloride: 107 mmol/L — ABNORMAL HIGH (ref 96–106)
Creatinine, Ser: 0.91 mg/dL (ref 0.76–1.27)
GFR, EST AFRICAN AMERICAN: 101 mL/min/{1.73_m2} (ref >59)
GFR, EST NON AFRICAN AMERICAN: 88 mL/min/{1.73_m2} (ref >59)
GLUCOSE: 96 mg/dL (ref 65–99)
Globulin, Total: 2.2 g/dL (ref 1.5–4.5)
Potassium: 4.3 mmol/L (ref 3.5–5.2)
Sodium: 144 mmol/L (ref 134–144)
TOTAL PROTEIN: 6.5 g/dL (ref 6.0–8.5)

## 2017-02-26 LAB — GI PROFILE, STOOL, PCR
ASTROVIRUS: NOT DETECTED
Adenovirus F 40/41: NOT DETECTED
C difficile toxin A/B: NOT DETECTED
CYCLOSPORA CAYETANENSIS: NOT DETECTED
Campylobacter: NOT DETECTED
Cryptosporidium: NOT DETECTED
ENTAMOEBA HISTOLYTICA: NOT DETECTED
ENTEROPATHOGENIC E COLI: NOT DETECTED
Enteroaggregative E coli: NOT DETECTED
Enterotoxigenic E coli: NOT DETECTED
Giardia lamblia: NOT DETECTED
Norovirus GI/GII: NOT DETECTED
PLESIOMONAS SHIGELLOIDES: NOT DETECTED
ROTAVIRUS A: NOT DETECTED
SHIGA-TOXIN-PRODUCING E COLI: NOT DETECTED
SHIGELLA/ENTEROINVASIVE E COLI: NOT DETECTED
Salmonella: NOT DETECTED
Sapovirus: NOT DETECTED
VIBRIO: NOT DETECTED
Vibrio cholerae: NOT DETECTED
Yersinia enterocolitica: NOT DETECTED

## 2017-02-26 LAB — SPECIMEN STATUS REPORT

## 2017-02-26 LAB — CLOSTRIDIUM DIFFICILE EIA: C difficile Toxins A+B, EIA: NEGATIVE

## 2017-02-26 LAB — TSH: TSH: 1.52 u[IU]/mL (ref 0.450–4.500)

## 2017-03-02 ENCOUNTER — Telehealth: Payer: Self-pay | Admitting: Internal Medicine

## 2017-03-02 ENCOUNTER — Other Ambulatory Visit: Payer: Self-pay | Admitting: Gastroenterology

## 2017-03-02 MED ORDER — DICYCLOMINE HCL 10 MG PO CAPS: 10.0000 mg | ORAL_CAPSULE | Freq: Three times a day (TID) | ORAL | 1 refills | Status: DC

## 2017-03-02 NOTE — Progress Notes (Signed)
I will send in Bentyl to take before meals and at bedtime. Monitor for constipation, dry mouth, dizziness. Further recommendations per Randall Hiss.

## 2017-03-02 NOTE — Telephone Encounter (Signed)
[phone] PLEASE CALL PATIENT, HE IS STILL HAVING DIARRHEA AND WOULD LIKE TO HAVE SOMETHING HE CAN TAKE TO STOP IT.

## 2017-03-02 NOTE — Telephone Encounter (Signed)
Spoke with pt and sent message to pts provider. See result notes.

## 2017-04-01 DIAGNOSIS — L570 Actinic keratosis: Secondary | ICD-10-CM | POA: Diagnosis not present

## 2017-04-01 DIAGNOSIS — D485 Neoplasm of uncertain behavior of skin: Secondary | ICD-10-CM | POA: Diagnosis not present

## 2017-04-01 DIAGNOSIS — Z85828 Personal history of other malignant neoplasm of skin: Secondary | ICD-10-CM | POA: Diagnosis not present

## 2017-04-07 ENCOUNTER — Other Ambulatory Visit: Payer: Self-pay | Admitting: Cardiology

## 2017-04-22 ENCOUNTER — Ambulatory Visit (INDEPENDENT_AMBULATORY_CARE_PROVIDER_SITE_OTHER): Payer: Medicare Other | Admitting: Nurse Practitioner

## 2017-04-22 ENCOUNTER — Encounter: Payer: Self-pay | Admitting: Nurse Practitioner

## 2017-04-22 VITALS — BP 154/88 | HR 67 | Temp 98.1°F | Ht 70.0 in | Wt 233.2 lb

## 2017-04-22 DIAGNOSIS — R197 Diarrhea, unspecified: Secondary | ICD-10-CM

## 2017-04-22 DIAGNOSIS — K219 Gastro-esophageal reflux disease without esophagitis: Secondary | ICD-10-CM | POA: Diagnosis not present

## 2017-04-22 NOTE — Assessment & Plan Note (Signed)
Persistent diarrhea, although slightly improved on Bentyl.  Stool studies were negative.  TSH negative.  CBC and CMP essentially negative.  At this point it is been 4 years since his last colonoscopy and he is due within about 1 year.  He does have a family history of colon cancer.  His diarrhea was abrupt and sudden onset approximately 2-3 months ago.  Given that he is ruled out for infection and he is having persistent problems despite antidiarrheal medications we will plan for early interval colonoscopy.  If this is normal we can consider CT of the abdomen and pelvis or other more obscure diagnoses such as possible lactose intolerance versus exocrine pancreatic insufficiency with a trial of empiric enzymes.  He is on Eliquis for A. fib and we will consult cardiology for the okay to hold this for 48 hours prior to his procedure.  Proceed with TCS with Dr. [name] in near future: the risks, benefits, and alternatives have been discussed with the patient in detail. The patient states understanding and desires to proceed.  The patient is on Eliquis as per above.  No other anticoagulants, anxiolytics, chronic pain medications.  He is on Celexa 20 mg daily.  Denies alcohol and drug use currently.  During the summer he does drink when it is hot outside, primarily beer 2-3 a day.  Conscious sedation should be adequate for his procedure as it was for his last.

## 2017-04-22 NOTE — Patient Instructions (Signed)
1. We will schedule your procedure for you. 2. If it is normal, we can consider other options for workup. 3. We will contact your cardiologist with permission to hold your blood thinner for 2 days prior to your procedure. 4. Return for follow-up in 3 months. 5. Call us if you have any questions or concerns.

## 2017-04-22 NOTE — Progress Notes (Signed)
cc'ed to pcp °

## 2017-04-22 NOTE — Assessment & Plan Note (Signed)
GERD symptoms generally well controlled.  Recommend continue current medications, follow-up in 3 months.

## 2017-04-22 NOTE — Progress Notes (Signed)
Referring Provider: Mikey Kirschner, MD Primary Care Physician:  Mikey Kirschner, MD Primary GI:  Dr. [name]  Chief Complaint  Patient presents with  . Diarrhea    diarrhea 2-3 times daily    HPI:   Jon Jackson is a 67 y.o. male who presents for follow-up on diarrhea.  The patient was last seen in our office 02/23/2017 also for diarrhea.  Last colonoscopy 2015 with chronic diverticulosis status post segmental colon resection and recommended repeat colonoscopy in 5 years (2020).  Patient had 3 hospitalizations in 2018.  First-degree relative with colon cancer.  At his last visit he was doing well overall, began diarrhea couple months ago and noted he has been trying to lose weight and has been to been doing pretty well over but is resulted in some diet changes.  No new medications.  Has about 2 early morning bowel movements which are loose and one more later in the day.  No abdominal pain.  Some toilet tissue hematochezia if he has 3 or more stools, does have a history of hemorrhoids.  No other GI symptoms.  Recommended stool studies, blood work including CBC, CMP, TSH.  Stools to check for infection and pancreatic fecal elastase.  Recommended antidiarrheal management if stool studies negative.  Follow-up in 2 months and if still having symptoms can consider early interval colonoscopy.  Labs completed 02/24/2017 which found normal CBC, negative for C. difficile, negative GI pathogen panel, normal TSH, essentially normal CMP.  Does not appear pancreatic fecal elastase is completed.  He was recommended to try Bentyl for symptomatic management.  Today he states he's doing ok overall. His diarrhea is somewhat improved on Bentyl but still not great, about 2-3 loose stools a day. His brother had colon CA at age 24. 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 Denies abdominal pain, N/V, hematochezia, melena, unintentional weight loss. GERD well controlled.  Does have bloating. Stools appear "oily" and float. No recent abdominal imaging on file. Denies chest pain, dyspnea, dizziness, lightheadedness, syncope, near syncope. Denies any other upper or lower GI symptoms.  Past Medical History:  Diagnosis Date  . Anxiety   . Arthritis   . Atrial fibrillation ([place])   . Basal cell carcinoma   . Depression   . GERD (gastroesophageal reflux disease)   . Hyperlipidemia   . Squamous carcinoma     Past Surgical History:  Procedure Laterality Date  . ATRIAL FIBRILLATION ABLATION  07/22/2016  . ATRIAL FIBRILLATION ABLATION N/A 07/22/2016   Procedure: Atrial Fibrillation Ablation;  Surgeon: Will Meredith Leeds, MD;  Location: [facility];  Service: Cardiovascular;  Laterality: N/A;  . BASAL CELL CARCINOMA EXCISION     forehead; top of scalp  . CARDIOVERSION N/A 04/07/2016   Procedure: CARDIOVERSION;  Surgeon: Satira Sark, MD;  Location: [facility];  Service: Cardiovascular;  Laterality: N/A;  . COLECTOMY  1990s   removal of section due to strangulated bowel.  . COLONOSCOPY N/A 05/30/2013   Procedure: COLONOSCOPY;  Surgeon: [name], MD;  Location: [facility];  Service: Endoscopy;  Laterality: N/A;  8:45 AM  . COLONOSCOPY    . INGUINAL HERNIA REPAIR Right 1990s  . SHOULDER ARTHROSCOPY WITH ROTATOR CUFF REPAIR AND SUBACROMIAL DECOMPRESSION Left 05/15/2016   Procedure: LEFT SHOULDER ARTHROSCOPY WITH ROTATOR CUFF REPAIR AND SUBACROMIAL DECOMPRESSION AND DISTAL CLAVICLE RESECTION;  Surgeon: [name], MD;  Location: [facility];  Service: Orthopedics;  Laterality: Left;  requests 2hrs  . SQUAMOUS CELL CARCINOMA EXCISION     scalp  . TONSILLECTOMY      Current Outpatient Medications  Medication Sig Dispense Refill  . acetaminophen (TYLENOL) 500 MG tablet Take 1,000-1,500 mg by mouth 3 (three) times daily as needed for moderate pain or headache.    . citalopram (CELEXA) 20 MG tablet Take 1 tablet (20 mg total) by mouth daily. 90 tablet 1  .  ELIQUIS 5 MG TABS tablet TAKE ONE TABLET BY MOUTH TWICE DAILY 60 tablet 6  . ibuprofen (ADVIL,MOTRIN) 200 MG tablet Take 400-600 mg by mouth 3 (three) times daily as needed for headache or moderate pain.    . Melatonin 10 MG TABS Take 10 mg by mouth at bedtime as needed (sleep).    . Misc Natural Products (OSTEO BI-FLEX JOINT SHIELD PO) Take 2 tablets by mouth daily.     . Multiple Vitamin (MULTIVITAMIN) tablet Take 1 tablet by mouth daily.    [name] Fill (ALLERGY EYE OP) Place 1-2 drops into both eyes 3 (three) times daily as needed (for seasonal allergy/itchy eyes).    Marland Kitchen omeprazole (PRILOSEC) 40 MG capsule TAKE 1 CAPSULE (40 MG TOTAL) BY MOUTH DAILY. 90 capsule 3  . simvastatin (ZOCOR) 20 MG tablet Take 1 tablet (20 mg total) by mouth daily. 90 tablet 1  . dicyclomine (BENTYL) 10 MG capsule Take 1 capsule (10 mg total) by mouth 4 (four) times daily -  before meals and at bedtime for 14 days. 120 capsule 1   No current facility-administered medications for this visit.     Allergies as of 04/22/2017  . (No Known Allergies)    Family History  Problem Relation Age of Onset  . Colon cancer Brother 21    Social History   Socioeconomic History  . Marital status: Married    Spouse name: None  . Number of children: None  . Years of education: None  . Highest education level: None  Social Needs  . Financial resource strain: None  . Food insecurity - worry: None  . Food insecurity - inability: None  . Transportation needs - medical: None  . Transportation needs - non-medical: None  Occupational History  . None  Tobacco Use  . Smoking status: Former Smoker    Packs/day: 1.00    Years: 47.00    Pack years: 47.00    Types: Cigars    Last attempt to quit: 02/01/2016    Years since quitting: 1.2  . Smokeless tobacco: Never Used  Substance and Sexual Activity  . Alcohol use: Yes    Alcohol/week: 7.2 oz    Types: 12 Cans of beer per week    Comment: daily 3-4 in the  summer; soemtimes more on weekends.  . Drug use: No  . Sexual activity: Yes  Other Topics Concern  . None  Social History Narrative  . None    Review of Systems: General: Negative for anorexia, weight loss, fever, chills, fatigue, weakness. ENT: Negative for hoarseness, difficulty swallowing. CV: Negative for chest pain, angina, palpitations, peripheral edema.  Respiratory: Negative for dyspnea at rest, cough, sputum, wheezing.  GI: See history of present illness. Endo: Negative for unusual weight change.  Heme: Negative for bruising or bleeding.   Physical Exam: BP (!) 154/88   Pulse 67   Temp 98.1 F (36.7 C) (Oral)   Ht 5\' 10"  (1.778 m)   Wt 233 lb 3.2 oz (105.8 kg)   BMI 33.46 kg/m  General:   Obese male. Alert and oriented. Pleasant and cooperative. Well-nourished and well-developed.  Eyes:  Without icterus, sclera clear and conjunctiva pink.  Ears:  Normal auditory acuity. Cardiovascular:  S1, S2 present without murmurs appreciated. Extremities without clubbing or edema. Respiratory:  Clear to auscultation bilaterally. No wheezes, rales, or rhonchi. No distress.  Gastrointestinal:  +BS, soft, non-tender and non-distended. No HSM noted. No guarding or rebound. No masses appreciated.  Rectal:  Deferred  Musculoskalatal:  Symmetrical without gross deformities. Neurologic:  Alert and oriented x4;  grossly normal neurologically. Psych:  Alert and cooperative. Normal mood and affect. Heme/Lymph/Immune: No excessive bruising noted.    04/22/2017 2:45 PM   Disclaimer: This note was dictated with voice recognition software. Similar sounding words can inadvertently be transcribed and may not be corrected upon review.

## 2017-04-23 ENCOUNTER — Telehealth: Payer: Self-pay

## 2017-04-23 ENCOUNTER — Other Ambulatory Visit: Payer: Self-pay

## 2017-04-23 DIAGNOSIS — R197 Diarrhea, unspecified: Secondary | ICD-10-CM

## 2017-04-23 MED ORDER — NA SULFATE-K SULFATE-MG SULF 17.5-3.13-1.6 GM/177ML PO SOLN: 1.0000 | ORAL | 0 refills | Status: DC

## 2017-04-23 NOTE — Telephone Encounter (Signed)
Dr. Curt Bears,   This mutual pt was seen by Walden Field, NP in our office yesterday.  Randall Hiss would like to schedule him for a colonoscopy with Dr. [name] in the very near future.  Please advise if it is Ok to hold Eliquis x 2 days prior to procedure.  Thanks so much!  Everardo All, LPN

## 2017-04-23 NOTE — Telephone Encounter (Signed)
Forwarding to Walden Field, NP and [facility] Clinical staff.

## 2017-04-23 NOTE — Telephone Encounter (Signed)
Called and informed pt. TCS w/RMR scheduled for 05/27/17 at 1:00pm. He is aware to hold Eliquis for 2 days prior to TCS (also noted on instructions). Rx for prep sent to pharmacy. Instructions mailed. Orders entered.

## 2017-04-23 NOTE — Telephone Encounter (Signed)
Noted, thanks!

## 2017-04-23 NOTE — Telephone Encounter (Signed)
Holding Eliquis for 2 days should not be an issue pre colonoscopy.

## 2017-05-11 ENCOUNTER — Other Ambulatory Visit: Payer: Self-pay | Admitting: Family Medicine

## 2017-05-12 NOTE — Telephone Encounter (Signed)
Ok times one 

## 2017-05-13 ENCOUNTER — Encounter: Payer: Medicare Other | Admitting: Family Medicine

## 2017-05-14 ENCOUNTER — Encounter: Payer: Self-pay | Admitting: Family Medicine

## 2017-05-14 ENCOUNTER — Ambulatory Visit (INDEPENDENT_AMBULATORY_CARE_PROVIDER_SITE_OTHER): Payer: Medicare Other | Admitting: Family Medicine

## 2017-05-14 VITALS — BP 144/90 | Temp 98.9°F | Ht 70.0 in | Wt 218.0 lb

## 2017-05-14 DIAGNOSIS — Z23 Encounter for immunization: Secondary | ICD-10-CM | POA: Diagnosis not present

## 2017-05-14 DIAGNOSIS — J329 Chronic sinusitis, unspecified: Secondary | ICD-10-CM | POA: Diagnosis not present

## 2017-05-14 DIAGNOSIS — Z0001 Encounter for general adult medical examination with abnormal findings: Secondary | ICD-10-CM | POA: Diagnosis not present

## 2017-05-14 DIAGNOSIS — J310 Chronic rhinitis: Secondary | ICD-10-CM

## 2017-05-14 DIAGNOSIS — E785 Hyperlipidemia, unspecified: Secondary | ICD-10-CM | POA: Diagnosis not present

## 2017-05-14 DIAGNOSIS — F320 Major depressive disorder, single episode, mild: Secondary | ICD-10-CM | POA: Diagnosis not present

## 2017-05-14 DIAGNOSIS — N400 Enlarged prostate without lower urinary tract symptoms: Secondary | ICD-10-CM

## 2017-05-14 DIAGNOSIS — Z0000 Encounter for general adult medical examination without abnormal findings: Secondary | ICD-10-CM

## 2017-05-14 MED ORDER — CITALOPRAM HYDROBROMIDE 20 MG PO TABS: 20.0000 mg | ORAL_TABLET | Freq: Every day | ORAL | 1 refills | Status: DC

## 2017-05-14 MED ORDER — AMOXICILLIN 500 MG PO CAPS: 500.0000 mg | ORAL_CAPSULE | Freq: Three times a day (TID) | ORAL | 0 refills | Status: DC

## 2017-05-14 MED ORDER — SIMVASTATIN 20 MG PO TABS: 20.0000 mg | ORAL_TABLET | Freq: Every day | ORAL | 1 refills | Status: DC

## 2017-05-14 NOTE — Progress Notes (Signed)
Subjective:    Patient ID: Jon Jackson, male    DOB: Aug 28, 1950, 67 y.o.   MRN: [id number]  HPI AWV- Annual Wellness Visit  The patient was seen for their annual wellness visit. The patient's past medical history, surgical history, and family history were reviewed. Pertinent vaccines were reviewed ( tetanus, pneumonia, shingles, flu) The patient's medication list was reviewed and updated.  The height and weight were entered.  BMI recorded in electronic record elsewhere  Cognitive screening was completed. Outcome of Mini - Cog: pass   Falls /depression screening electronically recorded within record elsewhere  Current tobacco usage: none (All patients who use tobacco were given written and verbal information on quitting)  Recent listing of emergency department/hospitalizations over the past year were reviewed.  current specialist the patient sees on a regular basis: none   Medicare annual wellness visit patient questionnaire was reviewed.  A written screening schedule for the patient for the next 5-10 years was given. Appropriate discussion of followup regarding next visit was discussed.  Cough, congestion, sinus pain and pressure.   Blood pressure medicine and blood pressure levels reviewed today with patient. Compliant with blood pressure medicine. States does not miss a dose. No obvious side effects. Blood pressure generally good when checked elsewhere. Watching salt intake.  Patient continues to take lipid medication regularly. No obvious side effects from it. Generally does not miss a dose. Prior blood work results are reviewed with patient. Patient continues to work on fat intake in diet  Chronic diarrhe, doing colonoscopy soon    Walking minmile ea morn   Patient notes ongoing compliance with antidepressant medication. No obvious side effects. Reports does not miss a dose. Overall continues to help depression substantially. No thoughts of homicide or suicide.  Would like to maintain medication.     [facility]  Review of Systems  Constitutional: Negative for activity change, appetite change and fever.  HENT: Negative for congestion and rhinorrhea.   Eyes: Negative for discharge.  Respiratory: Negative for cough and wheezing.   Cardiovascular: Negative for chest pain.  Gastrointestinal: Negative for abdominal pain, blood in stool and vomiting.  Genitourinary: Negative for difficulty urinating and frequency.  Musculoskeletal: Negative for neck pain.  Skin: Negative for rash.  Allergic/Immunologic: Negative for environmental allergies and food allergies.  Neurological: Negative for weakness and headaches.  Psychiatric/Behavioral: Negative for agitation.  All other systems reviewed and are negative.      Objective:   Physical Exam  Constitutional: He appears well-developed and well-nourished.  HENT:  Head: Normocephalic and atraumatic.  Right Ear: External ear normal.  Left Ear: External ear normal.  Nose: Nose normal.  Mouth/Throat: Oropharynx is clear and moist.  Eyes: Right eye exhibits no discharge. Left eye exhibits no discharge. No scleral icterus.  Neck: Normal range of motion. Neck supple. No thyromegaly present.  Cardiovascular: Normal rate, regular rhythm and normal heart sounds.  No murmur heard. Pulmonary/Chest: Effort normal and breath sounds normal. No respiratory distress. He has no wheezes.  Abdominal: Soft. Bowel sounds are normal. He exhibits no distension and no mass. There is no tenderness.  Genitourinary: Penis normal.  Musculoskeletal: Normal range of motion. He exhibits no edema.  Lymphadenopathy:    He has no cervical adenopathy.  Neurological: He is alert. He exhibits normal muscle tone. Coordination normal.  Skin: Skin is warm and dry. No erythema.  Psychiatric: He has a normal mood and affect. His behavior is normal. Judgment normal.  Assessment & Plan:  Impression #1 wellness exam.  Prevnar  today.  Shingles vaccine written.  Colonoscopy up-to-date.  Diet exercise discussed  2.  Depression clinically stable reviewed to maintain same meds  3.  Hyperlipidemia prior blood work reviewed compliance discussed appropriate blood work ordered medications refilled diet discussed  4.  Rhinosinusitis subacute will cover with antibiotics  As noted above

## 2017-05-27 ENCOUNTER — Other Ambulatory Visit: Payer: Self-pay

## 2017-05-27 ENCOUNTER — Encounter (HOSPITAL_COMMUNITY)
Admission: RE | Disposition: A | Discharge status: Home / Self Care | Payer: Self-pay | Source: Ambulatory Visit | Attending: Internal Medicine

## 2017-05-27 ENCOUNTER — Ambulatory Visit (HOSPITAL_COMMUNITY)
Admission: RE | Admit: 2017-05-27 | Discharge: 2017-05-27 | Disposition: A | Discharge status: Home / Self Care | Payer: Medicare Other | Source: Ambulatory Visit | Attending: Internal Medicine | Admitting: Internal Medicine

## 2017-05-27 ENCOUNTER — Encounter (HOSPITAL_COMMUNITY): Payer: Self-pay | Admitting: *Deleted

## 2017-05-27 DIAGNOSIS — K529 Noninfective gastroenteritis and colitis, unspecified: Secondary | ICD-10-CM | POA: Diagnosis not present

## 2017-05-27 DIAGNOSIS — K219 Gastro-esophageal reflux disease without esophagitis: Secondary | ICD-10-CM | POA: Insufficient documentation

## 2017-05-27 DIAGNOSIS — Z85828 Personal history of other malignant neoplasm of skin: Secondary | ICD-10-CM | POA: Insufficient documentation

## 2017-05-27 DIAGNOSIS — Z7901 Long term (current) use of anticoagulants: Secondary | ICD-10-CM | POA: Insufficient documentation

## 2017-05-27 DIAGNOSIS — M1990 Unspecified osteoarthritis, unspecified site: Secondary | ICD-10-CM | POA: Insufficient documentation

## 2017-05-27 DIAGNOSIS — I4891 Unspecified atrial fibrillation: Secondary | ICD-10-CM | POA: Diagnosis not present

## 2017-05-27 DIAGNOSIS — E785 Hyperlipidemia, unspecified: Secondary | ICD-10-CM | POA: Insufficient documentation

## 2017-05-27 DIAGNOSIS — Z800 Family history of malignant neoplasm of digestive organs: Secondary | ICD-10-CM | POA: Diagnosis not present

## 2017-05-27 DIAGNOSIS — Z803 Family history of malignant neoplasm of breast: Secondary | ICD-10-CM | POA: Diagnosis not present

## 2017-05-27 DIAGNOSIS — F329 Major depressive disorder, single episode, unspecified: Secondary | ICD-10-CM | POA: Insufficient documentation

## 2017-05-27 DIAGNOSIS — K621 Rectal polyp: Secondary | ICD-10-CM

## 2017-05-27 DIAGNOSIS — Z87891 Personal history of nicotine dependence: Secondary | ICD-10-CM | POA: Insufficient documentation

## 2017-05-27 DIAGNOSIS — Z9049 Acquired absence of other specified parts of digestive tract: Secondary | ICD-10-CM | POA: Diagnosis not present

## 2017-05-27 DIAGNOSIS — K6389 Other specified diseases of intestine: Secondary | ICD-10-CM | POA: Diagnosis not present

## 2017-05-27 DIAGNOSIS — K52832 Lymphocytic colitis: Secondary | ICD-10-CM | POA: Diagnosis not present

## 2017-05-27 DIAGNOSIS — R197 Diarrhea, unspecified: Secondary | ICD-10-CM

## 2017-05-27 DIAGNOSIS — Z79899 Other long term (current) drug therapy: Secondary | ICD-10-CM | POA: Diagnosis not present

## 2017-05-27 DIAGNOSIS — K5730 Diverticulosis of large intestine without perforation or abscess without bleeding: Secondary | ICD-10-CM | POA: Insufficient documentation

## 2017-05-27 DIAGNOSIS — F419 Anxiety disorder, unspecified: Secondary | ICD-10-CM | POA: Diagnosis not present

## 2017-05-27 HISTORY — PX: BIOPSY: SHX5522

## 2017-05-27 HISTORY — PX: COLONOSCOPY: SHX5424

## 2017-05-27 SURGERY — COLONOSCOPY
Anesthesia: Moderate Sedation

## 2017-05-27 MED ORDER — MIDAZOLAM HCL 5 MG/5ML IJ SOLN
INTRAMUSCULAR | Status: DC | PRN
  Administered 2017-05-27: 1 mg via INTRAVENOUS
  Administered 2017-05-27: 2 mg via INTRAVENOUS
  Administered 2017-05-27: 1 mg via INTRAVENOUS

## 2017-05-27 MED ORDER — MEPERIDINE HCL 50 MG/ML IJ SOLN
INTRAMUSCULAR | Status: AC
  Filled 2017-05-27: qty 1

## 2017-05-27 MED ORDER — MEPERIDINE HCL 100 MG/ML IJ SOLN
INTRAMUSCULAR | Status: DC | PRN
  Administered 2017-05-27: 25 mg via INTRAVENOUS
  Administered 2017-05-27: 50 mg via INTRAVENOUS

## 2017-05-27 MED ORDER — MIDAZOLAM HCL 5 MG/5ML IJ SOLN
INTRAMUSCULAR | Status: AC
  Filled 2017-05-27: qty 10

## 2017-05-27 MED ORDER — ONDANSETRON HCL 4 MG/2ML IJ SOLN
INTRAMUSCULAR | Status: AC
  Filled 2017-05-27: qty 2

## 2017-05-27 MED ORDER — MEPERIDINE HCL 100 MG/ML IJ SOLN
INTRAMUSCULAR | Status: AC
  Filled 2017-05-27: qty 2

## 2017-05-27 MED ORDER — SODIUM CHLORIDE 0.9 % IV SOLN
INTRAVENOUS | Status: DC
  Administered 2017-05-27: 1000 mL via INTRAVENOUS

## 2017-05-27 NOTE — Op Note (Signed)
[facility] Patient Name: Jon Jackson Procedure Date: 05/27/2017 11:50 AM MRN: [id number] Date of Birth: [birth year] Attending MD: Norvel Richards , MD CSN: [id number] Age: 67 Admit Type: Outpatient Procedure:                Colonoscopy Indications:              Chronic diarrhea Providers:                Norvel Richards, MD, Lurline Del, RN, Aram Candela Referring MD:             Rosemary Holms, MD Medicines:                Midazolam 4 mg IV, Meperidine 75 mg IV, Ondansetron                            4 mg IV Complications:            No immediate complications. Estimated Blood Loss:     Estimated blood loss was minimal. Procedure:                Pre-Anesthesia Assessment:                           - Prior to the procedure, a History and Physical                            was performed, and patient medications and                            allergies were reviewed. The patient's tolerance of                            previous anesthesia was also reviewed. The risks                            and benefits of the procedure and the sedation                            options and risks were discussed with the patient.                            All questions were answered, and informed consent                            was obtained. Prior Anticoagulants: The patient                            last took Eliquis (apixaban) 2 days prior to the                            procedure. ASA Grade Assessment: II - A patient  with mild systemic disease. After reviewing the                            risks and benefits, the patient was deemed in                            satisfactory condition to undergo the procedure.                           After obtaining informed consent, the colonoscope                            was passed under direct vision. Throughout the                            procedure, the patient's blood  pressure, pulse, and                            oxygen saturations were monitored continuously. The                            EC-[id number] ([id number]) scope was introduced through                            the anus and advanced to the 5 cm into the ileum.                            The colonoscopy was performed without difficulty.                            The patient tolerated the procedure well. The                            quality of the bowel preparation was adequate. The                            terminal ileum, ileocecal valve, appendiceal                            orifice, and rectum were photographed. The entire                            colon was well visualized. Scope In: 1:01:37 PM Scope Out: 1:16:43 PM Scope Withdrawal Time: 0 hours 12 minutes 51 seconds  Total Procedure Duration: 0 hours 15 minutes 6 seconds  Findings:      The perianal and digital rectal examinations were normal.      A few small-mouthed diverticula were found in the left colon. Surgical       anastomosis not apparent today. Normal-appearing distal 5 cm of terminal       ileum.      Two sessile polyps were found in the rectum and rectum (benign-appearing       lesion). The polyps were 3 to 5 mm in size. These polyps were removed  with a cold snare. Resection and retrieval were complete. Estimated       blood loss was minimal.      The exam was otherwise without abnormality on direct and retroflexion       views. Segmental biopsies of the right and left colon and rectum taken       for histologic study. Impression:               - Diverticulosis in the left colon.                           - Two benign appearing 3 to 5 mm polyps in the                            rectum and in the rectum, removed with a cold                            snare. Resected and retrieved.                           - The examination was otherwise normal on direct                            and retroflexion views. status  post segmental                            biopsy. Moderate Sedation:      Moderate (conscious) sedation was personally administered by the       endoscopist. The following parameters were monitored: oxygen saturation,       heart rate, blood pressure, respiratory rate, EKG, adequacy of pulmonary       ventilation, and response to care. Total physician intraservice time was       22 minutes. Recommendation:           - Patient has a contact number available for                            emergencies. The signs and symptoms of potential                            delayed complications were discussed with the                            patient. Return to normal activities tomorrow.                            Written discharge instructions were provided to the                            patient.                           - Resume previous diet.                           - Continue present medications. Resume Eliquis  today.                           - Repeat colonoscopy date to be determined after                            pending pathology results are reviewed for                            surveillance based on pathology results.                           - Return to GI clinic (date not yet determined). Procedure Code(s):        --- Professional ---                           [phone], Colonoscopy, flexible; with removal of                            tumor(s), polyp(s), or other lesion(s) by snare                            technique                           99152, Moderate sedation services provided by the                            same physician or other qualified health care                            professional performing the diagnostic or                            therapeutic service that the sedation supports,                            requiring the presence of an independent trained                            observer to assist in the monitoring of the                             patient's level of consciousness and physiological                            status; initial 15 minutes of intraservice time,                            patient age 76 years or older Diagnosis Code(s):        --- Professional ---                           K62.1, Rectal polyp  K52.9, Noninfective gastroenteritis and colitis,                            unspecified                           K57.30, Diverticulosis of large intestine without                            perforation or abscess without bleeding CPT copyright 2016 [employer]. All rights reserved. The codes documented in this report are preliminary and upon coder review may  be revised to meet current compliance requirements. Jon Jackson. Jon Daffern, MD Norvel Richards, MD 05/27/2017 1:26:29 PM This report has been signed electronically. Number of Addenda: 0

## 2017-05-27 NOTE — Discharge Instructions (Signed)
Colonoscopy Discharge Instructions  Read the instructions outlined below and refer to this sheet in the next few weeks. These discharge instructions provide you with general information on caring for yourself after you leave the hospital. Your doctor may also give you specific instructions. While your treatment has been planned according to the most current medical practices available, unavoidable complications occasionally occur. If you have any problems or questions after discharge, call Dr. [name] at [phone]. ACTIVITY  You may resume your regular activity, but move at a slower pace for the next 24 hours.   Take frequent rest periods for the next 24 hours.   Walking will help get rid of the air and reduce the bloated feeling in your belly (abdomen).   No driving for 24 hours (because of the medicine (anesthesia) used during the test).    Do not sign any important legal documents or operate any machinery for 24 hours (because of the anesthesia used during the test).  NUTRITION  Drink plenty of fluids.   You may resume your normal diet as instructed by your doctor.   Begin with a light meal and progress to your normal diet. Heavy or fried foods are harder to digest and may make you feel sick to your stomach (nauseated).   Avoid alcoholic beverages for 24 hours or as instructed.  MEDICATIONS  You may resume your normal medications unless your doctor tells you otherwise.  WHAT YOU CAN EXPECT TODAY  Some feelings of bloating in the abdomen.   Passage of more gas than usual.   Spotting of blood in your stool or on the toilet paper.  IF YOU HAD POLYPS REMOVED DURING THE COLONOSCOPY:  No aspirin products for 7 days or as instructed.   No alcohol for 7 days or as instructed.   Eat a soft diet for the next 24 hours.  FINDING OUT THE RESULTS OF YOUR TEST Not all test results are available during your visit. If your test results are not back during the visit, make an appointment  with your caregiver to find out the results. Do not assume everything is normal if you have not heard from your caregiver or the medical facility. It is important for you to follow up on all of your test results.  SEEK IMMEDIATE MEDICAL ATTENTION IF:  You have more than a spotting of blood in your stool.   Your belly is swollen (abdominal distention).   You are nauseated or vomiting.   You have a temperature over 101.   You have abdominal pain or discomfort that is severe or gets worse throughout the day.    Colon polyp and diverticulosis information provided  Further recommendations to follow pending review of pathology report  Resume Eliquis today.      Colon Polyps Polyps are tissue growths inside the body. Polyps can grow in many places, including the large intestine (colon). A polyp may be a round bump or a mushroom-shaped growth. You could have one polyp or several. Most colon polyps are noncancerous (benign). However, some colon polyps can become cancerous over time. What are the causes? The exact cause of colon polyps is not known. What increases the risk? This condition is more likely to develop in people who:  Have a family history of colon cancer or colon polyps.  Are older than 67 or older than 45 if they are African American.  Have inflammatory bowel disease, such as ulcerative colitis or Crohn disease.  Are overweight.  Smoke cigarettes.  Do  not get enough exercise.  Drink too much alcohol.  Eat a diet that is: ? High in fat and red meat. ? Low in fiber.  Had childhood cancer that was treated with abdominal radiation.  What are the signs or symptoms? Most polyps do not cause symptoms. If you have symptoms, they may include:  Blood coming from your rectum when having a bowel movement.  Blood in your stool.The stool may look dark red or black.  A change in bowel habits, such as constipation or diarrhea.  How is this diagnosed? This condition  is diagnosed with a colonoscopy. This is a procedure that uses a lighted, flexible scope to look at the inside of your colon. How is this treated? Treatment for this condition involves removing any polyps that are found. Those polyps will then be tested for cancer. If cancer is found, your health care provider will talk to you about options for colon cancer treatment. Follow these instructions at home: Diet  Eat plenty of fiber, such as fruits, vegetables, and whole grains.  Eat foods that are high in calcium and vitamin D, such as milk, cheese, yogurt, eggs, liver, fish, and broccoli.  Limit foods high in fat, red meats, and processed meats, such as hot dogs, sausage, bacon, and lunch meats.  Maintain a healthy weight, or lose weight if recommended by your health care provider. General instructions  Do not smoke cigarettes.  Do not drink alcohol excessively.  Keep all follow-up visits as told by your health care provider. This is important. This includes keeping regularly scheduled colonoscopies. Talk to your health care provider about when you need a colonoscopy.  Exercise every day or as told by your health care provider. Contact a health care provider if:  You have new or worsening bleeding during a bowel movement.  You have new or increased blood in your stool.  You have a change in bowel habits.  You unexpectedly lose weight. This information is not intended to replace advice given to you by your health care provider. Make sure you discuss any questions you have with your health care provider. Document Released: 12/12/2003 Document Revised: 08/23/2015 Document Reviewed: 02/05/2015 Elsevier Interactive Patient Education  [employer].    Diverticulosis Diverticulosis is a condition that develops when small pouches (diverticula) form in the wall of the large intestine (colon). The colon is where water is absorbed and stool is formed. The pouches form when the inside  layer of the colon pushes through weak spots in the outer layers of the colon. You may have a few pouches or many of them. What are the causes? The cause of this condition is not known. What increases the risk? The following factors may make you more likely to develop this condition:  Being older than age 54. Your risk for this condition increases with age. Diverticulosis is rare among people younger than age 11. By age 73, many people have it.  Eating a low-fiber diet.  Having frequent constipation.  Being overweight.  Not getting enough exercise.  Smoking.  Taking over-the-counter pain medicines, like aspirin and ibuprofen.  Having a family history of diverticulosis.  What are the signs or symptoms? In most people, there are no symptoms of this condition. If you do have symptoms, they may include:  Bloating.  Cramps in the abdomen.  Constipation or diarrhea.  Pain in the lower left side of the abdomen.  How is this diagnosed? This condition is most often diagnosed during an exam for  other colon problems. Because diverticulosis usually has no symptoms, it often cannot be diagnosed independently. This condition may be diagnosed by:  Using a flexible scope to examine the colon (colonoscopy).  Taking an X-ray of the colon after dye has been put into the colon (barium enema).  Doing a CT scan.  How is this treated? You may not need treatment for this condition if you have never developed an infection related to diverticulosis. If you have had an infection before, treatment may include:  Eating a high-fiber diet. This may include eating more fruits, vegetables, and grains.  Taking a fiber supplement.  Taking a live bacteria supplement (probiotic).  Taking medicine to relax your colon.  Taking antibiotic medicines.  Follow these instructions at home:  Drink 6-8 glasses of water or more each day to prevent constipation.  Try not to strain when you have a bowel  movement.  If you have had an infection before: ? Eat more fiber as directed by your health care provider or your diet and nutrition specialist (dietitian). ? Take a fiber supplement or probiotic, if your health care provider approves.  Take over-the-counter and prescription medicines only as told by your health care provider.  If you were prescribed an antibiotic, take it as told by your health care provider. Do not stop taking the antibiotic even if you start to feel better.  Keep all follow-up visits as told by your health care provider. This is important. Contact a health care provider if:  You have pain in your abdomen.  You have bloating.  You have cramps.  You have not had a bowel movement in 3 days. Get help right away if:  Your pain gets worse.  Your bloating becomes very bad.  You have a fever or chills, and your symptoms suddenly get worse.  You vomit.  You have bowel movements that are bloody or black.  You have bleeding from your rectum. Summary  Diverticulosis is a condition that develops when small pouches (diverticula) form in the wall of the large intestine (colon).  You may have a few pouches or many of them.  This condition is most often diagnosed during an exam for other colon problems.  If you have had an infection related to diverticulosis, treatment may include increasing the fiber in your diet, taking supplements, or taking medicines. This information is not intended to replace advice given to you by your health care provider. Make sure you discuss any questions you have with your health care provider. Document Released: 12/13/2003 Document Revised: 02/04/2016 Document Reviewed: 02/04/2016 Elsevier Interactive Patient Education  2017 [employer].

## 2017-05-27 NOTE — H&P (Signed)
'@LOGO' @   Primary Care Physician:  Mikey Kirschner, MD Primary Gastroenterologist:  Dr. [name] Pre-Procedure History & Physical: HPI:  Jon Jackson is a 67 y.o. male here for further evaluation of chronic diarrhea. History of colon cancer in his brother at young age. Status post prior sigmoid resection for diverticulitis.  Past Medical History:  Diagnosis Date  . Anxiety   . Arthritis   . Atrial fibrillation ([place])   . Basal cell carcinoma   . Depression   . GERD (gastroesophageal reflux disease)   . Hyperlipidemia   . Squamous carcinoma     Past Surgical History:  Procedure Laterality Date  . ATRIAL FIBRILLATION ABLATION  07/22/2016  . ATRIAL FIBRILLATION ABLATION N/A 07/22/2016   Procedure: Atrial Fibrillation Ablation;  Surgeon: Will Meredith Leeds, MD;  Location: [facility];  Service: Cardiovascular;  Laterality: N/A;  . BASAL CELL CARCINOMA EXCISION     forehead; top of scalp  . CARDIOVERSION N/A 04/07/2016   Procedure: CARDIOVERSION;  Surgeon: Satira Sark, MD;  Location: [facility];  Service: Cardiovascular;  Laterality: N/A;  . COLECTOMY  1990s   removal of section due to strangulated bowel.  . COLONOSCOPY N/A 05/30/2013   Procedure: COLONOSCOPY;  Surgeon: [name], MD;  Location: [facility];  Service: Endoscopy;  Laterality: N/A;  8:45 AM  . COLONOSCOPY    . INGUINAL HERNIA REPAIR Right 1990s  . SHOULDER ARTHROSCOPY WITH ROTATOR CUFF REPAIR AND SUBACROMIAL DECOMPRESSION Left 05/15/2016   Procedure: LEFT SHOULDER ARTHROSCOPY WITH ROTATOR CUFF REPAIR AND SUBACROMIAL DECOMPRESSION AND DISTAL CLAVICLE RESECTION;  Surgeon: [name], MD;  Location: [place] [facility];  Service: Orthopedics;  Laterality: Left;  requests 2hrs  . SHOULDER SURGERY Right   . SQUAMOUS CELL CARCINOMA EXCISION     scalp  . TONSILLECTOMY      Prior to Admission medications   Medication Sig Start Date End Date Taking? Authorizing Provider  amoxicillin (AMOXIL) 500 MG capsule Take 1 capsule  (500 mg total) by mouth 3 (three) times daily. 05/14/17  Yes Mikey Kirschner, MD  citalopram (CELEXA) 20 MG tablet Take 1 tablet (20 mg total) by mouth daily. 05/14/17  Yes Mikey Kirschner, MD  ELIQUIS 5 MG TABS tablet TAKE ONE TABLET BY MOUTH TWICE DAILY 04/07/17  Yes Satira Sark, MD  ibuprofen (ADVIL,MOTRIN) 200 MG tablet Take 400-600 mg by mouth 3 (three) times daily as needed for headache or moderate pain.   Yes [provider]  levocetirizine (XYZAL) 5 MG tablet Take 5 mg by mouth daily.   Yes [provider]  Melatonin 10 MG TABS Take 10 mg by mouth at bedtime as needed (sleep).   Yes [provider]  Misc Natural Products (OSTEO BI-FLEX JOINT SHIELD PO) Take 2 tablets by mouth daily.    Yes [provider]  Multiple Vitamin (MULTIVITAMIN) tablet Take 1 tablet by mouth daily.   Yes [provider]  Na Sulfate-K Sulfate-Mg Sulf (SUPREP BOWEL PREP KIT) 17.5-3.13-1.6 GM/177ML SOLN Take 1 kit by mouth as directed. 04/23/17  Yes Rourk, Cristopher Estimable, MD  Naphazoline-Pheniramine (ALLERGY EYE OP) Place 1-2 drops into both eyes 3 (three) times daily as needed (for seasonal allergy/itchy eyes).   Yes [provider]  omeprazole (PRILOSEC) 40 MG capsule TAKE 1 CAPSULE (40 MG TOTAL) BY MOUTH DAILY. 11/10/16  Yes Mikey Kirschner, MD  simvastatin (ZOCOR) 20 MG tablet Take 1 tablet (20 mg total) by mouth daily. 05/14/17  Yes Baltazar Apo  S, MD  acetaminophen (TYLENOL) 500 MG tablet Take 1,000-1,500 mg by mouth 3 (three) times daily as needed for moderate pain or headache.    [provider]  dicyclomine (BENTYL) 10 MG capsule Take 1 capsule (10 mg total) by mouth 4 (four) times daily -  before meals and at bedtime for 14 days. Patient not taking: Reported on 05/19/2017 03/02/17 05/19/17  Annitta Needs, NP    Allergies as of 04/23/2017  . (No Known Allergies)    Family History  Problem Relation Age of Onset  . Colon cancer Brother 101  .  Throat cancer Brother   . Breast cancer Mother     Social History   Socioeconomic History  . Marital status: Married    Spouse name: Not on file  . Number of children: Not on file  . Years of education: Not on file  . Highest education level: Not on file  Social Needs  . Financial resource strain: Not on file  . Food insecurity - worry: Not on file  . Food insecurity - inability: Not on file  . Transportation needs - medical: Not on file  . Transportation needs - non-medical: Not on file  Occupational History  . Not on file  Tobacco Use  . Smoking status: Former Smoker    Packs/day: 1.00    Years: 47.00    Pack years: 47.00    Types: Cigars    Last attempt to quit: 02/01/2016    Years since quitting: 1.3  . Smokeless tobacco: Never Used  Substance and Sexual Activity  . Alcohol use: Yes    Alcohol/week: 7.2 oz    Types: 12 Cans of beer per week    Comment: daily 3-4 in the summer; soemtimes more on weekends.  . Drug use: No  . Sexual activity: Yes  Other Topics Concern  . Not on file  Social History Narrative  . Not on file    Review of Systems: See HPI, otherwise negative ROS  Physical Exam: BP 134/85   Pulse 74   Temp 98 F (36.7 C) (Oral)   Resp 11   Ht '5\' 10"'  (1.778 m)   Wt 210 lb (95.3 kg)   SpO2 99%   BMI 30.13 kg/m  General:   Alert,  Well-developed, well-nourished, pleasant and cooperative in NAD Lungs:  Clear throughout to auscultation.   No wheezes, crackles, or rhonchi. No acute distress. Heart:  Regular rate and rhythm; no murmurs, clicks, rubs,  or gallops. Abdomen: Non-distended, normal bowel sounds.  Soft and nontender without appreciable mass or hepatosplenomegaly.  Pulses:  Normal pulses noted. Extremities:  Without clubbing or edema.  Impression:  Pleasant 67 year old gentleman with chronic diarrhea. Last colonoscopy 4 years ago. Positive family history of colon cancer first-degree relative.  Recommendations:  I have offered the  patient a diagnostic colonoscopy today.  The risks, benefits, limitations, alternatives and imponderables have been reviewed with the patient. Questions have been answered. All parties are agreeable.    Notice: This dictation was prepared with Dragon dictation along with smaller phrase technology. Any transcriptional errors that result from this process are unintentional and may not be corrected upon review.

## 2017-05-29 ENCOUNTER — Encounter (HOSPITAL_COMMUNITY): Payer: Self-pay | Admitting: Internal Medicine

## 2017-05-29 ENCOUNTER — Encounter: Payer: Self-pay | Admitting: Internal Medicine

## 2017-06-01 ENCOUNTER — Other Ambulatory Visit: Payer: Self-pay

## 2017-06-01 ENCOUNTER — Telehealth: Payer: Self-pay

## 2017-06-01 MED ORDER — BUDESONIDE 3 MG PO CPEP: 9.0000 mg | ORAL_CAPSULE | Freq: Every day | ORAL | 1 refills | Status: DC

## 2017-06-01 NOTE — Telephone Encounter (Signed)
Pt notified and will pick medication up. Letter mailed to pt as well.

## 2017-06-01 NOTE — Telephone Encounter (Signed)
Per RMR- Send letter to patient.  Send copy of letter with path to referring provider and PCP.   Patient needs office visit with me in 6 weeks.   Elmo Putt, let's call in a prescription for Entocort 3 mg tablets. Dispense 90 with 1 refill. Patient is to take 3 tablets or 9 mg daily

## 2017-06-01 NOTE — Telephone Encounter (Signed)
OV made and reminder in epic °

## 2017-06-02 ENCOUNTER — Telehealth: Payer: Self-pay | Admitting: Internal Medicine

## 2017-06-02 NOTE — Telephone Encounter (Signed)
Pt called to say that the prescription that was sent in on him yesterday (Entocort) was $1300 at [facility] and he needed something else because that was too expensive. Please advise and call [phone]

## 2017-06-02 NOTE — Telephone Encounter (Signed)
Spoke with pt and his cost would be $437.00. Medication is covered but through [facility], this will be the cost for pt. I told the pt I could look into a tier exception and he said he doesn't want to pay. Is there anything else pt can try?

## 2017-06-03 NOTE — Telephone Encounter (Signed)
We typically have him take it for 8 weeks then taper down the dose over an additional month (with fewer pills and therefore less cost while tapering).

## 2017-06-03 NOTE — Telephone Encounter (Signed)
Uceris wouldn't likely be any cheaper. We can call [facility] and see what the cost would be for compounded Budesonide and let the patient know.  If he wants to go that route, would like it run by RMR first to make sure he's ok with that as he did the TCS and will be seeing the patient on follow-up.

## 2017-06-03 NOTE — Telephone Encounter (Signed)
[facility]'s price is $55.00. Pt would like to know if the medication is something that will be taken for 30 days or taken longer?

## 2017-06-04 NOTE — Telephone Encounter (Signed)
EG pt is interested in try the medication for $55.00 at [facility]. Pt is aware that it must be discussed with RMR.

## 2017-06-05 NOTE — Telephone Encounter (Signed)
Dr. [name], just wanna make sure you're ok with this. Patient can't afford Entocort, but [facility] can make compounded Budesonide for lower/affordable cost. He has an upcoming follow-up with you.

## 2017-06-06 NOTE — Telephone Encounter (Signed)
Agree; good idea

## 2017-06-09 NOTE — Telephone Encounter (Signed)
FYI ok to go with compounded Budesonide at [facility].

## 2017-06-09 NOTE — Telephone Encounter (Signed)
Pt notified Rx called into pharmacy 

## 2017-07-22 ENCOUNTER — Ambulatory Visit: Payer: Medicare Other | Admitting: Nurse Practitioner

## 2017-09-29 DIAGNOSIS — L570 Actinic keratosis: Secondary | ICD-10-CM | POA: Diagnosis not present

## 2017-10-13 DIAGNOSIS — M25511 Pain in right shoulder: Secondary | ICD-10-CM | POA: Diagnosis not present

## 2017-10-22 ENCOUNTER — Other Ambulatory Visit: Payer: Self-pay | Admitting: Family Medicine

## 2017-11-16 NOTE — Progress Notes (Signed)
Electrophysiology Office Note   Date:  11/17/2017   ID:  Jon Jackson, DOB September 06, 1950, MRN [id number]  PCP:  Mikey Kirschner, MD  Cardiologist:  [name] Primary Electrophysiologist:  Eliya Geiman Meredith Leeds, MD    No chief complaint on file.    History of Present Illness: Jon Jackson is a 67 y.o. male who presents today for electrophysiology evaluation.   Jon Jackson is a 67 y.o. male who is being seen today for the evaluation of atrial fibrillation at the request of Luking, [name], MD. He has a history of persistent atrial fibrillation. Had AF ablation on 07/22/16.  Today, denies symptoms of palpitations, chest pain, shortness of breath, orthopnea, PND, lower extremity edema, claudication, dizziness, presyncope, syncope, bleeding, or neurologic sequela. The patient is tolerating medications without difficulties.  Overall he is doing well.  He is noted no further episodes of atrial fibrillation.  Continues to exercise and does not have any restriction in his daily activities.   Past Medical History:  Diagnosis Date  . Anxiety   . Arthritis   . Atrial fibrillation ([place])   . Basal cell carcinoma   . Depression   . GERD (gastroesophageal reflux disease)   . Hyperlipidemia   . Squamous carcinoma    Past Surgical History:  Procedure Laterality Date  . ATRIAL FIBRILLATION ABLATION  07/22/2016  . ATRIAL FIBRILLATION ABLATION N/A 07/22/2016   Procedure: Atrial Fibrillation Ablation;  Surgeon: Ashli Selders Meredith Leeds, MD;  Location: [facility];  Service: Cardiovascular;  Laterality: N/A;  . BASAL CELL CARCINOMA EXCISION     forehead; top of scalp  . BIOPSY  05/27/2017   Procedure: BIOPSY;  Surgeon: [name], MD;  [facility];  Service: Endoscopy;;  right colon, left colon;  . CARDIOVERSION N/A 04/07/2016   Procedure: CARDIOVERSION;  Surgeon: Satira Sark, MD;  Location: [facility];  Service: Cardiovascular;  Laterality: N/A;  . COLECTOMY  1990s     removal of section due to strangulated bowel.  . COLONOSCOPY N/A 05/30/2013   Procedure: COLONOSCOPY;  Surgeon: [name], MD;  [facility];  Service: Endoscopy;  Laterality: N/A;  8:45 AM  . COLONOSCOPY    . COLONOSCOPY N/A 05/27/2017   Procedure: COLONOSCOPY;  Surgeon: [name], MD;  [facility];  Service: Endoscopy;  Laterality: N/A;  1:00pm  . INGUINAL HERNIA REPAIR Right 1990s  . SHOULDER ARTHROSCOPY WITH ROTATOR CUFF REPAIR AND SUBACROMIAL DECOMPRESSION Left 05/15/2016   Procedure: LEFT SHOULDER ARTHROSCOPY WITH ROTATOR CUFF REPAIR AND SUBACROMIAL DECOMPRESSION AND DISTAL CLAVICLE RESECTION;  Surgeon: [name], MD;  Location: [facility];  Service: Orthopedics;  Laterality: Left;  requests 2hrs  . SHOULDER SURGERY Right   . SQUAMOUS CELL CARCINOMA EXCISION     scalp  . TONSILLECTOMY       Current Outpatient Medications  Medication Sig Dispense Refill  . acetaminophen (TYLENOL) 500 MG tablet Take 1,000-1,500 mg by mouth 3 (three) times daily as needed for moderate pain or headache.    Marland Kitchen amoxicillin (AMOXIL) 500 MG capsule Take 1 capsule (500 mg total) by mouth 3 (three) times daily. 30 capsule 0  . budesonide (ENTOCORT EC) 3 MG 24 hr capsule Take 3 capsules (9 mg total) by mouth daily. 90 capsule 1  . citalopram (CELEXA) 20 MG tablet Take 1 tablet (20 mg total) by mouth daily. 90 tablet 1  . ELIQUIS 5 MG TABS tablet TAKE ONE TABLET BY MOUTH  TWICE DAILY 60 tablet 6  . ibuprofen (ADVIL,MOTRIN) 200 MG tablet Take 400-600 mg by mouth 3 (three) times daily as needed for headache or moderate pain.    Marland Kitchen levocetirizine (XYZAL) 5 MG tablet Take 5 mg by mouth daily.    . Melatonin 10 MG TABS Take 10 mg by mouth at bedtime as needed (sleep).    . Misc Natural Products (OSTEO BI-FLEX JOINT SHIELD PO) Take 2 tablets by mouth daily.     . Multiple Vitamin (MULTIVITAMIN) tablet Take 1 tablet by mouth daily.    . Na Sulfate-K Sulfate-Mg Sulf (SUPREP BOWEL PREP KIT)  17.5-3.13-1.6 GM/177ML SOLN Take 1 kit by mouth as directed. 1 Bottle 0  . Naphazoline-Pheniramine (ALLERGY EYE OP) Place 1-2 drops into both eyes 3 (three) times daily as needed (for seasonal allergy/itchy eyes).    Marland Kitchen omeprazole (PRILOSEC) 40 MG capsule TAKE 1 CAPSULE BY MOUTH ONCE DAILY 90 capsule 1  . simvastatin (ZOCOR) 20 MG tablet Take 1 tablet (20 mg total) by mouth daily. 90 tablet 1  . dicyclomine (BENTYL) 10 MG capsule Take 1 capsule (10 mg total) by mouth 4 (four) times daily -  before meals and at bedtime for 14 days. (Patient not taking: Reported on 05/19/2017) 120 capsule 1   No current facility-administered medications for this visit.     Allergies:   Patient has no known allergies.   Social History:  The patient  reports that he quit smoking about 21 months ago. His smoking use included cigars. He has a 47.00 pack-year smoking history. He has never used smokeless tobacco. He reports that he drinks about 12.0 standard drinks of alcohol per week. He reports that he does not use drugs.   Family History:  The patient's family history includes Breast cancer in his mother; Colon cancer (age of onset: 3) in his brother; Throat cancer in his brother.    ROS:  Please see the history of present illness.   Otherwise, review of systems is positive for none.   All other systems are reviewed and negative.   PHYSICAL EXAM: VS:  BP 126/84   Pulse 79   Ht _0  (1.778 m)   Wt 223 lb 12.8 oz (101.5 kg)   SpO2 96%   BMI 32.11 kg/m  , BMI Body mass index is 32.11 kg/m. GEN: Well nourished, well developed, in no acute distress  HEENT: normal  Neck: no JVD, carotid bruits, or masses Cardiac: RRR; no murmurs, rubs, or gallops,no edema  Respiratory:  clear to auscultation bilaterally, normal work of breathing GI: soft, nontender, nondistended, + BS MS: no deformity or atrophy  Skin: warm and dry Neuro:  Strength and sensation are intact Psych: euthymic mood, full affect  EKG:  EKG is  ordered today. Personal review of the ekg ordered shows sinus rhythm, nonspecific T wave changes, rate 79   Recent Labs: 02/24/2017: ALT 23; BUN 14; Creatinine, Ser 0.91; Hemoglobin 15.0; Platelets 217; Potassium 4.3; Sodium 144; TSH 1.520    Lipid Panel     Component Value Date/Time   CHOL 154 11/05/2016 0946   TRIG 59 11/05/2016 0946   HDL 51 11/05/2016 0946   CHOLHDL 3.0 11/05/2016 0946   CHOLHDL 3.1 11/18/2014 0846   VLDL 14 11/18/2014 0846   LDLCALC 91 11/05/2016 0946     Wt Readings from Last 3 Encounters:  11/17/17 223 lb 12.8 oz (101.5 kg)  05/27/17 210 lb (95.3 kg)  05/14/17 218 lb (98.9 kg)  Other studies Reviewed: Additional studies/ records that were reviewed today include: TTE 03/04/16  Review of the above records today demonstrates:  - Left ventricle: The cavity size was normal. Wall thickness was   increased in a pattern of mild LVH. The estimated ejection   fraction was 55%. Wall motion was normal; there were no regional   wall motion abnormalities. The study is not technically   sufficient to allow evaluation of LV diastolic function. - Aortic valve: Trileaflet; moderately calcified leaflets.   Noncoronary cusp mobility was severely restricted. There was mild   stenosis. Aortic valve area approximately 1.5-1.7 cm^2 by   planimetry. - Aortic root: The aortic root was mildly ectatic. - Mitral valve: Calcified annulus. There was trivial regurgitation. - Left atrium: The atrium was mildly dilated. - Right atrium: The atrium was mildly to moderately dilated. - Atrial septum: No defect or patent foramen ovale was identified. - Tricuspid valve: There was trivial regurgitation. - Pulmonary arteries: Systolic pressure could not be accurately   estimated. - Pericardium, extracardiac: There was no pericardial effusion.   ASSESSMENT AND PLAN:  1.  Persistent atrial fibrillation: Status post atrial fibrillation ablation 07/22/2016.  Currently on Eliquis.   Has not had no recurrence.  No changes.  This patients CHA2DS2-VASc Score and unadjusted Ischemic Stroke Rate (% per year) is equal to 2.2 % stroke rate/year from a score of 2  Above score calculated as 1 point each if present [CHF, HTN, DM, Vascular=MI/PAD/Aortic Plaque, Age if 65-74, or Male] Above score calculated as 2 points each if present [Age > 75, or Stroke/TIA/TE]  2. Essential hypertension: Blood pressure well controlled today.  No changes.  3. Hyperlipidemia: Continue simvastatin per primary physician     Current medicines are reviewed at length with the patient today.   The patient does not have concerns regarding his medicines.  The following changes were made today: None  Labs/ tests ordered today include:  Orders Placed This Encounter  Procedures  . EKG 12-Lead     Disposition:   FU with Buffey Zabinski 6 months  Signed, Skye Plamondon Meredith Leeds, MD  11/17/2017 12:02 PM     [address] [place] [address] [phone] (office) [phone] (fax)

## 2017-11-17 ENCOUNTER — Ambulatory Visit (INDEPENDENT_AMBULATORY_CARE_PROVIDER_SITE_OTHER): Payer: Medicare Other | Admitting: Cardiology

## 2017-11-17 ENCOUNTER — Encounter: Payer: Self-pay | Admitting: Cardiology

## 2017-11-17 VITALS — BP 126/84 | HR 79 | Ht 70.0 in | Wt 223.8 lb

## 2017-11-17 DIAGNOSIS — I481 Persistent atrial fibrillation: Secondary | ICD-10-CM | POA: Diagnosis not present

## 2017-11-17 DIAGNOSIS — I4819 Other persistent atrial fibrillation: Secondary | ICD-10-CM

## 2017-11-17 DIAGNOSIS — E785 Hyperlipidemia, unspecified: Secondary | ICD-10-CM

## 2017-11-17 DIAGNOSIS — I10 Essential (primary) hypertension: Secondary | ICD-10-CM

## 2017-11-17 NOTE — Patient Instructions (Signed)
Medication Instructions:  Your physician recommends that you continue on your current medications as directed. Please refer to the Current Medication list given to you today.  * If you need a refill on your cardiac medications before your next appointment, please call your pharmacy.   Labwork: None ordered *We will only notify you of abnormal results, otherwise continue current treatment plan.  Testing/Procedures: None ordered  Follow-Up: Your physician wants you to follow-up in: 6 months with Dr. Camnitz.  You will receive a reminder letter in the mail two months in advance. If you don't receive a letter, please call our office to schedule the follow-up appointment.  *Please note that any paperwork needing to be filled out by the provider will need to be addressed at the front desk prior to seeing the provider. Please note that any FMLA, disability or other documents regarding health condition is subject to a $25.00 charge that must be received prior to completion of paperwork in the form of a money order or check.  Thank you for choosing [facility]!!   Wendy Mikles, RN [phone]        

## 2017-11-18 ENCOUNTER — Encounter: Payer: Self-pay | Admitting: Family Medicine

## 2017-11-18 ENCOUNTER — Ambulatory Visit (INDEPENDENT_AMBULATORY_CARE_PROVIDER_SITE_OTHER): Payer: Medicare Other | Admitting: Family Medicine

## 2017-11-18 VITALS — BP 138/85 | Ht 70.0 in | Wt 222.2 lb

## 2017-11-18 DIAGNOSIS — F320 Major depressive disorder, single episode, mild: Secondary | ICD-10-CM

## 2017-11-18 DIAGNOSIS — E785 Hyperlipidemia, unspecified: Secondary | ICD-10-CM

## 2017-11-18 DIAGNOSIS — R002 Palpitations: Secondary | ICD-10-CM | POA: Diagnosis not present

## 2017-11-18 DIAGNOSIS — N400 Enlarged prostate without lower urinary tract symptoms: Secondary | ICD-10-CM | POA: Diagnosis not present

## 2017-11-18 DIAGNOSIS — Z79899 Other long term (current) drug therapy: Secondary | ICD-10-CM | POA: Diagnosis not present

## 2017-11-18 DIAGNOSIS — I10 Essential (primary) hypertension: Secondary | ICD-10-CM

## 2017-11-18 MED ORDER — OMEPRAZOLE 40 MG PO CPDR: DELAYED_RELEASE_CAPSULE | ORAL | 1 refills | Status: DC

## 2017-11-18 MED ORDER — CITALOPRAM HYDROBROMIDE 20 MG PO TABS: 20.0000 mg | ORAL_TABLET | Freq: Every day | ORAL | 1 refills | Status: DC

## 2017-11-18 MED ORDER — SIMVASTATIN 20 MG PO TABS: 20.0000 mg | ORAL_TABLET | Freq: Every day | ORAL | 1 refills | Status: DC

## 2017-11-18 MED ORDER — VARENICLINE TARTRATE 0.5 MG X 11 & 1 MG X 42 PO MISC: ORAL | 0 refills | Status: DC

## 2017-11-18 MED ORDER — VARENICLINE TARTRATE 1 MG PO TABS: 1.0000 mg | ORAL_TABLET | Freq: Two times a day (BID) | ORAL | 2 refills | Status: DC

## 2017-11-18 NOTE — Progress Notes (Signed)
   Subjective:    Patient ID: Jon Jackson, male    DOB: 08/28/1950, 67 y.o.   MRN: [id number]  Hyperlipidemia  This is a chronic problem. There are no compliance problems.    Pt here for 6 month follow up.  Patient notes ongoing compliance with antidepressant medication. No obvious side effects. Reports does not miss a dose. Overall continues to help depression substantially. No thoughts of homicide or suicide. Would like to maintain medication.  Staying active, not speciri exrise   Patient continues to take lipid medication regularly. No obvious side effects from it. Generally does not miss a dose. Prior blood work results are reviewed with patient. Patient continues to work on fat intake in diet  Patient unfortunately has returned to smoking.  Would like something to get off.  In the past has had some benefit from prescription Chantix.  Review of Systems No headache, no major weight loss or weight gain, no chest pain no back pain abdominal pain no change in bowel habits complete ROS otherwise negative     Objective:   Physical Exam  Alert and oriented, vitals reviewed and stable, NAD ENT-TM's and ext canals WNL bilat via otoscopic exam Soft palate, tonsils and post pharynx WNL via oropharyngeal exam Neck-symmetric, no masses; thyroid nonpalpable and nontender Pulmonary-no tachypnea or accessory muscle use; Clear without wheezes via auscultation Card--no abnrml murmurs, rhythm reg and rate WNL Carotid pulses symmetric, without bruits       Assessment & Plan:  1 impression 1 hyperlipidemia.  Blood work results reviewed.  Compliance discussed.  Diet discussed.  Patient to maintain same meds  2.  Chronic smoker.  Discussion held.  Benefited from Chantix in the past  3.  Depression clinically stable meds helping will maintain rationale discussed  4.  Hypertension.  Blood pressure numbers decent on repeat.  Slightly borderline.  Not enough for medication rationale  discussed.  Meds refilled.  Diet exercise discussed and encouraged/appropriate blood work further recommendations based on results  Greater than 50% of this 25 minute face to face visit was spent in counseling and discussion and coordination of care regarding the above diagnosis/diagnosies

## 2017-12-16 DIAGNOSIS — Z79899 Other long term (current) drug therapy: Secondary | ICD-10-CM | POA: Diagnosis not present

## 2017-12-16 DIAGNOSIS — N400 Enlarged prostate without lower urinary tract symptoms: Secondary | ICD-10-CM | POA: Diagnosis not present

## 2017-12-16 DIAGNOSIS — I10 Essential (primary) hypertension: Secondary | ICD-10-CM | POA: Diagnosis not present

## 2017-12-16 DIAGNOSIS — E785 Hyperlipidemia, unspecified: Secondary | ICD-10-CM | POA: Diagnosis not present

## 2017-12-17 LAB — LIPID PANEL
Chol/HDL Ratio: 3.6 ratio (ref 0.0–5.0)
Cholesterol, Total: 175 mg/dL (ref 100–199)
HDL: 49 mg/dL (ref >39)
LDL CALC: 102 mg/dL — AB (ref 0–99)
Triglycerides: 118 mg/dL (ref 0–149)
VLDL CHOLESTEROL CAL: 24 mg/dL (ref 5–40)

## 2017-12-17 LAB — HEPATIC FUNCTION PANEL
ALK PHOS: 92 IU/L (ref 39–117)
ALT: 27 IU/L (ref 0–44)
AST: 21 IU/L (ref 0–40)
Albumin: 4.5 g/dL (ref 3.6–4.8)
Bilirubin Total: 0.4 mg/dL (ref 0.0–1.2)
Bilirubin, Direct: 0.13 mg/dL (ref 0.00–0.40)
TOTAL PROTEIN: 6.9 g/dL (ref 6.0–8.5)

## 2017-12-17 LAB — PSA: Prostate Specific Ag, Serum: 0.4 ng/mL (ref 0.0–4.0)

## 2017-12-17 LAB — BASIC METABOLIC PANEL
BUN/Creatinine Ratio: 17 (ref 10–24)
BUN: 17 mg/dL (ref 8–27)
CALCIUM: 9.9 mg/dL (ref 8.6–10.2)
CO2: 21 mmol/L (ref 20–29)
Chloride: 106 mmol/L (ref 96–106)
Creatinine, Ser: 1 mg/dL (ref 0.76–1.27)
GFR calc Af Amer: 90 mL/min/{1.73_m2} (ref >59)
GFR calc non Af Amer: 78 mL/min/{1.73_m2} (ref >59)
GLUCOSE: 97 mg/dL (ref 65–99)
POTASSIUM: 4.8 mmol/L (ref 3.5–5.2)
SODIUM: 143 mmol/L (ref 134–144)

## 2017-12-20 ENCOUNTER — Encounter: Payer: Self-pay | Admitting: Family Medicine

## 2018-03-04 DIAGNOSIS — L570 Actinic keratosis: Secondary | ICD-10-CM | POA: Diagnosis not present

## 2018-03-04 DIAGNOSIS — C4441 Basal cell carcinoma of skin of scalp and neck: Secondary | ICD-10-CM | POA: Diagnosis not present

## 2018-03-04 DIAGNOSIS — C4442 Squamous cell carcinoma of skin of scalp and neck: Secondary | ICD-10-CM | POA: Diagnosis not present

## 2018-03-08 DIAGNOSIS — Z23 Encounter for immunization: Secondary | ICD-10-CM | POA: Diagnosis not present

## 2018-03-18 DIAGNOSIS — C4441 Basal cell carcinoma of skin of scalp and neck: Secondary | ICD-10-CM | POA: Diagnosis not present

## 2018-05-24 ENCOUNTER — Ambulatory Visit (INDEPENDENT_AMBULATORY_CARE_PROVIDER_SITE_OTHER): Payer: Medicare Other | Admitting: Family Medicine

## 2018-05-24 ENCOUNTER — Encounter: Payer: Self-pay | Admitting: Family Medicine

## 2018-05-24 VITALS — BP 132/76 | Ht 70.0 in | Wt 230.4 lb

## 2018-05-24 DIAGNOSIS — Z0000 Encounter for general adult medical examination without abnormal findings: Secondary | ICD-10-CM | POA: Diagnosis not present

## 2018-05-24 DIAGNOSIS — I4819 Other persistent atrial fibrillation: Secondary | ICD-10-CM

## 2018-05-24 DIAGNOSIS — N400 Enlarged prostate without lower urinary tract symptoms: Secondary | ICD-10-CM | POA: Diagnosis not present

## 2018-05-24 DIAGNOSIS — I10 Essential (primary) hypertension: Secondary | ICD-10-CM

## 2018-05-24 DIAGNOSIS — M2550 Pain in unspecified joint: Secondary | ICD-10-CM | POA: Diagnosis not present

## 2018-05-24 DIAGNOSIS — M79641 Pain in right hand: Secondary | ICD-10-CM

## 2018-05-24 DIAGNOSIS — Z79899 Other long term (current) drug therapy: Secondary | ICD-10-CM | POA: Diagnosis not present

## 2018-05-24 DIAGNOSIS — F320 Major depressive disorder, single episode, mild: Secondary | ICD-10-CM | POA: Diagnosis not present

## 2018-05-24 DIAGNOSIS — E785 Hyperlipidemia, unspecified: Secondary | ICD-10-CM | POA: Diagnosis not present

## 2018-05-24 MED ORDER — OMEPRAZOLE 40 MG PO CPDR: DELAYED_RELEASE_CAPSULE | ORAL | 1 refills | Status: DC

## 2018-05-24 MED ORDER — ZOSTER VAC RECOMB ADJUVANTED 50 MCG/0.5ML IM SUSR: 0.5000 mL | Freq: Once | INTRAMUSCULAR | 1 refills | Status: AC

## 2018-05-24 MED ORDER — SIMVASTATIN 20 MG PO TABS: 20.0000 mg | ORAL_TABLET | Freq: Every day | ORAL | 1 refills | Status: DC

## 2018-05-24 MED ORDER — CITALOPRAM HYDROBROMIDE 20 MG PO TABS: 20.0000 mg | ORAL_TABLET | Freq: Every day | ORAL | 1 refills | Status: DC

## 2018-05-24 NOTE — Progress Notes (Signed)
Subjective:    Patient ID: Jon Jackson, male    DOB: 14-Jan-1951, 68 y.o.   MRN: [id number]  HPI The patient comes in today for a wellness visit.    A review of their health history was completed.  A review of medications was also completed.  Any needed refills; yes  Eating habits: trying to eat healthy  Falls/  MVA accidents in past few months: no  Regular exercise: cutting wood  Specialist pt sees on regular basis: Dr Curt Bears  Preventative health issues were discussed.   Additional concerns: none  waking regulalry every morn , walks a lot  chantix caused side effects, messesd with mentl focus and diines    Still smoking tho better than what it was   Results for orders placed or performed in visit on 11/18/17  Lipid Profile  Result Value Ref Range   Cholesterol, Total 175 100 - 199 mg/dL   Triglycerides 118 0 - 149 mg/dL   HDL 49 >39 mg/dL   VLDL Cholesterol Cal 24 5 - 40 mg/dL   LDL Calculated 102 (H) 0 - 99 mg/dL   Chol/HDL Ratio 3.6 0.0 - 5.0 ratio  Hepatic function panel  Result Value Ref Range   Total Protein 6.9 6.0 - 8.5 g/dL   Albumin 4.5 3.6 - 4.8 g/dL   Bilirubin Total 0.4 0.0 - 1.2 mg/dL   Bilirubin, Direct 0.13 0.00 - 0.40 mg/dL   Alkaline Phosphatase 92 39 - 117 IU/L   AST 21 0 - 40 IU/L   ALT 27 0 - 44 IU/L  Basic Metabolic Panel (BMET)  Result Value Ref Range   Glucose 97 65 - 99 mg/dL   BUN 17 8 - 27 mg/dL   Creatinine, Ser 1.00 0.76 - 1.27 mg/dL   GFR calc non Af Amer 78 >59 mL/min/1.73   GFR calc Af Amer 90 >59 mL/min/1.73   BUN/Creatinine Ratio 17 10 - 24   Sodium 143 134 - 144 mmol/L   Potassium 4.8 3.5 - 5.2 mmol/L   Chloride 106 96 - 106 mmol/L   CO2 21 20 - 29 mmol/L   Calcium 9.9 8.6 - 10.2 mg/dL  PSA  Result Value Ref Range   Prostate Specific Ag, Serum 0.4 0.0 - 4.0 ng/mL   Morning   Muscles cramp a fair amnoutnt, lasts for a little while, couple hrs or so, then get sbeter   Both parents had arthritis  Patient  continues to take lipid medication regularly. No obvious side effects from it. Generally does not miss a dose. Prior blood work results are reviewed with patient. Patient continues to work on fat intake in diet  Patient notes ongoing compliance with antidepressant medication. No obvious side effects. Reports does not miss a dose. Overall continues to help depression substantially. No thoughts of homicide or suicide. Would like to maintain medication.  Patient reports progressive pain and achiness.  In the muscles.  In the legs.  Hands.  Morning stiffness for an hour or 2.  Has to get around a fair amount.  No known family history of rheumatoid arthritis.  Has noted swelling progressing in the hands  Diet overall good  Review of Systems  Constitutional: Negative for activity change, appetite change and fever.  HENT: Negative for congestion and rhinorrhea.   Eyes: Negative for discharge.  Respiratory: Negative for cough and wheezing.   Cardiovascular: Negative for chest pain.  Gastrointestinal: Negative for abdominal pain, blood in stool and vomiting.  Genitourinary: Negative  for difficulty urinating and frequency.  Musculoskeletal: Negative for neck pain.  Skin: Negative for rash.  Allergic/Immunologic: Negative for environmental allergies and food allergies.  Neurological: Negative for weakness and headaches.  Psychiatric/Behavioral: Negative for agitation.  All other systems reviewed and are negative.      Objective:   Physical Exam Vitals signs reviewed.  Constitutional:      Appearance: He is well-developed.  HENT:     Head: Normocephalic and atraumatic.     Right Ear: External ear normal.     Left Ear: External ear normal.     Nose: Nose normal.  Eyes:     Pupils: Pupils are equal, round, and reactive to light.  Neck:     Musculoskeletal: Normal range of motion and neck supple.     Thyroid: No thyromegaly.  Cardiovascular:     Rate and Rhythm: Normal rate and regular  rhythm.     Heart sounds: Normal heart sounds. No murmur.  Pulmonary:     Effort: Pulmonary effort is normal. No respiratory distress.     Breath sounds: Normal breath sounds. No wheezing.  Abdominal:     General: Bowel sounds are normal. There is no distension.     Palpations: Abdomen is soft. There is no mass.     Tenderness: There is no abdominal tenderness.  Genitourinary:    Penis: Normal.   Musculoskeletal: Normal range of motion.     Comments: Metacarpal phalangeal swelling evident.  Distal Heberden's nodes evident in both hands grip intact  Lymphadenopathy:     Cervical: No cervical adenopathy.  Skin:    General: Skin is warm and dry.     Findings: No erythema.  Neurological:     Mental Status: He is alert.     Motor: No abnormal muscle tone.  Psychiatric:        Behavior: Behavior normal.        Judgment: Judgment normal.           Assessment & Plan:  Impression 1 progressive morning stiffness.  Metacarpal phalangeal swelling bilateral x-ray.  Arthralgias muscle joint tenderness.  Further blood work and x-rays recommended for sure.  Sed rate rheumatoid factor CPK  2 depression stable discussed to maintain same medication.  Diet exercise discussed.  3.  Hyperlipidemia.Current status uncertain.  Appropriate blood work discussed  4. Smoking cessation disc. Could not tolerate chantix. Ct screening disc pt declines  Wellness.  Discussed.  Shingles vaccincne encouraged, pt to  diet exercise discussed  Follow-up in 6 months further recommendations based on blood

## 2018-05-25 ENCOUNTER — Ambulatory Visit (HOSPITAL_COMMUNITY)
Admission: RE | Admit: 2018-05-25 | Discharge: 2018-05-25 | Disposition: A | Discharge status: Home / Self Care | Payer: Medicare Other | Source: Ambulatory Visit | Attending: Family Medicine | Admitting: Family Medicine

## 2018-05-25 DIAGNOSIS — M2550 Pain in unspecified joint: Secondary | ICD-10-CM | POA: Diagnosis not present

## 2018-05-25 DIAGNOSIS — M19031 Primary osteoarthritis, right wrist: Secondary | ICD-10-CM | POA: Diagnosis not present

## 2018-05-25 DIAGNOSIS — M79641 Pain in right hand: Secondary | ICD-10-CM | POA: Diagnosis not present

## 2018-05-25 DIAGNOSIS — E785 Hyperlipidemia, unspecified: Secondary | ICD-10-CM | POA: Diagnosis not present

## 2018-05-25 DIAGNOSIS — I10 Essential (primary) hypertension: Secondary | ICD-10-CM | POA: Diagnosis not present

## 2018-05-26 LAB — LIPID PANEL
CHOLESTEROL TOTAL: 201 mg/dL — AB (ref 100–199)
Chol/HDL Ratio: 4.9 ratio (ref 0.0–5.0)
HDL: 41 mg/dL (ref >39)
LDL CALC: 137 mg/dL — AB (ref 0–99)
TRIGLYCERIDES: 113 mg/dL (ref 0–149)
VLDL Cholesterol Cal: 23 mg/dL (ref 5–40)

## 2018-05-26 LAB — HEPATIC FUNCTION PANEL
ALBUMIN: 4.3 g/dL (ref 3.8–4.8)
ALT: 23 IU/L (ref 0–44)
AST: 20 IU/L (ref 0–40)
Alkaline Phosphatase: 88 IU/L (ref 39–117)
BILIRUBIN TOTAL: 0.5 mg/dL (ref 0.0–1.2)
BILIRUBIN, DIRECT: 0.12 mg/dL (ref 0.00–0.40)
Total Protein: 6.3 g/dL (ref 6.0–8.5)

## 2018-05-26 LAB — RHEUMATOID FACTOR

## 2018-05-26 LAB — SEDIMENTATION RATE: Sed Rate: 2 mm/hr (ref 0–30)

## 2018-05-26 LAB — CK: Total CK: 234 U/L — ABNORMAL HIGH (ref 24–204)

## 2018-06-10 ENCOUNTER — Encounter: Payer: Self-pay | Admitting: Family Medicine

## 2018-06-11 ENCOUNTER — Other Ambulatory Visit: Payer: Self-pay | Admitting: Family Medicine

## 2018-06-11 DIAGNOSIS — Z79899 Other long term (current) drug therapy: Secondary | ICD-10-CM

## 2018-06-11 DIAGNOSIS — E785 Hyperlipidemia, unspecified: Secondary | ICD-10-CM

## 2018-07-05 ENCOUNTER — Other Ambulatory Visit: Payer: Self-pay | Admitting: Cardiology

## 2018-09-06 DIAGNOSIS — D485 Neoplasm of uncertain behavior of skin: Secondary | ICD-10-CM | POA: Diagnosis not present

## 2018-09-06 DIAGNOSIS — L570 Actinic keratosis: Secondary | ICD-10-CM | POA: Diagnosis not present

## 2018-09-06 DIAGNOSIS — L820 Inflamed seborrheic keratosis: Secondary | ICD-10-CM | POA: Diagnosis not present

## 2018-09-06 DIAGNOSIS — L821 Other seborrheic keratosis: Secondary | ICD-10-CM | POA: Diagnosis not present

## 2018-09-06 DIAGNOSIS — Z85828 Personal history of other malignant neoplasm of skin: Secondary | ICD-10-CM | POA: Diagnosis not present

## 2018-09-10 ENCOUNTER — Ambulatory Visit (INDEPENDENT_AMBULATORY_CARE_PROVIDER_SITE_OTHER): Payer: Medicare Other | Admitting: Family Medicine

## 2018-09-10 ENCOUNTER — Other Ambulatory Visit: Payer: Self-pay

## 2018-09-10 ENCOUNTER — Encounter: Payer: Self-pay | Admitting: Family Medicine

## 2018-09-10 DIAGNOSIS — M79604 Pain in right leg: Secondary | ICD-10-CM

## 2018-09-10 DIAGNOSIS — M79605 Pain in left leg: Secondary | ICD-10-CM

## 2018-09-10 MED ORDER — ETODOLAC 400 MG PO TABS: ORAL_TABLET | ORAL | 0 refills | Status: DC

## 2018-09-10 MED ORDER — HYDROCODONE-ACETAMINOPHEN 7.5-325 MG PO TABS: ORAL_TABLET | ORAL | 0 refills | Status: DC

## 2018-09-10 NOTE — Progress Notes (Signed)
   Subjective:    Patient ID: Jon Jackson, male    DOB: 1950-09-05, 68 y.o.   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 Pain for about one month. Tried ibuprofen, goody powder, tylenol. Pain is keeping him from sleeping.   Virtual Visit via Video Note  I connected with Luz Lex on 09/10/18 at  2:30 PM EDT by a video enabled telemedicine application and verified that I am speaking with the correct person using two identifiers.  Location: Patient: home Provider: office   I discussed the limitations of evaluation and management by telemedicine and the availability of in person appointments. The patient expressed understanding and agreed to proceed.  History of Present Illness:    Observations/Objective:   Assessment and Plan:   Follow Up Instructions:    I discussed the assessment and treatment plan with the patient. The patient was provided an opportunity to ask questions and all were answered. The patient agreed with the plan and demonstrated an understanding of the instructions.   The patient was advised to call back or seek an in-person evaluation if the symptoms worsen or if the condition fails to improve as anticipated.  I provided 18 minutes of non-face-to-face time during this encounter.   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  Stretch both hamstrings now painful tenderness over.  Worse at nighttime.  Has been using ibuprofen for inflammation she also needs to be on something stronger.  He is on Eliquis also but handling ibuprofen without problem also much stronger pain pill at night   Review of Systems No headache, no major weight loss or weight gain, no chest pain no back pain abdominal pain no change in bowel habits complete ROS otherwise negative     Objective:   Physical Exam   Virtual     Assessment & Plan:  Impression musculoskeletal hamstring strain plan  anti-inflammatory use as needed.  Nighttime pain medicine.  Expect gradual resolution

## 2018-09-23 ENCOUNTER — Encounter: Payer: Self-pay | Admitting: Family Medicine

## 2018-09-23 NOTE — Telephone Encounter (Signed)
Certainly it is possible this could be an impingement of a nerve causing this problem Dr. Herma Mering and his group does spinal injections We can go ahead and initiate referral Please keep appointment with Dr. Richardson Landry on July 1 Dr. Richardson Landry May be ordering other medications and possibility of imaging as well

## 2018-09-24 ENCOUNTER — Other Ambulatory Visit: Payer: Self-pay | Admitting: *Deleted

## 2018-09-24 DIAGNOSIS — M549 Dorsalgia, unspecified: Secondary | ICD-10-CM

## 2018-09-24 NOTE — Progress Notes (Signed)
a 

## 2018-09-28 ENCOUNTER — Encounter: Payer: Self-pay | Admitting: Family Medicine

## 2018-09-29 ENCOUNTER — Other Ambulatory Visit: Payer: Self-pay

## 2018-09-29 ENCOUNTER — Ambulatory Visit (INDEPENDENT_AMBULATORY_CARE_PROVIDER_SITE_OTHER): Payer: Medicare Other | Admitting: Family Medicine

## 2018-09-29 DIAGNOSIS — M79604 Pain in right leg: Secondary | ICD-10-CM

## 2018-09-29 DIAGNOSIS — M79605 Pain in left leg: Secondary | ICD-10-CM

## 2018-09-29 DIAGNOSIS — M549 Dorsalgia, unspecified: Secondary | ICD-10-CM

## 2018-09-29 MED ORDER — PREDNISONE 20 MG PO TABS: ORAL_TABLET | ORAL | 0 refills | Status: DC

## 2018-09-29 NOTE — Progress Notes (Signed)
   Subjective:  Audio plus video  Patient ID: Jon Jackson, male    DOB: [birth year], 68 y.o.   MRN: [id number]  Back Pain This is a new problem. The current episode started more than 1 month ago. Progression since onset: pt states the pain was really bad last week but is better this week  Radiates to: left and right leg but left leg mostly. (Pt states at times his left leg has felt like it has wanted to buckle) Treatments tried: Provider has prescribed pain pills in the past. The treatment provided no relief.   Virtual Visit via Video Note  I connected with Jon Jackson on 09/29/18 at 11:00 AM EDT by a video enabled telemedicine application and verified that I am speaking with the correct person using two identifiers.  Location: Patient: home Provider: office   I discussed the limitations of evaluation and management by telemedicine and the availability of in person appointments. The patient expressed understanding and agreed to proceed.  History of Present Illness:    Observations/Objective:   Assessment and Plan:   Follow Up Instructions:    I discussed the assessment and treatment plan with the patient. The patient was provided an opportunity to ask questions and all were answered. The patient agreed with the plan and demonstrated an understanding of the instructions.   The patient was advised to call back or seek an in-person evaluation if the symptoms worsen or if the condition fails to improve as anticipated.  I provided 20 minutes of non-face-to-face time during this encounter.   Vicente Males, LPN    Review of Systems  Musculoskeletal: Positive for back pain.       Objective:   Physical Exam  Virtual      Assessment & Plan:  Impression ongoing impressive pain.  MRI in the past revealed an element of spinal stenosis.  His pain is deep low in the back and radiates into both legs and knees.  Worse when standing.  Certainly sounds like a flare of spinal  stenosis.  Discussed.  Will do prednisone taper.  Also PRN pain medication if persists a couple more weeks will need to consider reimaging

## 2018-09-30 MED ORDER — HYDROCODONE-ACETAMINOPHEN 7.5-300 MG PO TABS: ORAL_TABLET | ORAL | 0 refills | Status: DC

## 2018-10-11 ENCOUNTER — Encounter: Payer: Self-pay | Admitting: Family Medicine

## 2018-11-02 ENCOUNTER — Telehealth: Payer: Self-pay | Admitting: Family Medicine

## 2018-11-02 ENCOUNTER — Telehealth: Payer: Self-pay

## 2018-11-02 ENCOUNTER — Other Ambulatory Visit: Payer: Self-pay | Admitting: Orthopedic Surgery

## 2018-11-02 DIAGNOSIS — M545 Low back pain, unspecified: Secondary | ICD-10-CM

## 2018-11-02 DIAGNOSIS — G8929 Other chronic pain: Secondary | ICD-10-CM

## 2018-11-02 NOTE — Telephone Encounter (Signed)
Spoke with patient to screen his medications before being scheduled for a lumbar myelogram.  He was informed he will be here two hours, will need a driver (who is not allowed in the building at this time) and will need to be on strict bedrest for 24 hours after the procedure.  He was informed he will need to hold his Citalopram for 48 hours before, and 24 hours after, the procedure.  He was also told to hold Goody's Powders for three days before the procedure.  [place] clearance for Eliquis was faxed.  He was informed that once we get clearance for him to hold this medication for two days, our scheduler Angelita Ingles) will call him to get him scheduled.  He will be able to resume Eliquis and Goody's Powders immediately after the procedure.

## 2018-11-02 NOTE — Telephone Encounter (Signed)
Please see medical clearance request from Dr. Gladstone Lighter & advise  In yellow box on wall

## 2018-11-03 ENCOUNTER — Telehealth: Payer: Self-pay | Admitting: Cardiology

## 2018-11-03 NOTE — Telephone Encounter (Signed)
Follow UP:    Rae Mar is calling to check on the status of clearance that was faxed over yesterday. Please fax this asap to [phone]

## 2018-11-03 NOTE — Telephone Encounter (Signed)
   [facility] Pre-operative Risk Assessment    Request for surgical clearance:  1. What type of surgery is being performed? Myelogram Procedure   2. When is this surgery scheduled? TBD   3. What type of clearance is required (medical clearance vs. Pharmacy clearance to hold med vs. Both)? Pharmacy  4. Are there any medications that need to be held prior to surgery and how long? Hold Eliquis 2 days prior to procedure   5. Practice name and name of physician performing surgery?  [facility]   6. What is your office phone number [phone]    7.   What is your office fax number [phone]  8.   Anesthesia type (None, local, MAC, general) ? Not listed   Jon Jackson 11/03/2018, 4:12 PM  _________________________________________________________________   (provider comments below)

## 2018-11-04 NOTE — Telephone Encounter (Signed)
Pt takes Eliquis for afib with CHADS2VASc score of 2 (age, HTN). Renal function is normal. Recommend holding Eliquis for 3 days prior to myelogram.

## 2018-11-04 NOTE — Telephone Encounter (Signed)
   Primary Cardiologist: Will Meredith Leeds, MD  Chart reviewed as part of pre-operative protocol coverage. Per pharmacy recommendations, patient can hold eliquis for 3 days prior to her upcoming myelogram. She should restart this medication when cleared to do so by her surgeon.   I will route this recommendation to the requesting party via Epic fax function and remove from pre-op pool.  Please call with questions.  Abigail Butts, PA-C 11/04/2018, 9:27 AM

## 2018-11-09 NOTE — Telephone Encounter (Signed)
Faxed signed clearance to [facility]

## 2018-11-15 NOTE — Discharge Instructions (Signed)
Myelogram Discharge Instructions  1. Go home and rest quietly for the next 24 hours.  It is important to lie flat for the next 24 hours.  Get up only to go to the restroom.  You may lie in the bed or on a couch on your back, your stomach, your left side or your right side.  You may have one pillow under your head.  You may have pillows between your knees while you are on your side or under your knees while you are on your back.  2. DO NOT drive today.  Recline the seat as far back as it will go, while still wearing your seat belt, on the way home.  3. You may get up to go to the bathroom as needed.  You may sit up for 10 minutes to eat.  You may resume your normal diet and medications unless otherwise indicated.  Drink lots of extra fluids today and tomorrow.  4. The incidence of headache, nausea, or vomiting is about 5% (one in 20 patients).  If you develop a headache, lie flat and drink plenty of fluids until the headache goes away.  Caffeinated beverages may be helpful.  If you develop severe nausea and vomiting or a headache that does not go away with flat bed rest, call [phone].  5. You may resume normal activities after your 24 hours of bed rest is over; however, do not exert yourself strongly or do any heavy lifting tomorrow. If when you get up you have a headache when standing, go back to bed and force fluids for another 24 hours.  6. Call your physician for a follow-up appointment.  The results of your myelogram will be sent directly to your physician by the following day.  7. If you have any questions or if complications develop after you arrive home, please call [phone].  Discharge instructions have been explained to the patient.  The patient, or the person responsible for the patient, fully understands these instructions.  YOU MAY RESTART YOUR ELIQUIS TODAY. YOU MAY RESTART YOUR GOODY POWDERS AS NEEDED TODAY. YOU MAY RESTART YOUR CITALOPRAM TOMORROW 11/17/2018 AT 09:30AM.

## 2018-11-16 ENCOUNTER — Ambulatory Visit
Admission: RE | Admit: 2018-11-16 | Discharge: 2018-11-16 | Disposition: A | Discharge status: Home / Self Care | Payer: Medicare Other | Source: Ambulatory Visit | Attending: Orthopedic Surgery | Admitting: Orthopedic Surgery

## 2018-11-16 DIAGNOSIS — M545 Low back pain, unspecified: Secondary | ICD-10-CM

## 2018-11-16 DIAGNOSIS — M4316 Spondylolisthesis, lumbar region: Secondary | ICD-10-CM | POA: Diagnosis not present

## 2018-11-16 DIAGNOSIS — G8929 Other chronic pain: Secondary | ICD-10-CM

## 2018-11-16 DIAGNOSIS — M5126 Other intervertebral disc displacement, lumbar region: Secondary | ICD-10-CM | POA: Diagnosis not present

## 2018-11-16 DIAGNOSIS — M48061 Spinal stenosis, lumbar region without neurogenic claudication: Secondary | ICD-10-CM | POA: Diagnosis not present

## 2018-11-16 MED ORDER — DIAZEPAM 5 MG PO TABS
5.0000 mg | ORAL_TABLET | Freq: Once | ORAL | Status: AC
  Administered 2018-11-16: 5 mg via ORAL

## 2018-11-16 MED ORDER — ONDANSETRON HCL 4 MG/2ML IJ SOLN
4.0000 mg | Freq: Once | INTRAMUSCULAR | Status: AC
  Administered 2018-11-16: 4 mg via INTRAMUSCULAR

## 2018-11-16 MED ORDER — MEPERIDINE HCL 100 MG/ML IJ SOLN
50.0000 mg | Freq: Once | INTRAMUSCULAR | Status: AC
  Administered 2018-11-16: 10:00:00 50 mg via INTRAMUSCULAR

## 2018-11-16 MED ORDER — IOPAMIDOL (ISOVUE-M 200) INJECTION 41%
20.0000 mL | Freq: Once | INTRAMUSCULAR | Status: AC
  Administered 2018-11-16: 10:00:00 20 mL via INTRATHECAL

## 2018-11-18 DIAGNOSIS — E785 Hyperlipidemia, unspecified: Secondary | ICD-10-CM | POA: Diagnosis not present

## 2018-11-18 DIAGNOSIS — Z79899 Other long term (current) drug therapy: Secondary | ICD-10-CM | POA: Diagnosis not present

## 2018-11-19 LAB — LIPID PANEL
Chol/HDL Ratio: 4.3 ratio (ref 0.0–5.0)
Cholesterol, Total: 202 mg/dL — ABNORMAL HIGH (ref 100–199)
HDL: 47 mg/dL (ref >39)
LDL Calculated: 135 mg/dL — ABNORMAL HIGH (ref 0–99)
Triglycerides: 98 mg/dL (ref 0–149)
VLDL Cholesterol Cal: 20 mg/dL (ref 5–40)

## 2018-11-19 LAB — CK: Total CK: 237 U/L (ref 41–331)

## 2018-11-22 ENCOUNTER — Encounter: Payer: Self-pay | Admitting: Family Medicine

## 2018-11-22 ENCOUNTER — Other Ambulatory Visit: Payer: Self-pay

## 2018-11-22 ENCOUNTER — Ambulatory Visit (INDEPENDENT_AMBULATORY_CARE_PROVIDER_SITE_OTHER): Payer: Medicare Other | Admitting: Family Medicine

## 2018-11-22 DIAGNOSIS — F320 Major depressive disorder, single episode, mild: Secondary | ICD-10-CM | POA: Diagnosis not present

## 2018-11-22 DIAGNOSIS — M549 Dorsalgia, unspecified: Secondary | ICD-10-CM | POA: Diagnosis not present

## 2018-11-22 DIAGNOSIS — N400 Enlarged prostate without lower urinary tract symptoms: Secondary | ICD-10-CM

## 2018-11-22 DIAGNOSIS — I10 Essential (primary) hypertension: Secondary | ICD-10-CM

## 2018-11-22 DIAGNOSIS — E785 Hyperlipidemia, unspecified: Secondary | ICD-10-CM

## 2018-11-22 MED ORDER — CITALOPRAM HYDROBROMIDE 20 MG PO TABS: 20.0000 mg | ORAL_TABLET | Freq: Every day | ORAL | 1 refills | Status: DC

## 2018-11-22 MED ORDER — SIMVASTATIN 20 MG PO TABS: 20.0000 mg | ORAL_TABLET | Freq: Every day | ORAL | 1 refills | Status: DC

## 2018-11-22 NOTE — Progress Notes (Signed)
   Subjective:    Patient ID: Jon Jackson, male    DOB: [birth year], 68 y.o.   MRN: [id number]  Hypertension This is a chronic problem. There are no compliance problems.   pt states he does not check blood pressure on regular basis. Pt states he is not having any issues with blood pressure.  Virtual Visit via Video Note  I connected with Jon Jackson on 11/22/18 at  2:00 PM EDT by a video enabled telemedicine application and verified that I am speaking with the correct person using two identifiers.  Location: Patient: home Provider: office   I discussed the limitations of evaluation and management by telemedicine and the availability of in person appointments. The patient expressed understanding and agreed to proceed.  History of Present Illness:    Observations/Objective:   Assessment and Plan:   Follow Up Instructions:    I discussed the assessment and treatment plan with the patient. The patient was provided an opportunity to ask questions and all were answered. The patient agreed with the plan and demonstrated an understanding of the instructions.   The patient was advised to call back or seek an in-person evaluation if the symptoms worsen or if the condition fails to improve as anticipated.  I provided 70minutes of non-face-to-face time during this encounter. Jon Jackson states his mood medication definitely helps him and he definitely would like to stay on it.  Completely compliant Patient notes ongoing compliance with antidepressant medication. No obvious side effects. Reports does not miss a dose. Overall continues to help depression substantially. No thoughts of homicide or suicide. Would like to maintain medication.   Prior blood work reviewed with Jon Jackson.  Lipid panel overall in good control.  States his diet is decent but not great Patient continues to take lipid medication regularly. No obvious side effects from it. Generally does not miss a dose. Prior blood  work results are reviewed with patient. Patient continues to work on fat intake in diet  Patient is due to have intervention on his spinal stenosis.  Fairly severe at this time.  He is been told surgery.  This will be occurring soon.  Unfortunately not exercising much now.  Jon Males, LPN    Review of Systems No headache, no major weight loss or weight gain, no chest pain  abdominal pain no change in bowel habits complete ROS otherwise negative     Objective:   Physical Exam  Virtual visit      Assessment & Plan:  Impression hyperlipidemia.  Diet discussed.  Exercise discussed medications refilled.  Blood work encouraged  2.  Depression clinically stable exercise encouraged compliance discussed medications refilled  3.  Spinal stenosis.  Patient had questions about this these are answered likely facing surgery soon  4.  Coronavirus concerns discussed  Greater than 50% of this 25 minute face to face visit was spent in counseling and discussion and coordination of care regarding the above diagnosis/diagnosies

## 2018-11-23 DIAGNOSIS — M545 Low back pain: Secondary | ICD-10-CM | POA: Diagnosis not present

## 2018-11-23 DIAGNOSIS — M48061 Spinal stenosis, lumbar region without neurogenic claudication: Secondary | ICD-10-CM | POA: Diagnosis not present

## 2018-11-26 ENCOUNTER — Telehealth: Payer: Self-pay | Admitting: *Deleted

## 2018-11-26 NOTE — Telephone Encounter (Signed)
   [facility] Pre-operative Risk Assessment    Request for surgical clearance:  1. What type of surgery is being performed? LUMBAR DECOMPRESSION   2. When is this surgery scheduled?  12-01-2018   3. What type of clearance is required (medical clearance vs. Pharmacy clearance to hold med vs. Both)?  PHARMACY  4. Are there any medications that need to be held prior to surgery and how long? ELIQUIS 5 DAYS  5. Practice name and name of physician performing surgery?  [facility] / DR. GIOFFRE   6. What is your office phone number [phone]    7.   What is your office fax number [phone]  8.   Anesthesia type (None, local, MAC, general) ?  GENERAL   Jeanann Lewandowsky 11/26/2018, 4:00 PM  _________________________________________________________________   (provider comments below)

## 2018-11-26 NOTE — Patient Instructions (Addendum)
DUE TO COVID-19 ONLY ONE VISITOR IS ALLOWED TO COME WITH YOU AND STAY IN THE WAITING ROOM ONLY DURING PRE OP AND PROCEDURE. THE ONE VISITOR MAY VISIT WITH YOU IN YOUR PRIVATE ROOM DURING VISITING HOURS ONLY!!   COVID SWAB TESTING COMPLETED ON: Aug. 29, 2020 (Must self quarantine after testing. Follow instructions on handout.)             Your procedure is scheduled on: Wednesday, Sept. 2, 2020   Report to [facility] Main  Entrance    Report to admitting at 9:15 AM   Call this number if you have problems the morning of surgery [phone]   Do not eat food:After Midnight.   May have liquids until 8:15AM day of surgery   CLEAR LIQUID DIET  Foods Allowed                                                                     Foods Excluded  Water, Black Coffee and tea, regular and decaf                             liquids that you cannot  Plain Jell-O in any flavor  (No red)                                           see through such as: Fruit ices (not with fruit pulp)                                     milk, soups, orange juice  Iced Popsicles (No red)                                    All solid food Carbonated beverages, regular and diet                                    Apple juices Sports drinks like Gatorade (No red) Lightly seasoned clear broth or consume(fat free) Sugar, honey syrup  Sample Menu Breakfast                                Lunch                                     Supper Cranberry juice                    Beef broth                            Chicken broth Jell-O  Grape juice                           Apple juice Coffee or tea                        Jell-O                                      Popsicle                                                Coffee or tea                        Coffee or tea   Complete one Ensure drink the morning of surgery at 8:15AM the day of surgery.   Brush your teeth the morning of  surgery.   Do NOT smoke after Midnight   Take these medicines the morning of surgery with A SIP OF WATER: Xyzal              You may not have any metal on your body including jewelry, and body piercings             Do not wear lotions, powders, perfumes/cologne, or deodorant                          Men may shave face and neck.   Do not bring valuables to the hospital. [place].   Contacts, dentures or bridgework may not be worn into surgery.   Bring small overnight bag day of surgery.   Special Instructions: Bring a copy of your healthcare power of attorney and living will documents         the day of surgery if you haven't scanned them in before.              Please read over the following fact sheets you were given:  [facility] - Preparing for Surgery Before surgery, you can play an important role.  Because skin is not sterile, your skin needs to be as free of germs as possible.  You can reduce the number of germs on your skin by washing with CHG (chlorahexidine gluconate) soap before surgery.  CHG is an antiseptic cleaner which kills germs and bonds with the skin to continue killing germs even after washing. Please DO NOT use if you have an allergy to CHG or antibacterial soaps.  If your skin becomes reddened/irritated stop using the CHG and inform your nurse when you arrive at Short Stay. Do not shave (including legs and underarms) for at least 48 hours prior to the first CHG shower.  You may shave your face/neck.  Please follow these instructions carefully:  1.  Shower with CHG Soap the night before surgery and the  morning of surgery.  2.  If you choose to wash your hair, wash your hair first as usual with your normal  shampoo.  3.  After you shampoo, rinse your hair and body thoroughly to remove the shampoo.  4.  Use CHG as you would any other liquid soap.  You can apply chg directly to the skin and  wash.  Gently with a scrungie or clean washcloth.  5.  Apply the CHG Soap to your body ONLY FROM THE NECK DOWN.   Do   not use on face/ open                           Wound or open sores. Avoid contact with eyes, ears mouth and   genitals (private parts).                       Wash face,  Genitals (private parts) with your normal soap.             6.  Wash thoroughly, paying special attention to the area where your    surgery  will be performed.  7.  Thoroughly rinse your body with warm water from the neck down.  8.  DO NOT shower/wash with your normal soap after using and rinsing off the CHG Soap.                9.  Pat yourself dry with a clean towel.            10.  Wear clean pajamas.            11.  Place clean sheets on your bed the night of your first shower and do not  sleep with pets. Day of Surgery : Do not apply any lotions/deodorants the morning of surgery.  Please wear clean clothes to the hospital/surgery center.  FAILURE TO FOLLOW THESE INSTRUCTIONS MAY RESULT IN THE CANCELLATION OF YOUR SURGERY  PATIENT SIGNATURE_________________________________  NURSE SIGNATURE__________________________________  ________________________________________________________________________   Adam Phenix  An incentive spirometer is a tool that can help keep your lungs clear and active. This tool measures how well you are filling your lungs with each breath. Taking long deep breaths may help reverse or decrease the chance of developing breathing (pulmonary) problems (especially infection) following:  A long period of time when you are unable to move or be active. BEFORE THE PROCEDURE   If the spirometer includes an indicator to show your best effort, your nurse or respiratory therapist will set it to a desired goal.  If possible, sit up straight or lean slightly forward. Try not to slouch.  Hold the incentive spirometer in an upright position. INSTRUCTIONS FOR USE  1. Sit on the  edge of your bed if possible, or sit up as far as you can in bed or on a chair. 2. Hold the incentive spirometer in an upright position. 3. Breathe out normally. 4. Place the mouthpiece in your mouth and seal your lips tightly around it. 5. Breathe in slowly and as deeply as possible, raising the piston or the ball toward the top of the column. 6. Hold your breath for 3-5 seconds or for as long as possible. Allow the piston or ball to fall to the bottom of the column. 7. Remove the mouthpiece from your mouth and breathe out normally. 8. Rest for a few seconds and repeat Steps 1 through 7 at least 10 times every 1-2 hours when you are awake. Take your time and take a few normal breaths between deep breaths. 9. The spirometer may include an indicator to show your best effort. Use the indicator as a goal to work toward during each  repetition. 10. After each set of 10 deep breaths, practice coughing to be sure your lungs are clear. If you have an incision (the cut made at the time of surgery), support your incision when coughing by placing a pillow or rolled up towels firmly against it. Once you are able to get out of bed, walk around indoors and cough well. You may stop using the incentive spirometer when instructed by your caregiver.  RISKS AND COMPLICATIONS  Take your time so you do not get dizzy or light-headed.  If you are in pain, you may need to take or ask for pain medication before doing incentive spirometry. It is harder to take a deep breath if you are having pain. AFTER USE  Rest and breathe slowly and easily.  It can be helpful to keep track of a log of your progress. Your caregiver can provide you with a simple table to help with this. If you are using the spirometer at home, follow these instructions: [place] IF:   You are having difficultly using the spirometer.  You have trouble using the spirometer as often as instructed.  Your pain medication is not giving enough  relief while using the spirometer.  You develop fever of 100.5 F (38.1 C) or higher. SEEK IMMEDIATE MEDICAL CARE IF:   You cough up bloody sputum that had not been present before.  You develop fever of 102 F (38.9 C) or greater.  You develop worsening pain at or near the incision site. MAKE SURE YOU:   Understand these instructions.  Will watch your condition.  Will get help right away if you are not doing well or get worse. Document Released: 07/28/2006 Document Revised: 06/09/2011 Document Reviewed: 09/28/2006 [facility] Patient Information 2014 [place], [place].   ________________________________________________________________________

## 2018-11-27 ENCOUNTER — Other Ambulatory Visit (HOSPITAL_COMMUNITY)
Admission: RE | Admit: 2018-11-27 | Discharge: 2018-11-27 | Disposition: A | Discharge status: Home / Self Care | Payer: Medicare Other | Source: Ambulatory Visit | Attending: Orthopedic Surgery | Admitting: Orthopedic Surgery

## 2018-11-27 DIAGNOSIS — Z20828 Contact with and (suspected) exposure to other viral communicable diseases: Secondary | ICD-10-CM | POA: Diagnosis not present

## 2018-11-27 DIAGNOSIS — Z01812 Encounter for preprocedural laboratory examination: Secondary | ICD-10-CM | POA: Diagnosis not present

## 2018-11-27 LAB — SARS CORONAVIRUS 2 (TAT 6-24 HRS): SARS Coronavirus 2: NEGATIVE

## 2018-11-28 NOTE — Telephone Encounter (Signed)
Clinical pharmacist to review. Note, surgery is on 9/2. Also this request is for pharmacy clearance only, not medical clearance. Will need to verify this with requesting provider as lumbar decompression is clearly higher risk procedure than the myelogram we cleared the patient for in the past several month and this patient has not been seen since Aug 2019.

## 2018-11-29 ENCOUNTER — Other Ambulatory Visit: Payer: Self-pay

## 2018-11-29 ENCOUNTER — Ambulatory Visit (HOSPITAL_COMMUNITY)
Admission: RE | Admit: 2018-11-29 | Discharge: 2018-11-29 | Disposition: A | Discharge status: Home / Self Care | Payer: Medicare Other | Source: Ambulatory Visit | Attending: Surgical | Admitting: Surgical

## 2018-11-29 ENCOUNTER — Encounter (HOSPITAL_COMMUNITY)
Admission: RE | Admit: 2018-11-29 | Discharge: 2018-11-29 | Disposition: A | Discharge status: Home / Self Care | Payer: Medicare Other | Source: Ambulatory Visit | Attending: Orthopedic Surgery | Admitting: Orthopedic Surgery

## 2018-11-29 ENCOUNTER — Encounter (HOSPITAL_COMMUNITY): Payer: Self-pay

## 2018-11-29 DIAGNOSIS — M48061 Spinal stenosis, lumbar region without neurogenic claudication: Secondary | ICD-10-CM | POA: Diagnosis not present

## 2018-11-29 DIAGNOSIS — Z01818 Encounter for other preprocedural examination: Secondary | ICD-10-CM | POA: Insufficient documentation

## 2018-11-29 DIAGNOSIS — F329 Major depressive disorder, single episode, unspecified: Secondary | ICD-10-CM | POA: Insufficient documentation

## 2018-11-29 DIAGNOSIS — Z79899 Other long term (current) drug therapy: Secondary | ICD-10-CM | POA: Insufficient documentation

## 2018-11-29 DIAGNOSIS — K219 Gastro-esophageal reflux disease without esophagitis: Secondary | ICD-10-CM | POA: Diagnosis not present

## 2018-11-29 DIAGNOSIS — Z85828 Personal history of other malignant neoplasm of skin: Secondary | ICD-10-CM | POA: Diagnosis not present

## 2018-11-29 DIAGNOSIS — F419 Anxiety disorder, unspecified: Secondary | ICD-10-CM | POA: Diagnosis not present

## 2018-11-29 DIAGNOSIS — Z7901 Long term (current) use of anticoagulants: Secondary | ICD-10-CM | POA: Insufficient documentation

## 2018-11-29 DIAGNOSIS — M545 Low back pain, unspecified: Secondary | ICD-10-CM

## 2018-11-29 DIAGNOSIS — I4891 Unspecified atrial fibrillation: Secondary | ICD-10-CM | POA: Insufficient documentation

## 2018-11-29 DIAGNOSIS — F17210 Nicotine dependence, cigarettes, uncomplicated: Secondary | ICD-10-CM | POA: Insufficient documentation

## 2018-11-29 DIAGNOSIS — E785 Hyperlipidemia, unspecified: Secondary | ICD-10-CM | POA: Diagnosis not present

## 2018-11-29 LAB — CBC WITH DIFFERENTIAL/PLATELET
Abs Immature Granulocytes: 0.02 10*3/uL (ref 0.00–0.07)
Basophils Absolute: 0 10*3/uL (ref 0.0–0.1)
Basophils Relative: 1 %
Eosinophils Absolute: 0.2 10*3/uL (ref 0.0–0.5)
Eosinophils Relative: 4 %
HCT: 45.9 % (ref 39.0–52.0)
Hemoglobin: 15 g/dL (ref 13.0–17.0)
Immature Granulocytes: 0 %
Lymphocytes Relative: 15 %
Lymphs Abs: 0.9 10*3/uL (ref 0.7–4.0)
MCH: 31.8 pg (ref 26.0–34.0)
MCHC: 32.7 g/dL (ref 30.0–36.0)
MCV: 97.2 fL (ref 80.0–100.0)
Monocytes Absolute: 0.6 10*3/uL (ref 0.1–1.0)
Monocytes Relative: 10 %
Neutro Abs: 4 10*3/uL (ref 1.7–7.7)
Neutrophils Relative %: 70 %
Platelets: 205 10*3/uL (ref 150–400)
RBC: 4.72 MIL/uL (ref 4.22–5.81)
RDW: 12.1 % (ref 11.5–15.5)
WBC: 5.7 10*3/uL (ref 4.0–10.5)
nRBC: 0 % (ref 0.0–0.2)

## 2018-11-29 LAB — COMPREHENSIVE METABOLIC PANEL
ALT: 32 U/L (ref 0–44)
AST: 29 U/L (ref 15–41)
Albumin: 4.4 g/dL (ref 3.5–5.0)
Alkaline Phosphatase: 90 U/L (ref 38–126)
Anion gap: 8 (ref 5–15)
BUN: 19 mg/dL (ref 8–23)
CO2: 27 mmol/L (ref 22–32)
Calcium: 9.6 mg/dL (ref 8.9–10.3)
Chloride: 103 mmol/L (ref 98–111)
Creatinine, Ser: 1.05 mg/dL (ref 0.61–1.24)
GFR calc Af Amer: >60 mL/min (ref >60)
GFR calc non Af Amer: >60 mL/min (ref >60)
Glucose, Bld: 109 mg/dL — ABNORMAL HIGH (ref 70–99)
Potassium: 5.1 mmol/L (ref 3.5–5.1)
Sodium: 138 mmol/L (ref 135–145)
Total Bilirubin: 0.5 mg/dL (ref 0.3–1.2)
Total Protein: 7.4 g/dL (ref 6.5–8.1)

## 2018-11-29 LAB — APTT: aPTT: 24 seconds (ref 24–36)

## 2018-11-29 LAB — ABO/RH: ABO/RH(D): A NEG

## 2018-11-29 LAB — PROTIME-INR
INR: 0.9 (ref 0.8–1.2)
Prothrombin Time: 12.5 seconds (ref 11.4–15.2)

## 2018-11-29 LAB — SURGICAL PCR SCREEN
MRSA, PCR: NEGATIVE
Staphylococcus aureus: NEGATIVE

## 2018-11-29 NOTE — Progress Notes (Signed)
SPOKE W/ Jon Jackson     SCREENING SYMPTOMS OF COVID 19:   COUGH--NO  RUNNY NOSE--- NO  SORE THROAT---NO  NASAL CONGESTION----NO  SNEEZING----NO  SHORTNESS OF BREATH---NO  DIFFICULTY BREATHING---NO  TEMP >100.0 -----NO  UNEXPLAINED BODY ACHES------NO  CHILLS --------NO   HEADACHES ---------NO  LOSS OF SMELL/ TASTE --------NO    HAVE YOU OR ANY FAMILY MEMBER TRAVELLED PAST 14 DAYS OUT OF THE   COUNTY---Lives in [facility] STATE----NO COUNTRY----NO  HAVE YOU OR ANY FAMILY MEMBER BEEN EXPOSED TO ANYONE WITH COVID 19? NO

## 2018-11-29 NOTE — Progress Notes (Addendum)
PCP - Dr. [name] last office visit note 11/22/2018 in epic Cardiologist - Dr. Elliot Cousin last office visit note 11/17/2017  Chest x-ray - 11/29/2018 in epic EKG - 11/29/2018 in epic Stress Test - N/A ECHO - N/A Cardiac Cath - N/A  Sleep Study - N/A CPAP - N/A  Fasting Blood Sugar - N/A Checks Blood Sugar N/A times a day  Blood Thinner Instructions: Hold Eliquis 3-4 days prior to procedure per telephone encounter 11/26/2018, per Mr. Emens last dose of Eliquis was 11/25/2018. Aspirin Instructions:N/A Last Dose:N/A  Anesthesia review: Past history of Afib. Cardiac clearance received for Myelogram 11/16/2018 in epic.  Patient denies shortness of breath, fever, cough and chest pain at PAT appointment   Patient verbalized understanding of instructions that were given to them at the PAT appointment. Patient was also instructed that they will need to review over the PAT instructions again at home before surgery.

## 2018-11-29 NOTE — Telephone Encounter (Addendum)
Patient with diagnosis of afib on Eliquis for anticoagulation.    Procedure: LUMBAR DECOMPRESSION  Date of procedure: 12-01-2018  CHADS2-VASc score of  2 (HTN, AGE) CrCl 73ml/min   Per office protocol, patient can hold Eliquis for 3-4 days prior to procedure.

## 2018-11-29 NOTE — H&P (Signed)
Jon Jackson is an 68 y.o. male.   Chief Complaint: low back pain  HPI: Kip presented to the office with the chief complaint of low back pain. Patient was referred over by Dr. Wolfgang Phoenix. Ongoing for 6 months. He has had a flare up. He has had a course of prednisone that helped while taking it. He has bilateral buttock pain and thigh pain. He saw Dr. Rolena Infante in the past with scoliosis. Years ago they decided no surgery for that, so he got an inversion table and was doing fine. He has had a significant pain in both sacroiliac joint regions into both buttocks and both sides. When he is at rest it does not bother him. CT myelogram showed severe stenosis at L3-L4.  Past Medical History:  Diagnosis Date  . Anxiety   . Arthritis   . Atrial fibrillation ([place])   . Basal cell carcinoma   . Depression   . GERD (gastroesophageal reflux disease)   . Hyperlipidemia   . Squamous carcinoma     Past Surgical History:  Procedure Laterality Date  . ATRIAL FIBRILLATION ABLATION  07/22/2016  . ATRIAL FIBRILLATION ABLATION N/A 07/22/2016   Procedure: Atrial Fibrillation Ablation;  Surgeon: Will Meredith Leeds, MD;  Location: [place] [facility];  Service: Cardiovascular;  Laterality: N/A;  . BASAL CELL CARCINOMA EXCISION     forehead; top of scalp  . BIOPSY  05/27/2017   Procedure: BIOPSY;  Surgeon: [name], MD;  [facility];  Service: Endoscopy;;  right colon, left colon;  . CARDIOVERSION N/A 04/07/2016   Procedure: CARDIOVERSION;  Surgeon: Satira Sark, MD;  [facility];  Service: Cardiovascular;  Laterality: N/A;  . COLECTOMY  1990s   removal of section due to strangulated bowel.  . COLONOSCOPY N/A 05/30/2013   Procedure: COLONOSCOPY;  Surgeon: [name], MD;  [facility];  Service: Endoscopy;  Laterality: N/A;  8:45 AM  . COLONOSCOPY    . COLONOSCOPY N/A 05/27/2017   Procedure: COLONOSCOPY;  Surgeon: [name], MD;  [facility];  Service:  Endoscopy;  Laterality: N/A;  1:00pm  . INGUINAL HERNIA REPAIR Right 1990s  . SHOULDER ARTHROSCOPY WITH ROTATOR CUFF REPAIR AND SUBACROMIAL DECOMPRESSION Left 05/15/2016   Procedure: LEFT SHOULDER ARTHROSCOPY WITH ROTATOR CUFF REPAIR AND SUBACROMIAL DECOMPRESSION AND DISTAL CLAVICLE RESECTION;  Surgeon: [name], MD;  Location: [place];  Service: Orthopedics;  Laterality: Left;  requests 2hrs  . SHOULDER SURGERY Right   . SQUAMOUS CELL CARCINOMA EXCISION     scalp  . TONSILLECTOMY      Family History  Problem Relation Age of Onset  . Colon cancer Brother 76  . Throat cancer Brother   . Breast cancer Mother    Social History:  reports that he has been smoking cigars. He has a 47.00 pack-year smoking history. He has never used smokeless tobacco. He reports current alcohol use of about 12.0 standard drinks of alcohol per week. He reports that he does not use drugs.  Allergies: No Known Allergies  No medications prior to admission.    Results for orders placed or performed during the hospital encounter of 11/29/18 (from the past 48 hour(s))  Surgical pcr screen     Status: None   Collection Time: 11/29/18  9:30 AM   Specimen: Vein; Nasal Swab  Result Value Ref Range   MRSA, PCR NEGATIVE NEGATIVE   Staphylococcus aureus NEGATIVE NEGATIVE    Comment: (NOTE) The Xpert SA  Assay (FDA approved for NASAL specimens in patients 34 years of age and older), is one component of a comprehensive surveillance program. It is not intended to diagnose infection nor to guide or monitor treatment. Performed at [facility], [place] [address], [place], [place] [address]   APTT     Status: None   Collection Time: 11/29/18  9:48 AM  Result Value Ref Range   aPTT 24 24 - 36 seconds    Comment: Performed at [facility], [place] [address], [place], [place] [address]  CBC WITH DIFFERENTIAL     Status: None   Collection Time: 11/29/18  9:48 AM  Result Value Ref Range    WBC 5.7 4.0 - 10.5 K/uL   RBC 4.72 4.22 - 5.81 MIL/uL   Hemoglobin 15.0 13.0 - 17.0 g/dL   HCT 45.9 39.0 - 52.0 %   MCV 97.2 80.0 - 100.0 fL   MCH 31.8 26.0 - 34.0 pg   MCHC 32.7 30.0 - 36.0 g/dL   RDW 12.1 11.5 - 15.5 %   Platelets 205 150 - 400 K/uL   nRBC 0.0 0.0 - 0.2 %   Neutrophils Relative % 70 %   Neutro Abs 4.0 1.7 - 7.7 K/uL   Lymphocytes Relative 15 %   Lymphs Abs 0.9 0.7 - 4.0 K/uL   Monocytes Relative 10 %   Monocytes Absolute 0.6 0.1 - 1.0 K/uL   Eosinophils Relative 4 %   Eosinophils Absolute 0.2 0.0 - 0.5 K/uL   Basophils Relative 1 %   Basophils Absolute 0.0 0.0 - 0.1 K/uL   Immature Granulocytes 0 %   Abs Immature Granulocytes 0.02 0.00 - 0.07 K/uL    Comment: Performed at [facility], [place] [address] [place], [place] [address]  Comprehensive metabolic panel     Status: Abnormal   Collection Time: 11/29/18  9:48 AM  Result Value Ref Range   Sodium 138 135 - 145 mmol/L   Potassium 5.1 3.5 - 5.1 mmol/L   Chloride 103 98 - 111 mmol/L   CO2 27 22 - 32 mmol/L   Glucose, Bld 109 (H) 70 - 99 mg/dL   BUN 19 8 - 23 mg/dL   Creatinine, Ser 1.05 0.61 - 1.24 mg/dL   Calcium 9.6 8.9 - 10.3 mg/dL   Total Protein 7.4 6.5 - 8.1 g/dL   Albumin 4.4 3.5 - 5.0 g/dL   AST 29 15 - 41 U/L   ALT 32 0 - 44 U/L   Alkaline Phosphatase 90 38 - 126 U/L   Total Bilirubin 0.5 0.3 - 1.2 mg/dL   GFR calc non Af Amer >60 >60 mL/min   GFR calc Af Amer >60 >60 mL/min   Anion gap 8 5 - 15    Comment: Performed at [facility], [place] [address] [place], [place] [address]  Protime-INR     Status: None   Collection Time: 11/29/18  9:48 AM  Result Value Ref Range   Prothrombin Time 12.5 11.4 - 15.2 seconds   INR 0.9 0.8 - 1.2    Comment: (NOTE) INR goal varies based on device and disease states. Performed at [facility], [place] [address], [place],  [address]   Type and screen Order type and screen if day of surgery is less  than 15 days from draw of preadmission visit or order morning of surgery if day of surgery is greater than 6 days from preadmission visit.  Status: None   Collection Time: 11/29/18  9:48 AM  Result Value Ref Range   ABO/RH(D) A NEG    Antibody Screen NEG    Sample Expiration 12/13/2018,2359    Extend sample reason      NO TRANSFUSIONS OR PREGNANCY IN THE PAST 3 MONTHS Performed at [facility], [place] [address], [place], [place] [address]   ABO/Rh     Status: None (Preliminary result)   Collection Time: 11/29/18  9:48 AM  Result Value Ref Range   ABO/RH(D)      A NEG Performed at [facility], [place] [address], [place], [place] [address]    Dg Lumbar Spine 2-3 Views  Result Date: 11/29/2018 CLINICAL DATA:  Low back pain EXAM: LUMBAR SPINE - 2-3 VIEW COMPARISON:  CT 11/16/2018 FINDINGS: Five lumbar type vertebral segments. Vertebral body heights are maintained. No fracture identified. Lumbar levocurvature. Multilevel intervertebral disc space loss with associated degenerative endplate changes. Advanced lower lumbar facet arthrosis. Osseous structures appear demineralized. Aortoiliac vascular calcifications. Postsurgical changes project over the left hemiabdomen. IMPRESSION: Moderate multilevel lumbar spondylosis. Electronically Signed   By: Davina Poke M.D.   On: 11/29/2018 10:29    Review of Systems  Constitutional: Negative.   HENT: Negative.   Eyes: Negative.   Respiratory: Negative.   Cardiovascular: Negative.   Gastrointestinal: Negative.   Genitourinary: Negative.   Musculoskeletal: Positive for back pain, joint pain and myalgias. Negative for falls and neck pain.  Skin: Negative.   Neurological: Negative.   Endo/Heme/Allergies: Negative.   Psychiatric/Behavioral: Negative for depression, hallucinations, memory loss, substance abuse and suicidal ideas. The patient is nervous/anxious. The patient does not have insomnia.    Vitals BP  148/64 HR 82 bpm Wt 101.3kg Ht 5'10   Physical Exam  Constitutional: He is oriented to person, place, and time. He appears well-developed. No distress.  Obese  HENT:  Head: Normocephalic and atraumatic.  Right Ear: External ear normal.  Left Ear: External ear normal.  Nose: Nose normal.  Mouth/Throat: Oropharynx is clear and moist.  Eyes: Conjunctivae and EOM are normal.  Neck: Normal range of motion. Neck supple.  Cardiovascular: Normal rate, regular rhythm, normal heart sounds and intact distal pulses.  No murmur heard. Respiratory: Effort normal and breath sounds normal. No respiratory distress. He has no wheezes.  GI: Soft. Bowel sounds are normal. He exhibits no distension. There is no abdominal tenderness.  Musculoskeletal:     Right hip: Normal.     Left hip: Normal.     Right knee: Normal.     Left knee: Normal.     Comments: His lumbar spine bothers him with flexion and extension, mainly flexion. Lateral bending of left and right not painful. No major spasms. No CVA tenderness. Positive SLR bilaterally.  Neurological: He is alert and oriented to person, place, and time. He has normal strength. No sensory deficit.  Skin: No rash noted. He is not diaphoretic. No erythema.  Psychiatric: He has a normal mood and affect. His behavior is normal.     Assessment/Plan Lumbar spinal stenosis L3-L4 He needs a decompressive lumbar laminectomy at L3-L4. Risks and benefits of the procedure discussed with the patient by Dr.Gioffre and the patient elected to move forward with the procedure. He will stay overnight for observation.   H&P performed by Dr. Latanya Maudlin Documented by Ardeen Jourdain, PA-C   Jaydeen Odor Thomasenia Sales, PA-C 11/29/2018, 12:20 PM

## 2018-11-30 ENCOUNTER — Encounter (HOSPITAL_COMMUNITY): Payer: Self-pay | Admitting: Anesthesiology

## 2018-11-30 ENCOUNTER — Encounter (HOSPITAL_COMMUNITY): Payer: Self-pay | Admitting: Physician Assistant

## 2018-11-30 MED ORDER — BUPIVACAINE LIPOSOME 1.3 % IJ SUSP
20.0000 mL | Freq: Once | INTRAMUSCULAR | Status: DC
  Filled 2018-11-30: qty 20

## 2018-11-30 NOTE — Telephone Encounter (Signed)
This patient does not have documented history of CAD or CHF. He has history of atrial fibrillation status post A. fib ablation 2018.  He was maintaining sinus rhythm when last seen by Dr. Curt Bears in 10/2017.  He continues on anticoagulation.  The patient was to be seen for 31-month follow-up in February of this year, however he has not been seen in just over a year, 10/2017.  Therefore, we should actually see the patient back in the office prior to clearance.  He did have an EKG on 11/29/2018 that showed continued sinus bradycardia, 56 bpm.  Of note, the patient's procedure is scheduled for tomorrow.  If the surgeon requires medical clearance, the procedure may have to be postponed.  I will route this to the preop call back pool.    Pre-op covering staff: - Please schedule appointment and call patient to inform them. - Please contact requesting surgeon's office via preferred method (i.e, phone, fax) to inform them of need for appointment prior to surgery.  Anticoagulation has already been addressed.  Daune Perch, NP  11/30/2018, 10:02 AM

## 2018-11-30 NOTE — Telephone Encounter (Signed)
Pt is scheduled to see Dr. Curt Bears 12/21/2018. Will route back to requesting surgeon's office to make them aware.

## 2018-11-30 NOTE — Anesthesia Preprocedure Evaluation (Deleted)
Anesthesia Evaluation    Airway       Dental   Pulmonary Current Smoker,          Cardiovascular     Neuro/Psych    GI/Hepatic   Endo/Other    Renal/GU      Musculoskeletal   Abdominal   Peds  Hematology   Anesthesia Other Findings   Reproductive/Obstetrics                           Anesthesia Physical Anesthesia Plan Anesthesia Quick Evaluation  

## 2018-11-30 NOTE — Progress Notes (Signed)
Anesthesia Chart Review   Case: [id number] Date/Time: 12/01/18 0820   Procedure: Decompressive lumbar laminectomy at L3-L4 (N/A ) - 121min   Anesthesia type: General   Pre-op diagnosis: spinal stenosis with complete block at L3-L4   [facility]OM 08 / [facility]   Surgeon: Latanya Maudlin, MD      DISCUSSION:68 y.o. current some day smoker (45 pack years) with h/o GERD, HLD, depression, anxiety, A-fib (s/p ablation 2018, on Eliquis), spinal stenosis with complete block at L3-L4 scheduled for above procedure 12/01/2018 with Dr. Latanya Maudlin.   Pt advised to hold Eliquis 3-4 days prior to procedure.   Cardiac clearance requested by surgeon.  Per Johna Roles, NP with cardiology on 11/30/2018 pt has not been seen in over a year therefore he will need an appointment before clearance can be given.  He has no h/o CHF or CAD. Stable at last visit 11/17/2017. Cardiologist office contacted Dr. Gladstone Lighter to make him aware.  VS: BP (!) 162/88   Pulse 74   Temp 36.9 C (Oral)   Ht 5\' 10"  (1.778 m)   Wt 101.3 kg   BMI 32.04 kg/m   PROVIDERS: Mikey Kirschner, MD is PCP   Reggy Eye, MD is Cardiologist  LABS: Labs reviewed: Acceptable for surgery. (all labs ordered are listed, but only abnormal results are displayed)  Labs Reviewed  COMPREHENSIVE METABOLIC PANEL - Abnormal; Notable for the following components:      Result Value   Glucose, Bld 109 (*)    All other components within normal limits  SURGICAL PCR SCREEN  APTT  CBC WITH DIFFERENTIAL/PLATELET  PROTIME-INR  TYPE AND SCREEN  ABO/RH     IMAGES:   EKG: 11/29/2018 Rate 56 bpm Sinus bradycardia  CV: Echo 03/04/2016 Study Conclusions  - Left ventricle: The cavity size was normal. Wall thickness was   increased in a pattern of mild LVH. The estimated ejection   fraction was 55%. Wall motion was normal; there were no regional   wall motion abnormalities. The study is not technically   sufficient to allow evaluation  of LV diastolic function. - Aortic valve: Trileaflet; moderately calcified leaflets.   Noncoronary cusp mobility was severely restricted. There was mild   stenosis. Aortic valve area approximately 1.5-1.7 cm^2 by   planimetry. - Aortic root: The aortic root was mildly ectatic. - Mitral valve: Calcified annulus. There was trivial regurgitation. - Left atrium: The atrium was mildly dilated. - Right atrium: The atrium was mildly to moderately dilated. - Atrial septum: No defect or patent foramen ovale was identified. - Tricuspid valve: There was trivial regurgitation. - Pulmonary arteries: Systolic pressure could not be accurately   estimated. - Pericardium, extracardiac: There was no pericardial effusion.  Impressions:  - Mild LVH with LVEF approximately 55%. Indeterminate diastolic   function in the setting of atrial fibrillation. Mild left atrial   enlargement. Mildly calcified mitral annulus with trivial mitral   regurgitation. Mild calcific aortic stenosis as outlined above.   Mildly ectatic aortic root. Trivial tricuspid regurgitation. Past Medical History:  Diagnosis Date  . Anxiety   . Arthritis   . Atrial fibrillation ([place])   . Basal cell carcinoma   . Depression   . GERD (gastroesophageal reflux disease)   . Hyperlipidemia   . Squamous carcinoma     Past Surgical History:  Procedure Laterality Date  . ATRIAL FIBRILLATION ABLATION  07/22/2016  . ATRIAL FIBRILLATION ABLATION N/A 07/22/2016   Procedure: Atrial Fibrillation Ablation;  Surgeon: Will  Meredith Leeds, MD;  Location: [place] [facility];  Service: Cardiovascular;  Laterality: N/A;  . BASAL CELL CARCINOMA EXCISION     forehead; top of scalp  . BIOPSY  05/27/2017   Procedure: BIOPSY;  Surgeon: [name], MD;  [facility];  Service: Endoscopy;;  right colon, left colon;  . CARDIOVERSION N/A 04/07/2016   Procedure: CARDIOVERSION;  Surgeon: Satira Sark, MD;  [facility];  Service:  Cardiovascular;  Laterality: N/A;  . COLECTOMY  1990s   removal of section due to strangulated bowel.  . COLONOSCOPY N/A 05/30/2013   Procedure: COLONOSCOPY;  Surgeon: [name], MD;  [facility];  Service: Endoscopy;  Laterality: N/A;  8:45 AM  . COLONOSCOPY    . COLONOSCOPY N/A 05/27/2017   Procedure: COLONOSCOPY;  Surgeon: [name], MD;  [facility];  Service: Endoscopy;  Laterality: N/A;  1:00pm  . INGUINAL HERNIA REPAIR Right 1990s  . SHOULDER ARTHROSCOPY WITH ROTATOR CUFF REPAIR AND SUBACROMIAL DECOMPRESSION Left 05/15/2016   Procedure: LEFT SHOULDER ARTHROSCOPY WITH ROTATOR CUFF REPAIR AND SUBACROMIAL DECOMPRESSION AND DISTAL CLAVICLE RESECTION;  Surgeon: [name], MD;  Location: [place];  Service: Orthopedics;  Laterality: Left;  requests 2hrs  . SHOULDER SURGERY Right   . SQUAMOUS CELL CARCINOMA EXCISION     scalp  . TONSILLECTOMY      MEDICATIONS: . acetaminophen (TYLENOL) 500 MG tablet  . citalopram (CELEXA) 20 MG tablet  . ELIQUIS 5 MG TABS tablet  . ibuprofen (ADVIL,MOTRIN) 200 MG tablet  . levocetirizine (XYZAL) 5 MG tablet  . Melatonin 10 MG TABS  . Multiple Vitamin (MULTIVITAMIN) tablet  . Naphazoline-Pheniramine (ALLERGY EYE OP)  . simvastatin (ZOCOR) 20 MG tablet   No current facility-administered medications for this encounter.    Derrill Memo ON 12/01/2018] bupivacaine liposome (EXPAREL) 1.3 % injection 266 mg    Maia Plan [facility] Pre-Surgical Testing [phone] 11/30/18 11:48 AM

## 2018-12-01 ENCOUNTER — Ambulatory Visit (HOSPITAL_COMMUNITY): Admission: RE | Admit: 2018-12-01 | Payer: Medicare Other | Source: Home / Self Care | Admitting: Orthopedic Surgery

## 2018-12-01 ENCOUNTER — Encounter (HOSPITAL_COMMUNITY): Admission: RE | Payer: Self-pay | Source: Home / Self Care

## 2018-12-01 LAB — TYPE AND SCREEN
ABO/RH(D): A NEG
Antibody Screen: NEGATIVE

## 2018-12-01 SURGERY — LUMBAR LAMINECTOMY/DECOMPRESSION MICRODISCECTOMY 1 LEVEL
Anesthesia: General

## 2018-12-21 ENCOUNTER — Ambulatory Visit (INDEPENDENT_AMBULATORY_CARE_PROVIDER_SITE_OTHER): Payer: Medicare Other | Admitting: Cardiology

## 2018-12-21 ENCOUNTER — Other Ambulatory Visit: Payer: Self-pay

## 2018-12-21 ENCOUNTER — Encounter: Payer: Self-pay | Admitting: Cardiology

## 2018-12-21 VITALS — BP 152/78 | HR 79 | Ht 70.0 in | Wt 229.2 lb

## 2018-12-21 DIAGNOSIS — I4819 Other persistent atrial fibrillation: Secondary | ICD-10-CM | POA: Diagnosis not present

## 2018-12-21 DIAGNOSIS — I10 Essential (primary) hypertension: Secondary | ICD-10-CM

## 2018-12-21 MED ORDER — APIXABAN 5 MG PO TABS: 5.0000 mg | ORAL_TABLET | Freq: Two times a day (BID) | ORAL | 3 refills | Status: DC

## 2018-12-21 NOTE — Patient Instructions (Addendum)
Medication Instructions: The current medical regimen is effective;  continue present plan and medications.  If you need a refill on your cardiac medications before your next appointment, please call your pharmacy.   Follow-Up: Follow up in 6 months with Dr Curt Bears or one of his PA/NP.    Thank you for choosing [facility]!!

## 2018-12-21 NOTE — Progress Notes (Signed)
Electrophysiology Office Note   Date:  12/21/2018   ID:  Jon Jackson, DOB February 02, 1951, MRN [id number]  PCP:  Mikey Kirschner, MD  Cardiologist:  [name] Primary Electrophysiologist:  Joaovictor Krone Meredith Leeds, MD    No chief complaint on file.    History of Present Illness: Jon Jackson is a 68 y.o. male who presents today for electrophysiology evaluation.   Jon Jackson is a 68 y.o. male who is being seen today for the evaluation of atrial fibrillation at the request of Luking, [name], MD. He has a history of persistent atrial fibrillation. Had AF ablation on 07/22/16.  Today, denies symptoms of palpitations, chest pain, shortness of breath, orthopnea, PND, lower extremity edema, claudication, dizziness, presyncope, syncope, bleeding, or neurologic sequela. The patient is tolerating medications without difficulties.  He is currently feeling well other than back pain.  He is planned for L-spine surgery.  He does state that he has triggers for atrial fibrillation which include extra strength Mucinex.  He has been avoiding this medication.   Past Medical History:  Diagnosis Date  . Anxiety   . Arthritis   . Atrial fibrillation ([place])   . Basal cell carcinoma   . Depression   . GERD (gastroesophageal reflux disease)   . Hyperlipidemia   . Squamous carcinoma    Past Surgical History:  Procedure Laterality Date  . ATRIAL FIBRILLATION ABLATION  07/22/2016  . ATRIAL FIBRILLATION ABLATION N/A 07/22/2016   Procedure: Atrial Fibrillation Ablation;  Surgeon: Heidemarie Goodnow Meredith Leeds, MD;  Location: [facility];  Service: Cardiovascular;  Laterality: N/A;  . BASAL CELL CARCINOMA EXCISION     forehead; top of scalp  . BIOPSY  05/27/2017   Procedure: BIOPSY;  Surgeon: [name], MD;  [facility];  Service: Endoscopy;;  right colon, left colon;  . CARDIOVERSION N/A 04/07/2016   Procedure: CARDIOVERSION;  Surgeon: Satira Sark, MD;  Location: [facility];   Service: Cardiovascular;  Laterality: N/A;  . COLECTOMY  1990s   removal of section due to strangulated bowel.  . COLONOSCOPY N/A 05/30/2013   Procedure: COLONOSCOPY;  Surgeon: [name], MD;  [facility];  Service: Endoscopy;  Laterality: N/A;  8:45 AM  . COLONOSCOPY    . COLONOSCOPY N/A 05/27/2017   Procedure: COLONOSCOPY;  Surgeon: [name], MD;  [facility];  Service: Endoscopy;  Laterality: N/A;  1:00pm  . INGUINAL HERNIA REPAIR Right 1990s  . SHOULDER ARTHROSCOPY WITH ROTATOR CUFF REPAIR AND SUBACROMIAL DECOMPRESSION Left 05/15/2016   Procedure: LEFT SHOULDER ARTHROSCOPY WITH ROTATOR CUFF REPAIR AND SUBACROMIAL DECOMPRESSION AND DISTAL CLAVICLE RESECTION;  Surgeon: [name], MD;  Location: [facility];  Service: Orthopedics;  Laterality: Left;  requests 2hrs  . SHOULDER SURGERY Right   . SQUAMOUS CELL CARCINOMA EXCISION     scalp  . TONSILLECTOMY       Current Outpatient Medications  Medication Sig Dispense Refill  . acetaminophen (TYLENOL) 500 MG tablet Take 1,000-1,500 mg by mouth every 6 (six) hours as needed for moderate pain or headache.     Marland Kitchen apixaban (ELIQUIS) 5 MG TABS tablet Take 1 tablet (5 mg total) by mouth 2 (two) times daily. 180 tablet 3  . citalopram (CELEXA) 20 MG tablet Take 1 tablet (20 mg total) by mouth daily. 90 tablet 1  . ibuprofen (ADVIL,MOTRIN) 200 MG tablet Take 400-600 mg by mouth every 6 (six) hours as needed for headache or moderate pain.     Marland Kitchen  levocetirizine (XYZAL) 5 MG tablet Take 5 mg by mouth daily.    . Melatonin 10 MG TABS Take 10 mg by mouth at bedtime as needed (sleep).    . Multiple Vitamin (MULTIVITAMIN) tablet Take 1 tablet by mouth daily.    [name] Fill (ALLERGY EYE OP) Place 1-2 drops into both eyes 3 (three) times daily as needed (for seasonal allergy/itchy eyes).    . simvastatin (ZOCOR) 20 MG tablet Take 10 mg by mouth daily.     No current facility-administered medications for this visit.      Allergies:   Patient has no known allergies.   Social History:  The patient  reports that he has been smoking cigars. He has a 47.00 pack-year smoking history. He has never used smokeless tobacco. He reports current alcohol use of about 12.0 standard drinks of alcohol per week. He reports that he does not use drugs.   Family History:  The patient's family history includes Breast cancer in his mother; Colon cancer (age of onset: 7) in his brother; Throat cancer in his brother.    ROS:  Please see the history of present illness.   Otherwise, review of systems is positive for none.   All other systems are reviewed and negative.   PHYSICAL EXAM: VS:  BP (!) 152/78   Pulse 79   Ht 5\' 10"  (1.778 m)   Wt 229 lb 3.2 oz (104 kg)   SpO2 97%   BMI 32.89 kg/m  , BMI Body mass index is 32.89 kg/m. GEN: Well nourished, well developed, in no acute distress  HEENT: normal  Neck: no JVD, carotid bruits, or masses Cardiac: RRR; no murmurs, rubs, or gallops,no edema  Respiratory:  clear to auscultation bilaterally, normal work of breathing GI: soft, nontender, nondistended, + BS MS: no deformity or atrophy  Skin: warm and dry Neuro:  Strength and sensation are intact Psych: euthymic mood, full affect  EKG:  EKG is not ordered today. Personal review of the ekg ordered 11/29/18 shows SR, rate 56    Recent Labs: 11/29/2018: ALT 32; BUN 19; Creatinine, Ser 1.05; Hemoglobin 15.0; Platelets 205; Potassium 5.1; Sodium 138    Lipid Panel     Component Value Date/Time   CHOL 202 (H) 11/18/2018 0906   TRIG 98 11/18/2018 0906   HDL 47 11/18/2018 0906   CHOLHDL 4.3 11/18/2018 0906   CHOLHDL 3.1 11/18/2014 0846   VLDL 14 11/18/2014 0846   LDLCALC 135 (H) 11/18/2018 0906     Wt Readings from Last 3 Encounters:  12/21/18 229 lb 3.2 oz (104 kg)  11/29/18 223 lb 5 oz (101.3 kg)  05/24/18 230 lb 6.4 oz (104.5 kg)      Other studies Reviewed: Additional studies/ records that were reviewed  today include: TTE 03/04/16  Review of the above records today demonstrates:  - Left ventricle: The cavity size was normal. Wall thickness was   increased in a pattern of mild LVH. The estimated ejection   fraction was 55%. Wall motion was normal; there were no regional   wall motion abnormalities. The study is not technically   sufficient to allow evaluation of LV diastolic function. - Aortic valve: Trileaflet; moderately calcified leaflets.   Noncoronary cusp mobility was severely restricted. There was mild   stenosis. Aortic valve area approximately 1.5-1.7 cm^2 by   planimetry. - Aortic root: The aortic root was mildly ectatic. - Mitral valve: Calcified annulus. There was trivial regurgitation. - Left atrium: The atrium was mildly  dilated. - Right atrium: The atrium was mildly to moderately dilated. - Atrial septum: No defect or patent foramen ovale was identified. - Tricuspid valve: There was trivial regurgitation. - Pulmonary arteries: Systolic pressure could not be accurately   estimated. - Pericardium, extracardiac: There was no pericardial effusion.   ASSESSMENT AND PLAN:  1.  Persistent atrial fibrillation: Currently on Eliquis.  Status post ablation 07/22/2016.  No obvious recurrences.  No changes.    This patients CHA2DS2-VASc Score and unadjusted Ischemic Stroke Rate (% per year) is equal to 3.2 % stroke rate/year from a score of 3  Above score calculated as 1 point each if present [CHF, HTN, DM, Vascular=MI/PAD/Aortic Plaque, Age if 65-74, or Male] Above score calculated as 2 points each if present [Age > 75, or Stroke/TIA/TE]   2. Essential hypertension: Is elevated today.  It was normal in February.  I have asked him to go to the drugstore and check his blood pressure over the next few weeks.  He Chirag Krueger call us back if it remains elevated.  3. Hyperlipidemia: Continue simvastatin per primary physician  4.  Preoperative evaluation: Plan for L-spine surgery.  This is  an intermediate risk procedure.  He would at intermediate risk.  Would hold Eliquis for 3 days prior to the operation and restart as soon as possible per surgical recommendations.  Current medicines are reviewed at length with the patient today.   The patient does not have concerns regarding his medicines.  The following changes were made today: none  Labs/ tests ordered today include:  No orders of the defined types were placed in this encounter.    Disposition:   FU with Adalena Abdulla 6 months  Signed, Sergey Ishler Meredith Leeds, MD  12/21/2018 9:55 AM     [facility] [address] [place] [address] [phone] (office) [phone] (fax)

## 2018-12-23 NOTE — Patient Instructions (Signed)
DUE TO COVID-19 ONLY ONE VISITOR IS ALLOWED TO COME WITH YOU AND STAY IN THE WAITING ROOM ONLY DURING PRE OP AND PROCEDURE DAY OF SURGERY. THE 1 VISITOR MAY VISIT WITH YOU AFTER SURGERY IN YOUR PRIVATE ROOM DURING VISITING HOURS ONLY!  YOU NEED TO HAVE A COVID 19 TEST ON_______ @_______ , THIS TEST MUST BE DONE BEFORE SURGERY, COME  Jon Jackson , [id number].  ([place]) ONCE YOUR COVID TEST IS COMPLETED, PLEASE BEGIN THE QUARANTINE INSTRUCTIONS AS OUTLINED IN YOUR HANDOUT.                Jon Jackson  12/23/2018   Your procedure is scheduled on: 12-29-18   Report to [facility] Main  Entrance   Report to admitting at      0730  AM     Call this number if you have problems the morning of surgery [phone]    Remember:NO SOLID FOOD AFTER MIDNIGHT THE NIGHT PRIOR TO SURGERY. NOTHING BY MOUTH EXCEPT CLEAR LIQUIDS UNTIL    0630  . PLEASE FINISH ENSURE DRINK PER SURGEON ORDER  WHICH NEEDS TO BE COMPLETED AT     0630 am then nothing by mouth .    CLEAR LIQUID DIET   Foods Allowed                                                                           Foods Excluded  Coffee and tea, regular and decaf                                         liquids that you cannot  Plain Jell-O any favor except red or purple                         see through such as: Fruit ices (not with fruit pulp)                                                 milk, soups, orange juice  Iced Popsicles                                                 All solid food Carbonated beverages, regular and diet                                    Cranberry, grape and apple juices Sports drinks like Gatorade Lightly seasoned clear broth or consume(fat free) Sugar, honey syrup   _____________________________________________________________________    BRUSH YOUR TEETH MORNING OF SURGERY AND RINSE YOUR MOUTH OUT, NO CHEWING GUM CANDY OR MINTS.     Take these medicines the  morning of surgery with A SIP OF WATER: zocor, xyzal, celexa, tylenol  if needed                                 You may not have any metal on your body including hair pins and              piercings  Do not wear jewelry,  lotions, powders or perfumes, deodorant                        Men may shave face and neck.   Do not bring valuables to the hospital. [place].  Contacts, dentures or bridgework may not be worn into surgery.                Please read over the following fact sheets you were given: _____________________________________________________________________            [facility] - Preparing for Surgery Before surgery, you can play an important role.  Because skin is not sterile, your skin needs to be as free of germs as possible.  You can reduce the number of germs on your skin by washing with CHG (chlorahexidine gluconate) soap before surgery.  CHG is an antiseptic cleaner which kills germs and bonds with the skin to continue killing germs even after washing. Please DO NOT use if you have an allergy to CHG or antibacterial soaps.  If your skin becomes reddened/irritated stop using the CHG and inform your nurse when you arrive at Short Stay. Do not shave (including legs and underarms) for at least 48 hours prior to the first CHG shower.  You may shave your face/neck. Please follow these instructions carefully:  1.  Shower with CHG Soap the night before surgery and the  morning of Surgery.  2.  If you choose to wash your hair, wash your hair first as usual with your  normal  shampoo.  3.  After you shampoo, rinse your hair and body thoroughly to remove the  shampoo.                           4.  Use CHG as you would any other liquid soap.  You can apply chg directly  to the skin and wash                       Gently with a scrungie or clean washcloth.  5.  Apply the CHG Soap to your body ONLY FROM THE NECK DOWN.   Do not use on  face/ open                           Wound or open sores. Avoid contact with eyes, ears mouth and genitals (private parts).                       Wash face,  Genitals (private parts) with your normal soap.             6.  Wash thoroughly, paying special attention to the area where your surgery  will be performed.  7.  Thoroughly rinse your body with warm water from the neck down.  8.  DO NOT shower/wash with your normal soap after using and  rinsing off  the CHG Soap.                9.  Pat yourself dry with a clean towel.            10.  Wear clean pajamas.            11.  Place clean sheets on your bed the night of your first shower and do not  sleep with pets. Day of Surgery : Do not apply any lotions/deodorants the morning of surgery.  Please wear clean clothes to the hospital/surgery center.  FAILURE TO FOLLOW THESE INSTRUCTIONS MAY RESULT IN THE CANCELLATION OF YOUR SURGERY PATIENT SIGNATURE_________________________________  NURSE SIGNATURE__________________________________  ________________________________________________________________________   Jon Jackson  An incentive spirometer is a tool that can help keep your lungs clear and active. This tool measures how well you are filling your lungs with each breath. Taking long deep breaths may help reverse or decrease the chance of developing breathing (pulmonary) problems (especially infection) following:  A long period of time when you are unable to move or be active. BEFORE THE PROCEDURE   If the spirometer includes an indicator to show your best effort, your nurse or respiratory therapist will set it to a desired goal.  If possible, sit up straight or lean slightly forward. Try not to slouch.  Hold the incentive spirometer in an upright position. INSTRUCTIONS FOR USE  1. Sit on the edge of your bed if possible, or sit up as far as you can in bed or on a chair. 2. Hold the incentive spirometer in an upright  position. 3. Breathe out normally. 4. Place the mouthpiece in your mouth and seal your lips tightly around it. 5. Breathe in slowly and as deeply as possible, raising the piston or the ball toward the top of the column. 6. Hold your breath for 3-5 seconds or for as long as possible. Allow the piston or ball to fall to the bottom of the column. 7. Remove the mouthpiece from your mouth and breathe out normally. 8. Rest for a few seconds and repeat Steps 1 through 7 at least 10 times every 1-2 hours when you are awake. Take your time and take a few normal breaths between deep breaths. 9. The spirometer may include an indicator to show your best effort. Use the indicator as a goal to work toward during each repetition. 10. After each set of 10 deep breaths, practice coughing to be sure your lungs are clear. If you have an incision (the cut made at the time of surgery), support your incision when coughing by placing a pillow or rolled up towels firmly against it. Once you are able to get out of bed, walk around indoors and cough well. You may stop using the incentive spirometer when instructed by your caregiver.  RISKS AND COMPLICATIONS  Take your time so you do not get dizzy or light-headed.  If you are in pain, you may need to take or ask for pain medication before doing incentive spirometry. It is harder to take a deep breath if you are having pain. AFTER USE  Rest and breathe slowly and easily.  It can be helpful to keep track of a log of your progress. Your caregiver can provide you with a simple table to help with this. If you are using the spirometer at home, follow these instructions: [place] IF:   You are having difficultly using the spirometer.  You have trouble using the  spirometer as often as instructed.  Your pain medication is not giving enough relief while using the spirometer.  You develop fever of 100.5 F (38.1 C) or higher. SEEK IMMEDIATE MEDICAL CARE IF:    You cough up bloody sputum that had not been present before.  You develop fever of 102 F (38.9 C) or greater.  You develop worsening pain at or near the incision site. MAKE SURE YOU:   Understand these instructions.  Will watch your condition.  Will get help right away if you are not doing well or get worse. Document Released: 07/28/2006 Document Revised: 06/09/2011 Document Reviewed: 09/28/2006 Exit[employer] Patient Information 2014 Exit[employer], [place].   ________________________________________________________________________  WHAT IS A BLOOD TRANSFUSION? Blood Transfusion Information  A transfusion is the replacement of blood or some of its parts. Blood is made up of multiple cells which provide different functions.  Red blood cells carry oxygen and are used for blood loss replacement.  White blood cells fight against infection.  Platelets control bleeding.  Plasma helps clot blood.  Other blood products are available for specialized needs, such as hemophilia or other clotting disorders. BEFORE THE TRANSFUSION  Who gives blood for transfusions?   Healthy volunteers who are fully evaluated to make sure their blood is safe. This is blood bank blood. Transfusion therapy is the safest it has ever been in the practice of medicine. Before blood is taken from a donor, a complete history is taken to make sure that person has no history of diseases nor engages in risky social behavior (examples are intravenous drug use or sexual activity with multiple partners). The donor's travel history is screened to minimize risk of transmitting infections, such as malaria. The donated blood is tested for signs of infectious diseases, such as HIV and hepatitis. The blood is then tested to be sure it is compatible with you in order to minimize the chance of a transfusion reaction. If you or a relative donates blood, this is often done in anticipation of surgery and is not appropriate for emergency  situations. It takes many days to process the donated blood. RISKS AND COMPLICATIONS Although transfusion therapy is very safe and saves many lives, the main dangers of transfusion include:   Getting an infectious disease.  Developing a transfusion reaction. This is an allergic reaction to something in the blood you were given. Every precaution is taken to prevent this. The decision to have a blood transfusion has been considered carefully by your caregiver before blood is given. Blood is not given unless the benefits outweigh the risks. AFTER THE TRANSFUSION  Right after receiving a blood transfusion, you will usually feel much better and more energetic. This is especially true if your red blood cells have gotten low (anemic). The transfusion raises the level of the red blood cells which carry oxygen, and this usually causes an energy increase.  The nurse administering the transfusion will monitor you carefully for complications. HOME CARE INSTRUCTIONS  No special instructions are needed after a transfusion. You may find your energy is better. Speak with your caregiver about any limitations on activity for underlying diseases you may have. SEEK MEDICAL CARE IF:   Your condition is not improving after your transfusion.  You develop redness or irritation at the intravenous (IV) site. SEEK IMMEDIATE MEDICAL CARE IF:  Any of the following symptoms occur over the next 12 hours:  Shaking chills.  You have a temperature by mouth above 102 F (38.9 C), not controlled by medicine.  Chest,  back, or muscle pain.  People around you feel you are not acting correctly or are confused.  Shortness of breath or difficulty breathing.  Dizziness and fainting.  You get a rash or develop hives.  You have a decrease in urine output.  Your urine turns a dark color or changes to pink, red, or brown. Any of the following symptoms occur over the next 10 days:  You have a temperature by mouth above  102 F (38.9 C), not controlled by medicine.  Shortness of breath.  Weakness after normal activity.  The white part of the eye turns yellow (jaundice).  You have a decrease in the amount of urine or are urinating less often.  Your urine turns a dark color or changes to pink, red, or brown. Document Released: 03/14/2000 Document Revised: 06/09/2011 Document Reviewed: 11/01/2007 [facility] Patient Information 2014 [place], [place].  _______________________________________________________________________

## 2018-12-24 ENCOUNTER — Encounter (HOSPITAL_COMMUNITY): Payer: Self-pay

## 2018-12-24 ENCOUNTER — Encounter (HOSPITAL_COMMUNITY)
Admission: RE | Admit: 2018-12-24 | Discharge: 2018-12-24 | Disposition: A | Discharge status: Home / Self Care | Payer: Medicare Other | Source: Ambulatory Visit | Attending: Orthopedic Surgery | Admitting: Orthopedic Surgery

## 2018-12-24 ENCOUNTER — Other Ambulatory Visit: Payer: Self-pay

## 2018-12-24 DIAGNOSIS — Z0184 Encounter for antibody response examination: Secondary | ICD-10-CM | POA: Insufficient documentation

## 2018-12-24 DIAGNOSIS — M48061 Spinal stenosis, lumbar region without neurogenic claudication: Secondary | ICD-10-CM | POA: Insufficient documentation

## 2018-12-24 DIAGNOSIS — Z01812 Encounter for preprocedural laboratory examination: Secondary | ICD-10-CM | POA: Insufficient documentation

## 2018-12-24 DIAGNOSIS — M545 Low back pain, unspecified: Secondary | ICD-10-CM

## 2018-12-24 LAB — CBC WITH DIFFERENTIAL/PLATELET
Abs Immature Granulocytes: 0.01 10*3/uL (ref 0.00–0.07)
Basophils Absolute: 0 10*3/uL (ref 0.0–0.1)
Basophils Relative: 1 %
Eosinophils Absolute: 0.4 10*3/uL (ref 0.0–0.5)
Eosinophils Relative: 6 %
HCT: 46 % (ref 39.0–52.0)
Hemoglobin: 15 g/dL (ref 13.0–17.0)
Immature Granulocytes: 0 %
Lymphocytes Relative: 17 %
Lymphs Abs: 1.1 10*3/uL (ref 0.7–4.0)
MCH: 32 pg (ref 26.0–34.0)
MCHC: 32.6 g/dL (ref 30.0–36.0)
MCV: 98.1 fL (ref 80.0–100.0)
Monocytes Absolute: 0.6 10*3/uL (ref 0.1–1.0)
Monocytes Relative: 9 %
Neutro Abs: 4.3 10*3/uL (ref 1.7–7.7)
Neutrophils Relative %: 67 %
Platelets: 226 10*3/uL (ref 150–400)
RBC: 4.69 MIL/uL (ref 4.22–5.81)
RDW: 12.2 % (ref 11.5–15.5)
WBC: 6.3 10*3/uL (ref 4.0–10.5)
nRBC: 0 % (ref 0.0–0.2)

## 2018-12-24 LAB — APTT: aPTT: 25 seconds (ref 24–36)

## 2018-12-24 LAB — COMPREHENSIVE METABOLIC PANEL
ALT: 25 U/L (ref 0–44)
AST: 20 U/L (ref 15–41)
Albumin: 4.1 g/dL (ref 3.5–5.0)
Alkaline Phosphatase: 73 U/L (ref 38–126)
Anion gap: 10 (ref 5–15)
BUN: 15 mg/dL (ref 8–23)
CO2: 22 mmol/L (ref 22–32)
Calcium: 9.3 mg/dL (ref 8.9–10.3)
Chloride: 107 mmol/L (ref 98–111)
Creatinine, Ser: 0.92 mg/dL (ref 0.61–1.24)
GFR calc Af Amer: >60 mL/min (ref >60)
GFR calc non Af Amer: >60 mL/min (ref >60)
Glucose, Bld: 104 mg/dL — ABNORMAL HIGH (ref 70–99)
Potassium: 4.6 mmol/L (ref 3.5–5.1)
Sodium: 139 mmol/L (ref 135–145)
Total Bilirubin: 0.7 mg/dL (ref 0.3–1.2)
Total Protein: 6.7 g/dL (ref 6.5–8.1)

## 2018-12-24 LAB — PROTIME-INR
INR: 1 (ref 0.8–1.2)
Prothrombin Time: 12.8 seconds (ref 11.4–15.2)

## 2018-12-24 LAB — SURGICAL PCR SCREEN
MRSA, PCR: NEGATIVE
Staphylococcus aureus: NEGATIVE

## 2018-12-24 NOTE — Progress Notes (Signed)
Per Claiborne Billings at Dr. Rushie Nyhan 's office pt. Does not need a repeat back xray since previous preop 11-29-18.

## 2018-12-24 NOTE — Progress Notes (Signed)
PCP - [facility] Cardiologist - Dr. Curt Bears  Clearance 12-21-18 epic  Chest x-ray -  EKG - 11-29-18 Stress Test -  ECHO -  Cardiac Cath -   Sleep Study -  CPAP -   Fasting Blood Sugar -  Checks Blood Sugar _____ times a day  Blood Thinner Instructions:hold elequis 3 days  prior  per Dr. Curt Bears Aspirin Instructions: Last Dose:  Anesthesia review  Intermediate risk cardiac clearance in epic   Patient denies shortness of breath, fever, cough and chest pain at PAT appointment   NONE   Patient verbalized understanding of instructions that were given to them at the PAT appointment. Patient was also instructed that they will need to review over the PAT instructions again at home before surgery.

## 2018-12-25 ENCOUNTER — Other Ambulatory Visit (HOSPITAL_COMMUNITY)
Admission: RE | Admit: 2018-12-25 | Discharge: 2018-12-25 | Disposition: A | Discharge status: Home / Self Care | Payer: Medicare Other | Source: Ambulatory Visit | Attending: Orthopedic Surgery | Admitting: Orthopedic Surgery

## 2018-12-25 DIAGNOSIS — Z20828 Contact with and (suspected) exposure to other viral communicable diseases: Secondary | ICD-10-CM | POA: Insufficient documentation

## 2018-12-25 DIAGNOSIS — Z01812 Encounter for preprocedural laboratory examination: Secondary | ICD-10-CM | POA: Diagnosis not present

## 2018-12-26 LAB — NOVEL CORONAVIRUS, NAA (HOSP ORDER, SEND-OUT TO REF LAB; TAT 18-24 HRS): SARS-CoV-2, NAA: NOT DETECTED

## 2018-12-27 NOTE — Progress Notes (Signed)
Anesthesia Chart Review   Case: [id number] Date/Time: 12/29/18 0915   Procedure: Decompressive lumbar laminectomy at L3-L4 (N/A ) - 133min   Anesthesia type: General   Pre-op diagnosis: spinal stenosis with complete block at L3-L4   Location: [facility] 06 / [facility]   Surgeon: Latanya Maudlin, MD      DISCUSSION:68 y.o. current some day smoker (47 pack years) with h/o GERD, HLD, A-fib, spinal stenosis scheduled for above procedure 12/29/2018 with Dr. Latanya Maudlin.   Pt last seen by cardiologist, Dr. Allegra Lai, 12/21/2018.  Per OV note, "Preoperative evaluation: Plan for L-spine surgery.  This is an intermediate risk procedure.  He would at intermediate risk.  Would hold Eliquis for 3 days prior to the operation and restart as soon as possible per surgical recommendations."  Anticipate pt can proceed with planned procedure barring acute status change.   VS: BP (!) 169/84   Pulse 80   Temp 36.9 C (Oral)   Resp 16   Ht 5\' 10"  (1.778 m)   Wt 102.3 kg   SpO2 99%   BMI 32.36 kg/m   PROVIDERS: Mikey Kirschner, MD is PCP   Constance Haw, MD is EP LABS: Labs reviewed: Acceptable for surgery. (all labs ordered are listed, but only abnormal results are displayed)  Labs Reviewed  COMPREHENSIVE METABOLIC PANEL - Abnormal; Notable for the following components:      Result Value   Glucose, Bld 104 (*)    All other components within normal limits  SURGICAL PCR SCREEN  APTT  CBC WITH DIFFERENTIAL/PLATELET  PROTIME-INR  TYPE AND SCREEN     IMAGES:   EKG: 11/29/2018 Rate 56 bpm Sinus bradycardia  Otherwise normal ECG   CV: Echo 03/04/2016 Study Conclusions  - Left ventricle: The cavity size was normal. Wall thickness was   increased in a pattern of mild LVH. The estimated ejection   fraction was 55%. Wall motion was normal; there were no regional   wall motion abnormalities. The study is not technically   sufficient to allow evaluation of LV diastolic function. -  Aortic valve: Trileaflet; moderately calcified leaflets.   Noncoronary cusp mobility was severely restricted. There was mild   stenosis. Aortic valve area approximately 1.5-1.7 cm^2 by   planimetry. - Aortic root: The aortic root was mildly ectatic. - Mitral valve: Calcified annulus. There was trivial regurgitation. - Left atrium: The atrium was mildly dilated. - Right atrium: The atrium was mildly to moderately dilated. - Atrial septum: No defect or patent foramen ovale was identified. - Tricuspid valve: There was trivial regurgitation. - Pulmonary arteries: Systolic pressure could not be accurately   estimated. - Pericardium, extracardiac: There was no pericardial effusion.  Impressions:  - Mild LVH with LVEF approximately 55%. Indeterminate diastolic   function in the setting of atrial fibrillation. Mild left atrial   enlargement. Mildly calcified mitral annulus with trivial mitral   regurgitation. Mild calcific aortic stenosis as outlined above.   Mildly ectatic aortic root. Trivial tricuspid regurgitation.  Past Medical History:  Diagnosis Date  . Anxiety   . Arthritis   . Atrial fibrillation ([place])   . Basal cell carcinoma   . Depression   . GERD (gastroesophageal reflux disease)   . Hyperlipidemia   . Squamous carcinoma     Past Surgical History:  Procedure Laterality Date  . ATRIAL FIBRILLATION ABLATION  07/22/2016  . ATRIAL FIBRILLATION ABLATION N/A 07/22/2016   Procedure: Atrial Fibrillation Ablation;  Surgeon: Will Meredith Leeds, MD;  Location: [place] [facility];  Service: Cardiovascular;  Laterality: N/A;  . BASAL CELL CARCINOMA EXCISION     forehead; top of scalp  . BIOPSY  05/27/2017   Procedure: BIOPSY;  Surgeon: [name], MD;  [facility];  Service: Endoscopy;;  right colon, left colon;  . CARDIOVERSION N/A 04/07/2016   Procedure: CARDIOVERSION;  Surgeon: Satira Sark, MD;  [facility];  Service: Cardiovascular;  Laterality: N/A;   . COLECTOMY  1990s   removal of section due to strangulated bowel.  . COLONOSCOPY N/A 05/30/2013   Procedure: COLONOSCOPY;  Surgeon: [name], MD;  [facility];  Service: Endoscopy;  Laterality: N/A;  8:45 AM  . COLONOSCOPY    . COLONOSCOPY N/A 05/27/2017   Procedure: COLONOSCOPY;  Surgeon: [name], MD;  [facility];  Service: Endoscopy;  Laterality: N/A;  1:00pm  . INGUINAL HERNIA REPAIR Right 1990s  . SHOULDER ARTHROSCOPY WITH ROTATOR CUFF REPAIR AND SUBACROMIAL DECOMPRESSION Left 05/15/2016   Procedure: LEFT SHOULDER ARTHROSCOPY WITH ROTATOR CUFF REPAIR AND SUBACROMIAL DECOMPRESSION AND DISTAL CLAVICLE RESECTION;  Surgeon: [name], MD;  Location: [place];  Service: Orthopedics;  Laterality: Left;  requests 2hrs  . SHOULDER SURGERY Right   . SQUAMOUS CELL CARCINOMA EXCISION     scalp  . TONSILLECTOMY      MEDICATIONS: . acetaminophen (TYLENOL) 500 MG tablet  . apixaban (ELIQUIS) 5 MG TABS tablet  . citalopram (CELEXA) 20 MG tablet  . ibuprofen (ADVIL,MOTRIN) 200 MG tablet  . levocetirizine (XYZAL) 5 MG tablet  . Melatonin 10 MG TABS  . Multiple Vitamin (MULTIVITAMIN) tablet  . Naphazoline-Pheniramine (ALLERGY EYE OP)  . simvastatin (ZOCOR) 20 MG tablet   No current facility-administered medications for this encounter.     Maia Plan [facility] Pre-Surgical Testing [phone] 12/27/18 2:30 PM

## 2018-12-27 NOTE — Anesthesia Preprocedure Evaluation (Addendum)
Anesthesia Evaluation  Patient identified by MRN, date of birth, ID band Patient awake    Reviewed: Allergy & Precautions, NPO status , Patient's Chart, lab work & pertinent test results  Airway Mallampati: II  TM Distance: >3 FB Neck ROM: Full    Dental no notable dental hx.    Pulmonary neg pulmonary ROS, Current Smoker,    Pulmonary exam normal breath sounds clear to auscultation       Cardiovascular hypertension, Pt. on medications Normal cardiovascular exam+ dysrhythmias  Rhythm:Regular Rate:Normal     Neuro/Psych Anxiety Depression negative neurological ROS  negative psych ROS   GI/Hepatic Neg liver ROS, GERD  ,  Endo/Other  negative endocrine ROS  Renal/GU negative Renal ROS  negative genitourinary   Musculoskeletal  (+) Arthritis , Osteoarthritis,    Abdominal (+) + obese,   Peds negative pediatric ROS (+)  Hematology negative hematology ROS (+)   Anesthesia Other Findings   Reproductive/Obstetrics negative OB ROS                            Anesthesia Physical Anesthesia Plan  ASA: III  Anesthesia Plan: General   Post-op Pain Management:    Induction: Intravenous  PONV Risk Score and Plan: 1 and Ondansetron and Treatment may vary due to age or medical condition  Airway Management Planned: Oral ETT  Additional Equipment:   Intra-op Plan:   Post-operative Plan: Extubation in OR  Informed Consent: I have reviewed the patients History and Physical, chart, labs and discussed the procedure including the risks, benefits and alternatives for the proposed anesthesia with the patient or authorized representative who has indicated his/her understanding and acceptance.     Dental advisory given  Plan Discussed with: CRNA  Anesthesia Plan Comments: (See PAT note 12/24/2018, Konrad Felix, PA-C)       Anesthesia Quick Evaluation

## 2018-12-28 MED ORDER — BUPIVACAINE LIPOSOME 1.3 % IJ SUSP
20.0000 mL | INTRAMUSCULAR | Status: AC
  Filled 2018-12-28: qty 20

## 2018-12-29 ENCOUNTER — Ambulatory Visit (HOSPITAL_COMMUNITY): Payer: Medicare Other

## 2018-12-29 ENCOUNTER — Observation Stay (HOSPITAL_COMMUNITY)
Admission: RE | Admit: 2018-12-29 | Discharge: 2018-12-30 | Disposition: A | Discharge status: Home / Self Care | Payer: Medicare Other | Attending: Orthopedic Surgery | Admitting: Orthopedic Surgery

## 2018-12-29 ENCOUNTER — Encounter (HOSPITAL_COMMUNITY)
Admission: RE | Disposition: A | Discharge status: Home / Self Care | Payer: Self-pay | Source: Home / Self Care | Attending: Orthopedic Surgery

## 2018-12-29 ENCOUNTER — Ambulatory Visit (HOSPITAL_COMMUNITY): Payer: Medicare Other | Admitting: Physician Assistant

## 2018-12-29 ENCOUNTER — Other Ambulatory Visit: Payer: Self-pay

## 2018-12-29 ENCOUNTER — Ambulatory Visit (HOSPITAL_COMMUNITY): Payer: Medicare Other | Admitting: Certified Registered Nurse Anesthetist

## 2018-12-29 ENCOUNTER — Encounter (HOSPITAL_COMMUNITY): Payer: Self-pay

## 2018-12-29 DIAGNOSIS — M4126 Other idiopathic scoliosis, lumbar region: Secondary | ICD-10-CM | POA: Diagnosis not present

## 2018-12-29 DIAGNOSIS — Z981 Arthrodesis status: Secondary | ICD-10-CM | POA: Diagnosis not present

## 2018-12-29 DIAGNOSIS — E785 Hyperlipidemia, unspecified: Secondary | ICD-10-CM | POA: Diagnosis not present

## 2018-12-29 DIAGNOSIS — F419 Anxiety disorder, unspecified: Secondary | ICD-10-CM | POA: Insufficient documentation

## 2018-12-29 DIAGNOSIS — F17290 Nicotine dependence, other tobacco product, uncomplicated: Secondary | ICD-10-CM | POA: Insufficient documentation

## 2018-12-29 DIAGNOSIS — K219 Gastro-esophageal reflux disease without esophagitis: Secondary | ICD-10-CM | POA: Diagnosis not present

## 2018-12-29 DIAGNOSIS — I4891 Unspecified atrial fibrillation: Secondary | ICD-10-CM | POA: Insufficient documentation

## 2018-12-29 DIAGNOSIS — F329 Major depressive disorder, single episode, unspecified: Secondary | ICD-10-CM | POA: Diagnosis not present

## 2018-12-29 DIAGNOSIS — Z79899 Other long term (current) drug therapy: Secondary | ICD-10-CM | POA: Insufficient documentation

## 2018-12-29 DIAGNOSIS — M1990 Unspecified osteoarthritis, unspecified site: Secondary | ICD-10-CM | POA: Diagnosis not present

## 2018-12-29 DIAGNOSIS — Z7901 Long term (current) use of anticoagulants: Secondary | ICD-10-CM | POA: Diagnosis not present

## 2018-12-29 DIAGNOSIS — Z85828 Personal history of other malignant neoplasm of skin: Secondary | ICD-10-CM | POA: Insufficient documentation

## 2018-12-29 DIAGNOSIS — I10 Essential (primary) hypertension: Secondary | ICD-10-CM | POA: Diagnosis not present

## 2018-12-29 DIAGNOSIS — M48061 Spinal stenosis, lumbar region without neurogenic claudication: Secondary | ICD-10-CM | POA: Diagnosis not present

## 2018-12-29 DIAGNOSIS — Z419 Encounter for procedure for purposes other than remedying health state, unspecified: Secondary | ICD-10-CM

## 2018-12-29 DIAGNOSIS — M4125 Other idiopathic scoliosis, thoracolumbar region: Secondary | ICD-10-CM | POA: Diagnosis not present

## 2018-12-29 DIAGNOSIS — M48062 Spinal stenosis, lumbar region with neurogenic claudication: Secondary | ICD-10-CM | POA: Diagnosis present

## 2018-12-29 HISTORY — PX: LUMBAR LAMINECTOMY/DECOMPRESSION MICRODISCECTOMY: SHX5026

## 2018-12-29 LAB — TYPE AND SCREEN
ABO/RH(D): A NEG
Antibody Screen: NEGATIVE

## 2018-12-29 SURGERY — LUMBAR LAMINECTOMY/DECOMPRESSION MICRODISCECTOMY 1 LEVEL
Anesthesia: General | Site: Back

## 2018-12-29 MED ORDER — ONDANSETRON HCL 4 MG/2ML IJ SOLN
INTRAMUSCULAR | Status: DC | PRN
  Administered 2018-12-29: 4 mg via INTRAVENOUS

## 2018-12-29 MED ORDER — LIDOCAINE 2% (20 MG/ML) 5 ML SYRINGE
INTRAMUSCULAR | Status: AC
  Filled 2018-12-29: qty 5

## 2018-12-29 MED ORDER — THROMBIN (RECOMBINANT) 5000 UNITS EX SOLR
CUTANEOUS | Status: AC
  Filled 2018-12-29: qty 5000

## 2018-12-29 MED ORDER — LIDOCAINE 2% (20 MG/ML) 5 ML SYRINGE
INTRAMUSCULAR | Status: DC | PRN
  Administered 2018-12-29: 100 mg via INTRAVENOUS

## 2018-12-29 MED ORDER — METHOCARBAMOL 500 MG PO TABS: 500.0000 mg | ORAL_TABLET | Freq: Four times a day (QID) | ORAL | 0 refills | Status: DC | PRN

## 2018-12-29 MED ORDER — BUPIVACAINE-EPINEPHRINE 0.5% -1:200000 IJ SOLN
INTRAMUSCULAR | Status: DC | PRN
  Administered 2018-12-29: 18 mL

## 2018-12-29 MED ORDER — THROMBIN (RECOMBINANT) 5000 UNITS EX SOLR
CUTANEOUS | Status: DC | PRN
  Administered 2018-12-29: 5000 [IU] via TOPICAL

## 2018-12-29 MED ORDER — PHENYLEPHRINE 40 MCG/ML (10ML) SYRINGE FOR IV PUSH (FOR BLOOD PRESSURE SUPPORT)
PREFILLED_SYRINGE | INTRAVENOUS | Status: AC
  Filled 2018-12-29: qty 10

## 2018-12-29 MED ORDER — MIDAZOLAM HCL 5 MG/5ML IJ SOLN
INTRAMUSCULAR | Status: DC | PRN
  Administered 2018-12-29 (×2): 1 mg via INTRAVENOUS

## 2018-12-29 MED ORDER — PROPOFOL 10 MG/ML IV BOLUS
INTRAVENOUS | Status: AC
  Filled 2018-12-29: qty 20

## 2018-12-29 MED ORDER — PHENOL 1.4 % MT LIQD: 1.0000 | OROMUCOSAL | Status: DC | PRN

## 2018-12-29 MED ORDER — SODIUM CHLORIDE 0.9 % IV SOLN
INTRAVENOUS | Status: AC
  Filled 2018-12-29: qty 500000

## 2018-12-29 MED ORDER — LEVOCETIRIZINE DIHYDROCHLORIDE 5 MG PO TABS: 5.0000 mg | ORAL_TABLET | Freq: Every day | ORAL | Status: DC

## 2018-12-29 MED ORDER — SUCCINYLCHOLINE CHLORIDE 200 MG/10ML IV SOSY
PREFILLED_SYRINGE | INTRAVENOUS | Status: AC
  Filled 2018-12-29: qty 10

## 2018-12-29 MED ORDER — ACETAMINOPHEN 650 MG RE SUPP: 650.0000 mg | RECTAL | Status: DC | PRN

## 2018-12-29 MED ORDER — CEFAZOLIN SODIUM-DEXTROSE 2-4 GM/100ML-% IV SOLN
2.0000 g | INTRAVENOUS | Status: AC
  Administered 2018-12-29: 2 g via INTRAVENOUS
  Filled 2018-12-29: qty 100

## 2018-12-29 MED ORDER — CEFAZOLIN SODIUM-DEXTROSE 1-4 GM/50ML-% IV SOLN
1.0000 g | Freq: Three times a day (TID) | INTRAVENOUS | Status: AC
  Administered 2018-12-29 – 2018-12-30 (×3): 1 g via INTRAVENOUS
  Filled 2018-12-29 (×3): qty 50

## 2018-12-29 MED ORDER — HYDROCODONE-ACETAMINOPHEN 7.5-325 MG PO TABS
2.0000 | ORAL_TABLET | ORAL | Status: DC | PRN
  Administered 2018-12-29: 2 via ORAL
  Filled 2018-12-29: qty 2

## 2018-12-29 MED ORDER — ONDANSETRON HCL 4 MG/2ML IJ SOLN
INTRAMUSCULAR | Status: AC
  Filled 2018-12-29: qty 2

## 2018-12-29 MED ORDER — BACITRACIN ZINC 500 UNIT/GM EX OINT
TOPICAL_OINTMENT | CUTANEOUS | Status: AC
  Filled 2018-12-29: qty 28.35

## 2018-12-29 MED ORDER — EPHEDRINE SULFATE-NACL 50-0.9 MG/10ML-% IV SOSY
PREFILLED_SYRINGE | INTRAVENOUS | Status: DC | PRN
  Administered 2018-12-29 (×4): 10 mg via INTRAVENOUS

## 2018-12-29 MED ORDER — SODIUM CHLORIDE 0.9 % IV SOLN
INTRAVENOUS | Status: DC | PRN
  Administered 2018-12-29: 11:00:00 500 mL

## 2018-12-29 MED ORDER — ROCURONIUM BROMIDE 10 MG/ML (PF) SYRINGE
PREFILLED_SYRINGE | INTRAVENOUS | Status: AC
  Filled 2018-12-29: qty 10

## 2018-12-29 MED ORDER — SUGAMMADEX SODIUM 500 MG/5ML IV SOLN
INTRAVENOUS | Status: DC | PRN
  Administered 2018-12-29: 200 mg via INTRAVENOUS

## 2018-12-29 MED ORDER — OXYCODONE HCL 5 MG/5ML PO SOLN: 5.0000 mg | Freq: Once | ORAL | Status: DC | PRN

## 2018-12-29 MED ORDER — DEXAMETHASONE SODIUM PHOSPHATE 10 MG/ML IJ SOLN
INTRAMUSCULAR | Status: DC | PRN
  Administered 2018-12-29: 10 mg via INTRAVENOUS

## 2018-12-29 MED ORDER — PHENYLEPHRINE 40 MCG/ML (10ML) SYRINGE FOR IV PUSH (FOR BLOOD PRESSURE SUPPORT)
PREFILLED_SYRINGE | INTRAVENOUS | Status: DC | PRN
  Administered 2018-12-29: 40 ug via INTRAVENOUS
  Administered 2018-12-29: 80 ug via INTRAVENOUS

## 2018-12-29 MED ORDER — HYDROCODONE-ACETAMINOPHEN 5-325 MG PO TABS
1.0000 | ORAL_TABLET | ORAL | Status: DC | PRN
  Administered 2018-12-29 – 2018-12-30 (×2): 1 via ORAL
  Filled 2018-12-29 (×2): qty 1

## 2018-12-29 MED ORDER — BISACODYL 5 MG PO TBEC: 5.0000 mg | DELAYED_RELEASE_TABLET | Freq: Every day | ORAL | Status: DC | PRN

## 2018-12-29 MED ORDER — ESMOLOL HCL 100 MG/10ML IV SOLN
INTRAVENOUS | Status: DC | PRN
  Administered 2018-12-29: 10 mg via INTRAVENOUS

## 2018-12-29 MED ORDER — ONDANSETRON HCL 4 MG PO TABS: 4.0000 mg | ORAL_TABLET | Freq: Four times a day (QID) | ORAL | Status: DC | PRN

## 2018-12-29 MED ORDER — OXYCODONE HCL 5 MG PO TABS: 5.0000 mg | ORAL_TABLET | Freq: Once | ORAL | Status: DC | PRN

## 2018-12-29 MED ORDER — FLEET ENEMA 7-19 GM/118ML RE ENEM: 1.0000 | ENEMA | Freq: Once | RECTAL | Status: DC | PRN

## 2018-12-29 MED ORDER — FENTANYL CITRATE (PF) 250 MCG/5ML IJ SOLN
INTRAMUSCULAR | Status: AC
  Filled 2018-12-29: qty 5

## 2018-12-29 MED ORDER — HYDROMORPHONE HCL 1 MG/ML IJ SOLN: 0.2500 mg | INTRAMUSCULAR | Status: DC | PRN

## 2018-12-29 MED ORDER — METHOCARBAMOL 500 MG PO TABS
500.0000 mg | ORAL_TABLET | Freq: Four times a day (QID) | ORAL | Status: DC | PRN
  Administered 2018-12-29 – 2018-12-30 (×2): 500 mg via ORAL
  Filled 2018-12-29 (×2): qty 1

## 2018-12-29 MED ORDER — EPHEDRINE 5 MG/ML INJ
INTRAVENOUS | Status: AC
  Filled 2018-12-29: qty 10

## 2018-12-29 MED ORDER — METHOCARBAMOL 500 MG IVPB - SIMPLE MED
500.0000 mg | Freq: Four times a day (QID) | INTRAVENOUS | Status: DC | PRN
  Filled 2018-12-29: qty 50

## 2018-12-29 MED ORDER — LACTATED RINGERS IV SOLN
INTRAVENOUS | Status: DC | PRN
  Administered 2018-12-29: 11:00:00 via INTRAVENOUS

## 2018-12-29 MED ORDER — BACITRACIN ZINC 500 UNIT/GM EX OINT
TOPICAL_OINTMENT | CUTANEOUS | Status: DC | PRN
  Administered 2018-12-29: 1 via TOPICAL

## 2018-12-29 MED ORDER — FENTANYL CITRATE (PF) 100 MCG/2ML IJ SOLN
INTRAMUSCULAR | Status: DC | PRN
  Administered 2018-12-29 (×3): 50 ug via INTRAVENOUS
  Administered 2018-12-29: 100 ug via INTRAVENOUS

## 2018-12-29 MED ORDER — BUPIVACAINE LIPOSOME 1.3 % IJ SUSP
INTRAMUSCULAR | Status: DC | PRN
  Administered 2018-12-29: 20 mL

## 2018-12-29 MED ORDER — BUPIVACAINE-EPINEPHRINE 0.5% -1:200000 IJ SOLN
INTRAMUSCULAR | Status: AC
  Filled 2018-12-29: qty 1

## 2018-12-29 MED ORDER — LORATADINE 10 MG PO TABS
10.0000 mg | ORAL_TABLET | Freq: Every day | ORAL | Status: DC
  Administered 2018-12-30: 10:00:00 10 mg via ORAL
  Filled 2018-12-29: qty 1

## 2018-12-29 MED ORDER — LACTATED RINGERS IV SOLN
INTRAVENOUS | Status: DC
  Administered 2018-12-29: 16:00:00 via INTRAVENOUS

## 2018-12-29 MED ORDER — MEPERIDINE HCL 50 MG/ML IJ SOLN: 6.2500 mg | INTRAMUSCULAR | Status: DC | PRN

## 2018-12-29 MED ORDER — ACETAMINOPHEN 325 MG PO TABS: 650.0000 mg | ORAL_TABLET | ORAL | Status: DC | PRN

## 2018-12-29 MED ORDER — HYDROMORPHONE HCL 1 MG/ML IJ SOLN: 0.5000 mg | INTRAMUSCULAR | Status: DC | PRN

## 2018-12-29 MED ORDER — LACTATED RINGERS IV SOLN
INTRAVENOUS | Status: DC
  Administered 2018-12-29: 08:00:00 via INTRAVENOUS

## 2018-12-29 MED ORDER — ACETAMINOPHEN 500 MG PO TABS: 500.0000 mg | ORAL_TABLET | Freq: Four times a day (QID) | ORAL | Status: DC | PRN

## 2018-12-29 MED ORDER — MIDAZOLAM HCL 2 MG/2ML IJ SOLN
INTRAMUSCULAR | Status: AC
  Filled 2018-12-29: qty 2

## 2018-12-29 MED ORDER — CHLORHEXIDINE GLUCONATE 4 % EX LIQD: 60.0000 mL | Freq: Once | CUTANEOUS | Status: DC

## 2018-12-29 MED ORDER — POLYETHYLENE GLYCOL 3350 17 G PO PACK: 17.0000 g | PACK | Freq: Every day | ORAL | Status: DC | PRN

## 2018-12-29 MED ORDER — PROMETHAZINE HCL 25 MG/ML IJ SOLN: 6.2500 mg | INTRAMUSCULAR | Status: DC | PRN

## 2018-12-29 MED ORDER — SUCCINYLCHOLINE CHLORIDE 200 MG/10ML IV SOSY
PREFILLED_SYRINGE | INTRAVENOUS | Status: DC | PRN
  Administered 2018-12-29: 120 mg via INTRAVENOUS

## 2018-12-29 MED ORDER — HYDROCODONE-ACETAMINOPHEN 7.5-325 MG PO TABS: 1.0000 | ORAL_TABLET | Freq: Four times a day (QID) | ORAL | 0 refills | Status: DC | PRN

## 2018-12-29 MED ORDER — ONDANSETRON HCL 4 MG/2ML IJ SOLN: 4.0000 mg | Freq: Four times a day (QID) | INTRAMUSCULAR | Status: DC | PRN

## 2018-12-29 MED ORDER — MENTHOL 3 MG MT LOZG: 1.0000 | LOZENGE | OROMUCOSAL | Status: DC | PRN

## 2018-12-29 MED ORDER — PROPOFOL 10 MG/ML IV BOLUS
INTRAVENOUS | Status: DC | PRN
  Administered 2018-12-29: 150 mg via INTRAVENOUS

## 2018-12-29 MED ORDER — ROCURONIUM BROMIDE 50 MG/5ML IV SOSY
PREFILLED_SYRINGE | INTRAVENOUS | Status: DC | PRN
  Administered 2018-12-29: 40 mg via INTRAVENOUS
  Administered 2018-12-29: 10 mg via INTRAVENOUS
  Administered 2018-12-29 (×2): 5 mg via INTRAVENOUS

## 2018-12-29 MED ORDER — SUGAMMADEX SODIUM 500 MG/5ML IV SOLN
INTRAVENOUS | Status: AC
  Filled 2018-12-29: qty 5

## 2018-12-29 MED ORDER — DEXAMETHASONE SODIUM PHOSPHATE 10 MG/ML IJ SOLN
INTRAMUSCULAR | Status: AC
  Filled 2018-12-29: qty 1

## 2018-12-29 SURGICAL SUPPLY — 44 items
AGENT HMST SPONGE THK3/8 (HEMOSTASIS) ×1
BAG DECANTER FOR FLEXI CONT (MISCELLANEOUS) ×3 IMPLANT
BAG SPEC THK2 15X12 ZIP CLS (MISCELLANEOUS)
BAG ZIPLOCK 12X15 (MISCELLANEOUS) IMPLANT
CLEANER TIP ELECTROSURG 2X2 (MISCELLANEOUS) ×3 IMPLANT
CLOSURE WOUND 1/2 X4 (GAUZE/BANDAGES/DRESSINGS)
COVER SURGICAL LIGHT HANDLE (MISCELLANEOUS) ×3 IMPLANT
COVER WAND RF STERILE (DRAPES) IMPLANT
DRAPE MICROSCOPE LEICA (MISCELLANEOUS) ×3 IMPLANT
DRAPE POUCH INSTRU U-SHP 10X18 (DRAPES) ×3 IMPLANT
DRAPE SHEET LG 3/4 BI-LAMINATE (DRAPES) ×3 IMPLANT
DRAPE SURG 17X11 SM STRL (DRAPES) ×3 IMPLANT
DRSG ADAPTIC 3X8 NADH LF (GAUZE/BANDAGES/DRESSINGS) ×3 IMPLANT
DRSG PAD ABDOMINAL 8X10 ST (GAUZE/BANDAGES/DRESSINGS) ×4 IMPLANT
DURAPREP 26ML APPLICATOR (WOUND CARE) ×3 IMPLANT
ELECT BLADE TIP CTD 4 INCH (ELECTRODE) ×3 IMPLANT
ELECT REM PT RETURN 15FT ADLT (MISCELLANEOUS) ×3 IMPLANT
GAUZE SPONGE 4X4 12PLY STRL (GAUZE/BANDAGES/DRESSINGS) ×3 IMPLANT
GLOVE BIOGEL PI IND STRL 8.5 (GLOVE) ×1 IMPLANT
GLOVE BIOGEL PI INDICATOR 8.5 (GLOVE) ×2
GLOVE ECLIPSE 8.0 STRL XLNG CF (GLOVE) ×3 IMPLANT
GOWN STRL REUS W/TWL XL LVL3 (GOWN DISPOSABLE) ×3 IMPLANT
HEMOSTAT SPONGE AVITENE ULTRA (HEMOSTASIS) ×3 IMPLANT
KIT BASIN OR (CUSTOM PROCEDURE TRAY) ×3 IMPLANT
KIT TURNOVER KIT A (KITS) IMPLANT
MANIFOLD NEPTUNE II (INSTRUMENTS) ×3 IMPLANT
NDL SPNL 18GX3.5 QUINCKE PK (NEEDLE) ×2 IMPLANT
NEEDLE HYPO 22GX1.5 SAFETY (NEEDLE) ×3 IMPLANT
NEEDLE SPNL 18GX3.5 QUINCKE PK (NEEDLE) ×6 IMPLANT
PACK LAMINECTOMY ORTHO (CUSTOM PROCEDURE TRAY) ×3 IMPLANT
PATTIES SURGICAL .5 X.5 (GAUZE/BANDAGES/DRESSINGS) IMPLANT
PATTIES SURGICAL .75X.75 (GAUZE/BANDAGES/DRESSINGS) ×3 IMPLANT
PATTIES SURGICAL 1X1 (DISPOSABLE) ×3 IMPLANT
SPONGE LAP 4X18 RFD (DISPOSABLE) IMPLANT
STAPLER VISISTAT 35W (STAPLE) ×3 IMPLANT
STRIP CLOSURE SKIN 1/2X4 (GAUZE/BANDAGES/DRESSINGS) ×1 IMPLANT
SUT VIC AB 1 CT1 27 (SUTURE) ×6
SUT VIC AB 1 CT1 27XBRD ANTBC (SUTURE) ×2 IMPLANT
SUT VIC AB 2-0 CT1 27 (SUTURE) ×6
SUT VIC AB 2-0 CT1 TAPERPNT 27 (SUTURE) ×2 IMPLANT
SYR 20ML LL LF (SYRINGE) ×6 IMPLANT
TAPE CLOTH SURG 6X10 WHT LF (GAUZE/BANDAGES/DRESSINGS) ×2 IMPLANT
TOWEL OR 17X26 10 PK STRL BLUE (TOWEL DISPOSABLE) ×3 IMPLANT
TOWEL OR NON WOVEN STRL DISP B (DISPOSABLE) ×3 IMPLANT

## 2018-12-29 NOTE — Discharge Instructions (Signed)

## 2018-12-29 NOTE — H&P (Signed)
Jon Jackson is an 68 y.o. male.   Chief Complaint: Pain and weakness in both legs. HPI: He complains of progressive pain and weakness in both legs.  Past Medical History:  Diagnosis Date  . Anxiety   . Arthritis   . Atrial fibrillation ([place])   . Basal cell carcinoma   . Depression   . GERD (gastroesophageal reflux disease)   . Hyperlipidemia   . Squamous carcinoma     Past Surgical History:  Procedure Laterality Date  . ATRIAL FIBRILLATION ABLATION  07/22/2016  . ATRIAL FIBRILLATION ABLATION N/A 07/22/2016   Procedure: Atrial Fibrillation Ablation;  Surgeon: Will Meredith Leeds, MD;  Location: [place] [facility];  Service: Cardiovascular;  Laterality: N/A;  . BASAL CELL CARCINOMA EXCISION     forehead; top of scalp  . BIOPSY  05/27/2017   Procedure: BIOPSY;  Surgeon: [name], MD;  [facility];  Service: Endoscopy;;  right colon, left colon;  . CARDIOVERSION N/A 04/07/2016   Procedure: CARDIOVERSION;  Surgeon: Satira Sark, MD;  [facility];  Service: Cardiovascular;  Laterality: N/A;  . COLECTOMY  1990s   removal of section due to strangulated bowel.  . COLONOSCOPY N/A 05/30/2013   Procedure: COLONOSCOPY;  Surgeon: [name], MD;  [facility];  Service: Endoscopy;  Laterality: N/A;  8:45 AM  . COLONOSCOPY    . COLONOSCOPY N/A 05/27/2017   Procedure: COLONOSCOPY;  Surgeon: [name], MD;  [facility];  Service: Endoscopy;  Laterality: N/A;  1:00pm  . INGUINAL HERNIA REPAIR Right 1990s  . SHOULDER ARTHROSCOPY WITH ROTATOR CUFF REPAIR AND SUBACROMIAL DECOMPRESSION Left 05/15/2016   Procedure: LEFT SHOULDER ARTHROSCOPY WITH ROTATOR CUFF REPAIR AND SUBACROMIAL DECOMPRESSION AND DISTAL CLAVICLE RESECTION;  Surgeon: [name], MD;  Location: [place];  Service: Orthopedics;  Laterality: Left;  requests 2hrs  . SHOULDER SURGERY Right   . SQUAMOUS CELL CARCINOMA EXCISION     scalp  . TONSILLECTOMY      Family History   Problem Relation Age of Onset  . Colon cancer Brother 27  . Throat cancer Brother   . Breast cancer Mother    Social History:  reports that he has been smoking cigars. He has a 47.00 pack-year smoking history. He has never used smokeless tobacco. He reports current alcohol use of about 12.0 standard drinks of alcohol per week. He reports that he does not use drugs.  Allergies: No Known Allergies  Medications Prior to Admission  Medication Sig Dispense Refill  . acetaminophen (TYLENOL) 500 MG tablet Take 1,000-1,500 mg by mouth every 6 (six) hours as needed for moderate pain or headache.     Marland Kitchen apixaban (ELIQUIS) 5 MG TABS tablet Take 1 tablet (5 mg total) by mouth 2 (two) times daily. 180 tablet 3  . citalopram (CELEXA) 20 MG tablet Take 1 tablet (20 mg total) by mouth daily. 90 tablet 1  . ibuprofen (ADVIL,MOTRIN) 200 MG tablet Take 400-600 mg by mouth every 6 (six) hours as needed for headache or moderate pain.     Marland Kitchen levocetirizine (XYZAL) 5 MG tablet Take 5 mg by mouth daily.    . Melatonin 10 MG TABS Take 10 mg by mouth at bedtime as needed (sleep).    . Multiple Vitamin (MULTIVITAMIN) tablet Take 1 tablet by mouth daily.    [name] Fill (ALLERGY EYE OP) Place 1-2 drops into both eyes 3 (three) times daily as needed (for seasonal allergy/itchy eyes).    Marland Kitchen  simvastatin (ZOCOR) 20 MG tablet Take 10 mg by mouth daily.      No results found for this or any previous visit (from the past 48 hour(s)). No results found.  Review of Systems  Constitutional: Negative.   HENT: Negative.   Eyes: Negative.   Respiratory: Negative.   Cardiovascular: Negative.   Gastrointestinal: Negative.   Genitourinary: Negative.   Musculoskeletal: Positive for back pain.  Skin: Negative.   Neurological: Negative.   Psychiatric/Behavioral: Negative.     Blood pressure (!) 143/80, pulse 73, temperature 98.8 F (37.1 C), temperature source Oral, resp. rate 18, SpO2 96 %. Physical Exam   Constitutional: He appears well-developed.  HENT:  Head: Normocephalic.  Eyes: Pupils are equal, round, and reactive to light.  Neck: Normal range of motion.  Cardiovascular: Normal rate.  Respiratory: Effort normal.  GI: Soft.  Musculoskeletal: Normal range of motion.  Neurological:  Reflex Scores:      Tricep reflexes are 2+ on the right side and 2+ on the left side.      Bicep reflexes are 2+ on the right side and 2+ on the left side.      Brachioradialis reflexes are 2+ on the right side and 2+ on the left side.      Patellar reflexes are 2+ on the right side and 2+ on the left side.      Achilles reflexes are 2+ on the right side and 2+ on the left side. Weakness in both legs     Assessment/Plan Decompression of the L-3-L-4 Space for a complete Block.  Latanya Maudlin, MD 12/29/2018, 9:32 AM

## 2018-12-29 NOTE — Transfer of Care (Signed)
Immediate Anesthesia Transfer of Care Note  Patient: Jon Jackson  Procedure(s) Performed: Decompressive lumbar laminectomy at L3-L4 (N/A Back)  Patient Location: PACU  Anesthesia Type:General  Level of Consciousness: awake, alert , oriented and patient cooperative  Airway & Oxygen Therapy: Patient Spontanous Breathing and Patient connected to face mask oxygen  Post-op Assessment: Report given to RN and Post -op Vital signs reviewed and stable  Post vital signs: Reviewed and stable  Last Vitals:  Vitals Value Taken Time  BP 131/104 12/29/18 1145  Temp    Pulse 94 12/29/18 1149  Resp 17 12/29/18 1149  SpO2 99 % 12/29/18 1149  Vitals shown include unvalidated device data.  Last Pain:  Vitals:   12/29/18 0805  TempSrc: Oral         Complications: No apparent anesthesia complications

## 2018-12-29 NOTE — Op Note (Signed)
NAME: Jon Jackson, WIEDERKEHR MEDICAL RECORD [id number] ACCOUNT [id number] DATE OF BIRTH:1951/02/19 FACILITY[facility] LOCATION: [facility] PHYSICIAN:Johanthan Kneeland Fransico Setters, MD  OPERATIVE REPORT  DATE OF PROCEDURE:  12/29/2018  SURGEON:  Latanya Maudlin, MD  ASSISTANT:  Ardeen Jourdain, PA  PREOPERATIVE DIAGNOSES: 1.  Severe thoracolumbar scoliosis, idiopathic in type. 2.  Complete block at L3-L4. 3.  Foraminal stenosis involving the L3 and L4 roots bilaterally.  POSTOPERATIVE DIAGNOSES: 1.  Severe thoracolumbar scoliosis, idiopathic in type. 2.  Complete block at L3-L4. 3.  Foraminal stenosis involving the L3 and L4 roots bilaterally.  OPERATION: 1.  Complete decompressive lumbar laminectomy at L3-L4. 2.  Foraminotomies for the L3 and the L4 root bilaterally for foraminal stenosis.  DESCRIPTION OF PROCEDURE:  After sterile prep and draping, appropriate timeout was carried out.  We did not need to mark the back as I was going central.  At this time, 2 needles were placed in the back for localization purposes.  X-ray was taken to  verify the position.  Following that, an incision was made over the L3-L4 space.  Bleeders were identified and cauterized.  The muscle was stripped from the lamina and spinous processes bilaterally. A Kocher clamp was placed on the spinous process of L3.   X-ray was taken to verify the position.  At this time, I then completed the dissection down to the lamina, stripping the muscle from the lamina and spinous processes bilaterally.  The self-retaining McCulloch retractors were inserted.  Good hemostasis  was maintained.  At that particular time, the laminectomy was started.  We went proximally and distally.  I first put an instrument in place to verify the position again deep.  At this time, I noticed the bone was extremely hard.  The canal was extremely  tight at L3-L4.  I did a meticulous slow dissection proximally to remove the bone.  I protected the dura with the  cottonoids.  I utilized the Harrison 4 to make sure the dura was freed up in all directions.  I then inserted the hockey stick and worked  over top of the hockey stick.  I went up all the way proximal to the L3-L4 space.  Then I went distal distal to the L3-L4 space.  I went out wide laterally and decompressed the recesses bilaterally.  We also did foraminotomies.  At this time, we  thoroughly irrigated out the area.  I then took the hockey stick, and I underwent proximal, distal, and out laterally on both sides and made sure we were totally free, and we were.  There were no further blocks.  We thoroughly irrigated out the area,  loosely applied some Gelfoam, and closed the wound in layers in the usual fashion except we left a small deep proximal distal part of the wound open for drainage purposes.  Subcu was closed in the usual fashion, skin with metal staples.  At the beginning  of the case, I injected 20 mL of 0.25% Marcaine with epinephrine into the soft tissue to control bleeding.  At the end of the case, I injected 30 mL of Exparel into the soft tissue.  After the wound was closed, sterile dressings were applied.  He will  be admitted overnight to the hospital for observation.  He did have 2 g of IV Ancef preop.  LN/NUANCE  D:12/29/2018 T:12/29/2018 JOB:[id number]

## 2018-12-29 NOTE — Evaluation (Signed)
Physical Therapy Evaluation Patient Details Name: Jon Jackson MRN: [id number] DOB: [birth year] Today's Date: 12/29/2018   History of Present Illness  68 yo male admitted for decompressive surgery for spinal stenosis with complete block at L3-L4 and Foraminal Stenosis involving the L-3 L-4 Nerve Roots bilaterally.  Clinical Impression  The patient ambulated x 100' using RW. Will assess need for RW next visit. Pt admitted with above diagnosis.  Pt currently with functional limitations due to the deficits listed below (see PT Problem List). Pt will benefit from skilled PT to increase their independence and safety with mobility to allow discharge to the venue listed below.       Follow Up Recommendations No PT follow up    Equipment Recommendations  Rolling walker with 5" wheels(tbd next visit)    Recommendations for Other Services       Precautions / Restrictions Precautions Precautions: Back Precaution Booklet Issued: Yes (comment) Precaution Comments: handout and reviewed log roll to sitting. Restrictions Weight Bearing Restrictions: No      Mobility  Bed Mobility Overal bed mobility: Needs Assistance Bed Mobility: Rolling;Sidelying to Sit;Sit to Sidelying Rolling: Supervision Sidelying to sit: Supervision     Sit to sidelying: Supervision General bed mobility comments: cues for technique  Transfers Overall transfer level: Needs assistance Equipment used: Rolling walker (2 wheeled) Transfers: Sit to/from Stand Sit to Stand: Supervision         General transfer comment: cues for hand placement, pulled on RW to stand  Ambulation/Gait Ambulation/Gait assistance: Supervision Gait Distance (Feet): 100 Feet Assistive device: Rolling walker (2 wheeled) Gait Pattern/deviations: Step-through pattern     General Gait Details: cues for posture  Stairs            Wheelchair Mobility    Modified Rankin (Stroke Patients Only)       Balance                                              Pertinent Vitals/Pain Pain Assessment: 0-10 Pain Score: 8  Pain Location: after ambualtion Pain Descriptors / Indicators: Discomfort;Cramping Pain Intervention(s): Monitored during session;Patient requesting pain meds-RN notified    Home Living Family/patient expects to be discharged to:: Private residence Living Arrangements: Spouse/significant other Available Help at Discharge: Family Type of Home: House Home Access: Stairs to enter   Technical brewer of Steps: 1 Home Layout: One level Home Equipment: None      Prior Function Level of Independence: Independent               Hand Dominance        Extremity/Trunk Assessment   Upper Extremity Assessment Upper Extremity Assessment: Overall WFL for tasks assessed    Lower Extremity Assessment Lower Extremity Assessment: Overall WFL for tasks assessed       Communication      Cognition Arousal/Alertness: Awake/alert Behavior During Therapy: WFL for tasks assessed/performed Overall Cognitive Status: Within Functional Limits for tasks assessed                                        General Comments      Exercises     Assessment/Plan    PT Assessment Patient needs continued PT services  PT Problem List Decreased mobility;Decreased safety awareness;Decreased knowledge of precautions;Decreased  activity tolerance;Pain       PT Treatment Interventions DME instruction;Gait training;Patient/family education;Therapeutic activities    PT Goals (Current goals can be found in the Care Plan section)  Acute Rehab PT Goals Patient Stated Goal: not have pain down my legs PT Goal Formulation: With patient/family Time For Goal Achievement: 01/04/19 Potential to Achieve Goals: Good    Frequency Min 5X/week   Barriers to discharge        Co-evaluation               AM-PAC PT "6 Clicks" Mobility  Outcome Measure Help needed  turning from your back to your side while in a flat bed without using bedrails?: A Little Help needed moving from lying on your back to sitting on the side of a flat bed without using bedrails?: A Little Help needed moving to and from a bed to a chair (including a wheelchair)?: A Little Help needed standing up from a chair using your arms (e.g., wheelchair or bedside chair)?: A Little Help needed to walk in hospital room?: A Little Help needed climbing 3-5 steps with a railing? : A Little 6 Click Score: 18    End of Session   Activity Tolerance: Patient tolerated treatment well Patient left: in bed;with call bell/phone within reach;with family/visitor present Nurse Communication: Mobility status;Patient requests pain meds PT Visit Diagnosis: Difficulty in walking, not elsewhere classified (R26.2)    Time: [id number] PT Time Calculation (min) (ACUTE ONLY): 16 min   Charges:   PT Evaluation $PT Eval Low Complexity: [place] [facility] Acute Rehabilitation Services Pager [phone] Office [phone]   Claretha Cooper 12/29/2018, 4:24 PM

## 2018-12-29 NOTE — Interval H&P Note (Signed)
History and Physical Interval Note:  12/29/2018 9:38 AM  Jon Jackson  has presented today for surgery, with the diagnosis of spinal stenosis with complete block at L3-L4.  The various methods of treatment have been discussed with the patient and family. After consideration of risks, benefits and other options for treatment, the patient has consented to  Procedure(s) with comments: Decompressive lumbar laminectomy at L3-L4 (N/A) - 150min as a surgical intervention.  The patient's history has been reviewed, patient examined, no change in status, stable for surgery.  I have reviewed the patient's chart and labs.  Questions were answered to the patient's satisfaction.     Latanya Maudlin

## 2018-12-29 NOTE — Brief Op Note (Signed)
12/29/2018  11:28 AM  PATIENT:  Jon Jackson  68 y.o. male  PRE-OPERATIVE DIAGNOSIS:  spinal stenosis with complete block at L3-L4  POST-OPERATIVE DIAGNOSIS:  spinal stenosis with complete block at L3-L4 and Foraminal Stenosis involving the L-3 L-4 Nerve Roots bilaterally.  PROCEDURE:  Procedure(s) with comments: Decompressive lumbar laminectomy at L3-L4 (N/A) - 164minfor a COMPLETE BLOCK at L-3-L-4 and Foraminotomies for the above Roots.  SURGEON:  Surgeon(s) and Role:    * Latanya Maudlin, MD - Primary  PHYSICIAN ASSISTANT: Ardeen Jourdain PA  ASSISTANTS:Amber Kings PA   ANESTHESIA:   general  EBL:  50 mL   BLOOD ADMINISTERED:none  DRAINS: none   LOCAL MEDICATIONS USED:  MARCAINE  20cc of 0.25% with Epinephrine at the start of the case and 20cc of Exparel at the end of the Case.   SPECIMEN:  No Specimen  DISPOSITION OF SPECIMEN:  N/A  COUNTS:  YES  TOURNIQUET:  * No tourniquets in log *  DICTATION: .Other Dictation: Dictation Number [id number]  PLAN OF CARE: Admit for overnight observation  PATIENT DISPOSITION:  Stable in the OR   Delay start of Pharmacological VTE agent (>24hrs) due to surgical blood loss or risk of bleeding: yes

## 2018-12-29 NOTE — Plan of Care (Signed)
Continue current POC 

## 2018-12-29 NOTE — Anesthesia Postprocedure Evaluation (Signed)
Anesthesia Post Note  Patient: Jon Jackson  Procedure(s) Performed: Decompressive lumbar laminectomy at L3-L4 (N/A Back)     Patient location during evaluation: PACU Anesthesia Type: General Level of consciousness: awake and alert Pain management: pain level controlled Vital Signs Assessment: post-procedure vital signs reviewed and stable Respiratory status: spontaneous breathing, nonlabored ventilation and respiratory function stable Cardiovascular status: blood pressure returned to baseline and stable Postop Assessment: no apparent nausea or vomiting Anesthetic complications: no    Last Vitals:  Vitals:   12/29/18 1215 12/29/18 1230  BP: 115/78 117/80  Pulse: 89 87  Resp: 10 10  Temp:    SpO2: 94% 95%    Last Pain:  Vitals:   12/29/18 1230  TempSrc:   PainSc: Asleep                 Lynda Rainwater

## 2018-12-29 NOTE — Anesthesia Procedure Notes (Signed)
Procedure Name: Intubation Date/Time: 12/29/2018 9:53 AM Performed by: West Pugh, CRNA Pre-anesthesia Checklist: Patient identified, Emergency Drugs available, Suction available, Patient being monitored and Timeout performed Patient Re-evaluated:Patient Re-evaluated prior to induction Oxygen Delivery Method: Circle system utilized Preoxygenation: Pre-oxygenation with 100% oxygen Induction Type: IV induction Ventilation: Mask ventilation without difficulty Laryngoscope Size: Mac and 4 Grade View: Grade I Tube type: Oral Tube size: 7.5 mm Number of attempts: 1 Airway Equipment and Method: Stylet Placement Confirmation: ETT inserted through vocal cords under direct vision,  positive ETCO2,  CO2 detector and breath sounds checked- equal and bilateral Secured at: 21 cm Tube secured with: Tape Dental Injury: Teeth and Oropharynx as per pre-operative assessment

## 2018-12-30 ENCOUNTER — Encounter (HOSPITAL_COMMUNITY): Payer: Self-pay | Admitting: Orthopedic Surgery

## 2018-12-30 DIAGNOSIS — F419 Anxiety disorder, unspecified: Secondary | ICD-10-CM | POA: Diagnosis not present

## 2018-12-30 DIAGNOSIS — M48061 Spinal stenosis, lumbar region without neurogenic claudication: Secondary | ICD-10-CM | POA: Diagnosis not present

## 2018-12-30 DIAGNOSIS — F329 Major depressive disorder, single episode, unspecified: Secondary | ICD-10-CM | POA: Diagnosis not present

## 2018-12-30 DIAGNOSIS — I4891 Unspecified atrial fibrillation: Secondary | ICD-10-CM | POA: Diagnosis not present

## 2018-12-30 DIAGNOSIS — M1990 Unspecified osteoarthritis, unspecified site: Secondary | ICD-10-CM | POA: Diagnosis not present

## 2018-12-30 DIAGNOSIS — M4126 Other idiopathic scoliosis, lumbar region: Secondary | ICD-10-CM | POA: Diagnosis not present

## 2018-12-30 NOTE — Progress Notes (Signed)
Subjective: 1 Day Post-Op Procedure(s) (LRB): Decompressive lumbar laminectomy at L3-L4 (N/A) Patient reports pain as 2 on 0-10 scale. Doing very well. Normal strength in lowers. Will DC today   Objective: Vital signs in last 24 hours: Temp:  [97.4 F (36.3 C)-98.8 F (37.1 C)] 98 F (36.7 C) (10/01 0507) Pulse Rate:  [65-102] 72 (10/01 0507) Resp:  [10-20] 18 (10/01 0507) BP: (102-143)/(67-104) 139/81 (10/01 0507) SpO2:  [92 %-99 %] 99 % (10/01 0507) Weight:  [102.3 kg] 102.3 kg (09/30 2039)  Intake/Output from previous day: 09/30 0701 - 10/01 0700 In: 3001.1 [P.O.:360; I.V.:2441.1; IV Piggyback:200] Out: 3225 [Urine:3175; Blood:50] Intake/Output this shift: No intake/output data recorded.  No results for input(s): HGB in the last 72 hours. No results for input(s): WBC, RBC, HCT, PLT in the last 72 hours. No results for input(s): NA, K, CL, CO2, BUN, CREATININE, GLUCOSE, CALCIUM in the last 72 hours. No results for input(s): LABPT, INR in the last 72 hours.  Neurologically intact Dorsiflexion/Plantar flexion intact   Assessment/Plan: 1 Day Post-Op Procedure(s) (LRB): Decompressive lumbar laminectomy at L3-L4 (N/A) Up with therapy and then DC. Office in 2-weeks.      Jon Jackson 12/30/2018, 7:22 AM

## 2018-12-30 NOTE — Plan of Care (Signed)

## 2018-12-30 NOTE — Progress Notes (Signed)
Physical Therapy Treatment Patient Details Name: Jon Jackson MRN: [id number] DOB: 1951/02/21 Today's Date: 12/30/2018    History of Present Illness 68 yo male admitted for decompressive surgery for spinal stenosis with complete block at L3-L4 and Foraminal Stenosis involving the L-3 L-4 Nerve Roots bilaterally.    PT Comments    POD # 1 Pt eager to D/C to home.  Assisted with amb in hallway, practiced stairs then reviewed back precautions.   Addressed all mobility questions, discussed appropriate activity, educated on use of ICE.  Pt ready for D/C to home.   Follow Up Recommendations  No PT follow up     Equipment Recommendations  Rolling walker with 5" wheels    Recommendations for Other Services       Precautions / Restrictions Precautions Precautions: Back Precaution Booklet Issued: Yes (comment) Precaution Comments: reviewed back precautions    Mobility  Bed Mobility               General bed mobility comments: OOB in recliner  Transfers Overall transfer level: Needs assistance Equipment used: Rolling walker (2 wheeled) Transfers: Sit to/from Stand Sit to Stand: Supervision         General transfer comment: one cue for hand placement, pulled on RW to stand  Ambulation/Gait Ambulation/Gait assistance: Supervision Gait Distance (Feet): 285 Feet Assistive device: None Gait Pattern/deviations: Step-through pattern     General Gait Details: no need for any AD   Stairs Stairs: Yes Stairs assistance: Supervision Stair Management: Two rails;Step to pattern;Forwards Number of Stairs: 2 General stair comments: <25% VC's on safety   Wheelchair Mobility    Modified Rankin (Stroke Patients Only)       Balance                                            Cognition Arousal/Alertness: Awake/alert Behavior During Therapy: WFL for tasks assessed/performed Overall Cognitive Status: Within Functional Limits for tasks assessed                                        Exercises      General Comments        Pertinent Vitals/Pain Pain Assessment: 0-10 Pain Score: 3  Pain Descriptors / Indicators: Discomfort;Cramping Pain Intervention(s): Monitored during session    Home Living                      Prior Function            PT Goals (current goals can now be found in the care plan section) Progress towards PT goals: Progressing toward goals    Frequency    Min 5X/week      PT Plan Current plan remains appropriate    Co-evaluation              AM-PAC PT "6 Clicks" Mobility   Outcome Measure  Help needed turning from your back to your side while in a flat bed without using bedrails?: A Little Help needed moving from lying on your back to sitting on the side of a flat bed without using bedrails?: A Little Help needed moving to and from a bed to a chair (including a wheelchair)?: A Little Help needed standing up from a chair using your arms (  e.g., wheelchair or bedside chair)?: A Little Help needed to walk in hospital room?: A Little Help needed climbing 3-5 steps with a railing? : A Little 6 Click Score: 18    End of Session Equipment Utilized During Treatment: Gait belt Activity Tolerance: Patient tolerated treatment well Patient left: in chair Nurse Communication: Mobility status(pt ready for D/C to home)       Time: [id number] PT Time Calculation (min) (ACUTE ONLY): 11 min  Charges:  $Gait Training: 8-22 mins                     Rica Koyanagi  PTA Acute  Rehabilitation Services Pager      [phone] Office      [phone]

## 2018-12-31 NOTE — Discharge Summary (Signed)
Physician Discharge Summary   Patient ID: TEVAN IRWIN MRN: [id number] DOB/AGE: April 17, 1950 68 y.o.  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 Anxiety   . Arthritis   . Atrial fibrillation ([place])   . Basal cell carcinoma   . Depression   . GERD (gastroesophageal reflux disease)   . Hyperlipidemia   . Squamous carcinoma    Discharge Diagnoses:   Active Problems:   Spinal stenosis, lumbar region with neurogenic claudication  Procedure:  Procedure(s) (LRB): Decompressive lumbar laminectomy at L3-L4 (N/A)   Consults: None  HPI: Patient presented to the office with progressively worsening pain in the low back with radiation of pain into the legs. He also developed weakness in the legs with no resolution of symptoms with conservative treatments.      Laboratory Data: Hospital Outpatient Visit on 12/25/2018  Component Date Value Ref Range Status  . SARS-CoV-2, NAA 12/25/2018 NOT DETECTED  NOT DETECTED Final   Comment: (NOTE) This nucleic acid amplification test was developed and its performance characteristics determined by [employer]. Nucleic acid amplification tests include PCR and TMA. This test has not been FDA cleared or approved. This test has been authorized by FDA under an Emergency Use Authorization (EUA). This test is only authorized for the duration of time the declaration that circumstances exist justifying the authorization of the emergency use of in vitro diagnostic tests for detection of SARS-CoV-2 virus and/or diagnosis of COVID-19 infection under section 564(b)(1) of the Act, 21 U.S.C. [id number]) (1), unless the authorization is terminated or revoked sooner. When diagnostic testing is negative, the possibility of a false negative result should be considered in the context of a patient's recent exposures  and the presence of clinical signs and symptoms consistent with COVID-19. An individual without symptoms of COVID- 19 and who is not shedding SARS-CoV-2 vi                          rus would expect to have a negative (not detected) result in this assay. Performed At: [facility] [place], [place] [id number] Rush Farmer MD [id number]   . Coronavirus Source 12/25/2018 NASOPHARYNGEAL   Final   Performed at [facility], [place] [address], [place], [place] [address]    X-Rays:Dg Spine Portable 1 View  Result Date: 12/29/2018 CLINICAL DATA:  Intraoperative localization for spine surgery. EXAM: PORTABLE SPINE - 1 VIEW COMPARISON:  Earlier intraoperative films. FINDINGS: There is a surgical retractor and a surgical instrument marking the L3-4 disc space level. IMPRESSION: L3-4 marked intraoperatively. Electronically Signed   By: Marijo Sanes M.D.   On: 12/29/2018 11:03   Dg Spine Portable 1 View  Result Date: 12/29/2018 CLINICAL DATA:  Intraoperative localization EXAM: PORTABLE SPINE - 1 VIEW COMPARISON:  None. FINDINGS: Single cross-table lateral x-ray of the lumbar spine is provided. Posterior metallic instrument with the tip projecting over the L3 spinous process. Degenerative disease with disc height loss throughout the lumbar spine. IMPRESSION: Intraoperative localization. Electronically Signed   By: Kathreen Devoid   On: 12/29/2018 10:42   Dg Spine Portable 1 View  Result Date: 12/29/2018 CLINICAL DATA:  Localization for spinal surgery EXAM: PORTABLE SPINE - 1 VIEW COMPARISON:  11/29/2018 FINDINGS: Single lateral view of the lumbar spine demonstrates two surgical probes posterior to the L4-5 and L5-S1 levels. Lowest  well-formed disc space is L5-S1. Please see labeling designation on provided image. IMPRESSION: As above. Electronically Signed   By: Davina Poke M.D.   On: 12/29/2018 10:29    EKG: Orders placed or performed during the hospital encounter of  11/29/18  . EKG  . EKG     Hospital Course: Patient was admitted to [facility] and taken to the OR and underwent the above state procedure without complications.  Patient tolerated the procedure well and was later transferred to the recovery room and then to the orthopaedic floor for postoperative care.  They were given PO and IV analgesics for pain control following their surgery.  They were given 24 hours of postoperative antibiotics.   PT was consulted postop to assist with mobility and transfers.  The patient was allowed to be WBAT with therapy and was taught back precautions. Discharge planning was consulted to help with postop disposition and equipment needs.  Patient had a good night on the evening of surgery and started to get up OOB with therapy on day one. Patient was seen in rounds and was ready to go home on day one.  They were given discharge instructions and dressing directions.  They were instructed on when to follow up in the office with Dr. Gladstone Lighter.   Diet: Cardiac diet Activity:WBAT Follow-up:in 2 weeks Disposition - Home Discharged Condition: stable   Discharge Instructions    Call MD / Call 911   Complete by: As directed    If you experience chest pain or shortness of breath, CALL 911 and be transported to the hospital emergency room.  If you develope a fever above 101 F, pus (white drainage) or increased drainage or redness at the wound, or calf pain, call your surgeon's office.   Constipation Prevention   Complete by: As directed    Drink plenty of fluids.  Prune juice may be helpful.  You may use a stool softener, such as Colace (over the counter) 100 mg twice a day.  Use MiraLax (over the counter) for constipation as needed.   Diet - low sodium heart healthy   Complete by: As directed    Discharge instructions   Complete by: As directed    For the first three days, remove your dressing, and tape a piece of saran wrap over your incision. Take your shower,  then remove the saran wrap and put a clean dressing on. After three days you can shower without the saran wrap.  No lifting or excessive bending No driving while taking pain medications Call Dr. Gladstone Lighter if any wound complications or temperature of 101 degrees F or over.  Call the office for an appointment to see Dr. Gladstone Lighter in two weeks: [phone] and ask for Dr. Charlestine Night nurse, Brunilda Payor.   Increase activity slowly as tolerated   Complete by: As directed      Allergies as of 12/30/2018   No Known Allergies     Medication List    TAKE these medications   acetaminophen 500 MG tablet Commonly known as: TYLENOL Take 1,000-1,500 mg by mouth every 6 (six) hours as needed for moderate pain or headache.   ALLERGY EYE OP Place 1-2 drops into both eyes 3 (three) times daily as needed (for seasonal allergy/itchy eyes).   apixaban 5 MG Tabs tablet Commonly known as: Eliquis Take 1 tablet (5 mg total) by mouth 2 (two) times daily. Notes to patient: Start on Saturday   citalopram 20 MG tablet Commonly known as: CELEXA  Take 1 tablet (20 mg total) by mouth daily.   HYDROcodone-acetaminophen 7.5-325 MG tablet Commonly known as: NORCO Take 1 tablet by mouth every 6 (six) hours as needed for severe pain ((score 7 to 10)).   ibuprofen 200 MG tablet Commonly known as: ADVIL Take 400-600 mg by mouth every 6 (six) hours as needed for headache or moderate pain. Notes to patient: Don't take on saturday   levocetirizine 5 MG tablet Commonly known as: XYZAL Take 5 mg by mouth daily.   Melatonin 10 MG Tabs Take 10 mg by mouth at bedtime as needed (sleep).   methocarbamol 500 MG tablet Commonly known as: ROBAXIN Take 1 tablet (500 mg total) by mouth every 6 (six) hours as needed for muscle spasms. Notes to patient: Muscle Relaxer   multivitamin tablet Take 1 tablet by mouth daily.   simvastatin 20 MG tablet Commonly known as: ZOCOR Take 10 mg by mouth daily.      Follow-up  Information    Latanya Maudlin, MD. Schedule an appointment as soon as possible for a visit in 2 week(s).   Specialty: Orthopedic Surgery Contact information: [address] [place] [address] [phone]           Signed: Ardeen Jourdain, PA-C Orthopaedic Surgery 12/31/2018, 12:09 PM

## 2019-01-04 DIAGNOSIS — L821 Other seborrheic keratosis: Secondary | ICD-10-CM | POA: Diagnosis not present

## 2019-01-04 DIAGNOSIS — Z85820 Personal history of malignant melanoma of skin: Secondary | ICD-10-CM | POA: Diagnosis not present

## 2019-01-04 DIAGNOSIS — L570 Actinic keratosis: Secondary | ICD-10-CM | POA: Diagnosis not present

## 2019-02-22 ENCOUNTER — Other Ambulatory Visit: Payer: Self-pay

## 2019-02-22 DIAGNOSIS — Z20828 Contact with and (suspected) exposure to other viral communicable diseases: Secondary | ICD-10-CM | POA: Diagnosis not present

## 2019-02-22 DIAGNOSIS — Z20822 Contact with and (suspected) exposure to covid-19: Secondary | ICD-10-CM

## 2019-02-23 LAB — NOVEL CORONAVIRUS, NAA: SARS-CoV-2, NAA: NOT DETECTED

## 2019-03-04 DIAGNOSIS — Z23 Encounter for immunization: Secondary | ICD-10-CM | POA: Diagnosis not present

## 2019-03-09 ENCOUNTER — Other Ambulatory Visit: Payer: Self-pay

## 2019-03-09 DIAGNOSIS — Z20828 Contact with and (suspected) exposure to other viral communicable diseases: Secondary | ICD-10-CM | POA: Diagnosis not present

## 2019-03-09 DIAGNOSIS — Z20822 Contact with and (suspected) exposure to covid-19: Secondary | ICD-10-CM

## 2019-03-10 ENCOUNTER — Telehealth: Payer: Self-pay | Admitting: Unknown Physician Specialty

## 2019-03-10 LAB — NOVEL CORONAVIRUS, NAA: SARS-CoV-2, NAA: DETECTED — AB

## 2019-03-10 NOTE — Telephone Encounter (Signed)
Discussed with patient about Covid symptoms and the use of bamlanivimab, a monoclonal antibody infusion for those with mild to moderate Covid symptoms and at a high risk of hospitalization.  Pt is qualified for this infusion at the [facility] infusion center due to Age > 65 and Diabetes   Left message to call back

## 2019-03-11 ENCOUNTER — Telehealth: Payer: Self-pay | Admitting: Unknown Physician Specialty

## 2019-03-11 ENCOUNTER — Other Ambulatory Visit: Payer: Self-pay | Admitting: Unknown Physician Specialty

## 2019-03-11 DIAGNOSIS — U071 COVID-19: Secondary | ICD-10-CM

## 2019-03-11 NOTE — Telephone Encounter (Signed)
I connected by phone with Jon Jackson on 03/11/2019 at 9:16 AM to discuss the potential use of an new treatment for mild to moderate COVID-19 viral infection in non-hospitalized patients.  This patient is a 68 y.o. male that meets the FDA criteria for Emergency Use Authorization of bamlanivimab or casirivimab\imdevimab.  Has a (+) direct SARS-CoV-2 viral test result  Has mild or moderate COVID-19   Is ? 68 years of age and weighs ? 40 kg  Is NOT hospitalized due to COVID-19  Is NOT requiring oxygen therapy or requiring an increase in baseline oxygen flow rate due to COVID-19  Is within 10 days of symptom onset  Has at least one of the high risk factor(s) for progression to severe COVID-19 and/or hospitalization as defined in EUA.  Specific high risk criteria : >/= 68 yo   I have spoken and communicated the following to the patient or parent/caregiver:  1. FDA has authorized the emergency use of bamlanivimab and casirivimab\imdevimab for the treatment of mild to moderate COVID-19 in adults and pediatric patients with positive results of direct SARS-CoV-2 viral testing who are 48 years of age and older weighing at least 40 kg, and who are at high risk for progressing to severe COVID-19 and/or hospitalization.  2. The significant known and potential risks and benefits of bamlanivimab and casirivimab\imdevimab, and the extent to which such potential risks and benefits are unknown.  3. Information on available alternative treatments and the risks and benefits of those alternatives, including clinical trials.  4. Patients treated with bamlanivimab and casirivimab\imdevimab should continue to self-isolate and use infection control measures (e.g., wear mask, isolate, social distance, avoid sharing personal items, clean and disinfect "high touch" surfaces, and frequent handwashing) according to CDC guidelines.   5. The patient or parent/caregiver has the option to accept or refuse  bamlanivimab or casirivimab\imdevimab .  After reviewing this information with the patient, The patient agreed to proceed with receiving the casirivimab\imdevimab infusion and will be provided a copy of the Fact sheet prior to receiving the infusion.Kathrine Haddock 03/11/2019 9:16 AM

## 2019-03-14 ENCOUNTER — Ambulatory Visit (HOSPITAL_COMMUNITY): Payer: Medicare Other

## 2019-03-15 ENCOUNTER — Ambulatory Visit: Payer: Medicare Other | Admitting: Cardiology

## 2019-03-22 ENCOUNTER — Other Ambulatory Visit: Payer: Self-pay | Admitting: Family Medicine

## 2019-03-28 ENCOUNTER — Other Ambulatory Visit: Payer: Self-pay | Admitting: Family Medicine

## 2019-05-05 ENCOUNTER — Encounter: Payer: Self-pay | Admitting: Family Medicine

## 2019-05-26 DIAGNOSIS — Z23 Encounter for immunization: Secondary | ICD-10-CM | POA: Diagnosis not present

## 2019-06-28 ENCOUNTER — Ambulatory Visit: Payer: Medicare Other | Admitting: Cardiology

## 2019-07-05 DIAGNOSIS — L570 Actinic keratosis: Secondary | ICD-10-CM | POA: Diagnosis not present

## 2019-07-08 DIAGNOSIS — Z23 Encounter for immunization: Secondary | ICD-10-CM | POA: Diagnosis not present

## 2019-07-08 NOTE — Progress Notes (Signed)
PCP:  Mikey Kirschner, MD Primary Cardiologist: No primary care provider on file. Electrophysiologist: Dr. Arnette Norris is a 69 y.o. male with past medical history of persistent AF s/p ablation, HTN, and HLD who presents today for routine electrophysiology followup. They are seen for Dr. Curt Bears.   Since last being seen in our clinic, the patient reports doing well. He had COVID in December, but has no deficit. He got his second dose of vaccine last week. He denies symptoms chest pain, orthopnea, PND, lower extremity edema, claudication, dizziness, presyncope, syncope, bleeding, or neurologic sequela. The patient is tolerating medications without difficulties.  He states the only time recently he has had palpitations is after he took a decongestant. He has mild SOB with moderate exertion. Denies bleeding on Eliquis.  The patient feels that he is tolerating medications without difficulties and is otherwise without complaint today.   Past Medical History:  Diagnosis Date  . Anxiety   . Arthritis   . Atrial fibrillation ([place])   . Basal cell carcinoma   . Depression   . GERD (gastroesophageal reflux disease)   . Hyperlipidemia   . Squamous carcinoma    Past Surgical History:  Procedure Laterality Date  . ATRIAL FIBRILLATION ABLATION  07/22/2016  . ATRIAL FIBRILLATION ABLATION N/A 07/22/2016   Procedure: Atrial Fibrillation Ablation;  Surgeon: Will Meredith Leeds, MD;  Location: [facility];  Service: Cardiovascular;  Laterality: N/A;  . BASAL CELL CARCINOMA EXCISION     forehead; top of scalp  . BIOPSY  05/27/2017   Procedure: BIOPSY;  Surgeon: [name], MD;  [facility];  Service: Endoscopy;;  right colon, left colon;  . CARDIOVERSION N/A 04/07/2016   Procedure: CARDIOVERSION;  Surgeon: Satira Sark, MD;  [facility]: [facility];  Service: Cardiovascular;  Laterality: N/A;  . COLECTOMY  1990s   removal of section due to strangulated bowel.  .  COLONOSCOPY N/A 05/30/2013   Procedure: COLONOSCOPY;  Surgeon: [name], MD;  [facility];  Service: Endoscopy;  Laterality: N/A;  8:45 AM  . COLONOSCOPY    . COLONOSCOPY N/A 05/27/2017   Procedure: COLONOSCOPY;  Surgeon: [name], MD;  [facility];  Service: Endoscopy;  Laterality: N/A;  1:00pm  . INGUINAL HERNIA REPAIR Right 1990s  . LUMBAR LAMINECTOMY/DECOMPRESSION MICRODISCECTOMY N/A 12/29/2018   Procedure: Decompressive lumbar laminectomy at L3-L4;  Surgeon: Latanya Maudlin, MD;  Location: [facility];  Service: Orthopedics;  Laterality: N/A;  192min  . SHOULDER ARTHROSCOPY WITH ROTATOR CUFF REPAIR AND SUBACROMIAL DECOMPRESSION Left 05/15/2016   Procedure: LEFT SHOULDER ARTHROSCOPY WITH ROTATOR CUFF REPAIR AND SUBACROMIAL DECOMPRESSION AND DISTAL CLAVICLE RESECTION;  Surgeon: [name], MD;  Location: [facility];  Service: Orthopedics;  Laterality: Left;  requests 2hrs  . SHOULDER SURGERY Right   . SQUAMOUS CELL CARCINOMA EXCISION     scalp  . TONSILLECTOMY      Current Outpatient Medications  Medication Sig Dispense Refill  . acetaminophen (TYLENOL) 500 MG tablet Take 1,000-1,500 mg by mouth every 6 (six) hours as needed for moderate pain or headache.     Marland Kitchen apixaban (ELIQUIS) 5 MG TABS tablet Take 1 tablet (5 mg total) by mouth 2 (two) times daily. 180 tablet 3  . citalopram (CELEXA) 20 MG tablet Take 1 tablet (20 mg total) by mouth daily. 90 tablet 1  . HYDROcodone-acetaminophen (NORCO) 7.5-325 MG tablet Take 1 tablet by mouth every 6 (six) hours as needed  for severe pain ((score 7 to 10)). 42 tablet 0  . ibuprofen (ADVIL,MOTRIN) 200 MG tablet Take 400-600 mg by mouth every 6 (six) hours as needed for headache or moderate pain.     Marland Kitchen levocetirizine (XYZAL) 5 MG tablet Take 5 mg by mouth daily.    . Melatonin 10 MG TABS Take 10 mg by mouth at bedtime as needed (sleep).    . Multiple Vitamin (MULTIVITAMIN) tablet Take 1 tablet by mouth daily.    [name] Fill (ALLERGY EYE OP) Place 1-2 drops into both eyes 3 (three) times daily as needed (for seasonal allergy/itchy eyes).    Marland Kitchen omeprazole (PRILOSEC) 40 MG capsule Take 1 capsule by mouth once daily 90 capsule 3  . simvastatin (ZOCOR) 20 MG tablet Take 10 mg by mouth daily.     No current facility-administered medications for this visit.    No Known Allergies  Social History   Socioeconomic History  . Marital status: Married    Spouse name: Not on file  . Number of children: Not on file  . Years of education: Not on file  . Highest education level: Not on file  Occupational History  . Not on file  Tobacco Use  . Smoking status: Current Some Day Smoker    Packs/day: 1.00    Years: 47.00    Pack years: 47.00    Types: Cigars  . Smokeless tobacco: Never Used  Substance and Sexual Activity  . Alcohol use: Yes    Alcohol/week: 12.0 standard drinks    Types: 12 Cans of beer per week    Comment: daily 3-4 in the summer; soemtimes more on weekends.  . Drug use: No  . Sexual activity: Yes  Other Topics Concern  . Not on file  Social History Narrative  . Not on file   Social Determinants of Health   Financial Resource Strain:   . Difficulty of Paying Living Expenses:   Food Insecurity:   . Worried About Charity fundraiser in the Last Year:   . Arboriculturist in the Last Year:   Transportation Needs:   . Film/video editor (Medical):   Marland Kitchen Lack of Transportation (Non-Medical):   Physical Activity:   . Days of Exercise per Week:   . Minutes of Exercise per Session:   Stress:   . Feeling of Stress :   Social Connections:   . Frequency of Communication with Friends and Family:   . Frequency of Social Gatherings with Friends and Family:   . Attends Religious Services:   . Active Member of Clubs or Organizations:   . Attends Archivist Meetings:   Marland Kitchen Marital Status:   Intimate Partner Violence:   . Fear of Current or Ex-Partner:   .  Emotionally Abused:   Marland Kitchen Physically Abused:   . Sexually Abused:      Review of Systems: General: No chills, fever, night sweats or weight changes  Cardiovascular:  No chest pain, dyspnea on exertion, edema, orthopnea, palpitations, paroxysmal nocturnal dyspnea Dermatological: No rash, lesions or masses Respiratory: No cough, dyspnea Urologic: No hematuria, dysuria Abdominal: No nausea, vomiting, diarrhea, bright red blood per rectum, melena, or hematemesis Neurologic: No visual changes, weakness, changes in mental status All other systems reviewed and are otherwise negative except as noted above.  Physical Exam: Vitals:   07/11/19 0908  BP: 130/80  Pulse: 70  SpO2: 97%  Weight: 226 lb 1.9 oz (102.6 kg)  Height: 5\' 10"  (1.778  m)    GEN- The patient is well appearing, alert and oriented x 3 today.   HEENT: normocephalic, atraumatic; sclera clear, conjunctiva pink; hearing intact; oropharynx clear; neck supple, no JVP Lymph- no cervical lymphadenopathy Lungs- Clear to ausculation bilaterally, normal work of breathing.  No wheezes, rales, rhonchi Heart- Regular rate and rhythm, no rubs or gallops. At least 3/6 SEM noted, PMI not laterally displaced GI- soft, non-tender, non-distended, bowel sounds present, no hepatosplenomegaly Extremities- no clubbing, cyanosis, or edema; DP/PT/radial pulses 2+ bilaterally MS- no significant deformity or atrophy Skin- warm and dry, no rash or lesion Psych- euthymic mood, full affect Neuro- strength and sensation are intact  EKG is ordered. Personal review of EKG from today shows NSR at 70 bpm with normal intervals  Assessment and Plan:  1. Persistent atrial fibrillation s/p ablation 06/2016 Continue eliquis for CHA2DS2VASC of at least 2 . Denies bleeding  2. HTN Continue current medications  3. HLD Continue statin per PCP  4. Aortic stenosis Echo 02/2016 showed mild calcific aortic stenosis with restricted mobility of his noncoronary  cusp. Will repeat echo. If has changed significantly, will refer to structural heart team, or at the very least, gen cards to follow serially.   RTC to see Dr. Curt Bears in 6 months, sooner with symptoms.  Shirley Friar, PA-C  07/11/19 9:20 AM

## 2019-07-11 ENCOUNTER — Other Ambulatory Visit: Payer: Self-pay

## 2019-07-11 ENCOUNTER — Encounter: Payer: Self-pay | Admitting: Student

## 2019-07-11 ENCOUNTER — Ambulatory Visit (INDEPENDENT_AMBULATORY_CARE_PROVIDER_SITE_OTHER): Payer: Medicare Other | Admitting: Student

## 2019-07-11 VITALS — BP 130/80 | HR 70 | Ht 70.0 in | Wt 226.1 lb

## 2019-07-11 DIAGNOSIS — I10 Essential (primary) hypertension: Secondary | ICD-10-CM

## 2019-07-11 DIAGNOSIS — I350 Nonrheumatic aortic (valve) stenosis: Secondary | ICD-10-CM | POA: Diagnosis not present

## 2019-07-11 DIAGNOSIS — E785 Hyperlipidemia, unspecified: Secondary | ICD-10-CM

## 2019-07-11 DIAGNOSIS — I4819 Other persistent atrial fibrillation: Secondary | ICD-10-CM

## 2019-07-11 NOTE — Patient Instructions (Signed)
Medication Instructions:  NONE *If you need a refill on your cardiac medications before your next appointment, please call your pharmacy*   Lab Work: NONE If you have labs (blood work) drawn today and your tests are completely normal, you will receive your results only by: Marland Kitchen MyChart Message (if you have MyChart) OR . A paper copy in the mail If you have any lab test that is abnormal or we need to change your treatment, we will call you to review the results.   Testing/Procedures: PLEASE SCHEDULE Your physician has requested that you have an echocardiogram. Echocardiography is a painless test that uses sound waves to create images of your heart. It provides your doctor with information about the size and shape of your heart and how well your heart's chambers and valves are working. This procedure takes approximately one hour. There are no restrictions for this procedure.     Follow-Up: At [facility], you and your health needs are our priority.  As part of our continuing mission to provide you with exceptional heart care, we have created designated Provider Care Teams.  These Care Teams include your primary Cardiologist (physician) and Advanced Practice Providers (APPs -  Physician Assistants and Nurse Practitioners) who all work together to provide you with the care you need, when you need it.  Your next appointment:   6 month(s)  The format for your next appointment:   Either In Person or Virtual  Provider:   Dr Curt Bears   Other Instructions

## 2019-07-13 ENCOUNTER — Other Ambulatory Visit: Payer: Self-pay | Admitting: Family Medicine

## 2019-07-19 ENCOUNTER — Ambulatory Visit (HOSPITAL_COMMUNITY): Payer: Medicare Other | Attending: Cardiovascular Disease

## 2019-07-19 ENCOUNTER — Other Ambulatory Visit: Payer: Self-pay

## 2019-07-19 DIAGNOSIS — I350 Nonrheumatic aortic (valve) stenosis: Secondary | ICD-10-CM | POA: Diagnosis not present

## 2019-07-19 DIAGNOSIS — I4819 Other persistent atrial fibrillation: Secondary | ICD-10-CM

## 2019-12-27 ENCOUNTER — Encounter: Payer: Self-pay | Admitting: Family Medicine

## 2019-12-27 ENCOUNTER — Ambulatory Visit (INDEPENDENT_AMBULATORY_CARE_PROVIDER_SITE_OTHER): Payer: Medicare Other | Admitting: Family Medicine

## 2019-12-27 ENCOUNTER — Other Ambulatory Visit: Payer: Self-pay

## 2019-12-27 VITALS — BP 142/84 | HR 87 | Temp 97.7°F | Ht 70.0 in | Wt 228.0 lb

## 2019-12-27 DIAGNOSIS — I10 Essential (primary) hypertension: Secondary | ICD-10-CM

## 2019-12-27 DIAGNOSIS — E785 Hyperlipidemia, unspecified: Secondary | ICD-10-CM

## 2019-12-27 DIAGNOSIS — Z23 Encounter for immunization: Secondary | ICD-10-CM

## 2019-12-27 DIAGNOSIS — N400 Enlarged prostate without lower urinary tract symptoms: Secondary | ICD-10-CM | POA: Diagnosis not present

## 2019-12-27 MED ORDER — BUPROPION HCL ER (SR) 150 MG PO TB12: ORAL_TABLET | ORAL | 0 refills | Status: DC

## 2019-12-27 MED ORDER — FINASTERIDE 5 MG PO TABS: 5.0000 mg | ORAL_TABLET | Freq: Every day | ORAL | 0 refills | Status: DC

## 2019-12-27 MED ORDER — NICOTINE 14 MG/24HR TD PT24: 14.0000 mg | MEDICATED_PATCH | Freq: Every day | TRANSDERMAL | 2 refills | Status: DC

## 2019-12-27 NOTE — Patient Instructions (Signed)

## 2019-12-27 NOTE — Progress Notes (Signed)
Patient ID: Jon Jackson, male    DOB: 07/06/1950, 69 y.o.   MRN: [id number]   Chief Complaint  Patient presents with  . Establish Care  . Hyperlipidemia  . Depression  . Nicotine Dependence   Subjective:    HPI  pt arrives to established care and f/u on HLD, depression, and HTN. Pt would like to discuss something to help quit smoking.  Would like senior flu vaccine.   Has noticed some increase in urination at night. Has been going on for several years.  Dx with bph in 2015. Getting up 2-4x per night. Drinking late at dinner time.  Has used patches in past and quit for 1 yr. Wanting to help with quit smoking.  H/o depression- taking celexa and doing well.    Smoking- about 1/2-1ppd and wanting to quit.  Has quit in past with nicoderm patch. Would like to try a medication.  Medical History [name] has a past medical history of Anxiety, Arthritis, Atrial fibrillation ([place]), Basal cell carcinoma, Depression, GERD (gastroesophageal reflux disease), Hyperlipidemia, and Squamous carcinoma.   Outpatient Encounter Medications as of 12/27/2019  Medication Sig  . acetaminophen (TYLENOL) 500 MG tablet Take 1,000-1,500 mg by mouth every 6 (six) hours as needed for moderate pain or headache.   Marland Kitchen apixaban (ELIQUIS) 5 MG TABS tablet Take 1 tablet (5 mg total) by mouth 2 (two) times daily.  . citalopram (CELEXA) 20 MG tablet Take 1 tablet by mouth once daily  . ibuprofen (ADVIL,MOTRIN) 200 MG tablet Take 400-600 mg by mouth every 6 (six) hours as needed for headache or moderate pain.   Marland Kitchen levocetirizine (XYZAL) 5 MG tablet Take 5 mg by mouth daily.  . Melatonin 10 MG TABS Take 10 mg by mouth at bedtime as needed (sleep).  . Multiple Vitamin (MULTIVITAMIN) tablet Take 1 tablet by mouth daily.  [name] Fill (ALLERGY EYE OP) Place 1-2 drops into both eyes 3 (three) times daily as needed (for seasonal allergy/itchy eyes).  Marland Kitchen omeprazole (PRILOSEC) 40 MG capsule Take 1 capsule  by mouth once daily  . simvastatin (ZOCOR) 20 MG tablet Take 10 mg by mouth daily.  Marland Kitchen buPROPion (WELLBUTRIN SR) 150 MG 12 hr tablet Take 1 tab p.o. am for 14 days, then increase to 1 tab bid.  . finasteride (PROSCAR) 5 MG tablet Take 1 tablet (5 mg total) by mouth daily.  . nicotine (NICODERM CQ) 14 mg/24hr patch Place 1 patch (14 mg total) onto the skin daily.  . [DISCONTINUED] HYDROcodone-acetaminophen (NORCO) 7.5-325 MG tablet Take 1 tablet by mouth every 6 (six) hours as needed for severe pain ((score 7 to 10)).   No facility-administered encounter medications on file as of 12/27/2019.     Review of Systems  Constitutional: Negative for chills and fever.  HENT: Negative for congestion, rhinorrhea and sore throat.   Respiratory: Negative for cough, shortness of breath and wheezing.   Cardiovascular: Negative for chest pain and leg swelling.  Gastrointestinal: Negative for abdominal pain, diarrhea, nausea and vomiting.  Genitourinary: Negative for decreased urine volume, difficulty urinating, dysuria, frequency, hematuria and urgency.       +nocturia  Musculoskeletal: Negative for back pain.  Skin: Negative for rash.  Neurological: Negative for dizziness, weakness and headaches.     Vitals BP (!) 142/84   Pulse 87   Temp 97.7 F (36.5 C)   Ht _0  (1.778 m)   Wt 228 lb (103.4 kg)   SpO2 99%   BMI 32.71 kg/m  Objective:   Physical Exam Vitals and nursing note reviewed.  Constitutional:      General: He is not in acute distress.    Appearance: Normal appearance. He is not ill-appearing.  HENT:     Head: Normocephalic.     Nose: Nose normal. No congestion.     Mouth/Throat:     Mouth: Mucous membranes are moist.     Pharynx: No oropharyngeal exudate.  Eyes:     Extraocular Movements: Extraocular movements intact.     Conjunctiva/sclera: Conjunctivae normal.     Pupils: Pupils are equal, round, and reactive to light.  Cardiovascular:     Rate and Rhythm: Normal  rate and regular rhythm.     Pulses: Normal pulses.     Heart sounds: Murmur (systolic 4/6) heard.   Pulmonary:     Effort: Pulmonary effort is normal.     Breath sounds: Normal breath sounds. No wheezing, rhonchi or rales.  Musculoskeletal:        General: Normal range of motion.     Right lower leg: No edema.     Left lower leg: No edema.  Skin:    General: Skin is warm and dry.     Findings: No rash.  Neurological:     General: No focal deficit present.     Mental Status: He is alert and oriented to person, place, and time.     Cranial Nerves: No cranial nerve deficit.  Psychiatric:        Mood and Affect: Mood normal.        Behavior: Behavior normal.        Thought Content: Thought content normal.        Judgment: Judgment normal.      Assessment and Plan   1. Hyperlipidemia LDL goal <130 - CBC - Lipid panel  2. Need for vaccination - Flu Vaccine QUAD High Dose(Fluad)  3. Essential hypertension - CBC - CBC with Differential/Platelet - CMP14+EGFR  4. Prostate hypertrophy - PSA     Smoking cessation- - call back in 29mo if wellbutrin working. - gave nicoderm patches also. Gave trial of finasteride and pt to call back if working and needing refill.   hld- labs ordered.  Cont zocor.  htn- elevated.  Pt to dec salt intake and recheck on next visit. bph and nocturia- worsneing.  Not on meds, will start finasteride and call if not seeing any improvements.  psa ordered. afib- stable, cont f/u with cards.  Cont eliquis.   HM- ordered flu vaccine.  Pt to get labs today.  F/u 656mo

## 2019-12-28 LAB — CBC WITH DIFFERENTIAL/PLATELET
Basophils Absolute: 0 10*3/uL (ref 0.0–0.2)
Basos: 0 %
EOS (ABSOLUTE): 0.4 10*3/uL (ref 0.0–0.4)
Eos: 5 %
Hematocrit: 46.3 % (ref 37.5–51.0)
Hemoglobin: 15.5 g/dL (ref 13.0–17.7)
Immature Grans (Abs): 0 10*3/uL (ref 0.0–0.1)
Immature Granulocytes: 0 %
Lymphocytes Absolute: 1.2 10*3/uL (ref 0.7–3.1)
Lymphs: 17 %
MCH: 32 pg (ref 26.6–33.0)
MCHC: 33.5 g/dL (ref 31.5–35.7)
MCV: 96 fL (ref 79–97)
Monocytes Absolute: 0.5 10*3/uL (ref 0.1–0.9)
Monocytes: 7 %
Neutrophils Absolute: 4.8 10*3/uL (ref 1.4–7.0)
Neutrophils: 71 %
Platelets: 234 10*3/uL (ref 150–450)
RBC: 4.85 x10E6/uL (ref 4.14–5.80)
RDW: 11.7 % (ref 11.6–15.4)
WBC: 6.9 10*3/uL (ref 3.4–10.8)

## 2019-12-28 LAB — CMP14+EGFR
ALT: 27 IU/L (ref 0–44)
AST: 23 IU/L (ref 0–40)
Albumin/Globulin Ratio: 2 (ref 1.2–2.2)
Albumin: 4.4 g/dL (ref 3.8–4.8)
Alkaline Phosphatase: 112 IU/L (ref 44–121)
BUN/Creatinine Ratio: 13 (ref 10–24)
BUN: 11 mg/dL (ref 8–27)
Bilirubin Total: 0.3 mg/dL (ref 0.0–1.2)
CO2: 25 mmol/L (ref 20–29)
Calcium: 9.7 mg/dL (ref 8.6–10.2)
Chloride: 102 mmol/L (ref 96–106)
Creatinine, Ser: 0.87 mg/dL (ref 0.76–1.27)
GFR calc Af Amer: 102 mL/min/{1.73_m2} (ref >59)
GFR calc non Af Amer: 88 mL/min/{1.73_m2} (ref >59)
Globulin, Total: 2.2 g/dL (ref 1.5–4.5)
Glucose: 98 mg/dL (ref 65–99)
Potassium: 4.5 mmol/L (ref 3.5–5.2)
Sodium: 140 mmol/L (ref 134–144)
Total Protein: 6.6 g/dL (ref 6.0–8.5)

## 2019-12-28 LAB — LIPID PANEL
Chol/HDL Ratio: 4.2 ratio (ref 0.0–5.0)
Cholesterol, Total: 196 mg/dL (ref 100–199)
HDL: 47 mg/dL (ref >39)
LDL Chol Calc (NIH): 129 mg/dL — ABNORMAL HIGH (ref 0–99)
Triglycerides: 108 mg/dL (ref 0–149)
VLDL Cholesterol Cal: 20 mg/dL (ref 5–40)

## 2019-12-28 LAB — PSA: Prostate Specific Ag, Serum: 0.5 ng/mL (ref 0.0–4.0)

## 2019-12-30 ENCOUNTER — Other Ambulatory Visit: Payer: Self-pay | Admitting: *Deleted

## 2019-12-30 DIAGNOSIS — I10 Essential (primary) hypertension: Secondary | ICD-10-CM

## 2019-12-30 DIAGNOSIS — E785 Hyperlipidemia, unspecified: Secondary | ICD-10-CM

## 2019-12-30 DIAGNOSIS — Z125 Encounter for screening for malignant neoplasm of prostate: Secondary | ICD-10-CM

## 2019-12-30 DIAGNOSIS — Z79899 Other long term (current) drug therapy: Secondary | ICD-10-CM

## 2020-01-09 ENCOUNTER — Ambulatory Visit (INDEPENDENT_AMBULATORY_CARE_PROVIDER_SITE_OTHER): Payer: Medicare Other | Admitting: Student

## 2020-01-09 ENCOUNTER — Other Ambulatory Visit: Payer: Self-pay

## 2020-01-09 ENCOUNTER — Encounter: Payer: Self-pay | Admitting: Student

## 2020-01-09 VITALS — BP 136/78 | HR 91 | Ht 70.0 in | Wt 226.0 lb

## 2020-01-09 DIAGNOSIS — E785 Hyperlipidemia, unspecified: Secondary | ICD-10-CM

## 2020-01-09 DIAGNOSIS — I10 Essential (primary) hypertension: Secondary | ICD-10-CM | POA: Diagnosis not present

## 2020-01-09 DIAGNOSIS — I350 Nonrheumatic aortic (valve) stenosis: Secondary | ICD-10-CM | POA: Diagnosis not present

## 2020-01-09 DIAGNOSIS — I4819 Other persistent atrial fibrillation: Secondary | ICD-10-CM | POA: Diagnosis not present

## 2020-01-09 DIAGNOSIS — L570 Actinic keratosis: Secondary | ICD-10-CM | POA: Diagnosis not present

## 2020-01-09 NOTE — Progress Notes (Signed)
PCP:  Erven Colla, DO Primary Cardiologist: No primary care provider on file. Electrophysiologist: Will Meredith Leeds, MD   Jon Jackson is a 69 y.o. male seen today for Will Meredith Leeds, MD for routine electrophysiology followup.  Since last being seen in our clinic the patient reports doing great overall. He denies any symptoms of AF since his ablation. Is hopeful it will "last him".  he denies chest pain, palpitations, dyspnea, PND, orthopnea, nausea, vomiting, dizziness, syncope, edema, weight gain, or early satiety.  Past Medical History:  Diagnosis Date  . Anxiety   . Arthritis   . Atrial fibrillation ([place])   . Basal cell carcinoma   . Depression   . GERD (gastroesophageal reflux disease)   . Hyperlipidemia   . Squamous carcinoma    Past Surgical History:  Procedure Laterality Date  . ATRIAL FIBRILLATION ABLATION  07/22/2016  . ATRIAL FIBRILLATION ABLATION N/A 07/22/2016   Procedure: Atrial Fibrillation Ablation;  Surgeon: Will Meredith Leeds, MD;  Location: [place] [facility];  Service: Cardiovascular;  Laterality: N/A;  . BASAL CELL CARCINOMA EXCISION     forehead; top of scalp  . BIOPSY  05/27/2017   Procedure: BIOPSY;  Surgeon: [name], MD;  [facility];  Service: Endoscopy;;  right colon, left colon;  . CARDIOVERSION N/A 04/07/2016   Procedure: CARDIOVERSION;  Surgeon: Satira Sark, MD;  [facility];  Service: Cardiovascular;  Laterality: N/A;  . COLECTOMY  1990s   removal of section due to strangulated bowel.  . COLONOSCOPY N/A 05/30/2013   Procedure: COLONOSCOPY;  Surgeon: [name], MD;  [facility];  Service: Endoscopy;  Laterality: N/A;  8:45 AM  . COLONOSCOPY    . COLONOSCOPY N/A 05/27/2017   Procedure: COLONOSCOPY;  Surgeon: [name], MD;  [facility];  Service: Endoscopy;  Laterality: N/A;  1:00pm  . INGUINAL HERNIA REPAIR Right 1990s  . LUMBAR LAMINECTOMY/DECOMPRESSION MICRODISCECTOMY  N/A 12/29/2018   Procedure: Decompressive lumbar laminectomy at L3-L4;  Surgeon: Latanya Maudlin, MD;  [facility];  Service: Orthopedics;  Laterality: N/A;  142min  . SHOULDER ARTHROSCOPY WITH ROTATOR CUFF REPAIR AND SUBACROMIAL DECOMPRESSION Left 05/15/2016   Procedure: LEFT SHOULDER ARTHROSCOPY WITH ROTATOR CUFF REPAIR AND SUBACROMIAL DECOMPRESSION AND DISTAL CLAVICLE RESECTION;  Surgeon: [name], MD;  Location: [place];  Service: Orthopedics;  Laterality: Left;  requests 2hrs  . SHOULDER SURGERY Right   . SQUAMOUS CELL CARCINOMA EXCISION     scalp  . TONSILLECTOMY      Current Outpatient Medications  Medication Sig Dispense Refill  . acetaminophen (TYLENOL) 500 MG tablet Take 1,000-1,500 mg by mouth every 6 (six) hours as needed for moderate pain or headache.     Marland Kitchen apixaban (ELIQUIS) 5 MG TABS tablet Take 1 tablet (5 mg total) by mouth 2 (two) times daily. 180 tablet 3  . buPROPion (WELLBUTRIN SR) 150 MG 12 hr tablet Take 1 tab p.o. am for 14 days, then increase to 1 tab bid. 60 tablet 0  . finasteride (PROSCAR) 5 MG tablet Take 1 tablet (5 mg total) by mouth daily. 90 tablet 0  . ibuprofen (ADVIL,MOTRIN) 200 MG tablet Take 400-600 mg by mouth every 6 (six) hours as needed for headache or moderate pain.     Marland Kitchen levocetirizine (XYZAL) 5 MG tablet Take 5 mg by mouth daily.    . Melatonin 10 MG TABS Take 10 mg by mouth at bedtime as needed (sleep).    Marland Kitchen  Multiple Vitamin (MULTIVITAMIN) tablet Take 1 tablet by mouth daily.    [name] Fill (ALLERGY EYE OP) Place 1-2 drops into both eyes 3 (three) times daily as needed (for seasonal allergy/itchy eyes).    . nicotine (NICODERM CQ) 14 mg/24hr patch Place 1 patch (14 mg total) onto the skin daily. 28 patch 2  . omeprazole (PRILOSEC) 40 MG capsule Take 1 capsule by mouth once daily 90 capsule 3  . simvastatin (ZOCOR) 20 MG tablet Take 10 mg by mouth daily.     No current facility-administered medications for this visit.    No  Known Allergies  Social History   Socioeconomic History  . Marital status: Married    Spouse name: Not on file  . Number of children: Not on file  . Years of education: Not on file  . Highest education level: Not on file  Occupational History  . Not on file  Tobacco Use  . Smoking status: Current Some Day Smoker    Packs/day: 1.00    Years: 47.00    Pack years: 47.00    Types: Cigars  . Smokeless tobacco: Never Used  Vaping Use  . Vaping Use: Never used  Substance and Sexual Activity  . Alcohol use: Yes    Alcohol/week: 12.0 standard drinks    Types: 12 Cans of beer per week    Comment: daily 3-4 in the summer; soemtimes more on weekends.  . Drug use: No  . Sexual activity: Yes  Other Topics Concern  . Not on file  Social History Narrative  . Not on file   Social Determinants of Health   Financial Resource Strain:   . Difficulty of Paying Living Expenses: Not on file  Food Insecurity:   . Worried About Charity fundraiser in the Last Year: Not on file  . Ran Out of Food in the Last Year: Not on file  Transportation Needs:   . Lack of Transportation (Medical): Not on file  . Lack of Transportation (Non-Medical): Not on file  Physical Activity:   . Days of Exercise per Week: Not on file  . Minutes of Exercise per Session: Not on file  Stress:   . Feeling of Stress : Not on file  Social Connections:   . Frequency of Communication with Friends and Family: Not on file  . Frequency of Social Gatherings with Friends and Family: Not on file  . Attends Religious Services: Not on file  . Active Member of Clubs or Organizations: Not on file  . Attends Archivist Meetings: Not on file  . Marital Status: Not on file  Intimate Partner Violence:   . Fear of Current or Ex-Partner: Not on file  . Emotionally Abused: Not on file  . Physically Abused: Not on file  . Sexually Abused: Not on file     Review of Systems: General: No chills, fever, night sweats or  weight changes  Cardiovascular:  No chest pain, dyspnea on exertion, edema, orthopnea, palpitations, paroxysmal nocturnal dyspnea Dermatological: No rash, lesions or masses Respiratory: No cough, dyspnea Urologic: No hematuria, dysuria Abdominal: No nausea, vomiting, diarrhea, bright red blood per rectum, melena, or hematemesis Neurologic: No visual changes, weakness, changes in mental status All other systems reviewed and are otherwise negative except as noted above.  Physical Exam: Vitals:   01/09/20 1134  BP: 136/78  Pulse: 91  SpO2: 98%  Weight: 226 lb (102.5 kg)  Height: 5\' 10"  (1.778 m)    GEN- The patient  is well appearing, alert and oriented x 3 today.   HEENT: normocephalic, atraumatic; sclera clear, conjunctiva pink; hearing intact; oropharynx clear; neck supple, no JVP Lymph- no cervical lymphadenopathy Lungs- Clear to ausculation bilaterally, normal work of breathing.  No wheezes, rales, rhonchi Heart- Regular rate and rhythm, no murmurs, rubs or gallops, PMI not laterally displaced GI- soft, non-tender, non-distended, bowel sounds present, no hepatosplenomegaly Extremities- no clubbing, cyanosis, or edema; DP/PT/radial pulses 2+ bilaterally MS- no significant deformity or atrophy Skin- warm and dry, no rash or lesion Psych- euthymic mood, full affect Neuro- strength and sensation are intact  EKG is ordered. Personal review of EKG from today shows NSR at 86 bpm, QRS of 98 ms, PR interval 148 ms  Additional studies reviewed include: Previous EP office notes and echoes as below.   Assessment and Plan:  1. Persistent atrial fibrillation s/p ablation 06/2016 Continue eliquis for CHA2DS2VASC of at least 2 . Denies bleeding Recent labs stable (12/27/2019)  2. HTN No change required today.   3. HLD Continue statin per PCP  4. 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 Echo 06/2019 with  normal EF and mild AS.   RTC 08/2020 per patient request. Sooner with symptoms.   Shirley Friar, PA-C  01/09/20 11:41 AM

## 2020-01-09 NOTE — Patient Instructions (Signed)
Medication Instructions:  *If you need a refill on your cardiac medications before your next appointment, please call your pharmacy*  Follow-Up: At [facility], you and your health needs are our priority.  As part of our continuing mission to provide you with exceptional heart care, we have created designated Provider Care Teams.  These Care Teams include your primary Cardiologist (physician) and Advanced Practice Providers (APPs -  Physician Assistants and Nurse Practitioners) who all work together to provide you with the care you need, when you need it.  We recommend signing up for the patient portal called "MyChart".  Sign up information is provided on this After Visit Summary.  MyChart is used to connect with patients for Virtual Visits (Telemedicine).  Patients are able to view lab/test results, encounter notes, upcoming appointments, etc.  Non-urgent messages can be sent to your provider as well.   To learn more about what you can do with MyChart, go to [email or web address].    Your next appointment:   Your physician wants you to follow-up in: June 2022 with Dr. Curt Bears. You will receive a reminder letter in the mail two months in advance. If you don't receive a letter, please call our office to schedule the follow-up appointment.  The format for your next appointment:   In Person with Allegra Lai, MD

## 2020-02-04 ENCOUNTER — Ambulatory Visit
Admission: EM | Admit: 2020-02-04 | Discharge: 2020-02-04 | Disposition: A | Discharge status: Home / Self Care | Payer: Medicare Other | Attending: Emergency Medicine | Admitting: Emergency Medicine

## 2020-02-04 ENCOUNTER — Encounter: Payer: Self-pay | Admitting: Emergency Medicine

## 2020-02-04 DIAGNOSIS — R220 Localized swelling, mass and lump, head: Secondary | ICD-10-CM | POA: Diagnosis not present

## 2020-02-04 MED ORDER — AMOXICILLIN-POT CLAVULANATE 875-125 MG PO TABS: 1.0000 | ORAL_TABLET | Freq: Two times a day (BID) | ORAL | 0 refills | Status: DC

## 2020-02-04 NOTE — Discharge Instructions (Addendum)
Antibiotic prescribed take as directed Rest and increase fluid intake Maintain good oral hygiene care  Use OTC Tylenol/ibuprofen as needed for pain and fever Return or go to the ED if you have any new or worsening symptoms such as fever, chills, difficulty swallowing, painful swallowing, oral or neck swelling, nausea, vomiting, chest pain, SOB, etc..Marland Kitchen

## 2020-02-04 NOTE — ED Triage Notes (Signed)
patient has a swollen lump on right lower jaw, painful to the touch and fever blister on lip.

## 2020-02-04 NOTE — ED Provider Notes (Signed)
[place]   [id number] 02/04/20 Arrival Time: 1117  Chief Complaint  Patient presents with  . Facial Swelling     SUBJECTIVE:  Jon Jackson is a 69 y.o. male presented to the urgent care with a complaint of right lower jaw swelling and painful for the past 2 days.  Denies a precipitating event or trauma.  Localizes pain to the right jaw.  Has tried OTC analgesics without relief.  Worse with chewing.  Denies similar symptoms in the past.  Denies fever, chills, dysphagia, odynophagia, oral or neck swelling, nausea, vomiting, chest pain, SOB.    ROS: As per HPI.  All other pertinent ROS negative.      Past Medical History:  Diagnosis Date  . Anxiety   . Arthritis   . Atrial fibrillation ([place])   . Basal cell carcinoma   . Depression   . GERD (gastroesophageal reflux disease)   . Hyperlipidemia   . Squamous carcinoma    Past Surgical History:  Procedure Laterality Date  . ATRIAL FIBRILLATION ABLATION  07/22/2016  . ATRIAL FIBRILLATION ABLATION N/A 07/22/2016   Procedure: Atrial Fibrillation Ablation;  Surgeon: Will Meredith Leeds, MD;  Location: [place] [facility];  Service: Cardiovascular;  Laterality: N/A;  . BASAL CELL CARCINOMA EXCISION     forehead; top of scalp  . BIOPSY  05/27/2017   Procedure: BIOPSY;  Surgeon: [name], MD;  [facility];  Service: Endoscopy;;  right colon, left colon;  . CARDIOVERSION N/A 04/07/2016   Procedure: CARDIOVERSION;  Surgeon: Satira Sark, MD;  [facility]: [facility];  Service: Cardiovascular;  Laterality: N/A;  . COLECTOMY  1990s   removal of section due to strangulated bowel.  . COLONOSCOPY N/A 05/30/2013   Procedure: COLONOSCOPY;  Surgeon: [name], MD;  [facility];  Service: Endoscopy;  Laterality: N/A;  8:45 AM  . COLONOSCOPY    . COLONOSCOPY N/A 05/27/2017   Procedure: COLONOSCOPY;  Surgeon: [name], MD;  [facility];  Service: Endoscopy;  Laterality: N/A;  1:00pm  .  INGUINAL HERNIA REPAIR Right 1990s  . LUMBAR LAMINECTOMY/DECOMPRESSION MICRODISCECTOMY N/A 12/29/2018   Procedure: Decompressive lumbar laminectomy at L3-L4;  Surgeon: Latanya Maudlin, MD;  Location: [facility];  Service: Orthopedics;  Laterality: N/A;  160min  . SHOULDER ARTHROSCOPY WITH ROTATOR CUFF REPAIR AND SUBACROMIAL DECOMPRESSION Left 05/15/2016   Procedure: LEFT SHOULDER ARTHROSCOPY WITH ROTATOR CUFF REPAIR AND SUBACROMIAL DECOMPRESSION AND DISTAL CLAVICLE RESECTION;  Surgeon: [name], MD;  Location: [place];  Service: Orthopedics;  Laterality: Left;  requests 2hrs  . SHOULDER SURGERY Right   . SQUAMOUS CELL CARCINOMA EXCISION     scalp  . TONSILLECTOMY     No Known Allergies No current facility-administered medications on file prior to encounter.   Current Outpatient Medications on File Prior to Encounter  Medication Sig Dispense Refill  . acetaminophen (TYLENOL) 500 MG tablet Take 1,000-1,500 mg by mouth every 6 (six) hours as needed for moderate pain or headache.     Marland Kitchen apixaban (ELIQUIS) 5 MG TABS tablet Take 1 tablet (5 mg total) by mouth 2 (two) times daily. 180 tablet 3  . buPROPion (WELLBUTRIN SR) 150 MG 12 hr tablet Take 1 tab p.o. am for 14 days, then increase to 1 tab bid. 60 tablet 0  . finasteride (PROSCAR) 5 MG tablet Take 1 tablet (5 mg total) by mouth daily. 90 tablet 0  . ibuprofen (ADVIL,MOTRIN) 200 MG tablet Take 400-600 mg  by mouth every 6 (six) hours as needed for headache or moderate pain.     Marland Kitchen levocetirizine (XYZAL) 5 MG tablet Take 5 mg by mouth daily.    . Melatonin 10 MG TABS Take 10 mg by mouth at bedtime as needed (sleep).    . Multiple Vitamin (MULTIVITAMIN) tablet Take 1 tablet by mouth daily.    [name] Fill (ALLERGY EYE OP) Place 1-2 drops into both eyes 3 (three) times daily as needed (for seasonal allergy/itchy eyes).    . nicotine (NICODERM CQ) 14 mg/24hr patch Place 1 patch (14 mg total) onto the skin daily. 28 patch 2  . omeprazole  (PRILOSEC) 40 MG capsule Take 1 capsule by mouth once daily 90 capsule 3  . simvastatin (ZOCOR) 20 MG tablet Take 10 mg by mouth daily.     Social History   Socioeconomic History  . Marital status: Married    Spouse name: Not on file  . Number of children: Not on file  . Years of education: Not on file  . Highest education level: Not on file  Occupational History  . Not on file  Tobacco Use  . Smoking status: Current Some Day Smoker    Packs/day: 1.00    Years: 47.00    Pack years: 47.00    Types: Cigars  . Smokeless tobacco: Never Used  Vaping Use  . Vaping Use: Never used  Substance and Sexual Activity  . Alcohol use: Yes    Alcohol/week: 12.0 standard drinks    Types: 12 Cans of beer per week    Comment: daily 3-4 in the summer; soemtimes more on weekends.  . Drug use: No  . Sexual activity: Yes  Other Topics Concern  . Not on file  Social History Narrative  . Not on file   Social Determinants of Health   Financial Resource Strain:   . Difficulty of Paying Living Expenses: Not on file  Food Insecurity:   . Worried About Charity fundraiser in the Last Year: Not on file  . Ran Out of Food in the Last Year: Not on file  Transportation Needs:   . Lack of Transportation (Medical): Not on file  . Lack of Transportation (Non-Medical): Not on file  Physical Activity:   . Days of Exercise per Week: Not on file  . Minutes of Exercise per Session: Not on file  Stress:   . Feeling of Stress : Not on file  Social Connections:   . Frequency of Communication with Friends and Family: Not on file  . Frequency of Social Gatherings with Friends and Family: Not on file  . Attends Religious Services: Not on file  . Active Member of Clubs or Organizations: Not on file  . Attends Archivist Meetings: Not on file  . Marital Status: Not on file  Intimate Partner Violence:   . Fear of Current or Ex-Partner: Not on file  . Emotionally Abused: Not on file  . Physically  Abused: Not on file  . Sexually Abused: Not on file   Family History  Problem Relation Age of Onset  . Colon cancer Brother 68  . Throat cancer Brother   . Breast cancer Mother     OBJECTIVE:  Vitals:   02/04/20 1123  BP: (!) 149/95  Pulse: 65  Resp: 16  Temp: 99.2 F (37.3 C)  SpO2: 98%    Physical Exam Vitals and nursing note reviewed.  Constitutional:      General: He is  not in acute distress.    Appearance: Normal appearance. He is normal weight. He is not ill-appearing, toxic-appearing or diaphoretic.  HENT:     Head: Normocephalic.     Jaw: Swelling present.      Right Ear: Tympanic membrane, ear canal and external ear normal. There is no impacted cerumen.     Left Ear: Tympanic membrane, ear canal and external ear normal. There is no impacted cerumen.     Mouth/Throat:     Mouth: Mucous membranes are moist. Oral lesions present.     Dentition: Abnormal dentition. No dental tenderness, gingival swelling, dental caries or dental abscesses.  Cardiovascular:     Rate and Rhythm: Normal rate and regular rhythm.     Pulses: Normal pulses.     Heart sounds: Normal heart sounds. No murmur heard.  No friction rub. No gallop.   Pulmonary:     Effort: Pulmonary effort is normal. No respiratory distress.     Breath sounds: Normal breath sounds. No stridor. No wheezing, rhonchi or rales.  Chest:     Chest wall: No tenderness.  Neurological:     Mental Status: He is alert and oriented to person, place, and time.     ASSESSMENT & PLAN:  1. Jaw swelling     Meds ordered this encounter  Medications  . amoxicillin-clavulanate (AUGMENTIN) 875-125 MG tablet    Sig: Take 1 tablet by mouth every 12 (twelve) hours.    Dispense:  14 tablet    Refill:  0    Discharge instructions  Antibiotic prescribed take as directed Rest and increase fluid intake Maintain good oral hygiene care  Use OTC Tylenol/ibuprofen as needed for pain or fever Return or go to the ED if you  have any new or worsening symptoms such as fever, chills, difficulty swallowing, painful swallowing, oral or neck swelling, nausea, vomiting, chest pain, SOB, etc...  Reviewed expectations re: course of current medical issues. Questions answered. Outlined signs and symptoms indicating need for more acute intervention. Patient verbalized understanding. After Visit Summary given.   Emerson Monte, FNP 02/04/20 1203

## 2020-02-06 ENCOUNTER — Telehealth: Payer: Self-pay | Admitting: Family Medicine

## 2020-02-06 MED ORDER — OMEPRAZOLE 40 MG PO CPDR
40.0000 mg | DELAYED_RELEASE_CAPSULE | Freq: Every day | ORAL | 1 refills | Status: DC
Start: 2020-02-06 — End: 2020-06-08

## 2020-02-06 NOTE — Telephone Encounter (Signed)
[facility] requesting refill on Omeprazole 40 mg. Take one capsule po once daily. Pt last seen 12/27/19. Please advise. Thank you.

## 2020-04-03 DIAGNOSIS — M5459 Other low back pain: Secondary | ICD-10-CM | POA: Diagnosis not present

## 2020-04-24 ENCOUNTER — Other Ambulatory Visit: Payer: Self-pay

## 2020-04-24 ENCOUNTER — Encounter: Payer: Self-pay | Admitting: Emergency Medicine

## 2020-04-24 ENCOUNTER — Ambulatory Visit (INDEPENDENT_AMBULATORY_CARE_PROVIDER_SITE_OTHER): Payer: Medicare Other

## 2020-04-24 ENCOUNTER — Ambulatory Visit
Admission: EM | Admit: 2020-04-24 | Discharge: 2020-04-24 | Disposition: A | Discharge status: Home / Self Care | Payer: Medicare Other | Attending: Emergency Medicine | Admitting: Emergency Medicine

## 2020-04-24 DIAGNOSIS — S2242XA Multiple fractures of ribs, left side, initial encounter for closed fracture: Secondary | ICD-10-CM

## 2020-04-24 DIAGNOSIS — M549 Dorsalgia, unspecified: Secondary | ICD-10-CM | POA: Diagnosis not present

## 2020-04-24 DIAGNOSIS — W19XXXA Unspecified fall, initial encounter: Secondary | ICD-10-CM

## 2020-04-24 DIAGNOSIS — R0781 Pleurodynia: Secondary | ICD-10-CM | POA: Diagnosis not present

## 2020-04-24 DIAGNOSIS — R079 Chest pain, unspecified: Secondary | ICD-10-CM | POA: Diagnosis not present

## 2020-04-24 MED ORDER — ACETAMINOPHEN 500 MG PO TABS: 500.0000 mg | ORAL_TABLET | Freq: Four times a day (QID) | ORAL | 0 refills | Status: DC | PRN

## 2020-04-24 MED ORDER — TRAMADOL HCL 50 MG PO TABS: 50.0000 mg | ORAL_TABLET | Freq: Two times a day (BID) | ORAL | 0 refills | Status: DC | PRN

## 2020-04-24 NOTE — ED Triage Notes (Signed)
Slipped on ice Sunday morning hit LT side.  C/o pain with movement to LT side and LT upper back since fall.

## 2020-04-24 NOTE — Discharge Instructions (Addendum)
Tylenol 500 mg was prescribed for mild to moderate pain Tramadol was prescribed for severe pain' Follow RICE instruction that is attached Follow-up with orthopedic/PCP Return or go to ED if you develop any new or worsening of his symptoms

## 2020-04-24 NOTE — ED Provider Notes (Signed)
[place]   [id number] 04/24/20 Arrival Time: 19   Chief Complaint  Patient presents with  . Rib Injury     SUBJECTIVE: History from: patient.  Jon Jackson is a 70 y.o. male who presented to the urgent care with a complaint of left-sided rib cage and left upper back pain that occurred on Sunday morning.  Developed the symptom after slipping and falling on ice.  He localizes the pain to the left chest and back.  He describes the pain as constant and achy.  He has tried OTC medications without relief.  His symptoms are made worse with movement and respiration.  He denies similar symptoms in the past.  Denies chills, fever, nausea, vomiting, diarrhea   ROS: As per HPI.  All other pertinent ROS negative.     Past Medical History:  Diagnosis Date  . Anxiety   . Arthritis   . Atrial fibrillation ([place])   . Basal cell carcinoma   . Depression   . GERD (gastroesophageal reflux disease)   . Hyperlipidemia   . Squamous carcinoma    Past Surgical History:  Procedure Laterality Date  . ATRIAL FIBRILLATION ABLATION  07/22/2016  . ATRIAL FIBRILLATION ABLATION N/A 07/22/2016   Procedure: Atrial Fibrillation Ablation;  Surgeon: Will Meredith Leeds, MD;  Location: [place] [facility];  Service: Cardiovascular;  Laterality: N/A;  . BASAL CELL CARCINOMA EXCISION     forehead; top of scalp  . BIOPSY  05/27/2017   Procedure: BIOPSY;  Surgeon: [name], MD;  [facility];  Service: Endoscopy;;  right colon, left colon;  . CARDIOVERSION N/A 04/07/2016   Procedure: CARDIOVERSION;  Surgeon: Satira Sark, MD;  [facility];  Service: Cardiovascular;  Laterality: N/A;  . COLECTOMY  1990s   removal of section due to strangulated bowel.  . COLONOSCOPY N/A 05/30/2013   Procedure: COLONOSCOPY;  Surgeon: [name], MD;  [facility];  Service: Endoscopy;  Laterality: N/A;  8:45 AM  . COLONOSCOPY    . COLONOSCOPY N/A 05/27/2017   Procedure:  COLONOSCOPY;  Surgeon: [name], MD;  [facility];  Service: Endoscopy;  Laterality: N/A;  1:00pm  . INGUINAL HERNIA REPAIR Right 1990s  . LUMBAR LAMINECTOMY/DECOMPRESSION MICRODISCECTOMY N/A 12/29/2018   Procedure: Decompressive lumbar laminectomy at L3-L4;  Surgeon: Latanya Maudlin, MD;  [facility];  Service: Orthopedics;  Laterality: N/A;  174min  . SHOULDER ARTHROSCOPY WITH ROTATOR CUFF REPAIR AND SUBACROMIAL DECOMPRESSION Left 05/15/2016   Procedure: LEFT SHOULDER ARTHROSCOPY WITH ROTATOR CUFF REPAIR AND SUBACROMIAL DECOMPRESSION AND DISTAL CLAVICLE RESECTION;  Surgeon: [name], MD;  Location: [place];  Service: Orthopedics;  Laterality: Left;  requests 2hrs  . SHOULDER SURGERY Right   . SQUAMOUS CELL CARCINOMA EXCISION     scalp  . TONSILLECTOMY     No Known Allergies No current facility-administered medications on file prior to encounter.   Current Outpatient Medications on File Prior to Encounter  Medication Sig Dispense Refill  . amoxicillin-clavulanate (AUGMENTIN) 875-125 MG tablet Take 1 tablet by mouth every 12 (twelve) hours. 14 tablet 0  . apixaban (ELIQUIS) 5 MG TABS tablet Take 1 tablet (5 mg total) by mouth 2 (two) times daily. 180 tablet 3  . buPROPion (WELLBUTRIN SR) 150 MG 12 hr tablet Take 1 tab p.o. am for 14 days, then increase to 1 tab bid. 60 tablet 0  . finasteride (PROSCAR) 5 MG tablet Take 1 tablet (5 mg total) by mouth  daily. 90 tablet 0  . ibuprofen (ADVIL,MOTRIN) 200 MG tablet Take 400-600 mg by mouth every 6 (six) hours as needed for headache or moderate pain.     Marland Kitchen levocetirizine (XYZAL) 5 MG tablet Take 5 mg by mouth daily.    . Melatonin 10 MG TABS Take 10 mg by mouth at bedtime as needed (sleep).    . Multiple Vitamin (MULTIVITAMIN) tablet Take 1 tablet by mouth daily.    [name] Fill (ALLERGY EYE OP) Place 1-2 drops into both eyes 3 (three) times daily as needed (for seasonal allergy/itchy eyes).    . nicotine  (NICODERM CQ) 14 mg/24hr patch Place 1 patch (14 mg total) onto the skin daily. 28 patch 2  . omeprazole (PRILOSEC) 40 MG capsule Take 1 capsule (40 mg total) by mouth daily. 90 capsule 1  . simvastatin (ZOCOR) 20 MG tablet Take 10 mg by mouth daily.     Social History   Socioeconomic History  . Marital status: Married    Spouse name: Not on file  . Number of children: Not on file  . Years of education: Not on file  . Highest education level: Not on file  Occupational History  . Not on file  Tobacco Use  . Smoking status: Current Some Day Smoker    Packs/day: 1.00    Years: 47.00    Pack years: 47.00    Types: Cigars  . Smokeless tobacco: Never Used  Vaping Use  . Vaping Use: Never used  Substance and Sexual Activity  . Alcohol use: Yes    Alcohol/week: 12.0 standard drinks    Types: 12 Cans of beer per week    Comment: daily 3-4 in the summer; soemtimes more on weekends.  . Drug use: No  . Sexual activity: Yes  Other Topics Concern  . Not on file  Social History Narrative  . Not on file   Social Determinants of Health   Financial Resource Strain: Not on file  Food Insecurity: Not on file  Transportation Needs: Not on file  Physical Activity: Not on file  Stress: Not on file  Social Connections: Not on file  Intimate Partner Violence: Not on file   Family History  Problem Relation Age of Onset  . Colon cancer Brother 23  . Throat cancer Brother   . Breast cancer Mother     OBJECTIVE:  Vitals:   04/24/20 1358  BP: 134/85  Pulse: 83  Resp: 19  Temp: 98.6 F (37 C)  TempSrc: Oral  SpO2: 93%     Physical Exam Vitals and nursing note reviewed.  Constitutional:      General: He is not in acute distress.    Appearance: Normal appearance. He is normal weight. He is not ill-appearing, toxic-appearing or diaphoretic.  Cardiovascular:     Rate and Rhythm: Normal rate and regular rhythm.     Pulses: Normal pulses.     Heart sounds: Normal heart sounds. No  murmur heard. No friction rub. No gallop.   Pulmonary:     Effort: Pulmonary effort is normal. No respiratory distress.     Breath sounds: Normal breath sounds. No stridor. No wheezing, rhonchi or rales.  Chest:     Chest wall: No tenderness.  Musculoskeletal:        General: Tenderness present.     Cervical back: Spasms and tenderness present. No swelling, edema, deformity, erythema, signs of trauma, lacerations, rigidity, torticollis or bony tenderness.     Comments: Left rib cage  is without any deformity when compared to the right rib cage.  Tenderness to palpation of the rib cage.  There is no ecchymosis, open wound, lesion, or swelling present.  Neurological:     Mental Status: He is alert and oriented to person, place, and time.     LABS:  No results found for this or any previous visit (from the past 24 hour(s)).   RADIOLOGY:  DG Chest 2 View  Result Date: 04/24/2020 CLINICAL DATA:  Pain post recent fall EXAM: CHEST - 2 VIEW COMPARISON:  None. FINDINGS: Low lung volumes. Likely mild chronic interstitial prominence. Bibasilar scarring/atelectasis. No significant pleural effusion. No pneumothorax. Cardiomediastinal contours are within normal limits. Decreased osseous mineralization with age-indeterminate mild loss of height of thoracic vertebral bodies. Fractures of the posterior left fifth and sixth ribs. IMPRESSION: Fractures of the posterior left fifth and sixth ribs. No pneumothorax. Fifth rib fracture may be acute and sixth rib fracture may be chronic. Bibasilar scarring/atelectasis. Electronically Signed   By: Macy Mis M.D.   On: 04/24/2020 14:31   Chest x -ray is positive for acute fifth rib fracture and chronic sixth rib fracture.  I have reviewed the x-ray myself and the radiologist interpretation.  I am in agreement with the radiologist interpretation.   ASSESSMENT & PLAN:  1. Fall, initial encounter   2. Rib pain   3. Upper back pain on left side   4. Closed  fracture of multiple ribs of left side, initial encounter     Meds ordered this encounter  Medications  . acetaminophen (TYLENOL) 500 MG tablet    Sig: Take 1 tablet (500 mg total) by mouth every 6 (six) hours as needed.    Dispense:  30 tablet    Refill:  0  . traMADol (ULTRAM) 50 MG tablet    Sig: Take 1 tablet (50 mg total) by mouth every 12 (twelve) hours as needed for severe pain.    Dispense:  8 tablet    Refill:  0    Discharge Instructions  Tylenol 500 mg was prescribed for mild to moderate pain Tramadol was prescribed for severe pain' Follow RICE instruction that is attached Follow-up with orthopedic/PCP Return or go to ED if you develop any new or worsening of his symptoms  Reviewed expectations re: course of current medical issues. Questions answered. Outlined signs and symptoms indicating need for more acute intervention. Patient verbalized understanding. After Visit Summary given.    PDMP reviewed during this encounter.      Emerson Monte, FNP 04/24/20 1500

## 2020-04-26 DIAGNOSIS — L570 Actinic keratosis: Secondary | ICD-10-CM | POA: Diagnosis not present

## 2020-04-30 DIAGNOSIS — M4180 Other forms of scoliosis, site unspecified: Secondary | ICD-10-CM | POA: Diagnosis not present

## 2020-04-30 DIAGNOSIS — M48061 Spinal stenosis, lumbar region without neurogenic claudication: Secondary | ICD-10-CM | POA: Diagnosis not present

## 2020-05-02 DIAGNOSIS — I4891 Unspecified atrial fibrillation: Secondary | ICD-10-CM | POA: Diagnosis not present

## 2020-05-02 DIAGNOSIS — M961 Postlaminectomy syndrome, not elsewhere classified: Secondary | ICD-10-CM | POA: Diagnosis not present

## 2020-05-02 DIAGNOSIS — D6869 Other thrombophilia: Secondary | ICD-10-CM | POA: Diagnosis not present

## 2020-05-02 DIAGNOSIS — M5416 Radiculopathy, lumbar region: Secondary | ICD-10-CM | POA: Diagnosis not present

## 2020-05-07 DIAGNOSIS — Z23 Encounter for immunization: Secondary | ICD-10-CM | POA: Diagnosis not present

## 2020-05-14 DIAGNOSIS — M48061 Spinal stenosis, lumbar region without neurogenic claudication: Secondary | ICD-10-CM | POA: Diagnosis not present

## 2020-05-14 DIAGNOSIS — M545 Low back pain, unspecified: Secondary | ICD-10-CM | POA: Diagnosis not present

## 2020-05-14 DIAGNOSIS — R531 Weakness: Secondary | ICD-10-CM | POA: Diagnosis not present

## 2020-05-14 DIAGNOSIS — R293 Abnormal posture: Secondary | ICD-10-CM | POA: Diagnosis not present

## 2020-05-14 DIAGNOSIS — R2689 Other abnormalities of gait and mobility: Secondary | ICD-10-CM | POA: Diagnosis not present

## 2020-05-15 DIAGNOSIS — M48061 Spinal stenosis, lumbar region without neurogenic claudication: Secondary | ICD-10-CM | POA: Diagnosis not present

## 2020-05-15 DIAGNOSIS — R2689 Other abnormalities of gait and mobility: Secondary | ICD-10-CM | POA: Diagnosis not present

## 2020-05-15 DIAGNOSIS — R293 Abnormal posture: Secondary | ICD-10-CM | POA: Diagnosis not present

## 2020-05-15 DIAGNOSIS — M545 Low back pain, unspecified: Secondary | ICD-10-CM | POA: Diagnosis not present

## 2020-05-15 DIAGNOSIS — R531 Weakness: Secondary | ICD-10-CM | POA: Diagnosis not present

## 2020-05-17 DIAGNOSIS — M545 Low back pain, unspecified: Secondary | ICD-10-CM | POA: Diagnosis not present

## 2020-05-17 DIAGNOSIS — R531 Weakness: Secondary | ICD-10-CM | POA: Diagnosis not present

## 2020-05-17 DIAGNOSIS — R2689 Other abnormalities of gait and mobility: Secondary | ICD-10-CM | POA: Diagnosis not present

## 2020-05-17 DIAGNOSIS — M48061 Spinal stenosis, lumbar region without neurogenic claudication: Secondary | ICD-10-CM | POA: Diagnosis not present

## 2020-05-17 DIAGNOSIS — R293 Abnormal posture: Secondary | ICD-10-CM | POA: Diagnosis not present

## 2020-05-21 DIAGNOSIS — M545 Low back pain, unspecified: Secondary | ICD-10-CM | POA: Diagnosis not present

## 2020-05-21 DIAGNOSIS — R293 Abnormal posture: Secondary | ICD-10-CM | POA: Diagnosis not present

## 2020-05-21 DIAGNOSIS — R531 Weakness: Secondary | ICD-10-CM | POA: Diagnosis not present

## 2020-05-21 DIAGNOSIS — R2689 Other abnormalities of gait and mobility: Secondary | ICD-10-CM | POA: Diagnosis not present

## 2020-05-21 DIAGNOSIS — M48061 Spinal stenosis, lumbar region without neurogenic claudication: Secondary | ICD-10-CM | POA: Diagnosis not present

## 2020-05-22 DIAGNOSIS — M5416 Radiculopathy, lumbar region: Secondary | ICD-10-CM | POA: Diagnosis not present

## 2020-05-29 DIAGNOSIS — M48061 Spinal stenosis, lumbar region without neurogenic claudication: Secondary | ICD-10-CM | POA: Diagnosis not present

## 2020-05-29 DIAGNOSIS — R293 Abnormal posture: Secondary | ICD-10-CM | POA: Diagnosis not present

## 2020-05-29 DIAGNOSIS — R2689 Other abnormalities of gait and mobility: Secondary | ICD-10-CM | POA: Diagnosis not present

## 2020-05-29 DIAGNOSIS — R531 Weakness: Secondary | ICD-10-CM | POA: Diagnosis not present

## 2020-05-29 DIAGNOSIS — M545 Low back pain, unspecified: Secondary | ICD-10-CM | POA: Diagnosis not present

## 2020-05-31 DIAGNOSIS — M48061 Spinal stenosis, lumbar region without neurogenic claudication: Secondary | ICD-10-CM | POA: Diagnosis not present

## 2020-05-31 DIAGNOSIS — R531 Weakness: Secondary | ICD-10-CM | POA: Diagnosis not present

## 2020-05-31 DIAGNOSIS — R293 Abnormal posture: Secondary | ICD-10-CM | POA: Diagnosis not present

## 2020-05-31 DIAGNOSIS — M545 Low back pain, unspecified: Secondary | ICD-10-CM | POA: Diagnosis not present

## 2020-05-31 DIAGNOSIS — R2689 Other abnormalities of gait and mobility: Secondary | ICD-10-CM | POA: Diagnosis not present

## 2020-06-05 DIAGNOSIS — M961 Postlaminectomy syndrome, not elsewhere classified: Secondary | ICD-10-CM | POA: Diagnosis not present

## 2020-06-05 DIAGNOSIS — R293 Abnormal posture: Secondary | ICD-10-CM | POA: Diagnosis not present

## 2020-06-05 DIAGNOSIS — M545 Low back pain, unspecified: Secondary | ICD-10-CM | POA: Diagnosis not present

## 2020-06-05 DIAGNOSIS — M48061 Spinal stenosis, lumbar region without neurogenic claudication: Secondary | ICD-10-CM | POA: Diagnosis not present

## 2020-06-05 DIAGNOSIS — M5416 Radiculopathy, lumbar region: Secondary | ICD-10-CM | POA: Diagnosis not present

## 2020-06-05 DIAGNOSIS — R531 Weakness: Secondary | ICD-10-CM | POA: Diagnosis not present

## 2020-06-05 DIAGNOSIS — R2689 Other abnormalities of gait and mobility: Secondary | ICD-10-CM | POA: Diagnosis not present

## 2020-06-07 DIAGNOSIS — R531 Weakness: Secondary | ICD-10-CM | POA: Diagnosis not present

## 2020-06-07 DIAGNOSIS — R293 Abnormal posture: Secondary | ICD-10-CM | POA: Diagnosis not present

## 2020-06-07 DIAGNOSIS — M48061 Spinal stenosis, lumbar region without neurogenic claudication: Secondary | ICD-10-CM | POA: Diagnosis not present

## 2020-06-07 DIAGNOSIS — M545 Low back pain, unspecified: Secondary | ICD-10-CM | POA: Diagnosis not present

## 2020-06-07 DIAGNOSIS — R2689 Other abnormalities of gait and mobility: Secondary | ICD-10-CM | POA: Diagnosis not present

## 2020-06-08 ENCOUNTER — Other Ambulatory Visit: Payer: Self-pay | Admitting: Family Medicine

## 2020-06-11 DIAGNOSIS — M48061 Spinal stenosis, lumbar region without neurogenic claudication: Secondary | ICD-10-CM | POA: Diagnosis not present

## 2020-06-11 DIAGNOSIS — R531 Weakness: Secondary | ICD-10-CM | POA: Diagnosis not present

## 2020-06-11 DIAGNOSIS — R2689 Other abnormalities of gait and mobility: Secondary | ICD-10-CM | POA: Diagnosis not present

## 2020-06-11 DIAGNOSIS — R293 Abnormal posture: Secondary | ICD-10-CM | POA: Diagnosis not present

## 2020-06-11 DIAGNOSIS — M545 Low back pain, unspecified: Secondary | ICD-10-CM | POA: Diagnosis not present

## 2020-06-15 DIAGNOSIS — M961 Postlaminectomy syndrome, not elsewhere classified: Secondary | ICD-10-CM | POA: Diagnosis not present

## 2020-06-15 DIAGNOSIS — R531 Weakness: Secondary | ICD-10-CM | POA: Diagnosis not present

## 2020-06-15 DIAGNOSIS — M5416 Radiculopathy, lumbar region: Secondary | ICD-10-CM | POA: Diagnosis not present

## 2020-06-15 DIAGNOSIS — M545 Low back pain, unspecified: Secondary | ICD-10-CM | POA: Diagnosis not present

## 2020-06-15 DIAGNOSIS — R293 Abnormal posture: Secondary | ICD-10-CM | POA: Diagnosis not present

## 2020-06-15 DIAGNOSIS — R2689 Other abnormalities of gait and mobility: Secondary | ICD-10-CM | POA: Diagnosis not present

## 2020-06-15 DIAGNOSIS — M48061 Spinal stenosis, lumbar region without neurogenic claudication: Secondary | ICD-10-CM | POA: Diagnosis not present

## 2020-06-19 DIAGNOSIS — M48061 Spinal stenosis, lumbar region without neurogenic claudication: Secondary | ICD-10-CM | POA: Diagnosis not present

## 2020-06-19 DIAGNOSIS — R293 Abnormal posture: Secondary | ICD-10-CM | POA: Diagnosis not present

## 2020-06-19 DIAGNOSIS — R531 Weakness: Secondary | ICD-10-CM | POA: Diagnosis not present

## 2020-06-19 DIAGNOSIS — M545 Low back pain, unspecified: Secondary | ICD-10-CM | POA: Diagnosis not present

## 2020-06-19 DIAGNOSIS — R2689 Other abnormalities of gait and mobility: Secondary | ICD-10-CM | POA: Diagnosis not present

## 2020-06-21 DIAGNOSIS — R2689 Other abnormalities of gait and mobility: Secondary | ICD-10-CM | POA: Diagnosis not present

## 2020-06-21 DIAGNOSIS — R531 Weakness: Secondary | ICD-10-CM | POA: Diagnosis not present

## 2020-06-21 DIAGNOSIS — M48061 Spinal stenosis, lumbar region without neurogenic claudication: Secondary | ICD-10-CM | POA: Diagnosis not present

## 2020-06-21 DIAGNOSIS — M545 Low back pain, unspecified: Secondary | ICD-10-CM | POA: Diagnosis not present

## 2020-06-21 DIAGNOSIS — R293 Abnormal posture: Secondary | ICD-10-CM | POA: Diagnosis not present

## 2020-06-25 ENCOUNTER — Other Ambulatory Visit: Payer: Self-pay

## 2020-06-25 ENCOUNTER — Ambulatory Visit (INDEPENDENT_AMBULATORY_CARE_PROVIDER_SITE_OTHER): Payer: Medicare Other | Admitting: Family Medicine

## 2020-06-25 ENCOUNTER — Encounter: Payer: Self-pay | Admitting: Family Medicine

## 2020-06-25 VITALS — BP 130/84 | HR 89 | Temp 98.6°F | Ht 70.0 in | Wt 223.0 lb

## 2020-06-25 DIAGNOSIS — I4819 Other persistent atrial fibrillation: Secondary | ICD-10-CM | POA: Diagnosis not present

## 2020-06-25 DIAGNOSIS — E785 Hyperlipidemia, unspecified: Secondary | ICD-10-CM

## 2020-06-25 DIAGNOSIS — F320 Major depressive disorder, single episode, mild: Secondary | ICD-10-CM

## 2020-06-25 DIAGNOSIS — I10 Essential (primary) hypertension: Secondary | ICD-10-CM

## 2020-06-25 MED ORDER — CITALOPRAM HYDROBROMIDE 20 MG PO TABS: ORAL_TABLET | ORAL | 2 refills | Status: DC

## 2020-06-25 MED ORDER — SIMVASTATIN 20 MG PO TABS: 10.0000 mg | ORAL_TABLET | Freq: Every day | ORAL | 1 refills | Status: DC

## 2020-06-25 NOTE — Progress Notes (Signed)
Patient ID: Jon Jackson, male    DOB: [birth year], 70 y.o.   MRN: [id number]   Chief Complaint  Patient presents with  . Hypertension   Subjective:    HPI  CC-htn check up.   Would like to restart celexa.   Would like to restart simvastatin. Ran out of refills.   Pt just turned 46, feeling more down and tearful lately.  pt stating didn't feel good on wellbutrin.  Feeing more emotional recently. Not feeling depressed, but just tearful and cry even at happy things. Memories of people that passed.  Old friends thinking about them that passed. Has support at home and lives with spouse.  Seeing Dr. Curt Jackson with cards. Seeing them soon for follow up. Taking eliquis for Afib. Had an ablation.  Had tramadol on list. Using ibuprofen. Had a broken rib in past. H/o scoliosis and stenosis, seeing [facility] for pain management. Doing PT also. Has had injection in 6 wks ago and pain is worse in am. Used to have left leg weakness.   Medical History Jon Jackson has a past medical history of Anxiety, Arthritis, Atrial fibrillation ([place]), Basal cell carcinoma, Depression, GERD (gastroesophageal reflux disease), Hyperlipidemia, and Squamous carcinoma.   Outpatient Encounter Medications as of 06/25/2020  Medication Sig  . acetaminophen (TYLENOL) 500 MG tablet Take 1 tablet (500 mg total) by mouth every 6 (six) hours as needed.  Marland Kitchen apixaban (ELIQUIS) 5 MG TABS tablet Take 1 tablet (5 mg total) by mouth 2 (two) times daily.  . citalopram (CELEXA) 20 MG tablet Take 1/2 tab p.o. for 7 days, then increase to 1 tab daily  . ibuprofen (ADVIL,MOTRIN) 200 MG tablet Take 400-600 mg by mouth every 6 (six) hours as needed for headache or moderate pain.   Marland Kitchen levocetirizine (XYZAL) 5 MG tablet Take 5 mg by mouth daily.  . Melatonin 10 MG TABS Take 10 mg by mouth at bedtime as needed (sleep).  . Multiple Vitamin (MULTIVITAMIN) tablet Take 1 tablet by mouth daily.  Jon Jackson Fill (ALLERGY  EYE OP) Place 1-2 drops into both eyes 3 (three) times daily as needed (for seasonal allergy/itchy eyes).  Marland Kitchen omeprazole (PRILOSEC) 40 MG capsule Take 1 capsule by mouth once daily  . simvastatin (ZOCOR) 20 MG tablet Take 0.5 tablets (10 mg total) by mouth daily.  . [DISCONTINUED] amoxicillin-clavulanate (AUGMENTIN) 875-125 MG tablet Take 1 tablet by mouth every 12 (twelve) hours.  . [DISCONTINUED] buPROPion (WELLBUTRIN SR) 150 MG 12 hr tablet Take 1 tab p.o. am for 14 days, then increase to 1 tab bid.  . [DISCONTINUED] finasteride (PROSCAR) 5 MG tablet Take 1 tablet (5 mg total) by mouth daily.  . [DISCONTINUED] nicotine (NICODERM CQ) 14 mg/24hr patch Place 1 patch (14 mg total) onto the skin daily.  . [DISCONTINUED] simvastatin (ZOCOR) 20 MG tablet Take 10 mg by mouth daily. (Patient not taking: Reported on 06/25/2020)  . [DISCONTINUED] traMADol (ULTRAM) 50 MG tablet Take 1 tablet (50 mg total) by mouth every 12 (twelve) hours as needed for severe pain.   No facility-administered encounter medications on file as of 06/25/2020.     Review of Systems  Constitutional: Negative for chills and fever.  HENT: Negative for congestion, rhinorrhea and sore throat.   Respiratory: Negative for cough, shortness of breath and wheezing.   Cardiovascular: Negative for chest pain and leg swelling.  Gastrointestinal: Negative for abdominal pain, diarrhea, nausea and vomiting.  Genitourinary: Negative for dysuria and frequency.  Skin: Negative for rash.  Neurological:  Negative for dizziness, weakness and headaches.  Psychiatric/Behavioral: Positive for dysphoric mood. Negative for self-injury, sleep disturbance and suicidal ideas. The patient is nervous/anxious.      Vitals BP 130/84   Pulse 89   Temp 98.6 F (37 C)   Ht 5\' 10"  (1.778 m)   Wt 223 lb (101.2 kg)   SpO2 97%   BMI 32.00 kg/m   Objective:   Physical Exam Vitals and nursing note reviewed.  Constitutional:      General: He is not in  acute distress.    Appearance: Normal appearance. He is not ill-appearing.  HENT:     Head: Normocephalic.     Nose: Nose normal. No congestion.     Mouth/Throat:     Mouth: Mucous membranes are moist.     Pharynx: No oropharyngeal exudate.  Eyes:     Extraocular Movements: Extraocular movements intact.     Conjunctiva/sclera: Conjunctivae normal.     Pupils: Pupils are equal, round, and reactive to light.  Cardiovascular:     Rate and Rhythm: Normal rate. Rhythm irregular.     Pulses: Normal pulses.     Heart sounds: Normal heart sounds. No murmur heard.   Pulmonary:     Effort: Pulmonary effort is normal. No respiratory distress.     Breath sounds: Normal breath sounds. No wheezing, rhonchi or rales.  Musculoskeletal:        General: Normal range of motion.     Right lower leg: No edema.     Left lower leg: No edema.  Skin:    General: Skin is warm and dry.     Findings: No rash.  Neurological:     General: No focal deficit present.     Mental Status: He is alert and oriented to person, place, and time.     Cranial Nerves: No cranial nerve deficit.  Psychiatric:        Behavior: Behavior normal.        Thought Content: Thought content normal.        Judgment: Judgment normal.     Comments: +tearful on exam    Assessment and Plan   1. Current mild episode of major depressive disorder, unspecified whether recurrent (HCC) - citalopram (CELEXA) 20 MG tablet; Take 1/2 tab p.o. for 7 days, then increase to 1 tab daily  Dispense: 30 tablet; Refill: 2  2. Persistent atrial fibrillation ([place])  3. Essential hypertension  4. Hyperlipidemia LDL goal <130 - simvastatin (ZOCOR) 20 MG tablet; Take 0.5 tablets (10 mg total) by mouth daily.  Dispense: 90 tablet; Refill: 1    Will get labs on next visit. Chronic lumbar pain/stenosis- 500mg -650mg  tylenol every 6hrs. F/u with ortho for stronger pain medications. Pt not tolerating tramadol.  Chronic afib- stable. Cont eliquis.  Cont f/u with cards.  Htn-stable. Cont meds.  hld- stable. Cont med.  Depression- worsening. will restart celexa.  Return in about 6 months (around 12/26/2020) for f/u depression/HLD.  07/01/2020

## 2020-06-26 DIAGNOSIS — R293 Abnormal posture: Secondary | ICD-10-CM | POA: Diagnosis not present

## 2020-06-26 DIAGNOSIS — R531 Weakness: Secondary | ICD-10-CM | POA: Diagnosis not present

## 2020-06-26 DIAGNOSIS — M48061 Spinal stenosis, lumbar region without neurogenic claudication: Secondary | ICD-10-CM | POA: Diagnosis not present

## 2020-06-26 DIAGNOSIS — R2689 Other abnormalities of gait and mobility: Secondary | ICD-10-CM | POA: Diagnosis not present

## 2020-06-26 DIAGNOSIS — M545 Low back pain, unspecified: Secondary | ICD-10-CM | POA: Diagnosis not present

## 2020-06-29 DIAGNOSIS — M48062 Spinal stenosis, lumbar region with neurogenic claudication: Secondary | ICD-10-CM | POA: Diagnosis not present

## 2020-07-01 ENCOUNTER — Encounter: Payer: Self-pay | Admitting: Family Medicine

## 2020-07-04 DIAGNOSIS — L570 Actinic keratosis: Secondary | ICD-10-CM | POA: Diagnosis not present

## 2020-07-05 DIAGNOSIS — R2689 Other abnormalities of gait and mobility: Secondary | ICD-10-CM | POA: Diagnosis not present

## 2020-07-05 DIAGNOSIS — M545 Low back pain, unspecified: Secondary | ICD-10-CM | POA: Diagnosis not present

## 2020-07-05 DIAGNOSIS — R293 Abnormal posture: Secondary | ICD-10-CM | POA: Diagnosis not present

## 2020-07-05 DIAGNOSIS — R531 Weakness: Secondary | ICD-10-CM | POA: Diagnosis not present

## 2020-07-05 DIAGNOSIS — M48061 Spinal stenosis, lumbar region without neurogenic claudication: Secondary | ICD-10-CM | POA: Diagnosis not present

## 2020-07-10 DIAGNOSIS — M4180 Other forms of scoliosis, site unspecified: Secondary | ICD-10-CM | POA: Diagnosis not present

## 2020-07-10 DIAGNOSIS — M48062 Spinal stenosis, lumbar region with neurogenic claudication: Secondary | ICD-10-CM | POA: Diagnosis not present

## 2020-07-10 DIAGNOSIS — M961 Postlaminectomy syndrome, not elsewhere classified: Secondary | ICD-10-CM | POA: Diagnosis not present

## 2020-07-11 DIAGNOSIS — R293 Abnormal posture: Secondary | ICD-10-CM | POA: Diagnosis not present

## 2020-07-11 DIAGNOSIS — R531 Weakness: Secondary | ICD-10-CM | POA: Diagnosis not present

## 2020-07-11 DIAGNOSIS — R2689 Other abnormalities of gait and mobility: Secondary | ICD-10-CM | POA: Diagnosis not present

## 2020-07-11 DIAGNOSIS — M48061 Spinal stenosis, lumbar region without neurogenic claudication: Secondary | ICD-10-CM | POA: Diagnosis not present

## 2020-07-11 DIAGNOSIS — M545 Low back pain, unspecified: Secondary | ICD-10-CM | POA: Diagnosis not present

## 2020-07-26 DIAGNOSIS — M5416 Radiculopathy, lumbar region: Secondary | ICD-10-CM | POA: Diagnosis not present

## 2020-08-09 DIAGNOSIS — M5416 Radiculopathy, lumbar region: Secondary | ICD-10-CM | POA: Diagnosis not present

## 2020-08-09 DIAGNOSIS — M961 Postlaminectomy syndrome, not elsewhere classified: Secondary | ICD-10-CM | POA: Diagnosis not present

## 2020-08-11 ENCOUNTER — Other Ambulatory Visit: Payer: Self-pay | Admitting: Family Medicine

## 2020-09-06 ENCOUNTER — Other Ambulatory Visit: Payer: Self-pay | Admitting: Neurological Surgery

## 2020-09-06 ENCOUNTER — Telehealth: Payer: Self-pay | Admitting: *Deleted

## 2020-09-06 DIAGNOSIS — M48062 Spinal stenosis, lumbar region with neurogenic claudication: Secondary | ICD-10-CM | POA: Diagnosis not present

## 2020-09-06 DIAGNOSIS — M8588 Other specified disorders of bone density and structure, other site: Secondary | ICD-10-CM | POA: Diagnosis not present

## 2020-09-06 DIAGNOSIS — M4316 Spondylolisthesis, lumbar region: Secondary | ICD-10-CM | POA: Diagnosis not present

## 2020-09-06 DIAGNOSIS — R030 Elevated blood-pressure reading, without diagnosis of hypertension: Secondary | ICD-10-CM | POA: Diagnosis not present

## 2020-09-06 DIAGNOSIS — M419 Scoliosis, unspecified: Secondary | ICD-10-CM | POA: Diagnosis not present

## 2020-09-06 DIAGNOSIS — Z6832 Body mass index (BMI) 32.0-32.9, adult: Secondary | ICD-10-CM | POA: Diagnosis not present

## 2020-09-06 NOTE — Telephone Encounter (Signed)
   [facility] Pre-operative Risk Assessment    Patient Name: Jon Jackson  DOB: [birth year]  MRN: [id number]   HEARTCARE STAFF: - Please ensure there is not already an duplicate clearance open for this procedure. - Under Visit Info/Reason for Call, type in Other and utilize the format Clearance MM/DD/YY or Clearance TBD. Do not use dashes or single digits. - If request is for dental extraction, please clarify the # of teeth to be extracted. - If the patient is currently at the dentist's office, call Pre-Op APP to address. If the patient is not currently in the dentist office, please route to the Pre-Op pool  Request for surgical clearance:  What type of surgery is being performed? RE-DO L3-4 LUMBAR LAMINECTOMY FUSION   When is this surgery scheduled? TBD   What type of clearance is required (medical clearance vs. Pharmacy clearance to hold med vs. Both)? BOTH  Are there any medications that need to be held prior to surgery and how long? [place] name and name of physician performing surgery? [facility]; DR. Sherley Bounds   What is the office phone number? [phone]   7.   What is the office fax number? [phone] ATTN: VANESSA x [phone]  8.   Anesthesia type (None, local, MAC, general) ? GENERAL   Julaine Hua 09/06/2020, 11:54 AM  _________________________________________________________________   (provider comments below)

## 2020-09-06 NOTE — Telephone Encounter (Signed)
Primary Cardiologist:None  Chart reviewed as part of pre-operative protocol coverage. Because of Jon Jackson's past medical history and time since last visit, he/she will require a follow-up visit in order to better assess preoperative cardiovascular risk.  Pre-op covering staff: - Please schedule appointment and call patient to inform them. - Please contact requesting surgeon's office via preferred method (i.e, phone, fax) to inform them of need for appointment prior to surgery.  If applicable, this message will also be routed to pharmacy pool and/or primary cardiologist for input on holding anticoagulant/antiplatelet agent as requested below so that this information is available at time of patient's appointment.   Deberah Pelton, NP  09/06/2020, 1:28 PM

## 2020-09-06 NOTE — Telephone Encounter (Signed)
Patient with diagnosis of afib on Eliquis for anticoagulation.    Procedure: RE-DO L3-4 LUMBAR LAMINECTOMY FUSION    Date of procedure: TBD   CHA2DS2-VASc Score = 2  This indicates a 2.2% annual risk of stroke. The patient's score is based upon: CHF History: No HTN History: Yes Diabetes History: No Stroke History: No Vascular Disease History: No Age Score: 1 Gender Score: 0    CrCl 89 ml/min Platelet count 234  Per office protocol, patient can hold Eliquis for 3 days prior to procedure.

## 2020-09-06 NOTE — Telephone Encounter (Signed)
Will send message to EP scheduler [place] to please reach out to the pt with an appt for pre op clearance.

## 2020-09-07 NOTE — Telephone Encounter (Signed)
Pt has appt 6/22 with Oda Kilts, [facility] for pre op clearance. Will forward notes to [facility] for apt. Will send FYI to requesting office pt has appt 09/19/20.

## 2020-09-10 NOTE — Progress Notes (Addendum)
Subjective:   Jon Jackson is a 70 y.o. male who presents for Medicare Annual/Subsequent preventive examination.  I connected with Jon Jackson today by telephone and verified that I am speaking with the correct person using two identifiers. Location patient: home Location provider: work Persons participating in the virtual visit: patient, provider.   I discussed the limitations, risks, security and privacy concerns of performing an evaluation and management service by telephone and the availability of in person appointments. I also discussed with the patient that there may be a patient responsible charge related to this service. The patient expressed understanding and verbally consented to this telephonic visit.    Interactive audio and video telecommunications were attempted between this provider and patient, however failed, due to patient having technical difficulties OR patient did not have access to video capability.  We continued and completed visit with audio only.     Review of Systems    N/A  Cardiac Risk Factors include: advanced age (>66men, >62 women);smoking/ tobacco exposure;male gender;dyslipidemia;hypertension     Objective:    Today's Vitals   09/11/20 0945  PainSc: 7    There is no height or weight on file to calculate BMI.  Advanced Directives 09/11/2020 12/29/2018 12/24/2018 11/29/2018 05/27/2017 07/22/2016 05/08/2016  Does Patient Have a Medical Advance Directive? No No No No No No No  Would patient like information on creating a medical advance directive? No - Patient declined No - Patient declined No - Patient declined - No - Patient declined No - Patient declined No - Patient declined    Current Medications (verified) Outpatient Encounter Medications as of 09/11/2020  Medication Sig   apixaban (ELIQUIS) 5 MG TABS tablet Take 1 tablet (5 mg total) by mouth 2 (two) times daily.   citalopram (CELEXA) 20 MG tablet Take 1/2 tab p.o. for 7 days, then increase  to 1 tab daily   ibuprofen (ADVIL,MOTRIN) 200 MG tablet Take 400-600 mg by mouth every 6 (six) hours as needed for headache or moderate pain.    levocetirizine (XYZAL) 5 MG tablet Take 5 mg by mouth daily.   Melatonin 10 MG TABS Take 10 mg by mouth at bedtime as needed (sleep).   Multiple Vitamin (MULTIVITAMIN) tablet Take 1 tablet by mouth daily.   Naphazoline-Pheniramine (ALLERGY EYE OP) Place 1-2 drops into both eyes 3 (three) times daily as needed (for seasonal allergy/itchy eyes).   omeprazole (PRILOSEC) 40 MG capsule Take 1 capsule by mouth once daily   simvastatin (ZOCOR) 20 MG tablet Take 0.5 tablets (10 mg total) by mouth daily.   acetaminophen (TYLENOL) 500 MG tablet Take 1 tablet (500 mg total) by mouth every 6 (six) hours as needed. (Patient not taking: Reported on 09/11/2020)   No facility-administered encounter medications on file as of 09/11/2020.    Allergies (verified) Tramadol   History: Past Medical History:  Diagnosis Date   Anxiety    Arthritis    Atrial fibrillation (HCC)    Basal cell carcinoma    Depression    GERD (gastroesophageal reflux disease)    Hyperlipidemia    Squamous carcinoma    Past Surgical History:  Procedure Laterality Date   ATRIAL FIBRILLATION ABLATION  07/22/2016   ATRIAL FIBRILLATION ABLATION N/A 07/22/2016   Procedure: Atrial Fibrillation Ablation;  Surgeon: Will Meredith Leeds, MD;  Location: [facility];  Service: Cardiovascular;  Laterality: N/A;   BASAL CELL CARCINOMA EXCISION     forehead; top of scalp   BIOPSY  05/27/2017  Procedure: BIOPSY;  Surgeon: [name], MD;  Location: [facility];  Service: Endoscopy;;  right colon, left colon;   CARDIOVERSION N/A 04/07/2016   Procedure: CARDIOVERSION;  Surgeon: Satira Sark, MD;  Location: [facility];  Service: Cardiovascular;  Laterality: N/A;   COLECTOMY  1990s   removal of section due to strangulated bowel.   COLONOSCOPY N/A 05/30/2013   Procedure: COLONOSCOPY;   Surgeon: [name], MD;  Location: [facility];  Service: Endoscopy;  Laterality: N/A;  8:45 AM   COLONOSCOPY     COLONOSCOPY N/A 05/27/2017   Procedure: COLONOSCOPY;  Surgeon: [name], MD;  Location: [facility];  Service: Endoscopy;  Laterality: N/A;  1:00pm   INGUINAL HERNIA REPAIR Right 1990s   LUMBAR LAMINECTOMY/DECOMPRESSION MICRODISCECTOMY N/A 12/29/2018   Procedure: Decompressive lumbar laminectomy at L3-L4;  Surgeon: Latanya Maudlin, MD;  Location: [facility];  Service: Orthopedics;  Laterality: N/A;  115min   SHOULDER ARTHROSCOPY WITH ROTATOR CUFF REPAIR AND SUBACROMIAL DECOMPRESSION Left 05/15/2016   Procedure: LEFT SHOULDER ARTHROSCOPY WITH ROTATOR CUFF REPAIR AND SUBACROMIAL DECOMPRESSION AND DISTAL CLAVICLE RESECTION;  Surgeon: [name], MD;  Location: [place];  Service: Orthopedics;  Laterality: Left;  requests 2hrs   SHOULDER SURGERY Right    SQUAMOUS CELL CARCINOMA EXCISION     scalp   TONSILLECTOMY     Family History  Problem Relation Age of Onset   Colon cancer Brother 28   Throat cancer Brother    Breast cancer Mother    Social History   Socioeconomic History   Marital status: Married    Spouse name: Not on file   Number of children: Not on file   Years of education: Not on file   Highest education level: Not on file  Occupational History   Not on file  Tobacco Use   Smoking status: Some Days    Packs/day: 1.00    Years: 47.00    Pack years: 47.00    Types: Cigars, Cigarettes   Smokeless tobacco: Never  Vaping Use   Vaping Use: Never used  Substance and Sexual Activity   Alcohol use: Yes    Alcohol/week: 12.0 standard drinks    Types: 12 Cans of beer per week    Comment: daily 3-4 in the summer; soemtimes more on weekends.   Drug use: No   Sexual activity: Yes  Other Topics Concern   Not on file  Social History Narrative   Not on file   Social Determinants of Health   Financial Resource Strain: Low Risk    Difficulty of Paying  Living Expenses: Not hard at all  Food Insecurity: No Food Insecurity   Worried About Charity fundraiser in the Last Year: Never true   [place] in the Last Year: Never true  Transportation Needs: No Transportation Needs   Lack of Transportation (Medical): No   Lack of Transportation (Non-Medical): No  Physical Activity: Sufficiently Active   Days of Exercise per Week: 7 days   Minutes of Exercise per Session: 30 min  Stress: No Stress Concern Present   Feeling of Stress : Not at all  Social Connections: Moderately Isolated   Frequency of Communication with Friends and Family: More than three times a week   Frequency of Social Gatherings with Friends and Family: More than three times a week   Attends Religious Services: Never   Marine scientist or Organizations: No   Attends Archivist Meetings:  Never   Marital Status: Married    Tobacco Counseling Ready to quit: Not Answered Counseling given: Not Answered   Clinical Intake:  Pre-visit preparation completed: Yes  Pain : 0-10 Pain Score: 7  Pain Type: Chronic pain Pain Location: Back Pain Orientation: Lower Pain Onset: More than a month ago Pain Frequency: Constant Pain Relieving Factors: Nerve block  Pain Relieving Factors: Nerve block  Nutritional Risks: None Diabetes: No  How often do you need to have someone help you when you read instructions, pamphlets, or other written materials from your doctor or pharmacy?: 1 - Never  Diabetic?No  Interpreter Needed?: No  Information entered by :: [place] of Daily Living In your present state of health, do you have any difficulty performing the following activities: 09/11/2020  Hearing? N  Vision? Y  Difficulty concentrating or making decisions? N  Walking or climbing stairs? N  Dressing or bathing? N  Doing errands, shopping? N  Preparing Food and eating ? N  Using the Toilet? N  In the past six months, have you accidently  leaked urine? Y  Do you have problems with loss of bowel control? Y  Managing your Medications? N  Managing your Finances? N  Housekeeping or managing your Housekeeping? N  Some recent data might be hidden    Patient Care Team: Erven Colla, DO as PCP - General (Family Medicine) Constance Haw, MD as PCP - Electrophysiology (Cardiology)  Indicate any recent Medical Services you may have received from other than [facility] providers in the past year (date may be approximate).     Assessment:   This is a routine wellness examination for [place].  Hearing/Vision screen Vision Screening - Comments:: Patient states he gets his eyes examined once every two years. Currently wears glasses   Dietary issues and exercise activities discussed: Current Exercise Habits: Home exercise routine, Type of exercise: walking, Time (Minutes): 30, Frequency (Times/Week): 7, Weekly Exercise (Minutes/Week): 210, Exercise limited by: orthopedic condition(s)   Goals Addressed             This Visit's Progress    Patient Stated       I would like to have my back surgery so that my back could feel better         Depression Screen PHQ 2/9 Scores 09/11/2020 12/27/2019 05/24/2018 11/18/2017 11/10/2016  PHQ - 2 Score 0 0 0 0 2  PHQ- 9 Score - 1 3 1 4     Fall Risk Fall Risk  09/11/2020 12/27/2019 05/24/2018 11/10/2016  Falls in the past year? 1 0 0 No  Number falls in past yr: 0 - - -  Comment slipped on ice - - -  Injury with Fall? 1 - - -  Comment Broke a rib when he fell and slipped on ice - - -  Risk for fall due to : No Fall Risks - - -  Follow up Falls evaluation completed;Falls prevention discussed Falls evaluation completed - -    FALL RISK PREVENTION PERTAINING TO THE HOME:2  Any stairs in or around the home? Yes  If so, are there any without handrails? No  Home free of loose throw rugs in walkways, pet beds, electrical cords, etc? Yes  Adequate lighting in your home to reduce risk of  falls? Yes   ASSISTIVE DEVICES UTILIZED TO PREVENT FALLS:  Life alert? No  Use of a cane, walker or w/c? No  Grab bars in the bathroom? No  Shower chair  or bench in shower? Yes  Elevated toilet seat or a handicapped toilet? No    Cognitive Function:  Normal cognitive status assessed by direct observation by this Nurse Health Advisor. No abnormalities found.        Immunizations Immunization History  Administered Date(s) Administered   Fluad Quad(high Dose 65+) 12/27/2019   Influenza Split 02/02/2013   Influenza, High Dose Seasonal PF 03/08/2018   Influenza,inj,Quad PF,6+ Mos 02/12/2016   Influenza-Unspecified 02/20/2015, 02/23/2017, 03/08/2018, 03/04/2019   Moderna Sars-Covid-2 Vaccination 05/30/2019, 06/28/2019, 05/07/2020   Pneumococcal Conjugate-13 05/14/2017   Pneumococcal Polysaccharide-23 02/12/2016, 03/04/2019    TDAP status: Due, Education has been provided regarding the importance of this vaccine. Advised may receive this vaccine at local pharmacy or Health Dept. Aware to provide a copy of the vaccination record if obtained from local pharmacy or Health Dept. Verbalized acceptance and understanding.  Flu Vaccine status: Up to date  Pneumococcal vaccine status: Up to date  Covid-19 vaccine status: Completed vaccines  Qualifies for Shingles Vaccine? Yes   Zostavax completed No   Shingrix Completed?: No.    Education has been provided regarding the importance of this vaccine. Patient has been advised to call insurance company to determine out of pocket expense if they have not yet received this vaccine. Advised may also receive vaccine at local pharmacy or Health Dept. Verbalized acceptance and understanding.  Screening Tests Health Maintenance  Topic Date Due   Hepatitis C Screening  Never done   TETANUS/TDAP  Never done   Zoster Vaccines- Shingrix (1 of 2) Never done   COVID-19 Vaccine (4 - Booster for Moderna series) 08/04/2020   INFLUENZA VACCINE  10/29/2020    COLONOSCOPY (Pts 45-52yrs Insurance coverage will need to be confirmed)  05/28/2027   PNA vac Low Risk Adult  Completed   HPV VACCINES  Aged Out    Health Maintenance  Health Maintenance Due  Topic Date Due   Hepatitis C Screening  Never done   TETANUS/TDAP  Never done   Zoster Vaccines- Shingrix (1 of 2) Never done   COVID-19 Vaccine (4 - Booster for Moderna series) 08/04/2020    Colorectal cancer screening: Type of screening: Colonoscopy. Completed 05/27/2017. Repeat every 10 years  Lung Cancer Screening: (Low Dose CT Chest recommended if Age 87-80 years, 30 pack-year currently smoking OR have quit w/in 15years.) does qualify.   Lung Cancer Screening Referral: Yes  Additional Screening:  Hepatitis C Screening: does qualify; Completed   Vision Screening: Recommended annual ophthalmology exams for early detection of glaucoma and other disorders of the eye. Is the patient up to date with their annual eye exam?  Yes  Who is the provider or what is the name of the office in which the patient attends annual eye exams? [facility]  If pt is not established with a provider, would they like to be referred to a provider to establish care? No .   Dental Screening: Recommended annual dental exams for proper oral hygiene  Community Resource Referral / Chronic Care Management: CRR required this visit?  No   CCM required this visit?  No      Plan:     I have personally reviewed and noted the following in the patient's chart:   Medical and social history Use of alcohol, tobacco or illicit drugs  Current medications and supplements including opioid prescriptions. Patient is not currently taking opioid prescriptions. Functional ability and status Nutritional status Physical activity Advanced directives List of other physicians Hospitalizations,  surgeries, and ER visits in previous 12 months Vitals Screenings to include cognitive, depression, and  falls Referrals and appointments  In addition, I have reviewed and discussed with patient certain preventive protocols, quality metrics, and best practice recommendations. A written personalized care plan for preventive services as well as general preventive health recommendations were provided to patient.     Ofilia Neas, LPN   5/36/1443   Nurse Notes: None  I reviewed over this note and agree with its findings Sallee Lange MD [facility]

## 2020-09-11 ENCOUNTER — Ambulatory Visit (INDEPENDENT_AMBULATORY_CARE_PROVIDER_SITE_OTHER): Payer: Medicare Other

## 2020-09-11 DIAGNOSIS — F17200 Nicotine dependence, unspecified, uncomplicated: Secondary | ICD-10-CM

## 2020-09-11 DIAGNOSIS — Z0000 Encounter for general adult medical examination without abnormal findings: Secondary | ICD-10-CM

## 2020-09-11 NOTE — Patient Instructions (Signed)
[name] , Thank you for taking time to come for your Medicare Wellness Visit. I appreciate your ongoing commitment to your health goals. Please review the following plan we discussed and let me know if I can assist you in the future.   Screening recommendations/referrals: Colonoscopy: Up to date, next due 05/28/2027 Recommended yearly ophthalmology/optometry visit for glaucoma screening and checkup Recommended yearly dental visit for hygiene and checkup  Vaccinations: Influenza vaccine: Up to date, next due fall 2022  Pneumococcal vaccine: Completed series  Tdap vaccine: Currently due, you may await and injury to receive  Shingles vaccine: Currently due, if you would like to receive we recommend that you do so at your local pharmacy    Advanced directives: Advance directive discussed with you today. Even though you declined this today please call our office should you change your mind and we can give you the proper paperwork for you to fill out.   Conditions/risks identified: None   Next appointment: None   Preventive Care 65 Years and Older, Male Preventive care refers to lifestyle choices and visits with your health care provider that can promote health and wellness. What does preventive care include? A yearly physical exam. This is also called an annual well check. Dental exams once or twice a year. Routine eye exams. Ask your health care provider how often you should have your eyes checked. Personal lifestyle choices, including: Daily care of your teeth and gums. Regular physical activity. Eating a healthy diet. Avoiding tobacco and drug use. Limiting alcohol use. Practicing safe sex. Taking low doses of aspirin every day. Taking vitamin and mineral supplements as recommended by your health care provider. What happens during an annual well check? The services and screenings done by your health care provider during your annual well check will depend on your age, overall  health, lifestyle risk factors, and family history of disease. Counseling  Your health care provider may ask you questions about your: Alcohol use. Tobacco use. Drug use. Emotional well-being. Home and relationship well-being. Sexual activity. Eating habits. History of falls. Memory and ability to understand (cognition). Work and work Statistician. Screening  You may have the following tests or measurements: Height, weight, and BMI. Blood pressure. Lipid and cholesterol levels. These may be checked every 5 years, or more frequently if you are over 65 years old. Skin check. Lung cancer screening. You may have this screening every year starting at age 39 if you have a 30-pack-year history of smoking and currently smoke or have quit within the past 15 years. Fecal occult blood test (FOBT) of the stool. You may have this test every year starting at age 75. Flexible sigmoidoscopy or colonoscopy. You may have a sigmoidoscopy every 5 years or a colonoscopy every 10 years starting at age 32. Prostate cancer screening. Recommendations will vary depending on your family history and other risks. Hepatitis C blood test. Hepatitis B blood test. Sexually transmitted disease (STD) testing. Diabetes screening. This is done by checking your blood sugar (glucose) after you have not eaten for a while (fasting). You may have this done every 1-3 years. Abdominal aortic aneurysm (AAA) screening. You may need this if you are a current or former smoker. Osteoporosis. You may be screened starting at age 74 if you are at high risk. Talk with your health care provider about your test results, treatment options, and if necessary, the need for more tests. Vaccines  Your health care provider may recommend certain vaccines, such as: Influenza vaccine. This is  recommended every year. Tetanus, diphtheria, and acellular pertussis (Tdap, Td) vaccine. You may need a Td booster every 10 years. Zoster vaccine. You may  need this after age 74. Pneumococcal 13-valent conjugate (PCV13) vaccine. One dose is recommended after age 16. Pneumococcal polysaccharide (PPSV23) vaccine. One dose is recommended after age 50. Talk to your health care provider about which screenings and vaccines you need and how often you need them. This information is not intended to replace advice given to you by your health care provider. Make sure you discuss any questions you have with your health care provider. Document Released: 04/13/2015 Document Revised: 12/05/2015 Document Reviewed: 01/16/2015 Elsevier Interactive Patient Education  2017 [employer] Prevention in the Home Falls can cause injuries. They can happen to people of all ages. There are many things you can do to make your home safe and to help prevent falls. What can I do on the outside of my home? Regularly fix the edges of walkways and driveways and fix any cracks. Remove anything that might make you trip as you walk through a door, such as a raised step or threshold. Trim any bushes or trees on the path to your home. Use bright outdoor lighting. Clear any walking paths of anything that might make someone trip, such as rocks or tools. Regularly check to see if handrails are loose or broken. Make sure that both sides of any steps have handrails. Any raised decks and porches should have guardrails on the edges. Have any leaves, snow, or ice cleared regularly. Use sand or salt on walking paths during winter. Clean up any spills in your garage right away. This includes oil or grease spills. What can I do in the bathroom? Use night lights. Install grab bars by the toilet and in the tub and shower. Do not use towel bars as grab bars. Use non-skid mats or decals in the tub or shower. If you need to sit down in the shower, use a plastic, non-slip stool. Keep the floor dry. Clean up any water that spills on the floor as soon as it happens. Remove soap buildup in  the tub or shower regularly. Attach bath mats securely with double-sided non-slip rug tape. Do not have throw rugs and other things on the floor that can make you trip. What can I do in the bedroom? Use night lights. Make sure that you have a light by your bed that is easy to reach. Do not use any sheets or blankets that are too big for your bed. They should not hang down onto the floor. Have a firm chair that has side arms. You can use this for support while you get dressed. Do not have throw rugs and other things on the floor that can make you trip. What can I do in the kitchen? Clean up any spills right away. Avoid walking on wet floors. Keep items that you use a lot in easy-to-reach places. If you need to reach something above you, use a strong step stool that has a grab bar. Keep electrical cords out of the way. Do not use floor polish or wax that makes floors slippery. If you must use wax, use non-skid floor wax. Do not have throw rugs and other things on the floor that can make you trip. What can I do with my stairs? Do not leave any items on the stairs. Make sure that there are handrails on both sides of the stairs and use them. Fix handrails that are  broken or loose. Make sure that handrails are as long as the stairways. Check any carpeting to make sure that it is firmly attached to the stairs. Fix any carpet that is loose or worn. Avoid having throw rugs at the top or bottom of the stairs. If you do have throw rugs, attach them to the floor with carpet tape. Make sure that you have a light switch at the top of the stairs and the bottom of the stairs. If you do not have them, ask someone to add them for you. What else can I do to help prevent falls? Wear shoes that: Do not have high heels. Have rubber bottoms. Are comfortable and fit you well. Are closed at the toe. Do not wear sandals. If you use a stepladder: Make sure that it is fully opened. Do not climb a closed  stepladder. Make sure that both sides of the stepladder are locked into place. Ask someone to hold it for you, if possible. Clearly mark and make sure that you can see: Any grab bars or handrails. First and last steps. Where the edge of each step is. Use tools that help you move around (mobility aids) if they are needed. These include: Canes. Walkers. Scooters. Crutches. Turn on the lights when you go into a dark area. Replace any light bulbs as soon as they burn out. Set up your furniture so you have a clear path. Avoid moving your furniture around. If any of your floors are uneven, fix them. If there are any pets around you, be aware of where they are. Review your medicines with your doctor. Some medicines can make you feel dizzy. This can increase your chance of falling. Ask your doctor what other things that you can do to help prevent falls. This information is not intended to replace advice given to you by your health care provider. Make sure you discuss any questions you have with your health care provider. Document Released: 01/11/2009 Document Revised: 08/23/2015 Document Reviewed: 04/21/2014 Elsevier Interactive Patient Education  2017 [employer].

## 2020-09-19 ENCOUNTER — Ambulatory Visit: Payer: BC Managed Care – PPO | Admitting: Student

## 2020-10-03 NOTE — Progress Notes (Signed)
PCP:  Erven Colla, DO Primary Cardiologist: None Electrophysiologist: Will Meredith Leeds, MD   Jon Jackson is a 70 y.o. male seen today for Will Meredith Leeds, MD for cardiac clearance for re-do L3-4 Lumbar laminectomy fusion  Since last being seen in our clinic the patient reports doing very well. He denies chest pain, palpitations, dyspnea, PND, orthopnea, nausea, vomiting, dizziness, syncope, weight gain, or early satiety. He walks for exercise. Has occasional mild peripheral edema.  Past Medical History:  Diagnosis Date   Anxiety    Arthritis    Atrial fibrillation ([place])    Basal cell carcinoma    Depression    GERD (gastroesophageal reflux disease)    Hyperlipidemia    Squamous carcinoma    Past Surgical History:  Procedure Laterality Date   ATRIAL FIBRILLATION ABLATION  07/22/2016   ATRIAL FIBRILLATION ABLATION N/A 07/22/2016   Procedure: Atrial Fibrillation Ablation;  Surgeon: Will Meredith Leeds, MD;  Location: [facility];  Service: Cardiovascular;  Laterality: N/A;   BASAL CELL CARCINOMA EXCISION     forehead; top of scalp   BIOPSY  05/27/2017   Procedure: BIOPSY;  Surgeon: [name], MD;  [facility];  Service: Endoscopy;;  right colon, left colon;   CARDIOVERSION N/A 04/07/2016   Procedure: CARDIOVERSION;  Surgeon: Satira Sark, MD;  Location: [facility];  Service: Cardiovascular;  Laterality: N/A;   COLECTOMY  1990s   removal of section due to strangulated bowel.   COLONOSCOPY N/A 05/30/2013   Procedure: COLONOSCOPY;  Surgeon: [name], MD;  [facility];  Service: Endoscopy;  Laterality: N/A;  8:45 AM   COLONOSCOPY     COLONOSCOPY N/A 05/27/2017   Procedure: COLONOSCOPY;  Surgeon: [name], MD;  [facility];  Service: Endoscopy;  Laterality: N/A;  1:00pm   INGUINAL HERNIA REPAIR Right 1990s   LUMBAR LAMINECTOMY/DECOMPRESSION MICRODISCECTOMY N/A 12/29/2018   Procedure: Decompressive lumbar  laminectomy at L3-L4;  Surgeon: Latanya Maudlin, MD;  Location: [facility];  Service: Orthopedics;  Laterality: N/A;  123min   SHOULDER ARTHROSCOPY WITH ROTATOR CUFF REPAIR AND SUBACROMIAL DECOMPRESSION Left 05/15/2016   Procedure: LEFT SHOULDER ARTHROSCOPY WITH ROTATOR CUFF REPAIR AND SUBACROMIAL DECOMPRESSION AND DISTAL CLAVICLE RESECTION;  Surgeon: [name], MD;  Location: [facility];  Service: Orthopedics;  Laterality: Left;  requests 2hrs   SHOULDER SURGERY Right    SQUAMOUS CELL CARCINOMA EXCISION     scalp   TONSILLECTOMY      Current Outpatient Medications  Medication Sig Dispense Refill   acetaminophen (TYLENOL) 500 MG tablet Take 1 tablet (500 mg total) by mouth every 6 (six) hours as needed. (Patient not taking: Reported on 09/11/2020) 30 tablet 0   apixaban (ELIQUIS) 5 MG TABS tablet Take 1 tablet (5 mg total) by mouth 2 (two) times daily. 180 tablet 3   citalopram (CELEXA) 20 MG tablet Take 1/2 tab p.o. for 7 days, then increase to 1 tab daily 30 tablet 2   ibuprofen (ADVIL,MOTRIN) 200 MG tablet Take 400-600 mg by mouth every 6 (six) hours as needed for headache or moderate pain.      levocetirizine (XYZAL) 5 MG tablet Take 5 mg by mouth daily.     Melatonin 10 MG TABS Take 10 mg by mouth at bedtime as needed (sleep).     Multiple Vitamin (MULTIVITAMIN) tablet Take 1 tablet by mouth daily.     Naphazoline-Pheniramine (ALLERGY EYE OP) Place 1-2 drops into both eyes  3 (three) times daily as needed (for seasonal allergy/itchy eyes).     omeprazole (PRILOSEC) 40 MG capsule Take 1 capsule by mouth once daily 90 capsule 1   simvastatin (ZOCOR) 20 MG tablet Take 0.5 tablets (10 mg total) by mouth daily. 90 tablet 1   No current facility-administered medications for this visit.    Allergies  Allergen Reactions   Tramadol Other (See Comments)    Dizziness/headache    Social History   Socioeconomic History   Marital status: Married    Spouse name: Not on file   Number of children: Not  on file   Years of education: Not on file   Highest education level: Not on file  Occupational History   Not on file  Tobacco Use   Smoking status: Some Days    Packs/day: 1.00    Years: 47.00    Pack years: 47.00    Types: Cigars, Cigarettes   Smokeless tobacco: Never  Vaping Use   Vaping Use: Never used  Substance and Sexual Activity   Alcohol use: Yes    Alcohol/week: 12.0 standard drinks    Types: 12 Cans of beer per week    Comment: daily 3-4 in the summer; soemtimes more on weekends.   Drug use: No   Sexual activity: Yes  Other Topics Concern   Not on file  Social History Narrative   Not on file   Social Determinants of Health   Financial Resource Strain: Low Risk    Difficulty of Paying Living Expenses: Not hard at all  Food Insecurity: No Food Insecurity   Worried About Charity fundraiser in the Last Year: Never true   [place] in the Last Year: Never true  Transportation Needs: No Transportation Needs   Lack of Transportation (Medical): No   Lack of Transportation (Non-Medical): No  Physical Activity: Sufficiently Active   Days of Exercise per Week: 7 days   Minutes of Exercise per Session: 30 min  Stress: No Stress Concern Present   Feeling of Stress : Not at all  Social Connections: Moderately Isolated   Frequency of Communication with Friends and Family: More than three times a week   Frequency of Social Gatherings with Friends and Family: More than three times a week   Attends Religious Services: Never   Marine scientist or Organizations: No   Attends Music therapist: Never   Marital Status: Married  Human resources officer Violence: Not At Risk   Fear of Current or Ex-Partner: No   Emotionally Abused: No   Physically Abused: No   Sexually Abused: No     Review of Systems: All other systems reviewed and are otherwise negative except as noted above.  Physical Exam: There were no vitals filed for this visit.  GEN- The  patient is well appearing, alert and oriented x 3 today.   HEENT: normocephalic, atraumatic; sclera clear, conjunctiva pink; hearing intact; oropharynx clear; neck supple, no JVP Lymph- no cervical lymphadenopathy Lungs- Clear to ausculation bilaterally, normal work of breathing.  No wheezes, rales, rhonchi Heart- Regular rate and rhythm, no murmurs, rubs or gallops, PMI not laterally displaced GI- soft, non-tender, non-distended, bowel sounds present, no hepatosplenomegaly Extremities- no clubbing, cyanosis, or edema; DP/PT/radial pulses 2+ bilaterally MS- no significant deformity or atrophy Skin- warm and dry, no rash or lesion Psych- euthymic mood, full affect Neuro- strength and sensation are intact  EKG is ordered. Personal review of EKG from today shows NSR  at 77 bpm  Additional studies reviewed include: Previous EP office notes and echo as below  Assessment and Plan:  1. Paroxysmal atrial fibrillation s/p ablation 06/2016 Continue eliquis for CHA2DS2VASC of at least 2 . Denies bleeding.  EKG today shows NSR. He feels like it has been "years" since he has felt AF.  Labs today on Eliquis.   2. HTN Slightly elevated today. Not on anti-hypertensives. Follow.    3. HLD Continue statin per PCP   4. 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 Echo 06/2019 with normal EF and mild AS.  Stable.   5. Cardiac clearance for re-do L3-4 Lumbar laminectomy fusion Echo last year with normal EF.  No s/s CHF or ischemia He is able to proceed with a relatively low risk of perioperative complications from a cardiac perspective by the Revised Cardiac Risk Index [place] Criteria).  He verbalizes understanding of risk of stroke holding [place] and risk of breakthrough AF with a stressor such as surgery.    Shirley Friar, PA-C  10/03/20 1:54 PM

## 2020-10-04 ENCOUNTER — Ambulatory Visit (INDEPENDENT_AMBULATORY_CARE_PROVIDER_SITE_OTHER): Payer: Medicare Other | Admitting: Student

## 2020-10-04 ENCOUNTER — Encounter: Payer: Self-pay | Admitting: Student

## 2020-10-04 ENCOUNTER — Other Ambulatory Visit: Payer: Self-pay

## 2020-10-04 VITALS — BP 142/74 | HR 77 | Ht 70.5 in | Wt 220.0 lb

## 2020-10-04 DIAGNOSIS — E785 Hyperlipidemia, unspecified: Secondary | ICD-10-CM | POA: Diagnosis not present

## 2020-10-04 DIAGNOSIS — I350 Nonrheumatic aortic (valve) stenosis: Secondary | ICD-10-CM | POA: Diagnosis not present

## 2020-10-04 DIAGNOSIS — I10 Essential (primary) hypertension: Secondary | ICD-10-CM | POA: Diagnosis not present

## 2020-10-04 DIAGNOSIS — I4819 Other persistent atrial fibrillation: Secondary | ICD-10-CM | POA: Diagnosis not present

## 2020-10-04 LAB — BASIC METABOLIC PANEL
BUN/Creatinine Ratio: 17 (ref 10–24)
BUN: 15 mg/dL (ref 8–27)
CO2: 23 mmol/L (ref 20–29)
Calcium: 9.4 mg/dL (ref 8.6–10.2)
Chloride: 103 mmol/L (ref 96–106)
Creatinine, Ser: 0.89 mg/dL (ref 0.76–1.27)
Glucose: 89 mg/dL (ref 65–99)
Potassium: 4.8 mmol/L (ref 3.5–5.2)
Sodium: 139 mmol/L (ref 134–144)
eGFR: 92 mL/min/{1.73_m2} (ref >59)

## 2020-10-04 LAB — CBC
Hematocrit: 44.1 % (ref 37.5–51.0)
Hemoglobin: 14.9 g/dL (ref 13.0–17.7)
MCH: 31.6 pg (ref 26.6–33.0)
MCHC: 33.8 g/dL (ref 31.5–35.7)
MCV: 93 fL (ref 79–97)
Platelets: 252 10*3/uL (ref 150–450)
RBC: 4.72 x10E6/uL (ref 4.14–5.80)
RDW: 11.8 % (ref 11.6–15.4)
WBC: 6.1 10*3/uL (ref 3.4–10.8)

## 2020-10-04 NOTE — Patient Instructions (Signed)
Medication Instructions:  Your physician recommends that you continue on your current medications as directed. Please refer to the Current Medication list given to you today.  *If you need a refill on your cardiac medications before your next appointment, please call your pharmacy*   Lab Work: TODAY: BMET, CBC  If you have labs (blood work) drawn today and your tests are completely normal, you will receive your results only by: [place] (if you have MyChart) OR A paper copy in the mail If you have any lab test that is abnormal or we need to change your treatment, we will call you to review the results.  Follow-Up: At [facility], you and your health needs are our priority.  As part of our continuing mission to provide you with exceptional heart care, we have created designated Provider Care Teams.  These Care Teams include your primary Cardiologist (physician) and Advanced Practice Providers (APPs -  Physician Assistants and Nurse Practitioners) who all work together to provide you with the care you need, when you need it.   Your next appointment:   1 year(s)  The format for your next appointment:   In Person  Provider:   You may see Will Meredith Leeds, MD or one of the following Advanced Practice Providers on your designated Care Team:    Tommye Standard, [place] Legrand Como "[facility]" [place], [place]

## 2020-10-08 ENCOUNTER — Encounter (HOSPITAL_COMMUNITY): Payer: Self-pay

## 2020-10-08 NOTE — Progress Notes (Signed)
I have attempted to reach patient regarding lung cancer screening, unable to reach patient at this time. Detailed VM left asking that the patient return my call.

## 2020-10-16 ENCOUNTER — Other Ambulatory Visit: Payer: Self-pay

## 2020-10-16 ENCOUNTER — Ambulatory Visit
Admission: RE | Admit: 2020-10-16 | Discharge: 2020-10-16 | Disposition: A | Discharge status: Home / Self Care | Payer: Medicare Other | Source: Ambulatory Visit | Attending: Neurological Surgery | Admitting: Neurological Surgery

## 2020-10-16 DIAGNOSIS — M8589 Other specified disorders of bone density and structure, multiple sites: Secondary | ICD-10-CM | POA: Diagnosis not present

## 2020-10-16 DIAGNOSIS — M8588 Other specified disorders of bone density and structure, other site: Secondary | ICD-10-CM

## 2020-10-25 ENCOUNTER — Other Ambulatory Visit: Payer: Self-pay | Admitting: Neurological Surgery

## 2020-11-07 ENCOUNTER — Telehealth: Payer: Self-pay | Admitting: Family Medicine

## 2020-11-07 ENCOUNTER — Encounter: Payer: Self-pay | Admitting: Family Medicine

## 2020-11-07 DIAGNOSIS — M8580 Other specified disorders of bone density and structure, unspecified site: Secondary | ICD-10-CM | POA: Insufficient documentation

## 2020-11-07 NOTE — Telephone Encounter (Signed)
Lovena Le, Malena M, DO        Dexa scan showing osteopenia.  Recommendations-Take vit d and calcium.   1200 mg of elemental calcium daily, and 800 international units of vitamin D daily are advised.   You can get this otc.   F/u in 2 yrs for repeat dexa scan.

## 2020-11-07 NOTE — Telephone Encounter (Signed)
Results discussed with patient. Patient advised per Dr Lovena Le: Dexa scan showing osteopenia.  Recommendations-Take vit d and calcium.   1200 mg of elemental calcium daily, and 800 international units of vitamin D daily are advised.   You can get this otc.   F/u in 2 yrs for repeat dexa scan  Patient verbalized understanding.

## 2020-11-07 NOTE — Progress Notes (Signed)
Surgical Instructions    Your procedure is scheduled on Monday August 22nd.  Report to [facility]" at 1130 A.M., then check in with the Admitting office.  Call this number if you have problems the morning of surgery:  [phone]   If you have any questions prior to your surgery date call [phone]: Open Monday-Friday 8am-4pm    Remember:  Do not eat or drink anything after midnight the night before your surgery   Take these medicines the morning of surgery with A SIP OF WATER  citalopram (CELEXA) 20 MG tablet omeprazole (PRILOSEC) 40 MG capsule simvastatin (ZOCOR) 20 MG tablet levocetirizine (XYZAL) 5 MG tablet  If needed acetaminophen (TYLENOL) 500 MG tablet fluticasone (FLONASE) 50 MCG/ACT nasal spray Naphazoline-Pheniramine (ALLERGY EYE OP)  Follow your surgeon's instructions on when to stop Eliquis.  If no instructions were given by your surgeon then you will need to call the office to get those instructions.    As of today, STOP taking any Aspirin (unless otherwise instructed by your surgeon) Aleve, Naproxen, Ibuprofen, Motrin, Advil, Goody's, BC's, all herbal medications, fish oil, and all vitamins.          Do not wear jewelry  Do not wear lotions, powders, colognes, or deodorant. Do not shave 48 hours prior to surgery.  Men may shave face and neck. Do not bring valuables to the hospital. DO Not wear nail polish, gel polish, artificial nails, or any other type of covering on  natural nails including finger and toenails. If patients have artificial nails, gel coating, etc. that need to be removed by a nail salon please have this removed prior to surgery or surgery may need to be canceled/delayed if the surgeon/ anesthesia feels like the patient is unable to be adequately monitored.             [facility] is not responsible for any belongings or valuables.  Do NOT Smoke (Tobacco/Vaping) or drink Alcohol 24 hours prior to your procedure If you use a  CPAP at night, you may bring all equipment for your overnight stay.   Contacts, glasses, dentures or bridgework may not be worn into surgery, please bring cases for these belongings   For patients admitted to the hospital, discharge time will be determined by your treatment team.   Patients discharged the day of surgery will not be allowed to drive home, and someone needs to stay with them for 24 hours.  ONLY 1 SUPPORT PERSON MAY BE PRESENT WHILE YOU ARE IN SURGERY. IF YOU ARE TO BE ADMITTED ONCE YOU ARE IN YOUR ROOM YOU WILL BE ALLOWED TWO (2) VISITORS.  Minor children may have two parents present. Special consideration for safety and communication needs will be reviewed on a case by case basis.  Special instructions:    Oral Hygiene is also important to reduce your risk of infection.  Remember - BRUSH YOUR TEETH THE MORNING OF SURGERY WITH YOUR REGULAR TOOTHPASTE   [place]- Preparing For Surgery  Before surgery, you can play an important role. Because skin is not sterile, your skin needs to be as free of germs as possible. You can reduce the number of germs on your skin by washing with CHG (chlorahexidine gluconate) Soap before surgery.  CHG is an antiseptic cleaner which kills germs and bonds with the skin to continue killing germs even after washing.     Please do not use if you have an allergy to CHG or antibacterial soaps. If your  skin becomes reddened/irritated stop using the CHG.  Do not shave (including legs and underarms) for at least 48 hours prior to first CHG shower. It is OK to shave your face.  Please follow these instructions carefully.     Shower the NIGHT BEFORE SURGERY and the MORNING OF SURGERY with CHG Soap.   If you chose to wash your hair, wash your hair first as usual with your normal shampoo. After you shampoo, rinse your hair and body thoroughly to remove the shampoo.  Then [employer] and genitals (private parts) with your normal soap and rinse thoroughly to  remove soap.  After that Use CHG Soap as you would any other liquid soap. You can apply CHG directly to the skin and wash gently with a scrungie or a clean washcloth.   Apply the CHG Soap to your body ONLY FROM THE NECK DOWN.  Do not use on open wounds or open sores. Avoid contact with your eyes, ears, mouth and genitals (private parts). Wash Face and genitals (private parts)  with your normal soap.   Wash thoroughly, paying special attention to the area where your surgery will be performed.  Thoroughly rinse your body with warm water from the neck down.  DO NOT shower/wash with your normal soap after using and rinsing off the CHG Soap.  Pat yourself dry with a CLEAN TOWEL.  Wear CLEAN PAJAMAS to bed the night before surgery  Place CLEAN SHEETS on your bed the night before your surgery  DO NOT SLEEP WITH PETS.   Day of Surgery:  Take a shower with CHG soap. Wear Clean/Comfortable clothing the morning of surgery Do not apply any deodorants/lotions.   Remember to brush your teeth WITH YOUR REGULAR TOOTHPASTE.   Please read over the following fact sheets that you were given.

## 2020-11-08 ENCOUNTER — Other Ambulatory Visit: Payer: Self-pay

## 2020-11-08 ENCOUNTER — Encounter (HOSPITAL_COMMUNITY): Payer: Self-pay

## 2020-11-08 ENCOUNTER — Encounter (HOSPITAL_COMMUNITY)
Admission: RE | Admit: 2020-11-08 | Discharge: 2020-11-08 | Disposition: A | Discharge status: Home / Self Care | Payer: Medicare Other | Source: Ambulatory Visit | Attending: Neurological Surgery | Admitting: Neurological Surgery

## 2020-11-08 DIAGNOSIS — M4316 Spondylolisthesis, lumbar region: Secondary | ICD-10-CM | POA: Insufficient documentation

## 2020-11-08 DIAGNOSIS — Z01812 Encounter for preprocedural laboratory examination: Secondary | ICD-10-CM | POA: Insufficient documentation

## 2020-11-08 LAB — BASIC METABOLIC PANEL
Anion gap: 7 (ref 5–15)
BUN: 13 mg/dL (ref 8–23)
CO2: 24 mmol/L (ref 22–32)
Calcium: 9 mg/dL (ref 8.9–10.3)
Chloride: 106 mmol/L (ref 98–111)
Creatinine, Ser: 0.89 mg/dL (ref 0.61–1.24)
GFR, Estimated: >60 mL/min (ref >60)
Glucose, Bld: 83 mg/dL (ref 70–99)
Potassium: 4.4 mmol/L (ref 3.5–5.1)
Sodium: 137 mmol/L (ref 135–145)

## 2020-11-08 LAB — PROTIME-INR
INR: 1 (ref 0.8–1.2)
Prothrombin Time: 12.8 seconds (ref 11.4–15.2)

## 2020-11-08 LAB — CBC
HCT: 45.1 % (ref 39.0–52.0)
Hemoglobin: 15.1 g/dL (ref 13.0–17.0)
MCH: 32.3 pg (ref 26.0–34.0)
MCHC: 33.5 g/dL (ref 30.0–36.0)
MCV: 96.6 fL (ref 80.0–100.0)
Platelets: 216 10*3/uL (ref 150–400)
RBC: 4.67 MIL/uL (ref 4.22–5.81)
RDW: 12.2 % (ref 11.5–15.5)
WBC: 7 10*3/uL (ref 4.0–10.5)
nRBC: 0 % (ref 0.0–0.2)

## 2020-11-08 LAB — TYPE AND SCREEN
ABO/RH(D): A NEG
Antibody Screen: NEGATIVE

## 2020-11-08 LAB — SURGICAL PCR SCREEN
MRSA, PCR: NEGATIVE
Staphylococcus aureus: POSITIVE — AB

## 2020-11-08 NOTE — Progress Notes (Signed)
PCP - Malena Lovena Le DO Cardiologist - Dr. Curt Bears sees for his A fib. Had an ablation 3-4 years ago  Chest x-ray - Not indicated EKG - 10/04/20 Stress Test - Years ago ECHO - 07/19/19  Sleep Study - Denies  DM - Denies  Blood Thinner Instructions: Per Dr. Ronnald Ramp will stop Eliquis 3 days prior Aspirin Instructions: Per Dr. Ronnald Ramp will stop 1 week prior  COVID TEST-  Asked to go on 11/16/20   Anesthesia review: Yes cardiac history  Patient denies shortness of breath, fever, cough and chest pain at PAT appointment   All instructions explained to the patient, with a verbal understanding of the material. Patient agrees to go over the instructions while at home for a better understanding. Patient also instructed to wear a mask in public after being tested for COVID-19. The opportunity to ask questions was provided.

## 2020-11-09 NOTE — Progress Notes (Signed)
Anesthesia Chart Review:  Case: [id number] Date/Time: 11/19/20 1318   Procedure: Re-do decompressive laminectomy L3-4 with instrumented fusion (Back)   Anesthesia type: General   Pre-op diagnosis: Spondylolisthesis   Location: [facility] OR ROOM 21 / [facility]   Surgeons: Eustace Moore, MD       DISCUSSION: Patient is a 70 year old male scheduled for the above procedure.  History includes smoking (23.5 pack years), GERD, HLD, A. Fib (DCCV 04/07/16; s/p ablation 07/22/16), skin cancer (BCC, SCC), spinal surgery (L3-4 laminectomy, foraminotomies 12/29/18).  12 standard alcoholic beverages per week is documented.  BMI is consistent with obesity.  Last EP cardiology visit 10/04/2020 by Barrington Ellison, PA-C.  Patient on Eliquis for A. fib history, CHA2DS2-VASc of at least 2.  Patient had not noticed any recent A. fib.  Echo in 2021 showed normal LVEF and stable mild AS.In regards to preoperative evaluation: "Cardiac clearance for re-do L3-4 Lumbar laminectomy fusion Echo last year with normal EF.  No s/s CHF or ischemia He is able to proceed with a relatively low risk of perioperative complications from a cardiac perspective by the Revised Cardiac Risk Index [place] Hayward Criteria).  He verbalizes understanding of risk of stroke holding Show Low and risk of breakthrough AF with a stressor such as surgery."  Patient reported instructions to hold aspirin 1 week prior to surgery and Eliquis 3 days prior to surgery.  Presurgical COVID-19 testing scheduled for 11/16/2020.  Anesthesia team to evaluate on the day of surgery.   VS: BP (!) 153/82   Pulse 65   Temp 36.7 C (Oral)   Resp 18   Ht 5' 8.5" (1.74 m)   Wt 100 kg   SpO2 98%   BMI 33.02 kg/m   PROVIDERS: Erven Colla, DO is PCP Curt Bears, Will, MD is EP cardiologist   LABS: Labs reviewed: Acceptable for surgery. (all labs ordered are listed, but only abnormal results are displayed)  Labs Reviewed  SURGICAL PCR SCREEN - Abnormal; Notable for the following  components:      Result Value   Staphylococcus aureus POSITIVE (*)    All other components within normal limits  BASIC METABOLIC PANEL  CBC  PROTIME-INR  TYPE AND SCREEN    IMAGES: MRI L-spine 06/15/20 and lumbar xrays 09/06/20 images can be viewed in [facility]/PACS.  CXR 04/24/20: FINDINGS: Low lung volumes. Likely mild chronic interstitial prominence. Bibasilar scarring/atelectasis. No significant pleural effusion. No pneumothorax. Cardiomediastinal contours are within normal limits. Decreased osseous mineralization with age-indeterminate mild loss of height of thoracic vertebral bodies. Fractures of the posterior left fifth and sixth ribs. IMPRESSION: - Fractures of the posterior left fifth and sixth ribs. No pneumothorax. Fifth rib fracture may be acute and sixth rib fracture may be chronic. - Bibasilar scarring/atelectasis.   EKG: 10/04/20: NSR. Non-specific ST flattening.   CV: Echo 07/19/19: IMPRESSIONS   1. Left ventricular ejection fraction, by estimation, is 60 to 65%. The  left ventricle has normal function. The left ventricle has no regional  wall motion abnormalities. There is mild concentric left ventricular  hypertrophy. Left ventricular diastolic  parameters are consistent with Grade I diastolic dysfunction (impaired  relaxation).   2. Right ventricular systolic function is normal. The right ventricular  size is normal. Tricuspid regurgitation signal is inadequate for assessing  PA pressure.   3. Left atrial size was mildly dilated.   4. The mitral valve is grossly normal. No evidence of mitral valve  regurgitation. No evidence of mitral stenosis.   5. The aortic  valve is tricuspid. Aortic valve regurgitation is not  visualized. Aortic valve area, by VTI measures 1.60 cm. Aortic valve mean  gradient measures 17.0 mmHg. Aortic valve Vmax measures 2.88 m/s.   6. Aortic dilatation noted. There is mild dilatation of the aortic root  and of the ascending aorta  measuring 40 mm.   7. The inferior vena cava is normal in size with greater than 50%  respiratory variability, suggesting right atrial pressure of 3 mmHg.  - Comparison(s): No significant change from prior study. Normal LVEF. Aortic  stenosis remains mild.   Reported a remote history of prior stress test.   Past Medical History:  Diagnosis Date   Anxiety    Arthritis    Atrial fibrillation ([place])    Basal cell carcinoma    Depression    Dysrhythmia    GERD (gastroesophageal reflux disease)    Hyperlipidemia    Squamous carcinoma     Past Surgical History:  Procedure Laterality Date   ATRIAL FIBRILLATION ABLATION  07/22/2016   ATRIAL FIBRILLATION ABLATION N/A 07/22/2016   Procedure: Atrial Fibrillation Ablation;  Surgeon: Will Meredith Leeds, MD;  Location: [facility];  Service: Cardiovascular;  Laterality: N/A;   BASAL CELL CARCINOMA EXCISION     forehead; top of scalp   BIOPSY  05/27/2017   Procedure: BIOPSY;  Surgeon: [name], MD;  [facility];  Service: Endoscopy;;  right colon, left colon;   CARDIOVERSION N/A 04/07/2016   Procedure: CARDIOVERSION;  Surgeon: Satira Sark, MD;  Location: [facility];  Service: Cardiovascular;  Laterality: N/A;   COLECTOMY  1990s   removal of section due to strangulated bowel.   COLONOSCOPY N/A 05/30/2013   Procedure: COLONOSCOPY;  Surgeon: [name], MD;  [facility];  Service: Endoscopy;  Laterality: N/A;  8:45 AM   COLONOSCOPY     COLONOSCOPY N/A 05/27/2017   Procedure: COLONOSCOPY;  Surgeon: [name], MD;  [facility];  Service: Endoscopy;  Laterality: N/A;  1:00pm   INGUINAL HERNIA REPAIR Right 1990s   LUMBAR LAMINECTOMY/DECOMPRESSION MICRODISCECTOMY N/A 12/29/2018   Procedure: Decompressive lumbar laminectomy at L3-L4;  Surgeon: Latanya Maudlin, MD;  Location: [facility];  Service: Orthopedics;  Laterality: N/A;  134mn   SHOULDER ARTHROSCOPY WITH ROTATOR CUFF REPAIR AND SUBACROMIAL  DECOMPRESSION Left 05/15/2016   Procedure: LEFT SHOULDER ARTHROSCOPY WITH ROTATOR CUFF REPAIR AND SUBACROMIAL DECOMPRESSION AND DISTAL CLAVICLE RESECTION;  Surgeon: KJustice Britain MD;  [facility]  Service: Orthopedics;  Laterality: Left;  requests 2hrs   SHOULDER SURGERY Right    SQUAMOUS CELL CARCINOMA EXCISION     scalp   TONSILLECTOMY      MEDICATIONS:  acetaminophen (TYLENOL) 500 MG tablet   apixaban (ELIQUIS) 5 MG TABS tablet   citalopram (CELEXA) 20 MG tablet   fluticasone (FLONASE) 50 MCG/ACT nasal spray   ibuprofen (ADVIL,MOTRIN) 200 MG tablet   levocetirizine (XYZAL) 5 MG tablet   Melatonin 10 MG TABS   Multiple Vitamin (MULTIVITAMIN) tablet   Naphazoline-Pheniramine (ALLERGY EYE OP)   omeprazole (PRILOSEC) 40 MG capsule   simvastatin (ZOCOR) 20 MG tablet   No current facility-administered medications for this encounter.    AMyra Gianotti PA-C Surgical Short Stay/Anesthesiology MSc[facility] CenterPhone ([phone] N. [facility] Ft. WaynePhone ([phone]/02/2021 5:56 PM

## 2020-11-09 NOTE — Anesthesia Preprocedure Evaluation (Addendum)
Anesthesia Evaluation  Patient identified by MRN, date of birth, ID band Patient awake    Reviewed: Allergy & Precautions, NPO status , Patient's Chart, lab work & pertinent test results  History of Anesthesia Complications Negative for: history of anesthetic complications  Airway Mallampati: II  TM Distance: >3 FB Neck ROM: Full    Dental  (+) Dental Advisory Given, Teeth Intact   Pulmonary Current Smoker and Patient abstained from smoking.,    Pulmonary exam normal        Cardiovascular hypertension, (-) anginaNormal cardiovascular exam+ dysrhythmias Atrial Fibrillation    '21 TTE - EF 60 to 65%. Mild concentric left ventricular  hypertrophy. Grade I diastolic dysfunction (impaired relaxation). Left atrial size was mildly dilated. Aortic valve area, by VTI measures 1.60 cm. Aortic valve mean gradient measures 17.0 mmHg. Aortic valve Vmax measures 2.88 m/s. There is mild dilatation of the aortic root and of the ascending aorta measuring 40 mm.     Neuro/Psych PSYCHIATRIC DISORDERS Anxiety Depression negative neurological ROS     GI/Hepatic Neg liver ROS, GERD  Medicated and Controlled,  Endo/Other   Obesity   Renal/GU negative Renal ROS     Musculoskeletal  (+) Arthritis ,   Abdominal   Peds  Hematology  On eliquis    Anesthesia Other Findings Covid test negative   Reproductive/Obstetrics                           Anesthesia Physical Anesthesia Plan  ASA: 3  Anesthesia Plan: General   Post-op Pain Management:    Induction: Intravenous  PONV Risk Score and Plan: 3 and Treatment may vary due to age or medical condition, Ondansetron and Dexamethasone  Airway Management Planned: Oral ETT  Additional Equipment: None  Intra-op Plan:   Post-operative Plan: Extubation in OR  Informed Consent: I have reviewed the patients History and Physical, chart, labs and discussed the  procedure including the risks, benefits and alternatives for the proposed anesthesia with the patient or authorized representative who has indicated his/her understanding and acceptance.     Dental advisory given  Plan Discussed with: CRNA and Anesthesiologist  Anesthesia Plan Comments:       Anesthesia Quick Evaluation

## 2020-11-12 ENCOUNTER — Other Ambulatory Visit: Payer: Self-pay | Admitting: Family Medicine

## 2020-11-12 DIAGNOSIS — F320 Major depressive disorder, single episode, mild: Secondary | ICD-10-CM

## 2020-11-16 ENCOUNTER — Other Ambulatory Visit: Payer: Self-pay | Admitting: Dermatology

## 2020-11-16 LAB — SARS CORONAVIRUS 2 (TAT 6-24 HRS): SARS Coronavirus 2: NEGATIVE

## 2020-11-19 ENCOUNTER — Encounter (HOSPITAL_COMMUNITY)
Admission: RE | Disposition: A | Discharge status: Home / Self Care | Payer: Self-pay | Source: Home / Self Care | Attending: Neurological Surgery

## 2020-11-19 ENCOUNTER — Encounter (HOSPITAL_COMMUNITY): Payer: Self-pay | Admitting: Neurological Surgery

## 2020-11-19 ENCOUNTER — Observation Stay (HOSPITAL_COMMUNITY)
Admission: RE | Admit: 2020-11-19 | Discharge: 2020-11-20 | Disposition: A | Discharge status: Home / Self Care | Payer: Medicare Other | Attending: Neurological Surgery | Admitting: Neurological Surgery

## 2020-11-19 ENCOUNTER — Inpatient Hospital Stay (HOSPITAL_COMMUNITY): Payer: Medicare Other

## 2020-11-19 ENCOUNTER — Inpatient Hospital Stay (HOSPITAL_COMMUNITY): Payer: Medicare Other | Admitting: Vascular Surgery

## 2020-11-19 ENCOUNTER — Inpatient Hospital Stay (HOSPITAL_COMMUNITY): Payer: Medicare Other | Admitting: Certified Registered Nurse Anesthetist

## 2020-11-19 DIAGNOSIS — M4316 Spondylolisthesis, lumbar region: Secondary | ICD-10-CM | POA: Diagnosis not present

## 2020-11-19 DIAGNOSIS — Z79899 Other long term (current) drug therapy: Secondary | ICD-10-CM | POA: Diagnosis not present

## 2020-11-19 DIAGNOSIS — I4891 Unspecified atrial fibrillation: Secondary | ICD-10-CM | POA: Diagnosis not present

## 2020-11-19 DIAGNOSIS — Z419 Encounter for procedure for purposes other than remedying health state, unspecified: Secondary | ICD-10-CM

## 2020-11-19 DIAGNOSIS — M48061 Spinal stenosis, lumbar region without neurogenic claudication: Secondary | ICD-10-CM | POA: Diagnosis not present

## 2020-11-19 DIAGNOSIS — Z7901 Long term (current) use of anticoagulants: Secondary | ICD-10-CM | POA: Insufficient documentation

## 2020-11-19 DIAGNOSIS — M961 Postlaminectomy syndrome, not elsewhere classified: Secondary | ICD-10-CM | POA: Diagnosis not present

## 2020-11-19 DIAGNOSIS — M549 Dorsalgia, unspecified: Secondary | ICD-10-CM | POA: Diagnosis present

## 2020-11-19 DIAGNOSIS — M48062 Spinal stenosis, lumbar region with neurogenic claudication: Secondary | ICD-10-CM | POA: Diagnosis not present

## 2020-11-19 DIAGNOSIS — Z85828 Personal history of other malignant neoplasm of skin: Secondary | ICD-10-CM | POA: Insufficient documentation

## 2020-11-19 DIAGNOSIS — Z981 Arthrodesis status: Secondary | ICD-10-CM

## 2020-11-19 DIAGNOSIS — F17210 Nicotine dependence, cigarettes, uncomplicated: Secondary | ICD-10-CM | POA: Diagnosis not present

## 2020-11-19 DIAGNOSIS — Z4889 Encounter for other specified surgical aftercare: Secondary | ICD-10-CM | POA: Diagnosis not present

## 2020-11-19 DIAGNOSIS — E785 Hyperlipidemia, unspecified: Secondary | ICD-10-CM | POA: Diagnosis not present

## 2020-11-19 DIAGNOSIS — I4819 Other persistent atrial fibrillation: Secondary | ICD-10-CM | POA: Diagnosis not present

## 2020-11-19 SURGERY — POSTERIOR LUMBAR FUSION 1 LEVEL
Anesthesia: General | Site: Back

## 2020-11-19 MED ORDER — DEXAMETHASONE SODIUM PHOSPHATE 4 MG/ML IJ SOLN
INTRAMUSCULAR | Status: DC | PRN
  Administered 2020-11-19: 8 mg via INTRAVENOUS

## 2020-11-19 MED ORDER — SODIUM CHLORIDE 0.9 % IV SOLN: 250.0000 mL | INTRAVENOUS | Status: DC

## 2020-11-19 MED ORDER — HYDROMORPHONE HCL 1 MG/ML IJ SOLN
0.5000 mg | INTRAMUSCULAR | Status: DC | PRN
Start: 2020-11-19 — End: 2020-11-20

## 2020-11-19 MED ORDER — SODIUM CHLORIDE 0.9 % IV SOLN
INTRAVENOUS | Status: DC | PRN
  Administered 2020-11-19: 40 ug/min via INTRAVENOUS

## 2020-11-19 MED ORDER — FENTANYL CITRATE (PF) 100 MCG/2ML IJ SOLN
25.0000 ug | INTRAMUSCULAR | Status: DC | PRN
  Administered 2020-11-19 (×2): 50 ug via INTRAVENOUS

## 2020-11-19 MED ORDER — LORATADINE 10 MG PO TABS
10.0000 mg | ORAL_TABLET | Freq: Every day | ORAL | Status: DC
  Administered 2020-11-19: 10 mg via ORAL
  Filled 2020-11-19: qty 1

## 2020-11-19 MED ORDER — OXYCODONE HCL 5 MG PO TABS: 5.0000 mg | ORAL_TABLET | Freq: Once | ORAL | Status: DC | PRN

## 2020-11-19 MED ORDER — PHENYLEPHRINE HCL (PRESSORS) 10 MG/ML IV SOLN
INTRAVENOUS | Status: DC | PRN
  Administered 2020-11-19 (×2): 80 ug via INTRAVENOUS

## 2020-11-19 MED ORDER — 0.9 % SODIUM CHLORIDE (POUR BTL) OPTIME
TOPICAL | Status: DC | PRN
  Administered 2020-11-19 (×2): 1000 mL

## 2020-11-19 MED ORDER — ROCURONIUM BROMIDE 10 MG/ML (PF) SYRINGE
PREFILLED_SYRINGE | INTRAVENOUS | Status: DC | PRN
  Administered 2020-11-19: 20 mg via INTRAVENOUS
  Administered 2020-11-19: 10 mg via INTRAVENOUS
  Administered 2020-11-19: 50 mg via INTRAVENOUS

## 2020-11-19 MED ORDER — ACETAMINOPHEN 650 MG RE SUPP: 650.0000 mg | RECTAL | Status: DC | PRN

## 2020-11-19 MED ORDER — METHOCARBAMOL 500 MG PO TABS
500.0000 mg | ORAL_TABLET | Freq: Four times a day (QID) | ORAL | Status: DC | PRN
  Administered 2020-11-19: 500 mg via ORAL
  Filled 2020-11-19: qty 1

## 2020-11-19 MED ORDER — MIDAZOLAM HCL 5 MG/5ML IJ SOLN
INTRAMUSCULAR | Status: DC | PRN
  Administered 2020-11-19: 2 mg via INTRAVENOUS

## 2020-11-19 MED ORDER — PHENYLEPHRINE 40 MCG/ML (10ML) SYRINGE FOR IV PUSH (FOR BLOOD PRESSURE SUPPORT)
PREFILLED_SYRINGE | INTRAVENOUS | Status: AC
  Filled 2020-11-19: qty 10

## 2020-11-19 MED ORDER — DEXAMETHASONE 4 MG PO TABS: 4.0000 mg | ORAL_TABLET | Freq: Four times a day (QID) | ORAL | Status: DC

## 2020-11-19 MED ORDER — EPHEDRINE 5 MG/ML INJ
INTRAVENOUS | Status: AC
  Filled 2020-11-19: qty 5

## 2020-11-19 MED ORDER — MIDAZOLAM HCL 2 MG/2ML IJ SOLN
INTRAMUSCULAR | Status: AC
  Filled 2020-11-19: qty 2

## 2020-11-19 MED ORDER — ACETAMINOPHEN 500 MG PO TABS
1000.0000 mg | ORAL_TABLET | ORAL | Status: AC
  Administered 2020-11-19: 1000 mg via ORAL
  Filled 2020-11-19: qty 2

## 2020-11-19 MED ORDER — LEVOCETIRIZINE DIHYDROCHLORIDE 5 MG PO TABS: 5.0000 mg | ORAL_TABLET | Freq: Every day | ORAL | Status: DC

## 2020-11-19 MED ORDER — BUPIVACAINE HCL (PF) 0.25 % IJ SOLN
INTRAMUSCULAR | Status: DC | PRN
  Administered 2020-11-19: 8 mL

## 2020-11-19 MED ORDER — ADULT MULTIVITAMIN W/MINERALS CH
1.0000 | ORAL_TABLET | Freq: Every day | ORAL | Status: DC
  Administered 2020-11-19: 1 via ORAL
  Filled 2020-11-19: qty 1

## 2020-11-19 MED ORDER — ONDANSETRON HCL 4 MG/2ML IJ SOLN
INTRAMUSCULAR | Status: AC
  Filled 2020-11-19: qty 2

## 2020-11-19 MED ORDER — SODIUM CHLORIDE 0.9% FLUSH: 3.0000 mL | Freq: Two times a day (BID) | INTRAVENOUS | Status: DC

## 2020-11-19 MED ORDER — MENTHOL 3 MG MT LOZG: 1.0000 | LOZENGE | OROMUCOSAL | Status: DC | PRN

## 2020-11-19 MED ORDER — PHENYLEPHRINE 40 MCG/ML (10ML) SYRINGE FOR IV PUSH (FOR BLOOD PRESSURE SUPPORT)
PREFILLED_SYRINGE | INTRAVENOUS | Status: AC
  Filled 2020-11-19: qty 30

## 2020-11-19 MED ORDER — ONDANSETRON HCL 4 MG/2ML IJ SOLN
INTRAMUSCULAR | Status: DC | PRN
Start: 2020-11-19 — End: 2020-11-19
  Administered 2020-11-19 (×2): 4 mg via INTRAVENOUS

## 2020-11-19 MED ORDER — ORAL CARE MOUTH RINSE
15.0000 mL | Freq: Once | OROMUCOSAL | Status: AC
Start: 2020-11-19 — End: 2020-11-19

## 2020-11-19 MED ORDER — ONDANSETRON HCL 4 MG/2ML IJ SOLN: 4.0000 mg | Freq: Four times a day (QID) | INTRAMUSCULAR | Status: DC | PRN

## 2020-11-19 MED ORDER — LACTATED RINGERS IV SOLN: INTRAVENOUS | Status: DC

## 2020-11-19 MED ORDER — DEXAMETHASONE SODIUM PHOSPHATE 10 MG/ML IJ SOLN
10.0000 mg | Freq: Once | INTRAMUSCULAR | Status: DC
  Filled 2020-11-19: qty 1

## 2020-11-19 MED ORDER — ROCURONIUM BROMIDE 10 MG/ML (PF) SYRINGE
PREFILLED_SYRINGE | INTRAVENOUS | Status: AC
  Filled 2020-11-19: qty 10

## 2020-11-19 MED ORDER — CHLORHEXIDINE GLUCONATE CLOTH 2 % EX PADS: 6.0000 | MEDICATED_PAD | Freq: Once | CUTANEOUS | Status: DC

## 2020-11-19 MED ORDER — PHENOL 1.4 % MT LIQD: 1.0000 | OROMUCOSAL | Status: DC | PRN

## 2020-11-19 MED ORDER — THROMBIN 20000 UNITS EX SOLR
CUTANEOUS | Status: DC | PRN
  Administered 2020-11-19: 20 mL via TOPICAL

## 2020-11-19 MED ORDER — FENTANYL CITRATE (PF) 100 MCG/2ML IJ SOLN
INTRAMUSCULAR | Status: AC
  Filled 2020-11-19: qty 2

## 2020-11-19 MED ORDER — ONDANSETRON HCL 4 MG/2ML IJ SOLN: 4.0000 mg | Freq: Once | INTRAMUSCULAR | Status: DC | PRN

## 2020-11-19 MED ORDER — DEXAMETHASONE SODIUM PHOSPHATE 4 MG/ML IJ SOLN
4.0000 mg | Freq: Four times a day (QID) | INTRAMUSCULAR | Status: DC
  Administered 2020-11-19 – 2020-11-20 (×3): 4 mg via INTRAVENOUS
  Filled 2020-11-19 (×3): qty 1

## 2020-11-19 MED ORDER — OXYCODONE HCL 5 MG PO TABS
10.0000 mg | ORAL_TABLET | ORAL | Status: DC | PRN
  Administered 2020-11-19 – 2020-11-20 (×2): 10 mg via ORAL
  Filled 2020-11-19 (×2): qty 2

## 2020-11-19 MED ORDER — ONDANSETRON HCL 4 MG PO TABS: 4.0000 mg | ORAL_TABLET | Freq: Four times a day (QID) | ORAL | Status: DC | PRN

## 2020-11-19 MED ORDER — SENNA 8.6 MG PO TABS
1.0000 | ORAL_TABLET | Freq: Two times a day (BID) | ORAL | Status: DC
  Administered 2020-11-19: 8.6 mg via ORAL
  Filled 2020-11-19: qty 1

## 2020-11-19 MED ORDER — CEFAZOLIN SODIUM-DEXTROSE 2-4 GM/100ML-% IV SOLN
2.0000 g | INTRAVENOUS | Status: AC
  Administered 2020-11-19: 2 g via INTRAVENOUS
  Filled 2020-11-19: qty 100

## 2020-11-19 MED ORDER — PROPOFOL 10 MG/ML IV BOLUS
INTRAVENOUS | Status: DC | PRN
  Administered 2020-11-19: 150 mg via INTRAVENOUS

## 2020-11-19 MED ORDER — CITALOPRAM HYDROBROMIDE 20 MG PO TABS
20.0000 mg | ORAL_TABLET | Freq: Every day | ORAL | Status: DC
  Administered 2020-11-19: 20 mg via ORAL
  Filled 2020-11-19: qty 1

## 2020-11-19 MED ORDER — CEFAZOLIN SODIUM-DEXTROSE 2-4 GM/100ML-% IV SOLN
2.0000 g | Freq: Three times a day (TID) | INTRAVENOUS | Status: AC
  Administered 2020-11-19 – 2020-11-20 (×2): 2 g via INTRAVENOUS
  Filled 2020-11-19 (×2): qty 100

## 2020-11-19 MED ORDER — SODIUM CHLORIDE 0.9% FLUSH: 3.0000 mL | INTRAVENOUS | Status: DC | PRN

## 2020-11-19 MED ORDER — EPHEDRINE SULFATE 50 MG/ML IJ SOLN
INTRAMUSCULAR | Status: DC | PRN
  Administered 2020-11-19: 15 mg via INTRAVENOUS

## 2020-11-19 MED ORDER — GABAPENTIN 300 MG PO CAPS
300.0000 mg | ORAL_CAPSULE | ORAL | Status: AC
  Administered 2020-11-19: 300 mg via ORAL
  Filled 2020-11-19: qty 1

## 2020-11-19 MED ORDER — LIDOCAINE HCL (CARDIAC) PF 100 MG/5ML IV SOSY
PREFILLED_SYRINGE | INTRAVENOUS | Status: DC | PRN
  Administered 2020-11-19: 60 mg via INTRAVENOUS

## 2020-11-19 MED ORDER — ACETAMINOPHEN 325 MG PO TABS: 650.0000 mg | ORAL_TABLET | ORAL | Status: DC | PRN

## 2020-11-19 MED ORDER — OXYCODONE HCL 5 MG/5ML PO SOLN: 5.0000 mg | Freq: Once | ORAL | Status: DC | PRN

## 2020-11-19 MED ORDER — BUPIVACAINE HCL (PF) 0.25 % IJ SOLN
INTRAMUSCULAR | Status: AC
  Filled 2020-11-19: qty 30

## 2020-11-19 MED ORDER — POTASSIUM CHLORIDE IN NACL 20-0.9 MEQ/L-% IV SOLN: INTRAVENOUS | Status: DC

## 2020-11-19 MED ORDER — METHOCARBAMOL 1000 MG/10ML IJ SOLN
500.0000 mg | Freq: Four times a day (QID) | INTRAVENOUS | Status: DC | PRN
  Filled 2020-11-19: qty 5

## 2020-11-19 MED ORDER — ACETAMINOPHEN 500 MG PO TABS
1000.0000 mg | ORAL_TABLET | Freq: Four times a day (QID) | ORAL | Status: DC
  Administered 2020-11-19 – 2020-11-20 (×3): 1000 mg via ORAL
  Filled 2020-11-19 (×3): qty 2

## 2020-11-19 MED ORDER — THROMBIN 5000 UNITS EX SOLR
OROMUCOSAL | Status: DC | PRN
  Administered 2020-11-19: 5 mL via TOPICAL

## 2020-11-19 MED ORDER — THROMBIN 5000 UNITS EX SOLR
CUTANEOUS | Status: AC
  Filled 2020-11-19: qty 5000

## 2020-11-19 MED ORDER — LIDOCAINE 2% (20 MG/ML) 5 ML SYRINGE
INTRAMUSCULAR | Status: AC
  Filled 2020-11-19: qty 10

## 2020-11-19 MED ORDER — THROMBIN 20000 UNITS EX SOLR
CUTANEOUS | Status: AC
  Filled 2020-11-19: qty 20000

## 2020-11-19 MED ORDER — CHLORHEXIDINE GLUCONATE 0.12 % MT SOLN
15.0000 mL | Freq: Once | OROMUCOSAL | Status: AC
  Administered 2020-11-19: 15 mL via OROMUCOSAL
  Filled 2020-11-19: qty 15

## 2020-11-19 MED ORDER — FENTANYL CITRATE (PF) 100 MCG/2ML IJ SOLN
INTRAMUSCULAR | Status: DC | PRN
  Administered 2020-11-19: 100 ug via INTRAVENOUS
  Administered 2020-11-19: 50 ug via INTRAVENOUS
  Administered 2020-11-19: 100 ug via INTRAVENOUS

## 2020-11-19 MED ORDER — SUGAMMADEX SODIUM 200 MG/2ML IV SOLN
INTRAVENOUS | Status: DC | PRN
  Administered 2020-11-19: 200 mg via INTRAVENOUS

## 2020-11-19 MED ORDER — FENTANYL CITRATE (PF) 250 MCG/5ML IJ SOLN
INTRAMUSCULAR | Status: AC
  Filled 2020-11-19: qty 5

## 2020-11-19 SURGICAL SUPPLY — 68 items
ADH SKN CLS APL DERMABOND .7 (GAUZE/BANDAGES/DRESSINGS) ×1
ADH SKN CLS LQ APL DERMABOND (GAUZE/BANDAGES/DRESSINGS) ×1
ALLOGRAFT BONE ARTHROCELL 5 (Bone Implant) ×2 IMPLANT
APL SKNCLS STERI-STRIP NONHPOA (GAUZE/BANDAGES/DRESSINGS) ×1
BAG COUNTER SPONGE SURGICOUNT (BAG) ×2 IMPLANT
BAG SPNG CNTER NS LX DISP (BAG) ×1
BASKET BONE COLLECTION (BASKET) ×2 IMPLANT
BENZOIN TINCTURE PRP APPL 2/3 (GAUZE/BANDAGES/DRESSINGS) ×2 IMPLANT
BLADE CLIPPER SURG (BLADE) ×1 IMPLANT
BUR CARBIDE MATCH 3.0 (BURR) ×2 IMPLANT
CANISTER SUCT 3000ML PPV (MISCELLANEOUS) ×2 IMPLANT
CLIP SPRING STIM LLIF SAFEOP (CLIP) ×1 IMPLANT
CLSR STERI-STRIP ANTIMIC 1/2X4 (GAUZE/BANDAGES/DRESSINGS) ×1 IMPLANT
CNTNR URN SCR LID CUP LEK RST (MISCELLANEOUS) ×1 IMPLANT
CONT SPEC 4OZ STRL OR WHT (MISCELLANEOUS) ×4
COVER BACK TABLE 60X90IN (DRAPES) ×2 IMPLANT
DERMABOND ADHESIVE PROPEN (GAUZE/BANDAGES/DRESSINGS) ×1
DERMABOND ADVANCED (GAUZE/BANDAGES/DRESSINGS) ×1
DERMABOND ADVANCED .7 DNX12 (GAUZE/BANDAGES/DRESSINGS) ×1 IMPLANT
DERMABOND ADVANCED .7 DNX6 (GAUZE/BANDAGES/DRESSINGS) IMPLANT
DRAPE C-ARM 42X72 X-RAY (DRAPES) ×6 IMPLANT
DRAPE LAPAROTOMY 100X72X124 (DRAPES) ×2 IMPLANT
DRAPE SURG 17X23 STRL (DRAPES) ×2 IMPLANT
DRSG OPSITE POSTOP 4X8 (GAUZE/BANDAGES/DRESSINGS) ×1 IMPLANT
DURAPREP 26ML APPLICATOR (WOUND CARE) ×2 IMPLANT
ELECT KIT SAFEOP EMG/SURF (KITS) ×2
ELECT REM PT RETURN 9FT ADLT (ELECTROSURGICAL) ×2
ELECTRODE KT SAFEOP EMG/SURF (KITS) IMPLANT
ELECTRODE REM PT RTRN 9FT ADLT (ELECTROSURGICAL) ×1 IMPLANT
EVACUATOR 1/8 PVC DRAIN (DRAIN) ×2 IMPLANT
FIBER BONE ALLOSYNC EXPAND 5 (Bone Implant) ×2 IMPLANT
GAUZE 4X4 16PLY ~~LOC~~+RFID DBL (SPONGE) ×1 IMPLANT
GLOVE SURG ENC MOIS LTX SZ7 (GLOVE) IMPLANT
GLOVE SURG ENC MOIS LTX SZ8 (GLOVE) ×5 IMPLANT
GLOVE SURG UNDER POLY LF SZ7 (GLOVE) ×1 IMPLANT
GOWN STRL REUS W/ TWL LRG LVL3 (GOWN DISPOSABLE) IMPLANT
GOWN STRL REUS W/ TWL XL LVL3 (GOWN DISPOSABLE) ×2 IMPLANT
GOWN STRL REUS W/TWL 2XL LVL3 (GOWN DISPOSABLE) ×1 IMPLANT
GOWN STRL REUS W/TWL LRG LVL3 (GOWN DISPOSABLE) ×4
GOWN STRL REUS W/TWL XL LVL3 (GOWN DISPOSABLE) ×4
HEMOSTAT POWDER KIT SURGIFOAM (HEMOSTASIS) ×1 IMPLANT
KIT BASIN OR (CUSTOM PROCEDURE TRAY) ×2 IMPLANT
KIT BONE MRW ASP ANGEL CPRP (KITS) IMPLANT
KIT TURNOVER KIT B (KITS) ×2 IMPLANT
MILL MEDIUM DISP (BLADE) ×2 IMPLANT
NDL HYPO 25X1 1.5 SAFETY (NEEDLE) ×1 IMPLANT
NEEDLE HYPO 25X1 1.5 SAFETY (NEEDLE) ×2 IMPLANT
NS IRRIG 1000ML POUR BTL (IV SOLUTION) ×3 IMPLANT
PACK LAMINECTOMY NEURO (CUSTOM PROCEDURE TRAY) ×2 IMPLANT
PAD ARMBOARD 7.5X6 YLW CONV (MISCELLANEOUS) ×5 IMPLANT
ROD LORD LIPPED TI 5.5X35 (Rod) ×2 IMPLANT
SCREW CORT SHANK MOD 6.5X40 (Screw) ×2 IMPLANT
SCREW KODIAK 6.5X40 (Screw) ×2 IMPLANT
SCREW POLYAXIAL TULIP (Screw) ×2 IMPLANT
SET SCREW (Screw) ×8 IMPLANT
SET SCREW SPNE (Screw) IMPLANT
SPACER BATTALION 5 .25X10MM 9 (Spacer) ×2 IMPLANT
SPONGE SURGIFOAM ABS GEL 100 (HEMOSTASIS) ×2 IMPLANT
SPONGE T-LAP 4X18 ~~LOC~~+RFID (SPONGE) ×2 IMPLANT
STRIP CLOSURE SKIN 1/2X4 (GAUZE/BANDAGES/DRESSINGS) ×4 IMPLANT
SUT VIC AB 0 CT1 18XCR BRD8 (SUTURE) ×1 IMPLANT
SUT VIC AB 0 CT1 8-18 (SUTURE) ×2
SUT VIC AB 2-0 CP2 18 (SUTURE) ×2 IMPLANT
SUT VIC AB 3-0 SH 8-18 (SUTURE) ×4 IMPLANT
TOWEL GREEN STERILE (TOWEL DISPOSABLE) ×2 IMPLANT
TOWEL GREEN STERILE FF (TOWEL DISPOSABLE) ×2 IMPLANT
TRAY FOLEY MTR SLVR 16FR STAT (SET/KITS/TRAYS/PACK) ×2 IMPLANT
WATER STERILE IRR 1000ML POUR (IV SOLUTION) ×2 IMPLANT

## 2020-11-19 NOTE — Transfer of Care (Signed)
Immediate Anesthesia Transfer of Care Note  Patient: Jon Jackson  Procedure(s) Performed: Re-do Decompressive Laminectomy Lumbar Three-Four  with instrumented fusion (Back)  Patient Location: PACU  Anesthesia Type:General  Level of Consciousness: awake, alert , oriented and patient cooperative  Airway & Oxygen Therapy: Patient Spontanous Breathing  Post-op Assessment: Report given to RN and Post -op Vital signs reviewed and stable  Post vital signs: Reviewed and stable  Last Vitals:  Vitals Value Taken Time  BP 159/90 11/19/20 1747  Temp    Pulse 83 11/19/20 1754  Resp 11 11/19/20 1754  SpO2 93 % 11/19/20 1754  Vitals shown include unvalidated device data.  Last Pain:  Vitals:   11/19/20 1218  TempSrc:   PainSc: 0-No pain         Complications: No notable events documented.

## 2020-11-19 NOTE — Anesthesia Procedure Notes (Addendum)
Procedure Name: Intubation Date/Time: 11/19/2020 2:45 PM Performed by: Minerva Ends, CRNA Pre-anesthesia Checklist: Patient identified Patient Re-evaluated:Patient Re-evaluated prior to induction Oxygen Delivery Method: Circle system utilized Preoxygenation: Pre-oxygenation with 100% oxygen Induction Type: IV induction Ventilation: Mask ventilation without difficulty Laryngoscope Size: 3 and Mac Grade View: Grade I Tube type: Oral Tube size: 7.0 mm Number of attempts: 1 Airway Equipment and Method: Stylet Placement Confirmation: ETT inserted through vocal cords under direct vision, positive ETCO2, CO2 detector and breath sounds checked- equal and bilateral Secured at: 22 cm Tube secured with: Tape

## 2020-11-19 NOTE — H&P (Signed)
Subjective: Patient is a 70 y.o. male admitted for back and leg pain.  Previous decompressive laminectomy at L3-4 by another surgeon in a couple years ago.  Onset of symptoms was several months ago, gradually worsening since that time.  The pain is rated severe, and is located at the across the lower back and radiates to legs. The pain is described as aching and occurs all day. The symptoms have been progressive. Symptoms are exacerbated by exercise and standing. MRI or CT showed recurrent stenosis with spondylolisthesis at L3-4  Past Medical History:  Diagnosis Date   Anxiety    Arthritis    Atrial fibrillation ([facility])    Basal cell carcinoma    Depression    Dysrhythmia    GERD (gastroesophageal reflux disease)    Hyperlipidemia    Squamous carcinoma     Past Surgical History:  Procedure Laterality Date   ATRIAL FIBRILLATION ABLATION  07/22/2016   ATRIAL FIBRILLATION ABLATION N/A 07/22/2016   Procedure: Atrial Fibrillation Ablation;  Surgeon: Will Meredith Leeds, MD;  Location: [facility];  Service: Cardiovascular;  Laterality: N/A;   BASAL CELL CARCINOMA EXCISION     forehead; top of scalp   BIOPSY  05/27/2017   Procedure: BIOPSY;  Surgeon: [name], MD;  [facility];  Service: Endoscopy;;  right colon, left colon;   CARDIOVERSION N/A 04/07/2016   Procedure: CARDIOVERSION;  Surgeon: Satira Sark, MD;  [facility];  Service: Cardiovascular;  Laterality: N/A;   COLECTOMY  1990s   removal of section due to strangulated bowel.   COLONOSCOPY N/A 05/30/2013   Procedure: COLONOSCOPY;  Surgeon: [name], MD;  [facility];  Service: Endoscopy;  Laterality: N/A;  8:45 AM   COLONOSCOPY     COLONOSCOPY N/A 05/27/2017   Procedure: COLONOSCOPY;  Surgeon: [name], MD;  [facility];  Service: Endoscopy;  Laterality: N/A;  1:00pm   INGUINAL HERNIA REPAIR Right 1990s   LUMBAR LAMINECTOMY/DECOMPRESSION MICRODISCECTOMY N/A 12/29/2018    Procedure: Decompressive lumbar laminectomy at L3-L4;  Surgeon: Latanya Maudlin, MD;  [facility];  Service: Orthopedics;  Laterality: N/A;  137mn   SHOULDER ARTHROSCOPY WITH ROTATOR CUFF REPAIR AND SUBACROMIAL DECOMPRESSION Left 05/15/2016   Procedure: LEFT SHOULDER ARTHROSCOPY WITH ROTATOR CUFF REPAIR AND SUBACROMIAL DECOMPRESSION AND DISTAL CLAVICLE RESECTION;  Surgeon: KJustice Britain MD;  [facility]  Service: Orthopedics;  Laterality: Left;  requests 2hrs   SHOULDER SURGERY Right    SQUAMOUS CELL CARCINOMA EXCISION     scalp   TONSILLECTOMY      Prior to Admission medications   Medication Sig Start Date End Date Taking? Authorizing Provider  acetaminophen (TYLENOL) 500 MG tablet Take 1 tablet (500 mg total) by mouth every 6 (six) hours as needed. Patient taking differently: Take 1,000 mg by mouth every 6 (six) hours as needed for moderate pain. 04/24/20  Yes Avegno, KDarrelyn Hillock FNP  apixaban (ELIQUIS) 5 MG TABS tablet Take 1 tablet (5 mg total) by mouth 2 (two) times daily. 12/21/18  Yes Camnitz, Will MHassell Done MD  citalopram (CELEXA) 20 MG tablet Take 1 tablet (20 mg total) by mouth daily. 11/12/20  Yes TLovena Le Malena M, DO  ibuprofen (ADVIL,MOTRIN) 200 MG tablet Take 400-600 mg by mouth every 6 (six) hours as needed for headache or moderate pain.    Yes [provider]  levocetirizine (XYZAL) 5 MG tablet Take 5 mg by mouth daily.   Yes [provider]  Melatonin 10 MG TABS Take 10 mg by mouth at bedtime as needed (sleep).   Yes [provider]  Multiple Vitamin (MULTIVITAMIN) tablet Take 1 tablet by mouth daily.   Yes [provider]  Naphazoline-Pheniramine (ALLERGY EYE OP) Place 1-2 drops into both eyes 3 (three) times daily as needed (for seasonal allergy/itchy eyes).   Yes [provider]  omeprazole (PRILOSEC) 40 MG capsule Take 1 capsule by mouth once daily 08/13/20  Yes Taylor, Malena M, DO  fluticasone (FLONASE) 50 MCG/ACT nasal  spray Place 1 spray into both nostrils daily as needed for allergies or rhinitis.    [provider]  simvastatin (ZOCOR) 20 MG tablet Take 0.5 tablets (10 mg total) by mouth daily. 06/25/20   Erven Colla, DO   Allergies  Allergen Reactions   Tramadol Other (See Comments)    Head felt funny    Social History   Tobacco Use   Smoking status: Some Days    Packs/day: 0.50    Years: 47.00    Pack years: 23.50    Types: Cigars, Cigarettes   Smokeless tobacco: Never  Substance Use Topics   Alcohol use: Yes    Alcohol/week: 12.0 standard drinks    Types: 12 Cans of beer per week    Comment: daily 3-4 in the summer; soemtimes more on weekends.    Family History  Problem Relation Age of Onset   Colon cancer Brother 53   Throat cancer Brother    Breast cancer Mother      Review of Systems  Positive ROS: Negative  All other systems have been reviewed and were otherwise negative with the exception of those mentioned in the HPI and as above.  Objective: Vital signs in last 24 hours: Temp:  [98.3 F (36.8 C)] 98.3 F (36.8 C) (08/22 1150) Pulse Rate:  [79] 79 (08/22 1150) Resp:  [18] 18 (08/22 1150) BP: (189)/(84) 189/84 (08/22 1150) SpO2:  [95 %] 95 % (08/22 1150) Weight:  [99.8 kg] 99.8 kg (08/22 1150)  General Appearance: Alert, cooperative, no distress, appears stated age Head: Normocephalic, without obvious abnormality, atraumatic Eyes: PERRL, conjunctiva/corneas clear, EOM's intact    Neck: Supple, symmetrical, trachea midline Back: Symmetric, no curvature, ROM normal, no CVA tenderness Lungs:  respirations unlabored Heart: Regular rate and rhythm Abdomen: Soft, non-tender Extremities: Extremities normal, atraumatic, no cyanosis or edema Pulses: 2+ and symmetric all extremities Skin: Skin color, texture, turgor normal, no rashes or lesions  NEUROLOGIC:   Mental status: Alert and oriented x4,  no aphasia, good attention span, fund of knowledge, and  memory Motor Exam - grossly normal Sensory Exam - grossly normal Reflexes: Trace Coordination - grossly normal Gait - grossly normal Balance - grossly normal Cranial Nerves: I: smell Not tested  II: visual acuity  OS: nl    OD: nl  II: visual fields Full to confrontation  II: pupils Equal, round, reactive to light  III,VII: ptosis None  III,IV,VI: extraocular muscles  Full ROM  V: mastication Normal  V: facial light touch sensation  Normal  V,VII: corneal reflex  Present  VII: facial muscle function - upper  Normal  VII: facial muscle function - lower Normal  VIII: hearing Not tested  IX: soft palate elevation  Normal  IX,X: gag reflex Present  XI: trapezius strength  5/5  XI: sternocleidomastoid strength 5/5  XI: neck flexion strength  5/5  XII: tongue strength  Normal    Data Review Lab Results  Component Value  Date   WBC 7.0 11/08/2020   HGB 15.1 11/08/2020   HCT 45.1 11/08/2020   MCV 96.6 11/08/2020   PLT 216 11/08/2020   Lab Results  Component Value Date   NA 137 11/08/2020   K 4.4 11/08/2020   CL 106 11/08/2020   CO2 24 11/08/2020   BUN 13 11/08/2020   CREATININE 0.89 11/08/2020   GLUCOSE 83 11/08/2020   Lab Results  Component Value Date   INR 1.0 11/08/2020    Assessment/Plan:  Estimated body mass index is 32.96 kg/m as calculated from the following:   Height as of this encounter: 5' 8.5" (1.74 m).   Weight as of this encounter: 99.8 kg. Patient admitted for decompression and instrumented fusion L3-4 with possible interbody fusion. patient has failed a reasonable attempt at conservative therapy.  I explained the condition and procedure to the patient and answered any questions.  Patient wishes to proceed with procedure as planned. Understands risks/ benefits and typical outcomes of procedure.   Eustace Moore 11/19/2020 1:53 PM

## 2020-11-19 NOTE — Op Note (Signed)
11/19/2020  5:38 PM  PATIENT:  Jon Jackson  70 y.o. 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 Decompressive lumbar laminectomy, hemi facetectomy and foraminotomies L3-4 requiring more work than would be required for a simple exposure of the disk for PLIF in order to adequately decompress the neural elements and address the spinal stenosis 2. Posterior lumbar interbody fusion L3-4 using peek interbody cages packed with morcellized allograft and autograft  3. Posterior fixation L3-4 using Alphatec cortical pedicle screws.  4. Intertransverse arthrodesis L3-4 right using morcellized autograft and allograft.  SURGEON:  Sherley Bounds, MD  ASSISTANTS: Glenford Peers FNP  ANESTHESIA:  General  EBL: 200 ml  Total I/O In: 1000 [I.V.:1000] Out: 900 [Urine:700; Blood:200]  BLOOD ADMINISTERED:none  DRAINS: none   INDICATION FOR PROCEDURE: This patient presented with recurrent back and leg pain. Imaging revealed postlaminectomy spondylolisthesis with severe recurrent stenosis at L3-4. The patient tried a reasonable attempt at conservative medical measures without relief. I recommended decompression and instrumented fusion to address the stenosis as well as the segmental  instability.  Patient understood the risks, benefits, and alternatives and potential outcomes and wished to proceed.  PROCEDURE DETAILS:  The patient was brought to the operating room. After induction of generalized endotracheal anesthesia the patient was rolled into the prone position on chest rolls and all pressure points were padded. The patient's lumbar region was cleaned and then prepped with DuraPrep and draped in the usual sterile fashion. Anesthesia was injected and then a dorsal midline incision was made and carried down to the lumbosacral fascia. The fascia was opened and the  paraspinous musculature was taken down in a subperiosteal fashion to expose L3-4. A self-retaining retractor was placed.  There was no spinous process and I carried the dissection out over the facets and identify the transverse processes of L3 and L4.  Identified the pars.  Intraoperative fluoroscopy confirmed my level, and I started with placement of the L3 cortical pedicle screws. The pedicle screw entry zones were identified utilizing surface landmarks and  AP and lateral fluoroscopy. I scored the cortex with the high-speed drill and then used the hand drill to drill an upward and outward direction into the pedicle. I then tapped line to line. I then placed a 6.5 x 40 mm cortical pedicle screw into the pedicles of L3 bilaterally.    I then turned my attention to the decompression and complete lumbar laminectomies, hemi- facetectomies, and foraminotomies were performed at L3-4.  My nurse practitioner was directly involved in the decompression and exposure of the neural elements. the patient had significant spinal stenosis and this required more work than would be required for a simple exposure of the disc for posterior lumbar interbody fusion which would only require a limited laminotomy. Much more generous decompression and generous foraminotomy was undertaken in order to adequately decompress the neural elements and address the patient's leg pain. The yellow ligament was removed to expose the underlying dura and nerve roots, and generous foraminotomies were performed to adequately decompress the neural elements. Both the exiting and traversing nerve roots were decompressed on both sides until a coronary dilator passed easily along the nerve roots. Once the decompression was complete, I turned my attention to the posterior lower lumbar interbody fusion. The epidural venous vasculature was coagulated and cut sharply. Disc space was incised and the initial discectomy was performed with pituitary rongeurs. The  disc space was distracted with sequential distractors to a height of 10 mm. We then used a series of scrapers and shavers to prepare the endplates for fusion. The midline was prepared with Epstein curettes. Once the complete discectomy was finished, we packed an appropriate sized interbody cage with local autograft and morcellized allograft, gently retracted the nerve root, and tapped the cage into position at L3-4.  The midline between the cages was packed with morselized autograft and allograft.   We then turned our attention to the placement of the lower pedicle screws. The pedicle screw entry zones were identified utilizing surface landmarks and fluoroscopy. I drilled into each pedicle utilizing the hand drill, and tapped each pedicle with the appropriate tap. We palpated with a ball probe to assure no break in the cortex. We then placed a 6.5 x 40 mm pedicle screws into the pedicles bilaterally at L4.  My nurse practitioner assisted in placement of the pedicle screws.  We then decorticated the transverse processes and laid a mixture of morcellized autograft and allograft out over these to perform intertransverse arthrodesis at L3-4 on the right. We then placed lordotic rods into the multiaxial screw heads of the pedicle screws and locked these in position with the locking caps and anti-torque device. We then checked our construct with AP and lateral fluoroscopy. Irrigated with copious amounts of bacitracin-containing saline solution. Inspected the nerve roots once again to assure adequate decompression, lined to the dura with Gelfoam,  and then we closed the muscle and the fascia with 0 Vicryl. Closed the subcutaneous tissues with 2-0 Vicryl and subcuticular tissues with 3-0 Vicryl. The skin was closed with benzoin and Steri-Strips. Dressing was then applied, the patient was awakened from general anesthesia and transported to the recovery room in stable condition. At the end of the procedure all sponge,  needle and instrument counts were correct.   PLAN OF CARE: admit to inpatient  PATIENT DISPOSITION:  PACU - hemodynamically stable.   Delay start of Pharmacological VTE agent (>24hrs) due to surgical blood loss or risk of bleeding:  yes

## 2020-11-20 DIAGNOSIS — M48061 Spinal stenosis, lumbar region without neurogenic claudication: Secondary | ICD-10-CM | POA: Diagnosis not present

## 2020-11-20 MED ORDER — METHOCARBAMOL 500 MG PO TABS: 500.0000 mg | ORAL_TABLET | Freq: Four times a day (QID) | ORAL | 1 refills | Status: DC | PRN

## 2020-11-20 MED ORDER — OXYCODONE HCL 10 MG PO TABS: 10.0000 mg | ORAL_TABLET | ORAL | 0 refills | Status: DC | PRN

## 2020-11-20 NOTE — Care Management Obs Status (Signed)
[facility] NOTIFICATION   Patient Details  Name: Jon Jackson MRN: [id number] Date of Birth: 31-Jan-1951   Medicare Observation Status Notification Given:  Yes    Angelita Ingles, RN 11/20/2020, 8:09 AM

## 2020-11-20 NOTE — Progress Notes (Signed)
Occupational Therapy Evaluation Patient Details Name: Jon Jackson MRN: [id number] DOB: Jan 04, 1951 Today's Date: 11/20/2020    History of Present Illness Patient is a 70 y.o. male admitted for back and leg pain, now s/p decompression and fusion L3-4 with interbody fusion 8/22. History includes smoking (23.5 pack years), GERD, HLD, A. Fib (DCCV 04/07/16; s/p ablation 07/22/16), skin cancer (BCC, SCC), spinal surgery (L3-4 laminectomy, foraminotomies 12/29/18).  12 standard alcoholic beverages per week is documented.  BMI is consistent with obesity.   Clinical Impression   Jon Jackson was evaluated s/p the above back surgery. PTA pt was indep in all ADL/IADLs. He lives with his wife who works during the day in a 2 level home with 5 STE. Upon evaluation pt verbalized understanding of all back precautions, and demonstrated great use of compensatory techniques. He completed all mobility with supervision and vc for technique. He currently requires up to min A for ADLs. Pt does not required further acute OT. Recommend d/c to home with supervision initially for Adls and mobility.     Follow Up Recommendations  No OT follow up;Supervision - Intermittent    Equipment Recommendations  3 in 1 bedside commode       Precautions / Restrictions Precautions Precautions: Back;Fall Precaution Booklet Issued: Yes (comment) Precaution Comments: Pt with good understanding of back precautions Required Braces or Orthoses: Spinal Brace Spinal Brace: Lumbar corset;Applied in sitting position Restrictions Weight Bearing Restrictions: No Other Position/Activity Restrictions: no liftin gmore than 10lbs      Mobility Bed Mobility Overal bed mobility: Needs Assistance Bed Mobility: Rolling;Sidelying to Sit Rolling: Supervision Sidelying to sit: Supervision       General bed mobility comments: vc for log roll technique    Transfers Overall transfer level: Needs assistance Equipment used: None Transfers: Sit  to/from Stand Sit to Stand: Supervision              Balance Overall balance assessment: Needs assistance Sitting-balance support: Feet supported Sitting balance-Leahy Scale: Good     Standing balance support: No upper extremity supported;During functional activity Standing balance-Leahy Scale: Fair                             ADL either performed or assessed with clinical judgement   ADL Overall ADL's : Needs assistance/impaired Eating/Feeding: Independent;Sitting   Grooming: Supervision/safety;Cueing for compensatory techniques;Cueing for safety;Standing Grooming Details (indicate cue type and reason): compensatory techniques for useing 2 cups Upper Body Bathing: Supervision/ safety;Cueing for compensatory techniques;Sitting   Lower Body Bathing: Minimal assistance;Adhering to back precautions;Cueing for compensatory techniques;Sit to/from stand   Upper Body Dressing : Set up;Sitting   Lower Body Dressing: Minimal assistance;Cueing for back precautions;Cueing for safety;Sit to/from stand   Toilet Transfer: Supervision/safety;Ambulation   Toileting- Clothing Manipulation and Hygiene: Supervision/safety;Sit to/from stand       Functional mobility during ADLs: Supervision/safety;Cueing for safety       Vision Baseline Vision/History: 1 Wears glasses Patient Visual Report: No change from baseline Vision Assessment?: No apparent visual deficits            Pertinent Vitals/Pain Pain Assessment: 0-10 Pain Score: 2  Pain Location: back, sx site Pain Descriptors / Indicators: Discomfort;Grimacing;Guarding Pain Intervention(s): Limited activity within patient's tolerance;Monitored during session     Hand Dominance Right   Extremity/Trunk Assessment Upper Extremity Assessment Upper Extremity Assessment: Overall WFL for tasks assessed   Lower Extremity Assessment Lower Extremity Assessment: Defer to PT evaluation   Cervical /  Trunk  Assessment Cervical / Trunk Assessment: Normal;Other exceptions Cervical / Trunk Exceptions: s/p back sx   Communication Communication Communication: No difficulties   Cognition Arousal/Alertness: Awake/alert Behavior During Therapy: WFL for tasks assessed/performed Overall Cognitive Status: Within Functional Limits for tasks assessed               General Comments  VSS on RA, no new concerns     Home Living Family/patient expects to be discharged to:: Private residence Living Arrangements: Spouse/significant other Available Help at Discharge: Available PRN/intermittently (wife works during the day) Type of Home: House Home Access: Stairs to enter [employer] of Steps: [place]: Two level;Able to live on main level with bedroom/bathroom     Bathroom Shower/Tub: Occupational psychologist: Standard     Home Equipment: Cane - single point          Prior Functioning/Environment Level of Independence: Independent                 OT Problem List: Decreased strength;Decreased range of motion;Decreased activity tolerance;Impaired balance (sitting and/or standing);Decreased safety awareness;Decreased knowledge of use of DME or AE;Decreased knowledge of precautions;Pain      OT Treatment/Interventions:      OT Goals(Current goals can be found in the care plan section) Acute Rehab OT Goals Patient Stated Goal: home soon      AM-PAC OT "6 Clicks" Daily Activity     Outcome Measure Help from another person eating meals?: None Help from another person taking care of personal grooming?: A Little Help from another person toileting, which includes using toliet, bedpan, or urinal?: A Little Help from another person bathing (including washing, rinsing, drying)?: A Little Help from another person to put on and taking off regular upper body clothing?: None Help from another person to put on and taking off regular lower body clothing?: A Little 6  Click Score: 20   End of Session Equipment Utilized During Treatment: Back brace Nurse Communication: Mobility status;Precautions  Activity Tolerance: Patient tolerated treatment well Patient left: in bed;with call bell/phone within reach (hand off to PT)  OT Visit Diagnosis: Unsteadiness on feet (R26.81);Other abnormalities of gait and mobility (R26.89);Muscle weakness (generalized) (M62.81);Repeated falls (R29.6);Pain                Time: [id number] OT Time Calculation (min): 17 min Charges:  OT General Charges $OT Visit: 1 Visit OT Evaluation $OT Eval Low Complexity: 1 Low    Selso Mannor A Larsen Dungan 11/20/2020, 8:39 AM

## 2020-11-20 NOTE — Anesthesia Postprocedure Evaluation (Signed)
Anesthesia Post Note  Patient: Jon Jackson  Procedure(s) Performed: Re-do Decompressive Laminectomy Lumbar Three-Four  with instrumented fusion (Back)     Patient location during evaluation: PACU Anesthesia Type: General Level of consciousness: awake and alert Pain management: pain level controlled Vital Signs Assessment: post-procedure vital signs reviewed and stable Respiratory status: spontaneous breathing, nonlabored ventilation and respiratory function stable Cardiovascular status: stable and blood pressure returned to baseline Anesthetic complications: no   No notable events documented.  Last Vitals:  Vitals:   11/20/20 0426 11/20/20 0728  BP: 125/77 132/73  Pulse: 85 80  Resp: 18 18  Temp: 36.7 C 36.7 C  SpO2: 93% 93%    Last Pain:  Vitals:   11/20/20 0728  TempSrc: Oral  PainSc: 3                  Audry Pili

## 2020-11-20 NOTE — Evaluation (Signed)
Physical Therapy Evaluation and Discharge Patient Details Name: Jon Jackson MRN: [id number] DOB: 30-Aug-1950 Today's Date: 11/20/2020   History of Present Illness  Patient is a 70 y.o. male admitted for back and leg pain, now s/p decompression and fusion L3-4 with interbody fusion 8/22. History includes smoking (23.5 pack years), GERD, HLD, A. Fib (DCCV 04/07/16; s/p ablation 07/22/16), skin cancer (BCC, SCC), spinal surgery (L3-4 laminectomy, foraminotomies 12/29/18).   Clinical Impression  Patient evaluated by Physical Therapy with no further acute PT needs identified. All education has been completed and the patient has no further questions. Pt was able to demonstrate transfers and ambulation with gross modified independence to supervision for safety without an AD. Pt was educated on precautions, brace application/wearing schedule, appropriate activity progression, and car transfer. See below for any follow-up Physical Therapy or equipment needs. PT is signing off. Thank you for this referral.     Follow Up Recommendations No PT follow up;Supervision for mobility/OOB    Equipment Recommendations  None recommended by PT    Recommendations for Other Services       Precautions / Restrictions Precautions Precautions: Back;Fall Precaution Booklet Issued: Yes (comment) Precaution Comments: Pt with good understanding of back precautions Required Braces or Orthoses: Spinal Brace Spinal Brace: Lumbar corset;Applied in sitting position Restrictions Weight Bearing Restrictions: No Other Position/Activity Restrictions: no liftin gmore than 10lbs      Mobility  Bed Mobility Overal bed mobility: Needs Assistance Bed Mobility: Rolling;Sidelying to Sit Rolling: Supervision Sidelying to sit: Supervision       General bed mobility comments: Pt was received sitting up EOB. Verbally reviewed log roll.    Transfers Overall transfer level: Modified independent Equipment used:  None Transfers: Sit to/from Stand Sit to Stand: Supervision         General transfer comment: No assist required. Min use of UE's to power-up to stand.  Ambulation/Gait Ambulation/Gait assistance: Supervision Gait Distance (Feet): 500 Feet Assistive device: None Gait Pattern/deviations: Step-through pattern;Decreased stride length;Trunk flexed Gait velocity: Decreased Gait velocity interpretation: 1.31 - 2.62 ft/sec, indicative of limited community ambulator General Gait Details: VC's for improved posture and forward gaze throughout. Pt was able to ambulate without overt LOB.  Stairs Stairs: Yes Stairs assistance: Supervision Stair Management: One rail Right;Step to pattern;Alternating pattern;Forwards Number of Stairs: 10 General stair comments: VC's for sequencing and general safety. One mild LOB when gait initiated however no assist required.  Wheelchair Mobility    Modified Rankin (Stroke Patients Only)       Balance Overall balance assessment: Needs assistance Sitting-balance support: Feet supported Sitting balance-Leahy Scale: Good     Standing balance support: No upper extremity supported;During functional activity Standing balance-Leahy Scale: Fair                               Pertinent Vitals/Pain Pain Assessment: 0-10 Pain Score: 5  Pain Location: Incision site Pain Descriptors / Indicators: Discomfort;Operative site guarding Pain Intervention(s): Limited activity within patient's tolerance;Monitored during session;Repositioned    Home Living Family/patient expects to be discharged to:: Private residence Living Arrangements: Spouse/significant other Available Help at Discharge: Available PRN/intermittently (wife works during the day) Type of Home: House Home Access: Stairs to enter Entrance Stairs-Rails: Left Entrance Stairs-Number of Steps: 5 Home Layout: Two level;Able to live on main level with bedroom/bathroom Home Equipment: Kasandra Knudsen  - single point      Prior Function Level of Independence: Independent  Hand Dominance   Dominant Hand: Right    Extremity/Trunk Assessment   Upper Extremity Assessment Upper Extremity Assessment: Defer to OT evaluation    Lower Extremity Assessment Lower Extremity Assessment: Generalized weakness;RLE deficits/detail RLE Deficits / Details: Decreased strength and muscular endurance consistent with pre-op diagnosis    Cervical / Trunk Assessment Cervical / Trunk Assessment: Other exceptions Cervical / Trunk Exceptions: s/p surgery  Communication   Communication: No difficulties  Cognition Arousal/Alertness: Awake/alert Behavior During Therapy: WFL for tasks assessed/performed Overall Cognitive Status: Within Functional Limits for tasks assessed                                        General Comments General comments (skin integrity, edema, etc.): VSS on RA, no new concerns    Exercises     Assessment/Plan    PT Assessment Patent does not need any further PT services  PT Problem List         PT Treatment Interventions      PT Goals (Current goals can be found in the Care Plan section)  Acute Rehab PT Goals Patient Stated Goal: Home today PT Goal Formulation: All assessment and education complete, DC therapy    Frequency     Barriers to discharge        Co-evaluation               AM-PAC PT "6 Clicks" Mobility  Outcome Measure Help needed turning from your back to your side while in a flat bed without using bedrails?: None Help needed moving from lying on your back to sitting on the side of a flat bed without using bedrails?: None Help needed moving to and from a bed to a chair (including a wheelchair)?: A Little Help needed standing up from a chair using your arms (e.g., wheelchair or bedside chair)?: A Little Help needed to walk in hospital room?: A Little Help needed climbing 3-5 steps with a railing? : A  Little 6 Click Score: 20    End of Session Equipment Utilized During Treatment: Gait belt;Back brace Activity Tolerance: Patient tolerated treatment well Patient left: in bed;with call bell/phone within reach (Sitting EOB) Nurse Communication: Mobility status PT Visit Diagnosis: Unsteadiness on feet (R26.81);Pain Pain - part of body:  (back)    Time: [id number] PT Time Calculation (min) (ACUTE ONLY): 14 min   Charges:   PT Evaluation $PT Eval Low Complexity: 1 Low          Rolinda Roan, PT, DPT Acute Rehabilitation Services Pager: [phone] Office: [phone]   Thelma Comp 11/20/2020, 9:46 AM

## 2020-11-20 NOTE — Discharge Summary (Signed)
Physician Discharge Summary  Patient ID: DAGIM TOUSIGNANT MRN: [id number] DOB/AGE: 1950/12/01 70 y.o.  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 The patient tolerated the procedure well and was taken to the recovery room and then to the floor in stable condition. The hospital course was routine. There were no complications. The wound remained clean dry and intact. Pt had appropriate back soreness. No complaints of leg pain or new N/T/W. The patient remained afebrile with stable vital signs, and tolerated a regular diet. The patient continued to increase activities, and pain was well controlled with oral pain medications.   Consults: None  Significant Diagnostic Studies:  Results for orders placed or performed in visit on 11/16/20  SARS Coronavirus 2 (TAT 6-24 hrs)  Result Value Ref Range   SARS Coronavirus 2 RESULT: NEGATIVE     DG Lumbar Spine 2-3 Views  Result Date: 11/19/2020 CLINICAL DATA:  Surgery. EXAM: DG C-ARM 1-60 MIN; LUMBAR SPINE - 2-3 VIEW FLUOROSCOPY TIME:  Fluoroscopy Time:  1 minutes and 6 seconds. Radiation Exposure Index (if provided by the fluoroscopic device): 51.16 mGy. Number of Acquired Spot Images: 3 COMPARISON:  September 06, 2020. FINDINGS: Three C-arm fluoroscopic images were obtained intraoperatively and submitted for post operative interpretation. These images demonstrate bilateral pedicle screws at L3 and L4 with intervening rods and intervening L3-L4 spacer. Please see the performing provider's procedural report for further detail. IMPRESSION: Intraoperative fluoroscopy, as detailed above. Electronically Signed   By: Margaretha Sheffield M.D.   On: 11/19/2020 18:05    DG C-Arm 1-60 Min  Result Date: 11/19/2020 CLINICAL DATA:  Surgery. EXAM: DG C-ARM 1-60 MIN; LUMBAR SPINE - 2-3 VIEW FLUOROSCOPY TIME:  Fluoroscopy Time:  1 minutes and 6 seconds. Radiation Exposure Index (if provided by the fluoroscopic device): 51.16 mGy. Number of Acquired Spot Images: 3 COMPARISON:  September 06, 2020. FINDINGS: Three C-arm fluoroscopic images were obtained intraoperatively and submitted for post operative interpretation. These images demonstrate bilateral pedicle screws at L3 and L4 with intervening rods and intervening L3-L4 spacer. Please see the performing provider's procedural report for further detail. IMPRESSION: Intraoperative fluoroscopy, as detailed above. Electronically Signed   By: Margaretha Sheffield M.D.   On: 11/19/2020 18:05    Antibiotics:  Anti-infectives (From admission, onward)    Start     Dose/Rate Route Frequency Ordered Stop   11/19/20 2300  ceFAZolin (ANCEF) IVPB 2g/100 mL premix        2 g 200 mL/hr over 30 Minutes Intravenous Every 8 hours 11/19/20 1902 11/20/20 [id number]   11/19/20 1200  ceFAZolin (ANCEF) IVPB 2g/100 mL premix        2 g 200 mL/hr over 30 Minutes Intravenous On call to O.R. 11/19/20 1149 11/19/20 1513       Discharge Exam: Blood pressure 132/73, pulse 80, temperature 98 F (36.7 C), temperature source Oral, resp. rate 18, height 5' 8.5" (1.74 m), weight 99.8 kg, SpO2 93 %. Neurologic: Grossly normal Dressing clean dry and intact  Discharge Medications:   Allergies as of 11/20/2020       Reactions   Tramadol Other (See Comments)   Head felt funny        Medication List     TAKE these medications  acetaminophen 500 MG tablet Commonly known as: TYLENOL Take 1 tablet (500 mg total) by mouth every 6 (six) hours as needed. What changed:  how much to take reasons to take this   ALLERGY EYE OP Place 1-2 drops into both eyes 3 (three) times daily as needed (for seasonal allergy/itchy eyes).   apixaban 5 MG Tabs  tablet Commonly known as: Eliquis Take 1 tablet (5 mg total) by mouth 2 (two) times daily.   citalopram 20 MG tablet Commonly known as: CELEXA Take 1 tablet (20 mg total) by mouth daily.   fluticasone 50 MCG/ACT nasal spray Commonly known as: FLONASE Place 1 spray into both nostrils daily as needed for allergies or rhinitis.   ibuprofen 200 MG tablet Commonly known as: ADVIL Take 400-600 mg by mouth every 6 (six) hours as needed for headache or moderate pain.   levocetirizine 5 MG tablet Commonly known as: XYZAL Take 5 mg by mouth daily.   Melatonin 10 MG Tabs Take 10 mg by mouth at bedtime as needed (sleep).   methocarbamol 500 MG tablet Commonly known as: ROBAXIN Take 1 tablet (500 mg total) by mouth every 6 (six) hours as needed for muscle spasms.   multivitamin tablet Take 1 tablet by mouth daily.   omeprazole 40 MG capsule Commonly known as: PRILOSEC Take 1 capsule by mouth once daily   Oxycodone HCl 10 MG Tabs Take 1 tablet (10 mg total) by mouth every 4 (four) hours as needed for severe pain ((score 7 to 10)).   simvastatin 20 MG tablet Commonly known as: ZOCOR Take 0.5 tablets (10 mg total) by mouth daily.               Durable Medical Equipment  (From admission, onward)           Start     Ordered   11/19/20 1902  DME Walker rolling  Once       Question:  Patient needs a walker to treat with the following condition  Answer:  S/P lumbar fusion   11/19/20 1902   11/19/20 1902  DME 3 n 1  Once        11/19/20 1902            Disposition: Home   Final Dx: PLIF L3-4  Discharge Instructions      Remove dressing in 72 hours   Complete by: As directed    Call MD for:  difficulty breathing, headache or visual disturbances   Complete by: As directed    Call MD for:  persistant nausea and vomiting   Complete by: As directed    Call MD for:  redness, tenderness, or signs of infection (pain, swelling, redness, odor or green/yellow discharge  around incision site)   Complete by: As directed    Call MD for:  severe uncontrolled pain   Complete by: As directed    Call MD for:  temperature >100.4   Complete by: As directed    Diet - low sodium heart healthy   Complete by: As directed    Discharge instructions   Complete by: As directed    Resume blood thinner on Friday   Increase activity slowly   Complete by: As directed         Follow-up Information     Eustace Moore, MD. Schedule an appointment as soon as possible for a visit in 2 week(s).   Specialty: Neurosurgery Contact information: [address]  [place]                  Signed: Eustace Moore 11/20/2020, 7:54 AM

## 2020-11-20 NOTE — Care Management CC44 (Signed)
Condition Code 44 Documentation Completed  Patient Details  Name: IRELAND VANSOMEREN MRN: [id number] Date of Birth: 26-Aug-1950   Condition Code 44 given:  Yes Patient signature on Condition Code 44 notice:  Yes Documentation of 2 MD's agreement:  Yes Code 44 added to claim:  Yes    Angelita Ingles, RN 11/20/2020, 8:10 AM

## 2020-11-20 NOTE — Progress Notes (Signed)
Patient was transported via wheelchair by volunteer for discharge home; in no acute distress nor complaints of pain nor discomfort; room was checked and accounted for all his belongings; discharge instructions given to patient by RN and he verbalized understanding on the instructions given.

## 2020-11-26 ENCOUNTER — Encounter (HOSPITAL_COMMUNITY): Payer: Self-pay | Admitting: Neurological Surgery

## 2020-11-29 ENCOUNTER — Encounter (HOSPITAL_COMMUNITY): Payer: Self-pay

## 2020-11-29 ENCOUNTER — Encounter (HOSPITAL_COMMUNITY): Payer: Self-pay | Admitting: Neurological Surgery

## 2020-11-29 NOTE — Progress Notes (Signed)
Attempted to reach patient regarding LCS. Unable to reach patient, detailed VM left asking that the patient return my call. °

## 2021-01-01 DIAGNOSIS — M4316 Spondylolisthesis, lumbar region: Secondary | ICD-10-CM | POA: Diagnosis not present

## 2021-01-07 DIAGNOSIS — L570 Actinic keratosis: Secondary | ICD-10-CM | POA: Diagnosis not present

## 2021-01-18 ENCOUNTER — Other Ambulatory Visit: Payer: Self-pay | Admitting: Family Medicine

## 2021-01-18 DIAGNOSIS — F320 Major depressive disorder, single episode, mild: Secondary | ICD-10-CM

## 2021-02-12 ENCOUNTER — Ambulatory Visit (INDEPENDENT_AMBULATORY_CARE_PROVIDER_SITE_OTHER): Payer: Medicare Other | Admitting: Family Medicine

## 2021-02-12 ENCOUNTER — Other Ambulatory Visit: Payer: Self-pay

## 2021-02-12 VITALS — BP 142/85 | Ht 70.5 in | Wt 225.6 lb

## 2021-02-12 DIAGNOSIS — K219 Gastro-esophageal reflux disease without esophagitis: Secondary | ICD-10-CM

## 2021-02-12 DIAGNOSIS — F320 Major depressive disorder, single episode, mild: Secondary | ICD-10-CM

## 2021-02-12 DIAGNOSIS — E7800 Pure hypercholesterolemia, unspecified: Secondary | ICD-10-CM | POA: Diagnosis not present

## 2021-02-12 DIAGNOSIS — Z7901 Long term (current) use of anticoagulants: Secondary | ICD-10-CM

## 2021-02-12 DIAGNOSIS — I10 Essential (primary) hypertension: Secondary | ICD-10-CM | POA: Diagnosis not present

## 2021-02-12 MED ORDER — OMEPRAZOLE 40 MG PO CPDR: 40.0000 mg | DELAYED_RELEASE_CAPSULE | Freq: Every day | ORAL | 3 refills | Status: DC

## 2021-02-12 MED ORDER — CITALOPRAM HYDROBROMIDE 20 MG PO TABS: 20.0000 mg | ORAL_TABLET | Freq: Every day | ORAL | 3 refills | Status: DC

## 2021-02-12 NOTE — Assessment & Plan Note (Signed)
Unsure of control. Lipid panel. Continue Zocor for now.

## 2021-02-12 NOTE — Assessment & Plan Note (Signed)
Stable. Celexa refilled today.

## 2021-02-12 NOTE — Assessment & Plan Note (Signed)
BP elevated. Will send in home readings to confirm continued elevation. Anticipate will need medication.

## 2021-02-12 NOTE — Progress Notes (Signed)
Subjective:  Patient ID: Jon Jackson, male    DOB: 09/07/1950  Age: 71 y.o. MRN: [id number]  CC: Chief Complaint  Patient presents with   Hyperlipidemia    Follow up Due for labs Establish care    HPI:  70 year old male with atrial fibrillation, hypertension, depression, hyperlipidemia, GERD presents for follow up.  Hypertension: Has HTN but is not on any medication a this time. BP elevated today. See below. Does not check BP at home.  Hyperlipidemia: Las lipid panel was in 2021. Unsure of control. States that he has recently restarted Simvastatin.   GERD: Stable on Omeprazole. Needs refill.   Depression:  Stable on Celexa. Needs refill.   Patient Active Problem List   Diagnosis Date Noted   Pure hypercholesterolemia 02/12/2021   S/P lumbar fusion 11/19/2020   Osteopenia 11/07/2020   Essential hypertension 11/18/2017   Persistent atrial fibrillation ([place])    Depression 04/08/2013   Allergic rhinitis 12/12/2012   GERD (gastroesophageal reflux disease) 06/21/2012    Social Hx   Social History   Socioeconomic History   Marital status: Married    Spouse name: Not on file   Number of children: Not on file   Years of education: Not on file   Highest education level: Not on file  Occupational History   Not on file  Tobacco Use   Smoking status: Some Days    Packs/day: 0.50    Years: 47.00    Pack years: 23.50    Types: Cigars, Cigarettes   Smokeless tobacco: Never  Vaping Use   Vaping Use: Never used  Substance and Sexual Activity   Alcohol use: Yes    Alcohol/week: 12.0 standard drinks    Types: 12 Cans of beer per week    Comment: daily 3-4 in the summer; soemtimes more on weekends.   Drug use: No   Sexual activity: Yes  Other Topics Concern   Not on file  Social History Narrative   Not on file   Social Determinants of Health   Financial Resource Strain: Low Risk    Difficulty of Paying Living Expenses: Not hard at all  Food Insecurity: No  Food Insecurity   Worried About Charity fundraiser in the Last Year: Never true   [place] in the Last Year: Never true  Transportation Needs: No Transportation Needs   Lack of Transportation (Medical): No   Lack of Transportation (Non-Medical): No  Physical Activity: Sufficiently Active   Days of Exercise per Week: 7 days   Minutes of Exercise per Session: 30 min  Stress: No Stress Concern Present   Feeling of Stress : Not at all  Social Connections: Moderately Isolated   Frequency of Communication with Friends and Family: More than three times a week   Frequency of Social Gatherings with Friends and Family: More than three times a week   Attends Religious Services: Never   Marine scientist or Organizations: No   Attends Archivist Meetings: Never   Marital Status: Married    Review of Systems  Constitutional: Negative.   Musculoskeletal:  Positive for back pain.       Doing well from recent back surgery.    Objective:  BP (!) 142/85   Ht 5' 10.5" (1.791 m)   Wt 225 lb 9.6 oz (102.3 kg)   BMI 31.91 kg/m   BP/Weight 02/12/2021 11/20/2020 9/76/7341  Systolic BP 937 902 -  Diastolic BP 85 73 -  Wt. (Lbs) 225.6 - 220  BMI 31.91 - 32.96    Physical Exam Vitals and nursing note reviewed.  Constitutional:      General: He is not in acute distress.    Appearance: Normal appearance. He is obese.  HENT:     Head: Normocephalic and atraumatic.  Eyes:     General:        Right eye: No discharge.        Left eye: No discharge.     Conjunctiva/sclera: Conjunctivae normal.  Cardiovascular:     Rate and Rhythm: Normal rate and regular rhythm.  Pulmonary:     Effort: Pulmonary effort is normal.     Breath sounds: Normal breath sounds.  Neurological:     Mental Status: He is alert.  Psychiatric:        Mood and Affect: Mood normal.        Behavior: Behavior normal.    Lab Results  Component Value Date   WBC 7.0 11/08/2020   HGB 15.1 11/08/2020    HCT 45.1 11/08/2020   PLT 216 11/08/2020   GLUCOSE 83 11/08/2020   CHOL 196 12/27/2019   TRIG 108 12/27/2019   HDL 47 12/27/2019   LDLCALC 129 (H) 12/27/2019   ALT 27 12/27/2019   AST 23 12/27/2019   NA 137 11/08/2020   K 4.4 11/08/2020   CL 106 11/08/2020   CREATININE 0.89 11/08/2020   BUN 13 11/08/2020   CO2 24 11/08/2020   TSH 1.520 02/24/2017   PSA 0.55 04/07/2014   INR 1.0 11/08/2020     Assessment & Plan:   Problem List Items Addressed This Visit       Cardiovascular and Mediastinum   Essential hypertension - Primary    BP elevated. Will send in home readings to confirm continued elevation. Anticipate will need medication.      Relevant Orders   CMP14+EGFR     Digestive   GERD (gastroesophageal reflux disease)    Stable. Continue Omeprazole. Refilled today.      Relevant Medications   omeprazole (PRILOSEC) 40 MG capsule     Other   Pure hypercholesterolemia    Unsure of control. Lipid panel. Continue Zocor for now.       Relevant Orders   Lipid panel   Depression    Stable. Celexa refilled today.      Relevant Medications   citalopram (CELEXA) 20 MG tablet   Other Visit Diagnoses     Chronic anticoagulation       Relevant Orders   CBC       Meds ordered this encounter  Medications   citalopram (CELEXA) 20 MG tablet    Sig: Take 1 tablet (20 mg total) by mouth daily.    Dispense:  90 tablet    Refill:  3   omeprazole (PRILOSEC) 40 MG capsule    Sig: Take 1 capsule (40 mg total) by mouth daily.    Dispense:  90 capsule    Refill:  3    Follow-up:  6 months; Advised to send me home BP readings.  [place]

## 2021-02-12 NOTE — Patient Instructions (Signed)
Fasting labs when you can.  I have refilled the requested medications.  Check BP readings for the next 1-2 weeks and send me the results.  Follow up in 6 months or sooner if needed.  Take care  Dr. Lacinda Axon

## 2021-02-12 NOTE — Assessment & Plan Note (Signed)
Stable. Continue Omeprazole. Refilled today.

## 2021-03-29 DIAGNOSIS — Z23 Encounter for immunization: Secondary | ICD-10-CM | POA: Diagnosis not present

## 2021-04-08 ENCOUNTER — Encounter (HOSPITAL_COMMUNITY): Payer: Self-pay

## 2021-04-08 NOTE — Progress Notes (Signed)
Attempted to reach patient regarding LCS. Unable to reach patient. Detailed VM left asking that the patient call my back. Referral closed at this time due to numerous unsuccessful attempts to reach patient.

## 2021-05-07 DIAGNOSIS — D485 Neoplasm of uncertain behavior of skin: Secondary | ICD-10-CM | POA: Diagnosis not present

## 2021-05-07 DIAGNOSIS — L820 Inflamed seborrheic keratosis: Secondary | ICD-10-CM | POA: Diagnosis not present

## 2021-06-20 DIAGNOSIS — Z20822 Contact with and (suspected) exposure to covid-19: Secondary | ICD-10-CM | POA: Diagnosis not present

## 2021-07-10 DIAGNOSIS — D0439 Carcinoma in situ of skin of other parts of face: Secondary | ICD-10-CM | POA: Diagnosis not present

## 2021-07-10 DIAGNOSIS — D485 Neoplasm of uncertain behavior of skin: Secondary | ICD-10-CM | POA: Diagnosis not present

## 2021-07-10 DIAGNOSIS — C44329 Squamous cell carcinoma of skin of other parts of face: Secondary | ICD-10-CM | POA: Diagnosis not present

## 2021-07-10 DIAGNOSIS — L570 Actinic keratosis: Secondary | ICD-10-CM | POA: Diagnosis not present

## 2021-07-15 ENCOUNTER — Ambulatory Visit (HOSPITAL_COMMUNITY)
Admission: RE | Admit: 2021-07-15 | Discharge: 2021-07-15 | Disposition: A | Discharge status: Home / Self Care | Payer: Medicare Other | Source: Ambulatory Visit | Attending: Family Medicine | Admitting: Family Medicine

## 2021-07-15 ENCOUNTER — Ambulatory Visit (INDEPENDENT_AMBULATORY_CARE_PROVIDER_SITE_OTHER): Payer: Medicare Other | Admitting: Family Medicine

## 2021-07-15 VITALS — BP 132/82 | Temp 97.0°F | Ht 70.5 in | Wt 222.0 lb

## 2021-07-15 DIAGNOSIS — R053 Chronic cough: Secondary | ICD-10-CM

## 2021-07-15 MED ORDER — AZELASTINE HCL 0.1 % NA SOLN: 2.0000 | Freq: Two times a day (BID) | NASAL | 12 refills | Status: DC

## 2021-07-15 MED ORDER — PROMETHAZINE-DM 6.25-15 MG/5ML PO SYRP: 5.0000 mL | ORAL_SOLUTION | Freq: Four times a day (QID) | ORAL | 0 refills | Status: DC | PRN

## 2021-07-15 NOTE — Patient Instructions (Signed)
Try the azelastine. ? ?Cough medication as directed. ? ?Chest xray today. ? ?Dr. Lacinda Axon  ?

## 2021-07-16 DIAGNOSIS — R053 Chronic cough: Secondary | ICD-10-CM | POA: Insufficient documentation

## 2021-07-16 NOTE — Assessment & Plan Note (Signed)
Chest x-ray done today.  Independently reviewed by me.  Scarring noted in the left base.  No other significant findings.  Lungs otherwise clear. ?Patient's cough is likely post-COVID.  Promethazine DM for cough.  Azelastine nasal spray as well (for postnasal drip). ?

## 2021-07-16 NOTE — Progress Notes (Signed)
? ?Subjective:  ?Patient ID: Jon Jackson, male    DOB: 1950/04/05  Age: 71 y.o. MRN: [id number] ? ?CC: ?Chief Complaint  ?Patient presents with  ? Cough  ?  Lingering for 3 months since having COVID  ? ? ?HPI: ? ?71 year old male presents for evaluation of cough. ? ?Patient reports that he has had an ongoing cough for the past 3 months.  Started after contracting COVID-19.  He is a smoker.  No fever.  No increasing shortness of breath.  Patient states that his wife wanted him to have a chest x-ray due to his age and persistent cough.  No relieving factors.  No other associated symptoms.  No other complaints. ? ?Patient Active Problem List  ? Diagnosis Date Noted  ? Chronic cough 07/16/2021  ? Pure hypercholesterolemia 02/12/2021  ? S/P lumbar fusion 11/19/2020  ? Osteopenia 11/07/2020  ? Essential hypertension 11/18/2017  ? Persistent atrial fibrillation ([place])   ? Depression 04/08/2013  ? Allergic rhinitis 12/12/2012  ? GERD (gastroesophageal reflux disease) 06/21/2012  ? ? ?Social Hx   ?Social History  ? ?Socioeconomic History  ? Marital status: Married  ?  Spouse name: Not on file  ? Number of children: Not on file  ? Years of education: Not on file  ? Highest education level: Not on file  ?Occupational History  ? Not on file  ?Tobacco Use  ? Smoking status: Some Days  ?  Packs/day: 0.50  ?  Years: 47.00  ?  Pack years: 23.50  ?  Types: Cigars, Cigarettes  ? Smokeless tobacco: Never  ?Vaping Use  ? Vaping Use: Never used  ?Substance and Sexual Activity  ? Alcohol use: Yes  ?  Alcohol/week: 12.0 standard drinks  ?  Types: 12 Cans of beer per week  ?  Comment: daily 3-4 in the summer; soemtimes more on weekends.  ? Drug use: No  ? Sexual activity: Yes  ?Other Topics Concern  ? Not on file  ?Social History Narrative  ? Not on file  ? ?Social Determinants of Health  ? ?Financial Resource Strain: Low Risk   ? Difficulty of Paying Living Expenses: Not hard at all  ?Food Insecurity: No Food Insecurity  ? Worried  About Charity fundraiser in the Last Year: Never true  ? Ran Out of Food in the Last Year: Never true  ?Transportation Needs: No Transportation Needs  ? Lack of Transportation (Medical): No  ? Lack of Transportation (Non-Medical): No  ?Physical Activity: Sufficiently Active  ? Days of Exercise per Week: 7 days  ? Minutes of Exercise per Session: 30 min  ?Stress: No Stress Concern Present  ? Feeling of Stress : Not at all  ?Social Connections: Moderately Isolated  ? Frequency of Communication with Friends and Family: More than three times a week  ? Frequency of Social Gatherings with Friends and Family: More than three times a week  ? Attends Religious Services: Never  ? Active Member of Clubs or Organizations: No  ? Attends Archivist Meetings: Never  ? Marital Status: Married  ? ? ?Review of Systems ?Per HPI ? ?Objective:  ?BP 132/82   Temp (!) 97 ?F (36.1 ?C) (Oral)   Ht 5' 10.5" (1.791 m)   Wt 222 lb (100.7 kg)   BMI 31.40 kg/m?  ? ? ?  07/15/2021  ?  3:39 PM 02/12/2021  ?  2:44 PM 11/20/2020  ?  7:28 AM  ?BP/Weight  ?Systolic BP 169 678  854  ?Diastolic BP 82 85 73  ?Wt. (Lbs) 222 225.6   ?BMI 31.4 kg/m2 31.91 kg/m2   ? ? ?Physical Exam ?Vitals and nursing note reviewed.  ?Constitutional:   ?   General: He is not in acute distress. ?   Appearance: Normal appearance. He is not ill-appearing.  ?HENT:  ?   Head: Normocephalic and atraumatic.  ?Eyes:  ?   Conjunctiva/sclera: Conjunctivae normal.  ?Cardiovascular:  ?   Rate and Rhythm: Normal rate and regular rhythm.  ?Pulmonary:  ?   Effort: Pulmonary effort is normal.  ?   Breath sounds: Normal breath sounds. No wheezing or rales.  ?Neurological:  ?   Mental Status: He is alert.  ?Psychiatric:     ?   Mood and Affect: Mood normal.     ?   Behavior: Behavior normal.  ? ? ?Lab Results  ?Component Value Date  ? WBC 7.0 11/08/2020  ? HGB 15.1 11/08/2020  ? HCT 45.1 11/08/2020  ? PLT 216 11/08/2020  ? GLUCOSE 83 11/08/2020  ? CHOL 196 12/27/2019  ? TRIG 108  12/27/2019  ? HDL 47 12/27/2019  ? LDLCALC 129 (H) 12/27/2019  ? ALT 27 12/27/2019  ? AST 23 12/27/2019  ? NA 137 11/08/2020  ? K 4.4 11/08/2020  ? CL 106 11/08/2020  ? CREATININE 0.89 11/08/2020  ? BUN 13 11/08/2020  ? CO2 24 11/08/2020  ? TSH 1.520 02/24/2017  ? PSA 0.55 04/07/2014  ? INR 1.0 11/08/2020  ? ? ? ?Assessment & Plan:  ? ?Problem List Items Addressed This Visit   ? ?  ? Other  ? Chronic cough - Primary  ?  Chest x-ray done today.  Independently reviewed by me.  Scarring noted in the left base.  No other significant findings.  Lungs otherwise clear. ?Patient's cough is likely post-COVID.  Promethazine DM for cough.  Azelastine nasal spray as well (for postnasal drip). ? ?  ?  ? Relevant Orders  ? DG Chest 2 View (Completed)  ? ? ?Meds ordered this encounter  ?Medications  ? azelastine (ASTELIN) 0.1 % nasal spray  ?  Sig: Place 2 sprays into both nostrils 2 (two) times daily. Use in each nostril as directed  ?  Dispense:  30 mL  ?  Refill:  12  ? promethazine-dextromethorphan (PROMETHAZINE-DM) 6.25-15 MG/5ML syrup  ?  Sig: Take 5 mLs by mouth 4 (four) times daily as needed.  ?  Dispense:  118 mL  ?  Refill:  0  ? ? ?Thersa Salt DO ?[place] ? ?

## 2021-07-25 ENCOUNTER — Other Ambulatory Visit: Payer: Self-pay | Admitting: Family Medicine

## 2021-07-25 DIAGNOSIS — D0430 Carcinoma in situ of skin of unspecified part of face: Secondary | ICD-10-CM | POA: Diagnosis not present

## 2021-07-26 DIAGNOSIS — R051 Acute cough: Secondary | ICD-10-CM | POA: Diagnosis not present

## 2021-07-26 DIAGNOSIS — Z20822 Contact with and (suspected) exposure to covid-19: Secondary | ICD-10-CM | POA: Diagnosis not present

## 2021-07-26 DIAGNOSIS — R059 Cough, unspecified: Secondary | ICD-10-CM | POA: Diagnosis not present

## 2021-07-28 DIAGNOSIS — Z20822 Contact with and (suspected) exposure to covid-19: Secondary | ICD-10-CM | POA: Diagnosis not present

## 2021-07-31 DIAGNOSIS — Z20822 Contact with and (suspected) exposure to covid-19: Secondary | ICD-10-CM | POA: Diagnosis not present

## 2021-08-14 ENCOUNTER — Other Ambulatory Visit: Payer: Self-pay | Admitting: Family Medicine

## 2021-08-14 ENCOUNTER — Telehealth: Payer: Self-pay | Admitting: *Deleted

## 2021-08-14 MED ORDER — PREDNISONE 10 MG PO TABS: ORAL_TABLET | ORAL | 0 refills | Status: DC

## 2021-08-14 NOTE — Telephone Encounter (Signed)
Patient notified

## 2021-08-14 NOTE — Telephone Encounter (Signed)
Patient was seen on 07/15/21 for a cough. He said he was prescribed some cough medicine but it hasn't helped.  He would like to have a steroid to be sent in.  He says he has coughed so much that his stomach is hurting.  Please advise. Thank you ?

## 2021-08-14 NOTE — Telephone Encounter (Signed)
LMTRC

## 2021-09-11 ENCOUNTER — Telehealth: Payer: Self-pay | Admitting: Family Medicine

## 2021-09-11 NOTE — Telephone Encounter (Signed)
Patient requested to be called back in August 2023 for AWV. Stated the family has a family member that is seriously ill at this time.

## 2021-09-18 ENCOUNTER — Ambulatory Visit (INDEPENDENT_AMBULATORY_CARE_PROVIDER_SITE_OTHER): Payer: Medicare Other | Admitting: Family Medicine

## 2021-09-18 DIAGNOSIS — J988 Other specified respiratory disorders: Secondary | ICD-10-CM | POA: Diagnosis not present

## 2021-09-18 MED ORDER — DOXYCYCLINE HYCLATE 100 MG PO TABS: 100.0000 mg | ORAL_TABLET | Freq: Two times a day (BID) | ORAL | 0 refills | Status: DC

## 2021-09-18 NOTE — Progress Notes (Signed)
Subjective:  Patient ID: Jon Jackson, male    DOB: 04/01/1950  Age: 71 y.o. MRN: [id number]  CC: Chief Complaint  Patient presents with   sinus drainage    With congestion and hx of staph infection in nose post back surgery last year   Cough    HPI:  71 year old male presents for evaluation the above.  Patient seen by me in April for cough.  Chest x-ray was negative at that time.  He was treated with medication and Atrovent nasal spray.  He failed to improve and was subsequently treated with prednisone.  Patient states that he had improvement.  However he has now had recurrence of his symptoms.  He reports dry cough.  He also reports sinus congestion and "drainage".  He states that the drainage has a foul taste to it.  No fever.  No relieving factors.  No other complaints.  Patient Active Problem List   Diagnosis Date Noted   Respiratory infection 09/18/2021   Pure hypercholesterolemia 02/12/2021   S/P lumbar fusion 11/19/2020   Osteopenia 11/07/2020   Essential hypertension 11/18/2017   Persistent atrial fibrillation ([place])    Depression 04/08/2013   Allergic rhinitis 12/12/2012   GERD (gastroesophageal reflux disease) 06/21/2012    Social Hx   Social History   Socioeconomic History   Marital status: Married    Spouse name: Not on file   Number of children: Not on file   Years of education: Not on file   Highest education level: Not on file  Occupational History   Not on file  Tobacco Use   Smoking status: Some Days    Packs/day: 0.50    Years: 47.00    Total pack years: 23.50    Types: Cigars, Cigarettes   Smokeless tobacco: Never  Vaping Use   Vaping Use: Never used  Substance and Sexual Activity   Alcohol use: Yes    Alcohol/week: 12.0 standard drinks of alcohol    Types: 12 Cans of beer per week    Comment: daily 3-4 in the summer; soemtimes more on weekends.   Drug use: No   Sexual activity: Yes  Other Topics Concern   Not on file  Social History  Narrative   Not on file   Social Determinants of Health   Financial Resource Strain: Low Risk  (09/11/2020)   Overall Financial Resource Strain (CARDIA)    Difficulty of Paying Living Expenses: Not hard at all  Food Insecurity: No Food Insecurity (09/11/2020)   Hunger Vital Sign    Worried About Running Out of Food in the Last Year: Never true    Ran Out of Food in the Last Year: Never true  Transportation Needs: No Transportation Needs (09/11/2020)   PRAPARE - Hydrologist (Medical): No    Lack of Transportation (Non-Medical): No  Physical Activity: Sufficiently Active (09/11/2020)   Exercise Vital Sign    Days of Exercise per Week: 7 days    Minutes of Exercise per Session: 30 min  Stress: No Stress Concern Present (09/11/2020)   [place]    Feeling of Stress : Not at all  Social Connections: Moderately Isolated (09/11/2020)   Social Connection and Isolation Panel [NHANES]    Frequency of Communication with Friends and Family: More than three times a week    Frequency of Social Gatherings with Friends and Family: More than three times a week    Attends  Religious Services: Never    Active Member of Clubs or Organizations: No    Attends Archivist Meetings: Never    Marital Status: Married    Review of Systems Per HPI  Objective:  BP (!) 168/78   Pulse 62   Temp (!) 97.5 F (36.4 C)   Ht 5' 10.5" (1.791 m)   Wt 220 lb (99.8 kg)   SpO2 97%   BMI 31.12 kg/m      09/18/2021   11:57 AM 07/15/2021    3:39 PM 02/12/2021    2:44 PM  BP/Weight  Systolic BP 810 175 102  Diastolic BP 78 82 85  Wt. (Lbs) 220 222 225.6  BMI 31.12 kg/m2 31.4 kg/m2 31.91 kg/m2    Physical Exam Vitals and nursing note reviewed.  Constitutional:      General: He is not in acute distress.    Appearance: Normal appearance.  HENT:     Head: Normocephalic and atraumatic.     Right Ear:  Tympanic membrane normal.     Left Ear: Tympanic membrane normal.     Nose: Congestion present.     Mouth/Throat:     Pharynx: Oropharynx is clear.  Eyes:     General:        Right eye: No discharge.        Left eye: No discharge.     Conjunctiva/sclera: Conjunctivae normal.  Cardiovascular:     Rate and Rhythm: Normal rate and regular rhythm.     Heart sounds: Murmur heard.  Pulmonary:     Effort: Pulmonary effort is normal.     Breath sounds: Normal breath sounds. No wheezing or rhonchi.  Neurological:     Mental Status: He is alert.     Lab Results  Component Value Date   WBC 7.0 11/08/2020   HGB 15.1 11/08/2020   HCT 45.1 11/08/2020   PLT 216 11/08/2020   GLUCOSE 83 11/08/2020   CHOL 196 12/27/2019   TRIG 108 12/27/2019   HDL 47 12/27/2019   LDLCALC 129 (H) 12/27/2019   ALT 27 12/27/2019   AST 23 12/27/2019   NA 137 11/08/2020   K 4.4 11/08/2020   CL 106 11/08/2020   CREATININE 0.89 11/08/2020   BUN 13 11/08/2020   CO2 24 11/08/2020   TSH 1.520 02/24/2017   PSA 0.55 04/07/2014   INR 1.0 11/08/2020     Assessment & Plan:   Problem List Items Addressed This Visit       Respiratory   Respiratory infection    Treating with doxycycline.       Meds ordered this encounter  Medications   doxycycline (VIBRA-TABS) 100 MG tablet    Sig: Take 1 tablet (100 mg total) by mouth 2 (two) times daily.    Dispense:  14 tablet    Refill:  [place]

## 2021-09-18 NOTE — Patient Instructions (Signed)
Antibiotic as prescribed.  Call with concerns.  Take care  Dr. Amoria Mclees  

## 2021-09-18 NOTE — Assessment & Plan Note (Signed)
Treating with doxycycline. 

## 2021-11-12 ENCOUNTER — Other Ambulatory Visit: Payer: Self-pay | Admitting: *Deleted

## 2021-11-12 NOTE — Patient Outreach (Signed)
  Care Coordination   11/12/2021 Name: Jon Jackson MRN: [id number] DOB: January 22, 1951   Care Coordination Outreach Attempts:  An unsuccessful telephone outreach was attempted today to offer the patient information about available care coordination services as a benefit of their health plan.   Follow Up Plan:  Additional outreach attempts will be made to offer the patient care coordination information and services.   Encounter Outcome:  No Answer  Care Coordination Interventions Activated:  Yes   Care Coordination Interventions:  No, not indicated    Beryl Balz C. Myrtie Neither, MSN, [facility] Gerontological Nurse Practitioner [facility] Care Management [phone]

## 2021-11-28 ENCOUNTER — Other Ambulatory Visit: Payer: Self-pay | Admitting: *Deleted

## 2021-11-28 NOTE — Patient Outreach (Signed)
  Care Coordination   11/28/2021 Name: Jon Jackson MRN: [id number] DOB: [birth year]   Care Coordination Outreach Attempts:  A second unsuccessful outreach was attempted today to offer the patient with information about available care coordination services as a benefit of their health plan.     Follow Up Plan:  Additional outreach attempts will be made to offer the patient care coordination information and services.   Encounter Outcome:  No Answer  Care Coordination Interventions Activated:  No   Care Coordination Interventions:  No, not indicated    SIG Dreshawn Hendershott C. Myrtie Neither, MSN, [facility] Gerontological Nurse Practitioner [facility] Care Management [phone]

## 2021-12-04 ENCOUNTER — Telehealth: Payer: Self-pay | Admitting: *Deleted

## 2021-12-04 NOTE — Patient Outreach (Signed)
  Care Coordination   12/04/2021 Name: Jon Jackson MRN: [id number] DOB: [birth year]   Care Coordination Outreach Attempts:  A third unsuccessful outreach was attempted today to offer the patient with information about available care coordination services as a benefit of their health plan.   Follow Up Plan:  No further outreach attempts will be made at this time. We have been unable to contact the patient to offer or enroll patient in care coordination services  Encounter Outcome:  No Answer  Care Coordination Interventions Activated:  No   Care Coordination Interventions:  No, not indicated    SIG Jamisha Hoeschen C. Myrtie Neither, MSN, [facility] Gerontological Nurse Practitioner [facility] Care Management [phone]

## 2022-01-01 ENCOUNTER — Ambulatory Visit: Payer: Medicare Other | Attending: Cardiology | Admitting: Cardiology

## 2022-01-01 ENCOUNTER — Encounter: Payer: Self-pay | Admitting: Cardiology

## 2022-01-01 VITALS — BP 144/77 | HR 82 | Ht 70.0 in | Wt 220.2 lb

## 2022-01-01 DIAGNOSIS — I4819 Other persistent atrial fibrillation: Secondary | ICD-10-CM

## 2022-01-01 NOTE — Patient Instructions (Signed)
Medication Instructions:  Your physician has recommended you make the following change in your medication:  1) STOP taking Eliquis *If you need a refill on your cardiac medications before your next appointment, please call your pharmacy*  Follow-Up: At [facility], you and your health needs are our priority.  As part of our continuing mission to provide you with exceptional heart care, we have created designated Provider Care Teams.  These Care Teams include your primary Cardiologist (physician) and Advanced Practice Providers (APPs -  Physician Assistants and Nurse Practitioners) who all work together to provide you with the care you need, when you need it.  Your next appointment:   1 year(s)  The format for your next appointment:   In Person  Provider:   Legrand Como "Jonni Sanger" Chalmers Cater, PA-C   Important Information About Sugar

## 2022-01-01 NOTE — Progress Notes (Signed)
Electrophysiology Office Note   Date:  01/01/2022   ID:  BROWN DUNLAP, DOB 1950/12/10, MRN [id number]  PCP:  Coral Spikes, DO  Cardiologist:  [name] Primary Electrophysiologist:  Jacalyn Biggs Meredith Leeds, MD    No chief complaint on file.    History of Present Illness: Jon Jackson is a 71 y.o. male who presents today for electrophysiology evaluation.   Jon Jackson is a 71 y.o. male who is being seen today for the evaluation of atrial fibrillation at the request of Coral Spikes, DO.   He has a history significant for persistent atrial fibrillation.  He is status post ablation 07/22/2016.  Today, denies symptoms of palpitations, chest pain, shortness of breath, orthopnea, PND, lower extremity edema, claudication, dizziness, presyncope, syncope, bleeding, or neurologic sequela. The patient is tolerating medications without difficulties.  Being seen he has done well.  He has noted no further episodes of atrial fibrillation.  He overall feels well.   Past Medical History:  Diagnosis Date   Anxiety    Arthritis    Atrial fibrillation (Jon Jackson)    Basal cell carcinoma    Depression    Dysrhythmia    GERD (gastroesophageal reflux disease)    Hyperlipidemia    Squamous carcinoma    Past Surgical History:  Procedure Laterality Date   ATRIAL FIBRILLATION ABLATION  07/22/2016   ATRIAL FIBRILLATION ABLATION N/A 07/22/2016   Procedure: Atrial Fibrillation Ablation;  Surgeon: Tyshay Adee Meredith Leeds, MD;  Location: [place] CV [facility];  Service: Cardiovascular;  Laterality: N/A;   BASAL CELL CARCINOMA EXCISION     forehead; top of scalp   BIOPSY  05/27/2017   Procedure: BIOPSY;  Surgeon: [name], MD;  [facility];  Service: Endoscopy;;  right colon, left colon;   CARDIOVERSION N/A 04/07/2016   Procedure: CARDIOVERSION;  Surgeon: Satira Sark, MD;  Location: [facility];  Service: Cardiovascular;  Laterality: N/A;   COLECTOMY  1990s   removal of section due to  strangulated bowel.   COLONOSCOPY N/A 05/30/2013   Procedure: COLONOSCOPY;  Surgeon: [name], MD;  [facility];  Service: Endoscopy;  Laterality: N/A;  8:45 AM   COLONOSCOPY     COLONOSCOPY N/A 05/27/2017   Procedure: COLONOSCOPY;  Surgeon: [name], MD;  [facility]: [facility];  Service: Endoscopy;  Laterality: N/A;  1:00pm   INGUINAL HERNIA REPAIR Right 1990s   LUMBAR LAMINECTOMY/DECOMPRESSION MICRODISCECTOMY N/A 12/29/2018   Procedure: Decompressive lumbar laminectomy at L3-L4;  Surgeon: Latanya Maudlin, MD;  Location: [facility];  Service: Orthopedics;  Laterality: N/A;  158mn   SHOULDER ARTHROSCOPY WITH ROTATOR CUFF REPAIR AND SUBACROMIAL DECOMPRESSION Left 05/15/2016   Procedure: LEFT SHOULDER ARTHROSCOPY WITH ROTATOR CUFF REPAIR AND SUBACROMIAL DECOMPRESSION AND DISTAL CLAVICLE RESECTION;  Surgeon: KJustice Britain MD;  Location: [facility]  Service: Orthopedics;  Laterality: Left;  requests 2hrs   SHOULDER SURGERY Right    SQUAMOUS CELL CARCINOMA EXCISION     scalp   TONSILLECTOMY       Current Outpatient Medications  Medication Sig Dispense Refill   acetaminophen (TYLENOL) 500 MG tablet Take 1 tablet (500 mg total) by mouth every 6 (six) hours as needed. 30 tablet 0   azelastine (ASTELIN) 0.1 % nasal spray Place 2 sprays into both nostrils 2 (two) times daily. Use in each nostril as directed 30 mL 12   citalopram (CELEXA) 20 MG tablet Take 1 tablet (20 mg total) by mouth daily. 9[place]  tablet 3   levocetirizine (XYZAL) 5 MG tablet Take 5 mg by mouth daily.     Melatonin 10 MG TABS Take 10 mg by mouth at bedtime as needed (sleep).     Multiple Vitamin (MULTIVITAMIN) tablet Take 1 tablet by mouth daily.     Naphazoline-Pheniramine (ALLERGY EYE OP) Place 1-2 drops into both eyes 3 (three) times daily as needed (for seasonal allergy/itchy eyes).     omeprazole (PRILOSEC) 40 MG capsule Take 1 capsule (40 mg total) by mouth daily. 90 capsule 3   simvastatin (ZOCOR) 20 MG tablet  Take 0.5 tablets (10 mg total) by mouth daily. 90 tablet 1   No current facility-administered medications for this visit.    Allergies:   Tramadol   Social History:  The patient  reports that he has been smoking cigars and cigarettes. He has a 23.50 pack-year smoking history. He has never used smokeless tobacco. He reports current alcohol use of about 12.0 standard drinks of alcohol per week. He reports that he does not use drugs.   Family History:  The patient's family history includes Breast cancer in his mother; Colon cancer (age of onset: 52) in his brother; Throat cancer in his brother.   ROS:  Please see the history of present illness.   Otherwise, review of systems is positive for none.   All other systems are reviewed and negative.   PHYSICAL EXAM: VS:  BP (!) 144/77   Pulse 82   Ht '5\' 10"'$  (1.778 m)   Wt 220 lb 3.2 oz (99.9 kg)   SpO2 98%   BMI 31.60 kg/m  , BMI Body mass index is 31.6 kg/m. GEN: Well nourished, well developed, in no acute distress  HEENT: normal  Neck: no JVD, carotid bruits, or masses Cardiac: RRR; no murmurs, rubs, or gallops,no edema  Respiratory:  clear to auscultation bilaterally, normal work of breathing GI: soft, nontender, nondistended, + BS MS: no deformity or atrophy  Skin: warm and dry Neuro:  Strength and sensation are intact Psych: euthymic mood, full affect  EKG:  EKG is ordered today. Personal review of the ekg ordered shows sinus rhythm, rate 62  Recent Labs: No results found for requested labs within last 365 days.    Lipid Panel     Component Value Date/Time   CHOL 196 12/27/2019 1043   TRIG 108 12/27/2019 1043   HDL 47 12/27/2019 1043   CHOLHDL 4.2 12/27/2019 1043   CHOLHDL 3.1 11/18/2014 0846   VLDL 14 11/18/2014 0846   LDLCALC 129 (H) 12/27/2019 1043     Wt Readings from Last 3 Encounters:  01/01/22 220 lb 3.2 oz (99.9 kg)  09/18/21 220 lb (99.8 kg)  07/15/21 222 lb (100.7 kg)      Other studies  Reviewed: Additional studies/ records that were reviewed today include: TTE 03/04/16  Review of the above records today demonstrates:  - Left ventricle: The cavity size was normal. Wall thickness was   increased in a pattern of mild LVH. The estimated ejection   fraction was 55%. Wall motion was normal; there were no regional   wall motion abnormalities. The study is not technically   sufficient to allow evaluation of LV diastolic function. - Aortic valve: Trileaflet; moderately calcified leaflets.   Noncoronary cusp mobility was severely restricted. There was mild   stenosis. Aortic valve area approximately 1.5-1.7 cm^2 by   planimetry. - Aortic root: The aortic root was mildly ectatic. - Mitral valve: Calcified annulus. There was  trivial regurgitation. - Left atrium: The atrium was mildly dilated. - Right atrium: The atrium was mildly to moderately dilated. - Atrial septum: No defect or patent foramen ovale was identified. - Tricuspid valve: There was trivial regurgitation. - Pulmonary arteries: Systolic pressure could not be accurately   estimated. - Pericardium, extracardiac: There was no pericardial effusion.   ASSESSMENT AND PLAN:  1.  Persistent atrial fibrillation: Currently on Eliquis 5 mg twice daily.  Status post ablation 07/22/2016.  CHA2DS2-VASc of 3.  Remained in sinus rhythm.  He has had no further episodes of atrial fibrillation.  Overall quite happy with his control.  As it has been many years since his ablation, we Johnisha Louks stop his Eliquis.  2.  Hypertension: Mildly elevated but usually well controlled.  No changes.  3.  Hyperlipidemia: Continue simvastatin per primary physician  4.  Mild aortic stenosis: Found on echo in 2021.  Asymptomatic.  We Elli Groesbeck continue to monitor.  Current medicines are reviewed at length with the patient today.   The patient does not have concerns regarding his medicines.  The following changes were made today: none  Labs/ tests ordered today  include:  Orders Placed This Encounter  Procedures   EKG 12-Lead     Disposition:   FU 12 months  Signed, Anjela Cassara Meredith Leeds, MD  01/01/2022 3:01 PM     [place] [address] [place] [address] [phone] (office) [phone] (fax)

## 2022-01-10 ENCOUNTER — Other Ambulatory Visit: Payer: Self-pay | Admitting: Family Medicine

## 2022-01-10 DIAGNOSIS — F320 Major depressive disorder, single episode, mild: Secondary | ICD-10-CM

## 2022-01-12 ENCOUNTER — Other Ambulatory Visit: Payer: Self-pay | Admitting: Family Medicine

## 2022-01-15 DIAGNOSIS — D485 Neoplasm of uncertain behavior of skin: Secondary | ICD-10-CM | POA: Diagnosis not present

## 2022-01-15 DIAGNOSIS — L570 Actinic keratosis: Secondary | ICD-10-CM | POA: Diagnosis not present

## 2022-01-15 DIAGNOSIS — L820 Inflamed seborrheic keratosis: Secondary | ICD-10-CM | POA: Diagnosis not present

## 2022-02-28 DIAGNOSIS — J189 Pneumonia, unspecified organism: Secondary | ICD-10-CM

## 2022-02-28 HISTORY — DX: Pneumonia, unspecified organism: J18.9

## 2022-03-13 DIAGNOSIS — Z23 Encounter for immunization: Secondary | ICD-10-CM | POA: Diagnosis not present

## 2022-04-02 DIAGNOSIS — J189 Pneumonia, unspecified organism: Secondary | ICD-10-CM | POA: Diagnosis not present

## 2022-04-26 ENCOUNTER — Other Ambulatory Visit: Payer: Self-pay | Admitting: Family Medicine

## 2022-04-26 DIAGNOSIS — F320 Major depressive disorder, single episode, mild: Secondary | ICD-10-CM

## 2022-04-28 NOTE — Telephone Encounter (Signed)
Sent my chart message to schedule appointment 04/28/22

## 2022-05-08 NOTE — Patient Instructions (Addendum)
Jon Jackson , Thank you for taking time to come for your Medicare Wellness Visit. I appreciate your ongoing commitment to your health goals. Please review the following plan we discussed and let me know if I can assist you in the future.   These are the goals we discussed:  Goals      Patient Stated     I would like to have my back surgery so that my back could feel better         This is a list of the screening recommended for you and due dates:  Health Maintenance  Topic Date Due   COVID-19 Vaccine (4 - 2023-24 season) 05/25/2022*   Zoster (Shingles) Vaccine (1 of 2) 08/07/2022*   Screening for Lung Cancer  09/28/2022*   Hepatitis C Screening: USPSTF Recommendation to screen - Ages 18-79 yo.  09/28/2022*   Medicare Annual Wellness Visit  05/10/2023   Colon Cancer Screening  05/28/2027   Pneumonia Vaccine  Completed   Flu Shot  Completed   HPV Vaccine  Aged Out   DTaP/Tdap/Td vaccine  Discontinued  *Topic was postponed. The date shown is not the original due date.    Advanced directives: Please bring a copy of your health care power of attorney and living will to the office to be added to your chart at your convenience.   Conditions/risks identified: Aim for 30 minutes of exercise or brisk walking, 6-8 glasses of water, and 5 servings of fruits and vegetables each day.  Next appointment: Follow up in one year for your annual wellness visit.   Happy Birthday!!!!!!  Preventive Care 47 Years and Older, Male  Preventive care refers to lifestyle choices and visits with your health care provider that can promote health and wellness. What does preventive care include? A yearly physical exam. This is also called an annual well check. Dental exams once or twice a year. Routine eye exams. Ask your health care provider how often you should have your eyes checked. Personal lifestyle choices, including: Daily care of your teeth and gums. Regular physical activity. Eating a healthy  diet. Avoiding tobacco and drug use. Limiting alcohol use. Practicing safe sex. Taking low doses of aspirin every day. Taking vitamin and mineral supplements as recommended by your health care provider. What happens during an annual well check? The services and screenings done by your health care provider during your annual well check will depend on your age, overall health, lifestyle risk factors, and family history of disease. Counseling  Your health care provider may ask you questions about your: Alcohol use. Tobacco use. Drug use. Emotional well-being. Home and relationship well-being. Sexual activity. Eating habits. History of falls. Memory and ability to understand (cognition). Work and work Statistician. Screening  You may have the following tests or measurements: Height, weight, and BMI. Blood pressure. Lipid and cholesterol levels. These may be checked every 5 years, or more frequently if you are over 46 years old. Skin check. Lung cancer screening. You may have this screening every year starting at age 38 if you have a 30-pack-year history of smoking and currently smoke or have quit within the past 15 years. Fecal occult blood test (FOBT) of the stool. You may have this test every year starting at age 66. Flexible sigmoidoscopy or colonoscopy. You may have a sigmoidoscopy every 5 years or a colonoscopy every 10 years starting at age 82. Prostate cancer screening. Recommendations will vary depending on your family history and other risks. Hepatitis C blood  test. Hepatitis B blood test. Sexually transmitted disease (STD) testing. Diabetes screening. This is done by checking your blood sugar (glucose) after you have not eaten for a while (fasting). You may have this done every 1-3 years. Abdominal aortic aneurysm (AAA) screening. You may need this if you are a current or former smoker. Osteoporosis. You may be screened starting at age 87 if you are at high risk. Talk with  your health care provider about your test results, treatment options, and if necessary, the need for more tests. Vaccines  Your health care provider may recommend certain vaccines, such as: Influenza vaccine. This is recommended every year. Tetanus, diphtheria, and acellular pertussis (Tdap, Td) vaccine. You may need a Td booster every 10 years. Zoster vaccine. You may need this after age 55. Pneumococcal 13-valent conjugate (PCV13) vaccine. One dose is recommended after age 49. Pneumococcal polysaccharide (PPSV23) vaccine. One dose is recommended after age 72. Talk to your health care provider about which screenings and vaccines you need and how often you need them. This information is not intended to replace advice given to you by your health care provider. Make sure you discuss any questions you have with your health care provider. Document Released: 04/13/2015 Document Revised: 12/05/2015 Document Reviewed: 01/16/2015 Elsevier Interactive Patient Education  2017 [place] Prevention in the Home Falls can cause injuries. They can happen to people of all ages. There are many things you can do to make your home safe and to help prevent falls. What can I do on the outside of my home? Regularly fix the edges of walkways and driveways and fix any cracks. Remove anything that might make you trip as you walk through a door, such as a raised step or threshold. Trim any bushes or trees on the path to your home. Use bright outdoor lighting. Clear any walking paths of anything that might make someone trip, such as rocks or tools. Regularly check to see if handrails are loose or broken. Make sure that both sides of any steps have handrails. Any raised decks and porches should have guardrails on the edges. Have any leaves, snow, or ice cleared regularly. Use sand or salt on walking paths during winter. Clean up any spills in your garage right away. This includes oil or grease spills. What  can I do in the bathroom? Use night lights. Install grab bars by the toilet and in the tub and shower. Do not use towel bars as grab bars. Use non-skid mats or decals in the tub or shower. If you need to sit down in the shower, use a plastic, non-slip stool. Keep the floor dry. Clean up any water that spills on the floor as soon as it happens. Remove soap buildup in the tub or shower regularly. Attach bath mats securely with double-sided non-slip rug tape. Do not have throw rugs and other things on the floor that can make you trip. What can I do in the bedroom? Use night lights. Make sure that you have a light by your bed that is easy to reach. Do not use any sheets or blankets that are too big for your bed. They should not hang down onto the floor. Have a firm chair that has side arms. You can use this for support while you get dressed. Do not have throw rugs and other things on the floor that can make you trip. What can I do in the kitchen? Clean up any spills right away. Avoid walking on wet  floors. Keep items that you use a lot in easy-to-reach places. If you need to reach something above you, use a strong step stool that has a grab bar. Keep electrical cords out of the way. Do not use floor polish or wax that makes floors slippery. If you must use wax, use non-skid floor wax. Do not have throw rugs and other things on the floor that can make you trip. What can I do with my stairs? Do not leave any items on the stairs. Make sure that there are handrails on both sides of the stairs and use them. Fix handrails that are broken or loose. Make sure that handrails are as long as the stairways. Check any carpeting to make sure that it is firmly attached to the stairs. Fix any carpet that is loose or worn. Avoid having throw rugs at the top or bottom of the stairs. If you do have throw rugs, attach them to the floor with carpet tape. Make sure that you have a light switch at the top of the  stairs and the bottom of the stairs. If you do not have them, ask someone to add them for you. What else can I do to help prevent falls? Wear shoes that: Do not have high heels. Have rubber bottoms. Are comfortable and fit you well. Are closed at the toe. Do not wear sandals. If you use a stepladder: Make sure that it is fully opened. Do not climb a closed stepladder. Make sure that both sides of the stepladder are locked into place. Ask someone to hold it for you, if possible. Clearly mark and make sure that you can see: Any grab bars or handrails. First and last steps. Where the edge of each step is. Use tools that help you move around (mobility aids) if they are needed. These include: Canes. Walkers. Scooters. Crutches. Turn on the lights when you go into a dark area. Replace any light bulbs as soon as they burn out. Set up your furniture so you have a clear path. Avoid moving your furniture around. If any of your floors are uneven, fix them. If there are any pets around you, be aware of where they are. Review your medicines with your doctor. Some medicines can make you feel dizzy. This can increase your chance of falling. Ask your doctor what other things that you can do to help prevent falls. This information is not intended to replace advice given to you by your health care provider. Make sure you discuss any questions you have with your health care provider. Document Released: 01/11/2009 Document Revised: 08/23/2015 Document Reviewed: 04/21/2014 Elsevier Interactive Patient Education  2017 [employer].

## 2022-05-08 NOTE — Telephone Encounter (Signed)
Sent message to schedule medication follow up 05/08/22

## 2022-05-08 NOTE — Progress Notes (Signed)
Subjective:   Jon Jackson is a 72 y.o. male who presents for Medicare Annual/Subsequent preventive examination.  Review of Systems     Cardiac Risk Factors include: smoking/ tobacco exposure;advanced age (>65mn, >>31women);male gender;dyslipidemia     Objective:    Today's Vitals   05/09/22 0915  BP: 130/64  Weight: 213 lb (96.6 kg)  Height: 5' 10.5" (1.791 m)   Body mass index is 30.13 kg/m.     05/09/2022    9:53 AM 11/08/2020    8:28 AM 09/11/2020    9:53 AM 12/29/2018    2:00 PM 12/24/2018    8:24 AM 11/29/2018    9:10 AM 05/27/2017   12:02 PM  Advanced Directives  Does Patient Have a Medical Advance Directive? No No No No No No No  Would patient like information on creating a medical advance directive? Yes (MAU/Ambulatory/Procedural Areas - Information given) Yes (MAU/Ambulatory/Procedural Areas - Information given) No - Patient declined No - Patient declined No - Patient declined  No - Patient declined    Current Medications (verified) Outpatient Encounter Medications as of 05/09/2022  Medication Sig   citalopram (CELEXA) 20 MG tablet Take 1 tablet by mouth once daily   Melatonin 10 MG TABS Take 10 mg by mouth at bedtime as needed (sleep).   Multiple Vitamin (MULTIVITAMIN) tablet Take 1 tablet by mouth daily.   omeprazole (PRILOSEC) 40 MG capsule Take 1 capsule by mouth once daily   simvastatin (ZOCOR) 20 MG tablet Take 0.5 tablets (10 mg total) by mouth daily.   acetaminophen (TYLENOL) 500 MG tablet Take 1 tablet (500 mg total) by mouth every 6 (six) hours as needed.   azelastine (ASTELIN) 0.1 % nasal spray Place 2 sprays into both nostrils 2 (two) times daily. Use in each nostril as directed   levocetirizine (XYZAL) 5 MG tablet Take 5 mg by mouth daily.   Naphazoline-Pheniramine (ALLERGY EYE OP) Place 1-2 drops into both eyes 3 (three) times daily as needed (for seasonal allergy/itchy eyes).   No facility-administered encounter medications on file as of 05/09/2022.     Allergies (verified) Tramadol   History: Past Medical History:  Diagnosis Date   Anxiety    Arthritis    Atrial fibrillation (HCC)    Basal cell carcinoma    Depression    Dysrhythmia    GERD (gastroesophageal reflux disease)    Hyperlipidemia    Squamous carcinoma    Past Surgical History:  Procedure Laterality Date   ATRIAL FIBRILLATION ABLATION  07/22/2016   ATRIAL FIBRILLATION ABLATION N/A 07/22/2016   Procedure: Atrial Fibrillation Ablation;  Surgeon: Will MMeredith Leeds MD;  [facility];  Service: Cardiovascular;  Laterality: N/A;   BASAL CELL CARCINOMA EXCISION     forehead; top of scalp   BIOPSY  05/27/2017   Procedure: BIOPSY;  Surgeon: RDaneil Dolin MD;  [facility];  Service: Endoscopy;;  right colon, left colon;   CARDIOVERSION N/A 04/07/2016   Procedure: CARDIOVERSION;  Surgeon: SSatira Sark MD;  Location: [facility];  Service: Cardiovascular;  Laterality: N/A;   COLECTOMY  1990s   removal of section due to strangulated bowel.   COLONOSCOPY N/A 05/30/2013   Procedure: COLONOSCOPY;  Surgeon: RDaneil Dolin MD;  [facility];  Service: Endoscopy;  Laterality: N/A;  8:45 AM   COLONOSCOPY     COLONOSCOPY N/A 05/27/2017   Procedure: COLONOSCOPY;  Surgeon: RDaneil Dolin MD;  [facility];  Service: Endoscopy;  Laterality: N/A;  1:00pm   INGUINAL HERNIA REPAIR Right 1990s   LUMBAR LAMINECTOMY/DECOMPRESSION MICRODISCECTOMY N/A 12/29/2018   Procedure: Decompressive lumbar laminectomy at L3-L4;  Surgeon: Latanya Maudlin, MD;  Location: [facility];  Service: Orthopedics;  Laterality: N/A;  140mn   SHOULDER ARTHROSCOPY WITH ROTATOR CUFF REPAIR AND SUBACROMIAL DECOMPRESSION Left 05/15/2016   Procedure: LEFT SHOULDER ARTHROSCOPY WITH ROTATOR CUFF REPAIR AND SUBACROMIAL DECOMPRESSION AND DISTAL CLAVICLE RESECTION;  Surgeon: KJustice Britain MD;  [facility]  Service: Orthopedics;  Laterality: Left;  requests 2hrs   SHOULDER  SURGERY Right    SQUAMOUS CELL CARCINOMA EXCISION     scalp   TONSILLECTOMY     Family History  Problem Relation Age of Onset   Colon cancer Brother 662  Throat cancer Brother    Breast cancer Mother    Social History   Socioeconomic History   Marital status: Married    Spouse name: Not on file   Number of children: Not on file   Years of education: Not on file   Highest education level: Not on file  Occupational History   Not on file  Tobacco Use   Smoking status: Some Days    Packs/day: 0.50    Years: 47.00    Total pack years: 23.50    Types: Cigars, Cigarettes   Smokeless tobacco: Never  Vaping Use   Vaping Use: Never used  Substance and Sexual Activity   Alcohol use: Yes    Alcohol/week: 12.0 standard drinks of alcohol    Types: 12 Cans of beer per week    Comment: daily 3-4 in the summer; soemtimes more on weekends.   Drug use: No   Sexual activity: Yes  Other Topics Concern   Not on file  Social History Narrative   Not on file   Social Determinants of Health   Financial Resource Strain: Low Risk  (05/09/2022)   Overall Financial Resource Strain (CARDIA)    Difficulty of Paying Living Expenses: Not hard at all  Food Insecurity: No Food Insecurity (05/09/2022)   Hunger Vital Sign    Worried About Running Out of Food in the Last Year: Never true    Ran Out of Food in the Last Year: Never true  Transportation Needs: No Transportation Needs (05/09/2022)   PRAPARE - THydrologist(Medical): No    Lack of Transportation (Non-Medical): No  Physical Activity: Sufficiently Active (05/09/2022)   Exercise Vital Sign    Days of Exercise per Week: 7 days    Minutes of Exercise per Session: 30 min  Stress: No Stress Concern Present (05/09/2022)   [place]   Feeling of Stress : Not at all  Social Connections: Moderately Isolated (05/09/2022)   Social Connection and Isolation Panel  [NHANES]    Frequency of Communication with Friends and Family: More than three times a week    Frequency of Social Gatherings with Friends and Family: More than three times a week    Attends Religious Services: Never    AMarine scientistor Organizations: No    Attends CMusic therapist Never    Marital Status: Married    Tobacco Counseling Ready to quit: Not Answered Counseling given: Not Answered   Clinical Intake:  Pre-visit preparation completed: Yes  Pain : No/denies pain  Diabetes: No  How often do you need to have someone help you when you read  instructions, pamphlets, or other written materials from your doctor or pharmacy?: 1 - Never  Diabetic?No   Interpreter Needed?: No  Information entered by :: Denman George LPN   Activities of Daily Living    05/09/2022    9:53 AM  In your present state of health, do you have any difficulty performing the following activities:  Hearing? 0  Vision? 0  Difficulty concentrating or making decisions? 0  Walking or climbing stairs? 0  Dressing or bathing? 0  Doing errands, shopping? 0  Preparing Food and eating ? N  Using the Toilet? N  In the past six months, have you accidently leaked urine? N  Do you have problems with loss of bowel control? N  Managing your Medications? N  Managing your Finances? N  Housekeeping or managing your Housekeeping? N    Patient Care Team: Coral Spikes, DO as PCP - General (Family Medicine) Constance Haw, MD as PCP - Electrophysiology (Cardiology) Sandford Craze, MD as Referring Physician (Dermatology) Eustace Moore, MD as Consulting Physician (Neurosurgery)  Indicate any recent Medical Services you may have received from other than [facility] providers in the past year (date may be approximate).     Assessment:   This is a routine wellness examination for [place].  Hearing/Vision screen Hearing Screening - Comments:: Denies hearing difficulties   Vision  Screening - Comments:: Wears rx glasses - up to date with routine eye exams with [place] issues and exercise activities discussed: Current Exercise Habits: Home exercise routine, Type of exercise: walking, Time (Minutes): 30, Frequency (Times/Week): 7, Weekly Exercise (Minutes/Week): 210, Intensity: Mild   Goals Addressed   None    Depression Screen    05/09/2022    9:51 AM 02/12/2021    3:41 PM 09/11/2020    9:56 AM 12/27/2019   10:22 AM 05/24/2018    9:32 AM 11/18/2017    8:13 AM 11/10/2016    5:03 PM  PHQ 2/9 Scores  PHQ - 2 Score 0 0 0 0 0 0 2  PHQ- 9 Score  1  1 3 1 4    $ Fall Risk    05/09/2022    9:45 AM 02/12/2021    2:46 PM 09/11/2020    9:54 AM 12/27/2019   10:01 AM 05/24/2018    8:26 AM  Fall Risk   Falls in the past year? 0 0 1 0 0  Number falls in past yr: 0 0 0    Comment   slipped on ice    Injury with Fall? 0  1    Comment   Broke a rib when he fell and slipped on ice    Risk for fall due to :  No Fall Risks No Fall Risks    Follow up Falls prevention discussed;Education provided;Falls evaluation completed Falls evaluation completed Falls evaluation completed;Falls prevention discussed Falls evaluation completed     FALL RISK PREVENTION PERTAINING TO THE HOME:  Any stairs in or around the home? Yes  If so, are there any without handrails? No  Home free of loose throw rugs in walkways, pet beds, electrical cords, etc? Yes  Adequate lighting in your home to reduce risk of falls? Yes   ASSISTIVE DEVICES UTILIZED TO PREVENT FALLS:  Life alert? No  Use of a cane, walker or w/c? No  Grab bars in the bathroom? Yes  Shower chair or bench in shower? No  Elevated toilet seat or a handicapped toilet? Yes  TIMED UP AND GO:  Was the test performed? Yes . completed and within normal timeframe; no gait abnormalities noted    Cognitive Function:        05/09/2022    9:54 AM  6CIT Screen  What Year? 0 points  What month? 0 points   What time? 0 points  Count back from 20 0 points  Months in reverse 0 points  Repeat phrase 0 points  Total Score 0 points    Immunizations Immunization History  Administered Date(s) Administered   Fluad Quad(high Dose 65+) 12/27/2019, 03/29/2021, 03/13/2022   Influenza Split 02/02/2013   Influenza, High Dose Seasonal PF 03/08/2018   Influenza,inj,Quad PF,6+ Mos 02/12/2016   Influenza-Unspecified 02/20/2015, 02/23/2017, 03/08/2018, 03/04/2019   Moderna Sars-Covid-2 Vaccination 05/30/2019, 06/28/2019, 05/07/2020   Pneumococcal Conjugate-13 05/14/2017   Pneumococcal Polysaccharide-23 02/12/2016, 03/04/2019    TDAP status: Due, Education has been provided regarding the importance of this vaccine. Advised may receive this vaccine at local pharmacy or Health Dept. Aware to provide a copy of the vaccination record if obtained from local pharmacy or Health Dept. Verbalized acceptance and understanding.  Flu Vaccine status: Up to date  Pneumococcal vaccine status: Up to date  Covid-19 vaccine status: Information provided on how to obtain vaccines.   Qualifies for Shingles Vaccine? Yes   Zostavax completed No   Shingrix Completed?: No.    Education has been provided regarding the importance of this vaccine. Patient has been advised to call insurance company to determine out of pocket expense if they have not yet received this vaccine. Advised may also receive vaccine at local pharmacy or Health Dept. Verbalized acceptance and understanding.  Screening Tests Health Maintenance  Topic Date Due   COVID-19 Vaccine (4 - 2023-24 season) 05/25/2022 (Originally 11/29/2021)   Zoster Vaccines- Shingrix (1 of 2) 08/07/2022 (Originally 05/14/1969)   Lung Cancer Screening  09/28/2022 (Originally 05/14/2000)   Hepatitis C Screening  09/28/2022 (Originally [birth year]1968)   Medicare Annual Wellness (AWV)  05/10/2023   COLONOSCOPY (Pts 45-76yr Insurance coverage will need to be confirmed)  05/28/2027    Pneumonia Vaccine 72 Years old  Completed   INFLUENZA VACCINE  Completed   HPV VACCINES  Aged Out   DTaP/Tdap/Td  Discontinued    Health Maintenance  There are no preventive care reminders to display for this patient.   Colorectal cancer screening: Type of screening: Colonoscopy. Completed 05/27/17. Repeat every 10 years  Lung Cancer Screening: (Low Dose CT Chest recommended if Age 72-80years, 30 pack-year currently smoking OR have quit w/in 15years.) does qualify.   Lung Cancer Screening Referral: will discuss with provider   Additional Screening:  Hepatitis C Screening: does qualify; Completed at next office visit   Vision Screening: Recommended annual ophthalmology exams for early detection of glaucoma and other disorders of the eye. Is the patient up to date with their annual eye exam?  Yes  Who is the provider or what is the name of the [facility] which the patient [facility] If pt is not established with a provider, would they like to be referred to a provider to establish care? No .   Dental Screening: Recommended annual dental exams for proper oral hygiene  Community Resource Referral / Chronic Care Management: CRR required this visit?  No   CCM required this visit?  No      Plan:     I have personally reviewed and noted the following in the patient's chart:  Medical and social history Use of alcohol, tobacco or illicit drugs  Current medications and supplements including opioid prescriptions. Patient is not currently taking opioid prescriptions. Functional ability and status Nutritional status Physical activity Advanced directives List of other physicians Hospitalizations, surgeries, and ER visits in previous 12 months Vitals Screenings to include cognitive, depression, and falls Referrals and appointments  In addition, I have reviewed and discussed with patient certain preventive protocols, quality metrics,  and best practice recommendations. A written personalized care plan for preventive services as well as general preventive health recommendations were provided to patient.     Denman George [place], [place]   [id number]   Nurse Notes: No concerns; patient is requesting refills on his medications. Will route as a refill request.

## 2022-05-09 ENCOUNTER — Ambulatory Visit (INDEPENDENT_AMBULATORY_CARE_PROVIDER_SITE_OTHER): Payer: Medicare Other

## 2022-05-09 VITALS — BP 130/64 | Ht 70.5 in | Wt 213.0 lb

## 2022-05-09 DIAGNOSIS — Z0000 Encounter for general adult medical examination without abnormal findings: Secondary | ICD-10-CM | POA: Diagnosis not present

## 2022-05-20 ENCOUNTER — Other Ambulatory Visit: Payer: Self-pay | Admitting: *Deleted

## 2022-05-20 DIAGNOSIS — F320 Major depressive disorder, single episode, mild: Secondary | ICD-10-CM

## 2022-05-20 DIAGNOSIS — E785 Hyperlipidemia, unspecified: Secondary | ICD-10-CM

## 2022-05-20 MED ORDER — SIMVASTATIN 20 MG PO TABS: 10.0000 mg | ORAL_TABLET | Freq: Every day | ORAL | 0 refills | Status: DC

## 2022-05-20 MED ORDER — OMEPRAZOLE 40 MG PO CPDR: 40.0000 mg | DELAYED_RELEASE_CAPSULE | Freq: Every day | ORAL | 0 refills | Status: DC

## 2022-05-20 MED ORDER — CITALOPRAM HYDROBROMIDE 20 MG PO TABS: 20.0000 mg | ORAL_TABLET | Freq: Every day | ORAL | 0 refills | Status: DC

## 2022-06-06 NOTE — Telephone Encounter (Signed)
Sent two request to schedule appointment but no response.

## 2022-07-10 ENCOUNTER — Encounter: Payer: Self-pay | Admitting: Family Medicine

## 2022-07-10 ENCOUNTER — Ambulatory Visit (INDEPENDENT_AMBULATORY_CARE_PROVIDER_SITE_OTHER): Payer: Medicare Other | Admitting: Family Medicine

## 2022-07-10 DIAGNOSIS — K219 Gastro-esophageal reflux disease without esophagitis: Secondary | ICD-10-CM

## 2022-07-10 DIAGNOSIS — Z1159 Encounter for screening for other viral diseases: Secondary | ICD-10-CM

## 2022-07-10 DIAGNOSIS — E785 Hyperlipidemia, unspecified: Secondary | ICD-10-CM

## 2022-07-10 DIAGNOSIS — I4891 Unspecified atrial fibrillation: Secondary | ICD-10-CM

## 2022-07-10 DIAGNOSIS — F320 Major depressive disorder, single episode, mild: Secondary | ICD-10-CM | POA: Diagnosis not present

## 2022-07-10 DIAGNOSIS — I10 Essential (primary) hypertension: Secondary | ICD-10-CM | POA: Diagnosis not present

## 2022-07-10 DIAGNOSIS — F17200 Nicotine dependence, unspecified, uncomplicated: Secondary | ICD-10-CM | POA: Diagnosis not present

## 2022-07-10 DIAGNOSIS — I350 Nonrheumatic aortic (valve) stenosis: Secondary | ICD-10-CM | POA: Insufficient documentation

## 2022-07-10 MED ORDER — OMEPRAZOLE 40 MG PO CPDR: 40.0000 mg | DELAYED_RELEASE_CAPSULE | Freq: Every day | ORAL | 1 refills | Status: DC

## 2022-07-10 MED ORDER — SIMVASTATIN 20 MG PO TABS: 10.0000 mg | ORAL_TABLET | Freq: Every day | ORAL | 1 refills | Status: DC

## 2022-07-10 MED ORDER — CITALOPRAM HYDROBROMIDE 20 MG PO TABS: 20.0000 mg | ORAL_TABLET | Freq: Every day | ORAL | 1 refills | Status: DC

## 2022-07-10 NOTE — Patient Instructions (Addendum)
Labs today.  Continue your current medication.  Follow up in 3 months. Check BP @ home. Goal <140/90.  Take care  Dr. Adriana Simas

## 2022-07-10 NOTE — Assessment & Plan Note (Signed)
Unsure of control.  Will continue simvastatin for now.  Awaiting lipid panel results.

## 2022-07-10 NOTE — Assessment & Plan Note (Signed)
BP elevated today and on repeat.  Advised to monitor his blood pressures at home.  Goal at least less than 140/90.  Patient will follow-up in 3 months.  Labs today.

## 2022-07-10 NOTE — Assessment & Plan Note (Signed)
Stable on Celexa.  Continue.  Refilled today. ?

## 2022-07-10 NOTE — Assessment & Plan Note (Signed)
Stable to omeprazole.  Continue.  Refilled today.

## 2022-07-10 NOTE — Progress Notes (Signed)
Subjective:  Patient ID: Jon Jackson, male    DOB: 18-Jun-1950  Age: 72 y.o. MRN: [id number]  CC: Chief Complaint  Patient presents with   Hyperlipidemia    Follow up   Gastroesophageal Reflux    Follow up    HPI:  72 year old male with a history of atrial fibrillation status post ablation, hypertension, GERD, hyperlipidemia, depression presents for follow-up.  Patient states that overall he is doing well.  Cardiology has discontinued Eliquis.  He is in sinus rhythm.  Blood pressure elevated here today and on repeat.  He is on no pharmacotherapy regarding this.  He does not check his blood pressures at home.  Patient is in need of labs.  No recent labs in the chart.  Last available labs were in 2022.  Unsure the status of his lipids currently.  He is currently on simvastatin 20 mg daily.  Depression stable on Lexapro.  GERD stable on omeprazole.  Patient is in need of refills.   Patient Active Problem List   Diagnosis Date Noted   Hyperlipidemia 07/10/2022   Aortic stenosis 07/10/2022   S/P lumbar fusion 11/19/2020   Osteopenia 11/07/2020   Essential hypertension 11/18/2017   Atrial fibrillation    Depression 04/08/2013   Allergic rhinitis 12/12/2012   GERD (gastroesophageal reflux disease) 06/21/2012    Social Hx   Social History   Socioeconomic History   Marital status: Married    Spouse name: Not on file   Number of children: Not on file   Years of education: Not on file   Highest education level: Not on file  Occupational History   Not on file  Tobacco Use   Smoking status: Some Days    Packs/day: 0.50    Years: 47.00    Additional pack years: 0.00    Total pack years: 23.50    Types: Cigars, Cigarettes   Smokeless tobacco: Never  Vaping Use   Vaping Use: Never used  Substance and Sexual Activity   Alcohol use: Yes    Alcohol/week: 12.0 standard drinks of alcohol    Types: 12 Cans of beer per week    Comment: daily 3-4 in the summer;  soemtimes more on weekends.   Drug use: No   Sexual activity: Yes  Other Topics Concern   Not on file  Social History Narrative   Not on file   Social Determinants of Health   Financial Resource Strain: Low Risk  (05/09/2022)   Overall Financial Resource Strain (CARDIA)    Difficulty of Paying Living Expenses: Not hard at all  Food Insecurity: No Food Insecurity (05/09/2022)   Hunger Vital Sign    Worried About Running Out of Food in the Last Year: Never true    Ran Out of Food in the Last Year: Never true  Transportation Needs: No Transportation Needs (05/09/2022)   PRAPARE - Administrator, Civil Service (Medical): No    Lack of Transportation (Non-Medical): No  Physical Activity: Sufficiently Active (05/09/2022)   Exercise Vital Sign    Days of Exercise per Week: 7 days    Minutes of Exercise per Session: 30 min  Stress: No Stress Concern Present (05/09/2022)   [employer] of Occupational Health - Occupational Stress Questionnaire    Feeling of Stress : Not at all  Social Connections: Moderately Isolated (05/09/2022)   Social Connection and Isolation Panel [NHANES]    Frequency of Communication with Friends and Family: More than three times a week  Frequency of Social Gatherings with Friends and Family: More than three times a week    Attends Religious Services: Never    Database administratorActive Member of Clubs or Organizations: No    Attends Engineer, structuralClub or Organization Meetings: Never    Marital Status: Married    Review of Systems Per HPI  Objective:  BP (!) 153/78   Pulse 62   Temp 98.6 F (37 C)   Ht 5' 10.5" (1.791 m)   Wt 214 lb (97.1 kg)   SpO2 96%   BMI 30.27 kg/m      07/10/2022   11:14 AM 07/10/2022   10:44 AM 05/09/2022    9:15 AM  BP/Weight  Systolic BP 153 154 130  Diastolic BP 78 68 64  Wt. (Lbs)  214 213  BMI  30.27 kg/m2 30.13 kg/m2    Physical Exam Constitutional:      General: He is not in acute distress.    Appearance: Normal appearance.  HENT:      Head: Normocephalic and atraumatic.  Eyes:     General:        Right eye: No discharge.        Left eye: No discharge.     Conjunctiva/sclera: Conjunctivae normal.  Cardiovascular:     Rate and Rhythm: Normal rate and regular rhythm.     Heart sounds: Murmur heard.  Pulmonary:     Effort: Pulmonary effort is normal.     Breath sounds: Normal breath sounds. No wheezing, rhonchi or rales.  Neurological:     Mental Status: He is alert.  Psychiatric:        Mood and Affect: Mood normal.        Behavior: Behavior normal.     Lab Results  Component Value Date   WBC 7.0 11/08/2020   HGB 15.1 11/08/2020   HCT 45.1 11/08/2020   PLT 216 11/08/2020   GLUCOSE 83 11/08/2020   CHOL 196 12/27/2019   TRIG 108 12/27/2019   HDL 47 12/27/2019   LDLCALC 129 (H) 12/27/2019   ALT 27 12/27/2019   AST 23 12/27/2019   NA 137 11/08/2020   K 4.4 11/08/2020   CL 106 11/08/2020   CREATININE 0.89 11/08/2020   BUN 13 11/08/2020   CO2 24 11/08/2020   TSH 1.520 02/24/2017   PSA 0.55 04/07/2014   INR 1.0 11/08/2020     Assessment & Plan:   Problem List Items Addressed This Visit       Cardiovascular and Mediastinum   Essential hypertension    BP elevated today and on repeat.  Advised to monitor his blood pressures at home.  Goal at least less than 140/90.  Patient will follow-up in 3 months.  Labs today.      Relevant Medications   simvastatin (ZOCOR) 20 MG tablet   Other Relevant Orders   CMP14+EGFR   Atrial fibrillation   Relevant Medications   simvastatin (ZOCOR) 20 MG tablet   Other Relevant Orders   CBC     Digestive   GERD (gastroesophageal reflux disease)    Stable to omeprazole.  Continue.  Refilled today.      Relevant Medications   omeprazole (PRILOSEC) 40 MG capsule     Other   Hyperlipidemia    Unsure of control.  Will continue simvastatin for now.  Awaiting lipid panel results.      Relevant Medications   simvastatin (ZOCOR) 20 MG tablet   Other Relevant  Orders   Lipid panel  Depression    Stable on Celexa.  Continue.  Refilled today.      Relevant Medications   citalopram (CELEXA) 20 MG tablet   Other Visit Diagnoses     Smoker       Relevant Orders   Ambulatory Referral Lung Cancer Screening [facility]   Encounter for hepatitis C screening test for low risk patient       Relevant Orders   Hepatitis C Antibody       Meds ordered this encounter  Medications   citalopram (CELEXA) 20 MG tablet    Sig: Take 1 tablet (20 mg total) by mouth daily.    Dispense:  90 tablet    Refill:  1   omeprazole (PRILOSEC) 40 MG capsule    Sig: Take 1 capsule (40 mg total) by mouth daily.    Dispense:  90 capsule    Refill:  1   simvastatin (ZOCOR) 20 MG tablet    Sig: Take 0.5 tablets (10 mg total) by mouth daily.    Dispense:  90 tablet    Refill:  1    Follow-up:  Return in about 3 months (around 10/09/2022) for HTN follow up.  Everlene Other DO [facility]

## 2022-07-11 LAB — CBC
Hematocrit: 47.4 % (ref 37.5–51.0)
Hemoglobin: 15.5 g/dL (ref 13.0–17.7)
MCH: 31.3 pg (ref 26.6–33.0)
MCHC: 32.7 g/dL (ref 31.5–35.7)
MCV: 96 fL (ref 79–97)
Platelets: 230 10*3/uL (ref 150–450)
RBC: 4.96 x10E6/uL (ref 4.14–5.80)
RDW: 12.1 % (ref 11.6–15.4)
WBC: 6 10*3/uL (ref 3.4–10.8)

## 2022-07-11 LAB — CMP14+EGFR
ALT: 26 IU/L (ref 0–44)
AST: 18 IU/L (ref 0–40)
Albumin/Globulin Ratio: 2 (ref 1.2–2.2)
Albumin: 4.4 g/dL (ref 3.8–4.8)
Alkaline Phosphatase: 111 IU/L (ref 44–121)
BUN/Creatinine Ratio: 15 (ref 10–24)
BUN: 13 mg/dL (ref 8–27)
Bilirubin Total: <0.2 mg/dL (ref 0.0–1.2)
CO2: 24 mmol/L (ref 20–29)
Calcium: 9.4 mg/dL (ref 8.6–10.2)
Chloride: 103 mmol/L (ref 96–106)
Creatinine, Ser: 0.89 mg/dL (ref 0.76–1.27)
Globulin, Total: 2.2 g/dL (ref 1.5–4.5)
Glucose: 81 mg/dL (ref 70–99)
Potassium: 4.8 mmol/L (ref 3.5–5.2)
Sodium: 141 mmol/L (ref 134–144)
Total Protein: 6.6 g/dL (ref 6.0–8.5)
eGFR: 91 mL/min/{1.73_m2} (ref >59)

## 2022-07-11 LAB — LIPID PANEL
Chol/HDL Ratio: 3.8 ratio (ref 0.0–5.0)
Cholesterol, Total: 192 mg/dL (ref 100–199)
HDL: 50 mg/dL (ref >39)
LDL Chol Calc (NIH): 126 mg/dL — ABNORMAL HIGH (ref 0–99)
Triglycerides: 90 mg/dL (ref 0–149)
VLDL Cholesterol Cal: 16 mg/dL (ref 5–40)

## 2022-07-11 LAB — HEPATITIS C ANTIBODY: Hep C Virus Ab: NONREACTIVE

## 2022-07-21 DIAGNOSIS — D239 Other benign neoplasm of skin, unspecified: Secondary | ICD-10-CM | POA: Diagnosis not present

## 2022-07-21 DIAGNOSIS — Z85828 Personal history of other malignant neoplasm of skin: Secondary | ICD-10-CM | POA: Diagnosis not present

## 2022-07-21 DIAGNOSIS — L570 Actinic keratosis: Secondary | ICD-10-CM | POA: Diagnosis not present

## 2022-07-21 DIAGNOSIS — Z1283 Encounter for screening for malignant neoplasm of skin: Secondary | ICD-10-CM | POA: Diagnosis not present

## 2022-10-02 ENCOUNTER — Inpatient Hospital Stay (HOSPITAL_COMMUNITY)
Admission: EM | Admit: 2022-10-02 | Discharge: 2022-10-04 | DRG: 251 | Disposition: A | Discharge status: Home / Self Care | Payer: Medicare Other | Attending: Cardiovascular Disease | Admitting: Cardiovascular Disease

## 2022-10-02 ENCOUNTER — Encounter (HOSPITAL_COMMUNITY): Payer: Self-pay

## 2022-10-02 ENCOUNTER — Emergency Department (HOSPITAL_COMMUNITY): Payer: Medicare Other

## 2022-10-02 DIAGNOSIS — F32A Depression, unspecified: Secondary | ICD-10-CM | POA: Diagnosis present

## 2022-10-02 DIAGNOSIS — K219 Gastro-esophageal reflux disease without esophagitis: Secondary | ICD-10-CM | POA: Diagnosis present

## 2022-10-02 DIAGNOSIS — F17210 Nicotine dependence, cigarettes, uncomplicated: Secondary | ICD-10-CM | POA: Diagnosis present

## 2022-10-02 DIAGNOSIS — Z85828 Personal history of other malignant neoplasm of skin: Secondary | ICD-10-CM

## 2022-10-02 DIAGNOSIS — Z803 Family history of malignant neoplasm of breast: Secondary | ICD-10-CM

## 2022-10-02 DIAGNOSIS — I4819 Other persistent atrial fibrillation: Secondary | ICD-10-CM | POA: Diagnosis present

## 2022-10-02 DIAGNOSIS — F17290 Nicotine dependence, other tobacco product, uncomplicated: Secondary | ICD-10-CM | POA: Diagnosis present

## 2022-10-02 DIAGNOSIS — I2510 Atherosclerotic heart disease of native coronary artery without angina pectoris: Secondary | ICD-10-CM | POA: Diagnosis not present

## 2022-10-02 DIAGNOSIS — Z952 Presence of prosthetic heart valve: Secondary | ICD-10-CM

## 2022-10-02 DIAGNOSIS — M1990 Unspecified osteoarthritis, unspecified site: Secondary | ICD-10-CM | POA: Diagnosis present

## 2022-10-02 DIAGNOSIS — F419 Anxiety disorder, unspecified: Secondary | ICD-10-CM | POA: Diagnosis present

## 2022-10-02 DIAGNOSIS — Z800 Family history of malignant neoplasm of digestive organs: Secondary | ICD-10-CM

## 2022-10-02 DIAGNOSIS — Z885 Allergy status to narcotic agent status: Secondary | ICD-10-CM | POA: Diagnosis not present

## 2022-10-02 DIAGNOSIS — I25110 Atherosclerotic heart disease of native coronary artery with unstable angina pectoris: Secondary | ICD-10-CM | POA: Diagnosis not present

## 2022-10-02 DIAGNOSIS — I214 Non-ST elevation (NSTEMI) myocardial infarction: Secondary | ICD-10-CM | POA: Diagnosis present

## 2022-10-02 DIAGNOSIS — E785 Hyperlipidemia, unspecified: Secondary | ICD-10-CM | POA: Diagnosis not present

## 2022-10-02 DIAGNOSIS — I352 Nonrheumatic aortic (valve) stenosis with insufficiency: Secondary | ICD-10-CM | POA: Diagnosis not present

## 2022-10-02 DIAGNOSIS — Z79899 Other long term (current) drug therapy: Secondary | ICD-10-CM | POA: Diagnosis not present

## 2022-10-02 DIAGNOSIS — I219 Acute myocardial infarction, unspecified: Secondary | ICD-10-CM

## 2022-10-02 DIAGNOSIS — Z808 Family history of malignant neoplasm of other organs or systems: Secondary | ICD-10-CM | POA: Diagnosis not present

## 2022-10-02 DIAGNOSIS — I249 Acute ischemic heart disease, unspecified: Secondary | ICD-10-CM | POA: Diagnosis present

## 2022-10-02 DIAGNOSIS — I700 Atherosclerosis of aorta: Secondary | ICD-10-CM | POA: Diagnosis not present

## 2022-10-02 DIAGNOSIS — I10 Essential (primary) hypertension: Secondary | ICD-10-CM | POA: Diagnosis present

## 2022-10-02 DIAGNOSIS — Z951 Presence of aortocoronary bypass graft: Secondary | ICD-10-CM | POA: Diagnosis not present

## 2022-10-02 DIAGNOSIS — R0789 Other chest pain: Secondary | ICD-10-CM | POA: Diagnosis not present

## 2022-10-02 DIAGNOSIS — S2242XA Multiple fractures of ribs, left side, initial encounter for closed fracture: Secondary | ICD-10-CM | POA: Diagnosis not present

## 2022-10-02 DIAGNOSIS — I252 Old myocardial infarction: Secondary | ICD-10-CM | POA: Diagnosis not present

## 2022-10-02 DIAGNOSIS — I350 Nonrheumatic aortic (valve) stenosis: Secondary | ICD-10-CM | POA: Diagnosis not present

## 2022-10-02 DIAGNOSIS — Z01818 Encounter for other preprocedural examination: Secondary | ICD-10-CM | POA: Diagnosis not present

## 2022-10-02 HISTORY — DX: Acute myocardial infarction, unspecified: I21.9

## 2022-10-02 LAB — CBC
HCT: 44.4 % (ref 39.0–52.0)
Hemoglobin: 15.2 g/dL (ref 13.0–17.0)
MCH: 32.3 pg (ref 26.0–34.0)
MCHC: 34.2 g/dL (ref 30.0–36.0)
MCV: 94.5 fL (ref 80.0–100.0)
Platelets: 223 10*3/uL (ref 150–400)
RBC: 4.7 MIL/uL (ref 4.22–5.81)
RDW: 12.7 % (ref 11.5–15.5)
WBC: 6.7 10*3/uL (ref 4.0–10.5)
nRBC: 0 % (ref 0.0–0.2)

## 2022-10-02 LAB — HEPATIC FUNCTION PANEL
ALT: 27 U/L (ref 0–44)
AST: 23 U/L (ref 15–41)
Albumin: 4.1 g/dL (ref 3.5–5.0)
Alkaline Phosphatase: 87 U/L (ref 38–126)
Bilirubin, Direct: <0.1 mg/dL (ref 0.0–0.2)
Total Bilirubin: 0.6 mg/dL (ref 0.3–1.2)
Total Protein: 7 g/dL (ref 6.5–8.1)

## 2022-10-02 LAB — DIFFERENTIAL
Abs Immature Granulocytes: 0.02 10*3/uL (ref 0.00–0.07)
Basophils Absolute: 0 10*3/uL (ref 0.0–0.1)
Basophils Relative: 1 %
Eosinophils Absolute: 0.5 10*3/uL (ref 0.0–0.5)
Eosinophils Relative: 7 %
Immature Granulocytes: 0 %
Lymphocytes Relative: 27 %
Lymphs Abs: 1.8 10*3/uL (ref 0.7–4.0)
Monocytes Absolute: 0.6 10*3/uL (ref 0.1–1.0)
Monocytes Relative: 9 %
Neutro Abs: 3.8 10*3/uL (ref 1.7–7.7)
Neutrophils Relative %: 56 %

## 2022-10-02 LAB — BASIC METABOLIC PANEL
Anion gap: 11 (ref 5–15)
BUN: 15 mg/dL (ref 8–23)
CO2: 21 mmol/L — ABNORMAL LOW (ref 22–32)
Calcium: 9.1 mg/dL (ref 8.9–10.3)
Chloride: 102 mmol/L (ref 98–111)
Creatinine, Ser: 0.94 mg/dL (ref 0.61–1.24)
GFR, Estimated: >60 mL/min (ref >60)
Glucose, Bld: 95 mg/dL (ref 70–99)
Potassium: 4.2 mmol/L (ref 3.5–5.1)
Sodium: 134 mmol/L — ABNORMAL LOW (ref 135–145)

## 2022-10-02 LAB — LIPASE, BLOOD: Lipase: 39 U/L (ref 11–51)

## 2022-10-02 LAB — TROPONIN I (HIGH SENSITIVITY)
Troponin I (High Sensitivity): 61 ng/L — ABNORMAL HIGH (ref <18)
Troponin I (High Sensitivity): 485 ng/L (ref <18)

## 2022-10-02 MED ORDER — CITALOPRAM HYDROBROMIDE 20 MG PO TABS
20.0000 mg | ORAL_TABLET | Freq: Every day | ORAL | Status: DC
  Administered 2022-10-03 – 2022-10-04 (×2): 20 mg via ORAL
  Filled 2022-10-02 (×2): qty 1

## 2022-10-02 MED ORDER — SIMVASTATIN 20 MG PO TABS: 10.0000 mg | ORAL_TABLET | Freq: Every day | ORAL | Status: DC

## 2022-10-02 MED ORDER — ASPIRIN 81 MG PO CHEW
324.0000 mg | CHEWABLE_TABLET | ORAL | Status: AC
  Administered 2022-10-02: 324 mg via ORAL
  Filled 2022-10-02 (×2): qty 4

## 2022-10-02 MED ORDER — ONDANSETRON HCL 4 MG/2ML IJ SOLN: 4.0000 mg | Freq: Four times a day (QID) | INTRAMUSCULAR | Status: DC | PRN

## 2022-10-02 MED ORDER — SODIUM CHLORIDE 0.9 % IV SOLN: INTRAVENOUS | Status: DC

## 2022-10-02 MED ORDER — ASPIRIN 81 MG PO TBEC
81.0000 mg | DELAYED_RELEASE_TABLET | Freq: Every day | ORAL | Status: DC
  Administered 2022-10-04: 81 mg via ORAL
  Filled 2022-10-02 (×2): qty 1

## 2022-10-02 MED ORDER — SODIUM CHLORIDE 0.9 % IV BOLUS
500.0000 mL | Freq: Once | INTRAVENOUS | Status: AC
  Administered 2022-10-02: 500 mL via INTRAVENOUS

## 2022-10-02 MED ORDER — NITROGLYCERIN 0.4 MG SL SUBL
0.4000 mg | SUBLINGUAL_TABLET | Freq: Once | SUBLINGUAL | Status: AC
  Administered 2022-10-02: 0.4 mg via SUBLINGUAL
  Filled 2022-10-02: qty 1

## 2022-10-02 MED ORDER — ACETAMINOPHEN 500 MG PO TABS: 500.0000 mg | ORAL_TABLET | Freq: Four times a day (QID) | ORAL | Status: DC | PRN

## 2022-10-02 MED ORDER — HEPARIN BOLUS VIA INFUSION
4000.0000 [IU] | Freq: Once | INTRAVENOUS | Status: AC
  Administered 2022-10-02: 4000 [IU] via INTRAVENOUS

## 2022-10-02 MED ORDER — NITROGLYCERIN 0.4 MG SL SUBL: 0.4000 mg | SUBLINGUAL_TABLET | SUBLINGUAL | Status: DC | PRN

## 2022-10-02 MED ORDER — METOPROLOL TARTRATE 12.5 MG HALF TABLET
12.5000 mg | ORAL_TABLET | Freq: Two times a day (BID) | ORAL | Status: DC
  Administered 2022-10-02 – 2022-10-04 (×4): 12.5 mg via ORAL
  Filled 2022-10-02 (×4): qty 1

## 2022-10-02 MED ORDER — ASPIRIN 300 MG RE SUPP
300.0000 mg | RECTAL | Status: DC
  Filled 2022-10-02: qty 1

## 2022-10-02 MED ORDER — PANTOPRAZOLE SODIUM 40 MG PO TBEC
40.0000 mg | DELAYED_RELEASE_TABLET | Freq: Every day | ORAL | Status: DC
  Administered 2022-10-03 – 2022-10-04 (×2): 40 mg via ORAL
  Filled 2022-10-02 (×2): qty 1

## 2022-10-02 MED ORDER — HEPARIN (PORCINE) 25000 UT/250ML-% IV SOLN
1350.0000 [IU]/h | INTRAVENOUS | Status: DC
  Administered 2022-10-02: 1350 [IU]/h via INTRAVENOUS
  Filled 2022-10-02: qty 250

## 2022-10-02 NOTE — ED Triage Notes (Signed)
Pt with onset of central chest pressure and nausea

## 2022-10-02 NOTE — Progress Notes (Signed)
ANTICOAGULATION CONSULT NOTE - Initial Consult  Pharmacy Consult for Heparin Indication: chest pain/ACS  Allergies  Allergen Reactions   Tramadol Other (See Comments)    Head felt funny    Patient Measurements: Height: 5\' 11"  (180.3 cm) Weight: 93 kg (205 lb) IBW/kg (Calculated) : 75.3 Heparin Dosing Weight: 93 kg  Vital Signs: Temp: 98.1 F (36.7 C) (07/04 1838) Temp Source: Oral (07/04 1838) BP: 159/84 (07/04 2000) Pulse Rate: 58 (07/04 2000)  Labs: Recent Labs    10/02/22 1848  HGB 15.2  HCT 44.4  PLT 223  CREATININE 0.94  TROPONINIHS 61*    Estimated Creatinine Clearance: 82.8 mL/min (by C-G formula based on SCr of 0.94 mg/dL).   Medical History: Past Medical History:  Diagnosis Date   Anxiety    Arthritis    Atrial fibrillation (HCC)    Basal cell carcinoma    Depression    Dysrhythmia    GERD (gastroesophageal reflux disease)    Hyperlipidemia    Squamous carcinoma     Medications:  Awaiting home med rec  Assessment: 72 y.o. M presents with CP. To begin heparin for ACS. No AC PTA. CBC ok on admission.  Goal of Therapy:  Heparin level 0.3-0.7 units/ml Monitor platelets by anticoagulation protocol: Yes   Plan:  Heparin IV bolus 4000 units Heparin gtt at 1350 units/hr Will f/u heparin level in 8 hours Daily heparin level and CBC  Christoper Fabian, PharmD, BCPS Please see amion for complete clinical pharmacist phone list 10/02/2022,8:13 PM

## 2022-10-02 NOTE — ED Provider Notes (Signed)
EMERGENCY DEPARTMENT AT [facility] Provider Note   CSN: [id number] Arrival date & time: 10/02/22  4098     History  Chief Complaint  Patient presents with   Chest Pain    Jon Jackson is a 72 y.o. male.  Patient started with chest pain about 30 minutes prior to coming to the emergency department.  He also had some pain down his left arm and states that he had some perspiring.  Patient has a history of elevated lipids and also has had ablation for atrial fibs  The history is provided by the patient and medical records. No language interpreter was used.  Chest Pain Pain location:  L chest Pain quality: aching   Pain radiates to:  Does not radiate Pain severity:  Moderate Onset quality:  Sudden Timing:  Constant Progression:  Waxing and waning Chronicity:  New Context: not breathing   Relieved by:  Nothing Associated symptoms: no abdominal pain, no back pain, no cough, no fatigue and no headache        Home Medications Prior to Admission medications   Medication Sig Start Date End Date Taking? Authorizing Provider  acetaminophen (TYLENOL) 500 MG tablet Take 1 tablet (500 mg total) by mouth every 6 (six) hours as needed. 04/24/20   Avegno, Zachery Dakins, FNP  azelastine (ASTELIN) 0.1 % nasal spray Place 2 sprays into both nostrils 2 (two) times daily. Use in each nostril as directed 07/15/21   Tommie Sams, DO  citalopram (CELEXA) 20 MG tablet Take 1 tablet (20 mg total) by mouth daily. 07/10/22   Tommie Sams, DO  levocetirizine (XYZAL) 5 MG tablet Take 5 mg by mouth daily.    [provider]  Melatonin 10 MG TABS Take 10 mg by mouth at bedtime as needed (sleep).    [provider]  Multiple Vitamin (MULTIVITAMIN) tablet Take 1 tablet by mouth daily.    [provider]  Naphazoline-Pheniramine (ALLERGY EYE OP) Place 1-2 drops into both eyes 3 (three) times daily as needed (for seasonal allergy/itchy eyes).    [provider]  omeprazole (PRILOSEC) 40 MG capsule Take 1 capsule (40 mg total) by mouth daily. 07/10/22   Tommie Sams, DO  simvastatin (ZOCOR) 20 MG tablet Take 0.5 tablets (10 mg total) by mouth daily. 07/10/22   Tommie Sams, DO      Allergies    Tramadol    Review of Systems   Review of Systems  Constitutional:  Negative for appetite change and fatigue.  HENT:  Negative for congestion, ear discharge and sinus pressure.   Eyes:  Negative for discharge.  Respiratory:  Negative for cough.   Cardiovascular:  Positive for chest pain.  Gastrointestinal:  Negative for abdominal pain and diarrhea.  Genitourinary:  Negative for frequency and hematuria.  Musculoskeletal:  Negative for back pain.  Skin:  Negative for rash.  Neurological:  Negative for seizures and headaches.  Psychiatric/Behavioral:  Negative for hallucinations.     Physical Exam Updated Vital Signs BP (!) 159/84 (BP Location: Left Arm)   Pulse (!) 58   Temp 98.1 F (36.7 C) (Oral)   Resp 12   Ht 5\' 11"  (1.803 m)   Wt 93 kg   SpO2 97%   BMI 28.59 kg/m  Physical Exam Vitals and nursing note reviewed.  Constitutional:      Appearance: He is well-developed.  HENT:     Head: Normocephalic.     Nose: Nose  normal.  Eyes:     General: No scleral icterus.    Conjunctiva/sclera: Conjunctivae normal.  Neck:     Thyroid: No thyromegaly.  Cardiovascular:     Rate and Rhythm: Normal rate and regular rhythm.     Heart sounds: No murmur heard.    No friction rub. No gallop.  Pulmonary:     Breath sounds: No stridor. No wheezing or rales.  Chest:     Chest wall: No tenderness.  Abdominal:     General: There is no distension.     Tenderness: There is no abdominal tenderness. There is no rebound.  Musculoskeletal:        General: Normal range of motion.     Cervical back: Neck supple.  Lymphadenopathy:     Cervical: No cervical adenopathy.  Skin:    Findings: No erythema or rash.  Neurological:     Mental  Status: He is alert and oriented to person, place, and time.     Motor: No abnormal muscle tone.     Coordination: Coordination normal.  Psychiatric:        Behavior: Behavior normal.     ED Results / Procedures / Treatments   Labs (all labs ordered are listed, but only abnormal results are displayed) Labs Reviewed  BASIC METABOLIC PANEL - Abnormal; Notable for the following components:      Result Value   Sodium 134 (*)    CO2 21 (*)    All other components within normal limits  TROPONIN I (HIGH SENSITIVITY) - Abnormal; Notable for the following components:   Troponin I (High Sensitivity) 61 (*)    All other components within normal limits  CBC  HEPATIC FUNCTION PANEL  LIPASE, BLOOD  DIFFERENTIAL    EKG None  Radiology DG Chest 2 View  Result Date: 10/02/2022 CLINICAL DATA:  Chest pain and pressure in the center of chest EXAM: CHEST - 2 VIEW COMPARISON:  Radiographs 07/15/2021 FINDINGS: Stable cardiomediastinal silhouette. Aortic atherosclerotic calcification. Left basilar scarring. No focal consolidation, pleural effusion, or pneumothorax. No displaced rib fractures. Remote left rib fractures. IMPRESSION: No active cardiopulmonary disease. Electronically Signed   By: Minerva Fester M.D.   On: 10/02/2022 19:05    Procedures Procedures    Medications Ordered in ED Medications  sodium chloride 0.9 % bolus 500 mL (500 mLs Intravenous New Bag/Given 10/02/22 1922)  nitroGLYCERIN (NITROSTAT) SL tablet 0.4 mg (0.4 mg Sublingual Given 10/02/22 1922)    ED Course/ Medical Decision Making/ A&P  CRITICAL CARE Performed by: Bethann Berkshire Total critical care time: 45 minutes Critical care time was exclusive of separately billable procedures and treating other patients. Critical care was necessary to treat or prevent imminent or life-threatening deterioration. Critical care was time spent personally by me on the following activities: development of treatment plan with patient and/or  surrogate as well as nursing, discussions with consultants, evaluation of patient's response to treatment, examination of patient, obtaining history from patient or surrogate, ordering and performing treatments and interventions, ordering and review of laboratory studies, ordering and review of radiographic studies, pulse oximetry and re-evaluation of patient's condition.                            Medical Decision Making Amount and/or Complexity of Data Reviewed Labs: ordered. Radiology: ordered.  Risk Prescription drug management. Decision regarding hospitalization.  .mil This patient presents to the ED for concern of chest pain, this involves an extensive  number of treatment options, and is a complaint that carries with it a high risk of complications and morbidity.  The differential diagnosis includes PE, MI   Co morbidities that complicate the patient evaluation  Hyperlipidemia   Additional history obtained:  Additional history obtained from friend External records from outside source obtained and reviewed including hospital records   Lab Tests:  I Ordered, and personally interpreted labs.  The pertinent results include: First troponin 61-second troponin 45   Imaging Studies ordered:  I ordered imaging studies including chest x-ray I independently visualized and interpreted imaging which showed negative I agree with the radiologist interpretation   Cardiac Monitoring: / EKG:  The patient was maintained on a cardiac monitor.  I personally viewed and interpreted the cardiac monitored which showed an underlying rhythm of: Normal sinus rhythm   Consultations Obtained:  I requested consultation with the cardiology Dr. Algie Coffer,  and discussed lab and imaging findings as well as pertinent plan - they recommend: Admission, placed on heparin   Problem List / ED Course / Critical interventions / Medication management  NSTEMI, hyperlipidemia I ordered medication  including heparin for NSTEMI Reevaluation of the patient after these medicines showed that the patient stayed the same I have reviewed the patients home medicines and have made adjustments as needed   Social Determinants of Health:  None   Test / Admission - Considered:  None  NSTEMI that will be admitted to [facility]        Final Clinical Impression(s) / ED Diagnoses Final diagnoses:  None    Rx / DC Orders ED Discharge Orders     None         Bethann Berkshire, MD 10/03/22 1443

## 2022-10-02 NOTE — H&P (View-Only) (Signed)
ANTICOAGULATION CONSULT NOTE - Initial Consult  Pharmacy Consult for Heparin Indication: chest pain/ACS  Allergies  Allergen Reactions   Tramadol Other (See Comments)    Head felt funny    Patient Measurements: Height: 5' 11" (180.3 cm) Weight: 93 kg (205 lb) IBW/kg (Calculated) : 75.3 Heparin Dosing Weight: 93 kg  Vital Signs: Temp: 98.1 F (36.7 C) (07/04 1838) Temp Source: Oral (07/04 1838) BP: 159/84 (07/04 2000) Pulse Rate: 58 (07/04 2000)  Labs: Recent Labs    10/02/22 1848  HGB 15.2  HCT 44.4  PLT 223  CREATININE 0.94  TROPONINIHS 61*    Estimated Creatinine Clearance: 82.8 mL/min (by C-G formula based on SCr of 0.94 mg/dL).   Medical History: Past Medical History:  Diagnosis Date   Anxiety    Arthritis    Atrial fibrillation (HCC)    Basal cell carcinoma    Depression    Dysrhythmia    GERD (gastroesophageal reflux disease)    Hyperlipidemia    Squamous carcinoma     Medications:  Awaiting home med rec  Assessment: 72 y.o. M presents with CP. To begin heparin for ACS. No AC PTA. CBC ok on admission.  Goal of Therapy:  Heparin level 0.3-0.7 units/ml Monitor platelets by anticoagulation protocol: Yes   Plan:  Heparin IV bolus 4000 units Heparin gtt at 1350 units/hr Will f/u heparin level in 8 hours Daily heparin level and CBC  Celise Bazar, PharmD, BCPS Please see amion for complete clinical pharmacist phone list 10/02/2022,8:13 PM   

## 2022-10-02 NOTE — H&P (Signed)
Referring Physician: Bethann Berkshire, MD  Jon Jackson is an 72 y.o. male.                       Chief Complaint: Chest pain  HPI: 72 years old male with substernal chest pain radiating to left arm and sweating spell. He has PMH of Anxiety, arthritis, Atrial fibrillation with ablation in 2018 and now sinus rhythm and HLD. His troponin I levels are rising from 61 to 485 ng. CXR: Unremarkable. 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  Past Medical History:  Diagnosis Date   Anxiety    Arthritis    Atrial fibrillation (HCC)    Basal cell carcinoma    Depression    Dysrhythmia    GERD (gastroesophageal reflux disease)    Hyperlipidemia    Squamous carcinoma       Past Surgical History:  Procedure Laterality Date   ATRIAL FIBRILLATION ABLATION  07/22/2016   ATRIAL FIBRILLATION ABLATION N/A 07/22/2016   Procedure: Atrial Fibrillation Ablation;  Surgeon: Will Jorja Loa, MD;  Location: [facility];  Service: Cardiovascular;  Laterality: N/A;   BASAL CELL CARCINOMA EXCISION     forehead; top of scalp   BIOPSY  05/27/2017   Procedure: BIOPSY;  Surgeon: Corbin Ade, MD;  Location: [facility];  Service: Endoscopy;;  right colon, left colon;   CARDIOVERSION N/A 04/07/2016   Procedure: CARDIOVERSION;  Surgeon: Jonelle Sidle, MD;  Location: [facility];  Service: Cardiovascular;  Laterality: N/A;   COLECTOMY  1990s   removal of section due to strangulated bowel.   COLONOSCOPY N/A 05/30/2013   Procedure: COLONOSCOPY;  Surgeon: Corbin Ade, MD;  Location: [facility];  Service: Endoscopy;  Laterality: N/A;  8:45 AM   COLONOSCOPY     COLONOSCOPY N/A 05/27/2017   Procedure: COLONOSCOPY;  Surgeon: Corbin Ade, MD;  Location: [facility];  Service: Endoscopy;  Laterality: N/A;  1:00pm   INGUINAL HERNIA REPAIR Right 1990s   LUMBAR LAMINECTOMY/DECOMPRESSION MICRODISCECTOMY N/A 12/29/2018   Procedure: Decompressive lumbar laminectomy at L3-L4;  Surgeon: Ranee Gosselin, MD;  Location: [facility];  Service: Orthopedics;  Laterality: N/A;    SHOULDER ARTHROSCOPY WITH ROTATOR CUFF REPAIR AND SUBACROMIAL DECOMPRESSION Left 05/15/2016   Procedure: LEFT SHOULDER ARTHROSCOPY WITH ROTATOR CUFF REPAIR AND SUBACROMIAL DECOMPRESSION AND DISTAL CLAVICLE RESECTION;  Surgeon: Francena Hanly, MD;  Location: [facility];  Service: Orthopedics;  Laterality: Left;  requests 2hrs   SHOULDER SURGERY Right    SQUAMOUS CELL CARCINOMA EXCISION     scalp   TONSILLECTOMY      Family History  Problem Relation Age of Onset   Colon cancer Brother 53   Throat cancer Brother    Breast cancer Mother    Social History:  reports that he has been smoking cigars and cigarettes. He has a 23.50 pack-year smoking history. He has never used smokeless tobacco. He reports current alcohol use of about 12.0 standard drinks of alcohol per week. He reports that he does not use drugs.  Allergies:  Allergies  Allergen Reactions   Tramadol Other (See Comments)    Head felt funny    Medications Prior to Admission  Medication Sig Dispense Refill   acetaminophen (TYLENOL) 500 MG tablet Take 1 tablet (500 mg total) by mouth every 6 (six) hours as needed. 30 tablet 0   azelastine (ASTELIN) 0.1 % nasal spray Place 2 sprays into both nostrils 2 (two) times daily. Use in  each nostril as directed 30 mL 12   citalopram (CELEXA) 20 MG tablet Take 1 tablet (20 mg total) by mouth daily. 90 tablet 1   levocetirizine (XYZAL) 5 MG tablet Take 5 mg by mouth daily.     Melatonin 10 MG TABS Take 10 mg by mouth at bedtime as needed (sleep).     Multiple Vitamin (MULTIVITAMIN) tablet Take 1 tablet by mouth daily.     Naphazoline-Pheniramine (ALLERGY EYE OP) Place 1-2 drops into both eyes 3 (three) times daily as needed (for seasonal allergy/itchy eyes).     omeprazole (PRILOSEC) 40 MG capsule Take 1 capsule (40 mg total) by mouth daily. 90 capsule 1   simvastatin (ZOCOR) 20 MG tablet Take 0.5 tablets (10 mg total) by mouth daily. 90 tablet  1    Results for orders placed or performed during the hospital encounter of 10/02/22 (from the past 48 hour(s))  Hepatic function panel     Status: None   Collection Time: 10/02/22  6:46 PM  Result Value Ref Range   Total Protein 7.0 6.5 - 8.1 g/dL   Albumin 4.1 3.5 - 5.0 g/dL   AST 23 15 - 41 U/L   ALT 27 0 - 44 U/L   Alkaline Phosphatase 87 38 - 126 U/L   Total Bilirubin 0.6 0.3 - 1.2 mg/dL   Bilirubin, Direct <4.0 0.0 - 0.2 mg/dL   Indirect Bilirubin NOT CALCULATED 0.3 - 0.9 mg/dL    Comment: Performed at [facility], [address] [place], [place] [address]  Lipase, blood     Status: None   Collection Time: 10/02/22  6:46 PM  Result Value Ref Range   Lipase 39 11 - 51 U/L    Comment: Performed at [facility], [address], [place], [place] [address]  Basic metabolic panel     Status: Abnormal   Collection Time: 10/02/22  6:48 PM  Result Value Ref Range   Sodium 134 (L) 135 - 145 mmol/L   Potassium 4.2 3.5 - 5.1 mmol/L   Chloride 102 98 - 111 mmol/L   CO2 21 (L) 22 - 32 mmol/L   Glucose, Bld 95 70 - 99 mg/dL    Comment: Glucose reference range applies only to samples taken after fasting for at least 8 hours.   BUN 15 8 - 23 mg/dL   Creatinine, Ser 9.56 0.61 - 1.24 mg/dL   Calcium 9.1 8.9 - 21.3 mg/dL   GFR, Estimated >08 >65 mL/min    Comment: (NOTE) Calculated using the CKD-EPI Creatinine Equation (2021)    Anion gap 11 5 - 15    Comment: Performed at [facility], [address], [place], [place] [address]  CBC     Status: None   Collection Time: 10/02/22  6:48 PM  Result Value Ref Range   WBC 6.7 4.0 - 10.5 K/uL   RBC 4.70 4.22 - 5.81 MIL/uL   Hemoglobin 15.2 13.0 - 17.0 g/dL   HCT 62.9 52.8 - 41.3 %   MCV 94.5 80.0 - 100.0 fL   MCH 32.3 26.0 - 34.0 pg   MCHC 34.2 30.0 - 36.0 g/dL   RDW 24.4 01.0 - 27.2 %   Platelets 223 150 - 400 K/uL   nRBC 0.0 0.0 - 0.2 %    Comment: Performed at [facility], [address] [place], [place] [address]  Troponin I  (High Sensitivity)     Status: Abnormal   Collection Time: 10/02/22  6:48 PM  Result  Value Ref Range   Troponin I (High Sensitivity) 61 (H) <18 ng/L    Comment: (NOTE) Elevated high sensitivity troponin I (hsTnI) values and significant  changes across serial measurements may suggest ACS but many other  chronic and acute conditions are known to elevate hsTnI results.  Refer to the "Links" section for chest pain algorithms and additional  guidance. Performed at [facility], [address] [place], [place] [address]   Differential     Status: None   Collection Time: 10/02/22  6:48 PM  Result Value Ref Range   Neutrophils Relative % 56 %   Neutro Abs 3.8 1.7 - 7.7 K/uL   Lymphocytes Relative 27 %   Lymphs Abs 1.8 0.7 - 4.0 K/uL   Monocytes Relative 9 %   Monocytes Absolute 0.6 0.1 - 1.0 K/uL   Eosinophils Relative 7 %   Eosinophils Absolute 0.5 0.0 - 0.5 K/uL   Basophils Relative 1 %   Basophils Absolute 0.0 0.0 - 0.1 K/uL   Immature Granulocytes 0 %   Abs Immature Granulocytes 0.02 0.00 - 0.07 K/uL    Comment: Performed at [facility], [address], [place], [place] [address]  Troponin I (High Sensitivity)     Status: Abnormal   Collection Time: 10/02/22  8:56 PM  Result Value Ref Range   Troponin I (High Sensitivity) 485 (HH) <18 ng/L    Comment: CRITICAL RESULT CALLED TO, READ BACK BY AND VERIFIED WITH C TURNER AT [id number] ON [id number] BY K FORSYTH DELTA CHECK NOTED (NOTE) Elevated high sensitivity troponin I (hsTnI) values and significant  changes across serial measurements may suggest ACS but many other  chronic and acute conditions are known to elevate hsTnI results.  Refer to the "Links" section for chest pain algorithms and additional  guidance. Performed at [facility], [address] [place], [place] [address]    DG Chest 2 View  Result Date: 10/02/2022 CLINICAL DATA:  Chest pain and pressure in the center of chest EXAM: CHEST - 2 VIEW COMPARISON:  Radiographs  07/15/2021 FINDINGS: Stable cardiomediastinal silhouette. Aortic atherosclerotic calcification. Left basilar scarring. No focal consolidation, pleural effusion, or pneumothorax. No displaced rib fractures. Remote left rib fractures. IMPRESSION: No active cardiopulmonary disease. Electronically Signed   By: Minerva Fester M.D.   On: 10/02/2022 19:05    Review Of Systems Constitutional: No fever, chills, weight loss or gain. Eyes: No vision change, wears glasses. No discharge or pain. Ears: No hearing loss, No tinnitus. Respiratory: No asthma, COPD, pneumonias. Positive shortness of breath. No hemoptysis. Cardiovascular: Positive chest pain, palpitation, no leg edema. Gastrointestinal: No nausea, vomiting, diarrhea, constipation. No GI bleed. No hepatitis. Genitourinary: No dysuria, hematuria, kidney stone. No incontinance. Neurological: No headache, stroke, seizures.  Psychiatry: No psych facility admission for anxiety, depression, suicide. No detox. Skin: No rash. Musculoskeletal: No joint pain, fibromyalgia. No neck pain, back pain. Lymphadenopathy: No lymphadenopathy. Hematology: No anemia or easy bruising.   Blood pressure (!) 165/68, pulse 65, temperature 98.7 F (37.1 C), temperature source Oral, resp. rate 19, height 5' 10.5" (1.791 m), weight 94.7 kg, SpO2 96 %. Body mass index is 29.53 kg/m. General appearance: alert, cooperative, appears stated age and no distress Head: Normocephalic, atraumatic. Eyes: Light Brown eyes, pink conjunctiva, corneas clear.  Neck: No adenopathy, no carotid bruit, no JVD, supple, symmetrical, trachea midline and thyroid not enlarged. Resp: Clear to auscultation bilaterally. Cardio: Regular rate and rhythm, S1, S2 normal, II/VI systolic murmur, no click, rub or gallop GI:  Soft, non-tender; bowel sounds normal; no organomegaly. Extremities: No edema, cyanosis or clubbing. Skin: Warm and dry.  Neurologic: Alert and oriented X 3, normal strength. Normal  coordination.  Assessment/Plan NSTEMI HLD H/O atrial fibrillation s/p ablation  Plan: Admit NPO after mid-night Cardiac catheterization with coronary angiography and intervention. Patient understood procedure, risks and alternatives and wants me to proceed.  Time spent: Review of old records, Lab, x-rays, EKG, other cardiac tests, examination, discussion with patient/Family/Nurse/Doctor over 70 minutes.  [name], MD  10/02/2022, 10:21 PM

## 2022-10-03 ENCOUNTER — Inpatient Hospital Stay (HOSPITAL_COMMUNITY): Payer: Medicare Other

## 2022-10-03 ENCOUNTER — Encounter (HOSPITAL_COMMUNITY)
Admission: EM | Disposition: A | Discharge status: Home / Self Care | Payer: Self-pay | Source: Home / Self Care | Attending: Cardiovascular Disease

## 2022-10-03 ENCOUNTER — Other Ambulatory Visit: Payer: Self-pay

## 2022-10-03 DIAGNOSIS — I350 Nonrheumatic aortic (valve) stenosis: Secondary | ICD-10-CM

## 2022-10-03 DIAGNOSIS — I2510 Atherosclerotic heart disease of native coronary artery without angina pectoris: Secondary | ICD-10-CM

## 2022-10-03 DIAGNOSIS — I214 Non-ST elevation (NSTEMI) myocardial infarction: Secondary | ICD-10-CM

## 2022-10-03 DIAGNOSIS — I25110 Atherosclerotic heart disease of native coronary artery with unstable angina pectoris: Secondary | ICD-10-CM | POA: Diagnosis not present

## 2022-10-03 HISTORY — PX: RIGHT/LEFT HEART CATH AND CORONARY ANGIOGRAPHY: CATH118266

## 2022-10-03 HISTORY — PX: RIGHT HEART CATH AND CORONARY ANGIOGRAPHY: CATH118264

## 2022-10-03 LAB — POCT I-STAT EG7
Acid-base deficit: 1 mmol/L (ref 0.0–2.0)
Acid-base deficit: 1 mmol/L (ref 0.0–2.0)
Bicarbonate: 24.9 mmol/L (ref 20.0–28.0)
Bicarbonate: 25 mmol/L (ref 20.0–28.0)
Calcium, Ion: 1.24 mmol/L (ref 1.15–1.40)
Calcium, Ion: 1.26 mmol/L (ref 1.15–1.40)
HCT: 42 % (ref 39.0–52.0)
HCT: 43 % (ref 39.0–52.0)
Hemoglobin: 14.3 g/dL (ref 13.0–17.0)
Hemoglobin: 14.6 g/dL (ref 13.0–17.0)
O2 Saturation: 73 %
O2 Saturation: 76 %
Potassium: 4.2 mmol/L (ref 3.5–5.1)
Potassium: 4.2 mmol/L (ref 3.5–5.1)
Sodium: 138 mmol/L (ref 135–145)
Sodium: 138 mmol/L (ref 135–145)
TCO2: 26 mmol/L (ref 22–32)
TCO2: 26 mmol/L (ref 22–32)
pCO2, Ven: 44.4 mmHg (ref 44–60)
pCO2, Ven: 44.5 mmHg (ref 44–60)
pH, Ven: 7.356 (ref 7.25–7.43)
pH, Ven: 7.358 (ref 7.25–7.43)
pO2, Ven: 41 mmHg (ref 32–45)
pO2, Ven: 43 mmHg (ref 32–45)

## 2022-10-03 LAB — CBC
HCT: 44.5 % (ref 39.0–52.0)
Hemoglobin: 14.9 g/dL (ref 13.0–17.0)
MCH: 31.6 pg (ref 26.0–34.0)
MCHC: 33.5 g/dL (ref 30.0–36.0)
MCV: 94.5 fL (ref 80.0–100.0)
Platelets: 204 10*3/uL (ref 150–400)
RBC: 4.71 MIL/uL (ref 4.22–5.81)
RDW: 12.5 % (ref 11.5–15.5)
WBC: 7.8 10*3/uL (ref 4.0–10.5)
nRBC: 0 % (ref 0.0–0.2)

## 2022-10-03 LAB — POCT I-STAT 7, (LYTES, BLD GAS, ICA,H+H)
Acid-base deficit: 2 mmol/L (ref 0.0–2.0)
Bicarbonate: 22.3 mmol/L (ref 20.0–28.0)
Calcium, Ion: 1.22 mmol/L (ref 1.15–1.40)
HCT: 42 % (ref 39.0–52.0)
Hemoglobin: 14.3 g/dL (ref 13.0–17.0)
O2 Saturation: 95 %
Potassium: 4.2 mmol/L (ref 3.5–5.1)
Sodium: 139 mmol/L (ref 135–145)
TCO2: 23 mmol/L (ref 22–32)
pCO2 arterial: 36.6 mmHg (ref 32–48)
pH, Arterial: 7.393 (ref 7.35–7.45)
pO2, Arterial: 75 mmHg — ABNORMAL LOW (ref 83–108)

## 2022-10-03 LAB — ECHOCARDIOGRAM COMPLETE
AV Mean grad: 33 mmHg
AV Peak grad: 60.8 mmHg
Ao pk vel: 3.9 m/s
Area-P 1/2: 2.99 cm2
Calc EF: 53.5 %
Height: 70.5 in
Single Plane A2C EF: 52.6 %
Single Plane A4C EF: 52.3 %
Weight: 3340.41 oz

## 2022-10-03 LAB — LIPID PANEL
Cholesterol: 186 mg/dL (ref 0–200)
HDL: 47 mg/dL (ref >40)
LDL Cholesterol: 124 mg/dL — ABNORMAL HIGH (ref 0–99)
Total CHOL/HDL Ratio: 4 RATIO
Triglycerides: 75 mg/dL (ref <150)
VLDL: 15 mg/dL (ref 0–40)

## 2022-10-03 LAB — BASIC METABOLIC PANEL
Anion gap: 10 (ref 5–15)
BUN: 14 mg/dL (ref 8–23)
CO2: 22 mmol/L (ref 22–32)
Calcium: 9 mg/dL (ref 8.9–10.3)
Chloride: 106 mmol/L (ref 98–111)
Creatinine, Ser: 0.93 mg/dL (ref 0.61–1.24)
GFR, Estimated: >60 mL/min (ref >60)
Glucose, Bld: 99 mg/dL (ref 70–99)
Potassium: 4.2 mmol/L (ref 3.5–5.1)
Sodium: 138 mmol/L (ref 135–145)

## 2022-10-03 LAB — HEPARIN LEVEL (UNFRACTIONATED): Heparin Unfractionated: 0.31 IU/mL (ref 0.30–0.70)

## 2022-10-03 SURGERY — RIGHT/LEFT HEART CATH AND CORONARY ANGIOGRAPHY
Anesthesia: LOCAL

## 2022-10-03 MED ORDER — MIDAZOLAM HCL 2 MG/2ML IJ SOLN
INTRAMUSCULAR | Status: AC
  Filled 2022-10-03: qty 2

## 2022-10-03 MED ORDER — LIDOCAINE HCL (PF) 1 % IJ SOLN
INTRAMUSCULAR | Status: DC | PRN
  Administered 2022-10-03: 15 mL
  Administered 2022-10-03: 10 mL

## 2022-10-03 MED ORDER — FENTANYL CITRATE (PF) 100 MCG/2ML IJ SOLN
INTRAMUSCULAR | Status: DC | PRN
  Administered 2022-10-03: 25 ug via INTRAVENOUS

## 2022-10-03 MED ORDER — ATORVASTATIN CALCIUM 40 MG PO TABS
40.0000 mg | ORAL_TABLET | Freq: Every day | ORAL | Status: DC
  Administered 2022-10-03 – 2022-10-04 (×2): 40 mg via ORAL
  Filled 2022-10-03 (×2): qty 1

## 2022-10-03 MED ORDER — ASPIRIN 81 MG PO CHEW
CHEWABLE_TABLET | ORAL | Status: AC
  Filled 2022-10-03: qty 1

## 2022-10-03 MED ORDER — SODIUM CHLORIDE 0.9 % IV SOLN: INTRAVENOUS | Status: DC

## 2022-10-03 MED ORDER — SODIUM CHLORIDE 0.9 % IV SOLN: 250.0000 mL | INTRAVENOUS | Status: DC | PRN

## 2022-10-03 MED ORDER — HEPARIN SODIUM (PORCINE) 5000 UNIT/ML IJ SOLN
5000.0000 [IU] | Freq: Three times a day (TID) | INTRAMUSCULAR | Status: DC
  Administered 2022-10-03 – 2022-10-04 (×2): 5000 [IU] via SUBCUTANEOUS
  Filled 2022-10-03 (×2): qty 1

## 2022-10-03 MED ORDER — HEPARIN (PORCINE) IN NACL 1000-0.9 UT/500ML-% IV SOLN
INTRAVENOUS | Status: DC | PRN
  Administered 2022-10-03 (×2): 500 mL

## 2022-10-03 MED ORDER — SODIUM CHLORIDE 0.9% FLUSH: 3.0000 mL | Freq: Two times a day (BID) | INTRAVENOUS | Status: DC

## 2022-10-03 MED ORDER — MIDAZOLAM HCL 2 MG/2ML IJ SOLN
INTRAMUSCULAR | Status: DC | PRN
  Administered 2022-10-03: 1 mg via INTRAVENOUS

## 2022-10-03 MED ORDER — LABETALOL HCL 5 MG/ML IV SOLN: 10.0000 mg | INTRAVENOUS | Status: AC | PRN

## 2022-10-03 MED ORDER — SODIUM CHLORIDE 0.9 % IV SOLN: INTRAVENOUS | Status: AC

## 2022-10-03 MED ORDER — IOHEXOL 350 MG/ML SOLN
INTRAVENOUS | Status: DC | PRN
  Administered 2022-10-03: 100 mL

## 2022-10-03 MED ORDER — FENTANYL CITRATE (PF) 100 MCG/2ML IJ SOLN
INTRAMUSCULAR | Status: AC
  Filled 2022-10-03: qty 2

## 2022-10-03 MED ORDER — ASPIRIN 81 MG PO CHEW
CHEWABLE_TABLET | ORAL | Status: DC | PRN
  Administered 2022-10-03: 81 mg via ORAL

## 2022-10-03 MED ORDER — ASPIRIN 81 MG PO CHEW: 81.0000 mg | CHEWABLE_TABLET | ORAL | Status: DC

## 2022-10-03 MED ORDER — LIDOCAINE HCL (PF) 1 % IJ SOLN
INTRAMUSCULAR | Status: AC
  Filled 2022-10-03: qty 30

## 2022-10-03 MED ORDER — MELATONIN 3 MG PO TABS
3.0000 mg | ORAL_TABLET | Freq: Every day | ORAL | Status: DC
  Administered 2022-10-03: 3 mg via ORAL
  Filled 2022-10-03: qty 1

## 2022-10-03 MED ORDER — SODIUM CHLORIDE 0.9% FLUSH: 3.0000 mL | INTRAVENOUS | Status: DC | PRN

## 2022-10-03 MED ORDER — HYDRALAZINE HCL 20 MG/ML IJ SOLN: 10.0000 mg | INTRAMUSCULAR | Status: AC | PRN

## 2022-10-03 SURGICAL SUPPLY — 14 items
CATH INFINITI 5FR AL1 (CATHETERS) IMPLANT
CATH INFINITI 5FR MULTPACK ANG (CATHETERS) IMPLANT
CATH SWAN GANZ 7F STRAIGHT (CATHETERS) IMPLANT
CLOSURE MYNX CONTROL 5F (Vascular Products) IMPLANT
CLOSURE MYNX CONTROL 6F/7F (Vascular Products) IMPLANT
GLIDESHEATH SLEND SS 6F .021 (SHEATH) IMPLANT
KIT HEART LEFT (KITS) ×1 IMPLANT
PACK CARDIAC CATHETERIZATION (CUSTOM PROCEDURE TRAY) ×1 IMPLANT
SHEATH PINNACLE 5F 10CM (SHEATH) IMPLANT
SHEATH PINNACLE 7F 10CM (SHEATH) IMPLANT
TRANSDUCER W/STOPCOCK (MISCELLANEOUS) ×1 IMPLANT
WIRE EMERALD 3MM-J .035X150CM (WIRE) IMPLANT
WIRE EMERALD 3MM-J .035X260CM (WIRE) IMPLANT
WIRE EMERALD ST .035X260CM (WIRE) IMPLANT

## 2022-10-03 NOTE — Consult Note (Addendum)
[address]       [place] [address]             [phone]        LADON [facility]LUETER [facility] Medical Record #[id number] Date of Birth: 1950/04/14  Referring: [name], MD  Primary Care: Tommie Sams, DO Primary Cardiologist:None  Reason for Consult: Evaluation for potential aortic valve replacement and CABG after presenting with acute NSTEMI.   History of Present Illness:     Jon Jackson. Mcewan is a 72 year old gentleman with past history notable for atrial fibrillation status post ablation in 2018 with successful conversion to sinus rhythm.  He has a history of hypertension and dyslipidemia along with anxiety and depression.  He was in his usual state of health last evening when he developed chest pain radiating to his left arm associated with nausea and diaphoresis.  He was brought to the [facility] emergency room.  Initial high-sensitivity troponin was 61 and later rose to 485.  Other workup including a chest x-ray was unremarkable.  EKG showed question of an old inferior myocardial infarction.  He was seen by Dr. Algie Coffer.  He was admitted to the hospital with acute non-ST elevation myocardial infarction and started on a heparin infusion.  He underwent left heart catheterization earlier today demonstrating a 70% proximal right coronary artery stenosis, normal LVEDP, severe aortic stenosis, and a 2+ aortic insufficiency.  Echocardiogram performed today confirms severe aortic stenosis with severe valvular calcification and mild AI. The mean aortic valve gradient today is with a peak gradient of 60.64mmHg. I do not see a calculate aortic valve area on the Echo done today.  Left ventricular ejection fraction by echo is 55-60%. RV function is preserved.   We have been asked to evaluate Jon Jackson for consideration of coronary bypass grafting along with aortic valve replacement. He is aware of having a murmur that was first noted after his ablation  procedure in 2018. An echo done in 2021 showed an aortic valve area of 1.6cm2 and a mean gradient of and peak gradient of .  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  He usually has regular dental visits but hasn't been in over 6 months.  He has several crowns and is aware that he needs some dental work but could not be specific about which teeth were involved or what needed to be done.    Current Activity/ Functional Status: Patient is independent with mobility/ambulation, transfers, ADL's, IADL's.   Zubrod Score: At the time of surgery this patient's most appropriate activity status/level should be described as: []     0    Normal activity, no symptoms [x]     1    Restricted in physical strenuous activity but ambulatory, able to do out light work []     2    Ambulatory and capable of self care, unable to do work activities, up and about                 more than 50%  Of the time                            []     3    Only limited self care, in bed greater than 50% of waking hours []     4  Completely disabled, no self care, confined to bed or chair []     5    Moribund  Past Medical History:  Diagnosis Date   Anxiety    Arthritis    Atrial fibrillation (HCC)    Basal cell carcinoma    Depression    Dysrhythmia    GERD (gastroesophageal reflux disease)    Hyperlipidemia    Squamous carcinoma     Past Surgical History:  Procedure Laterality Date   ATRIAL FIBRILLATION ABLATION  07/22/2016   ATRIAL FIBRILLATION ABLATION N/A 07/22/2016   Procedure: Atrial Fibrillation Ablation;  Surgeon: Will Jorja Loa, MD;  Location: [facility] INVASIVE CV LAB;  Service: Cardiovascular;  Laterality: N/A;   BASAL CELL CARCINOMA EXCISION     forehead; top of scalp   BIOPSY  05/27/2017   Procedure: BIOPSY;  Surgeon: Corbin Ade, MD;  Location: [facility] ENDO SUITE;  Service: Endoscopy;;  right colon, left colon;   CARDIOVERSION  N/A 04/07/2016   Procedure: CARDIOVERSION;  Surgeon: Jonelle Sidle, MD;  Location: [facility] ORS;  Service: Cardiovascular;  Laterality: N/A;   COLECTOMY  1990s   removal of section due to strangulated bowel.   COLONOSCOPY N/A 05/30/2013   Procedure: COLONOSCOPY;  Surgeon: Corbin Ade, MD;  Location: [facility] END[facility];  Service: Endoscopy;  Laterality: N/A;  8:45 AM   COLONOSCOPY     COLONOSCOPY N/A 05/27/2017   Procedure: COLONOSCOPY;  Surgeon: Corbin Ade, MD;  Location: [facility];  Service: Endoscopy;  Laterality: N/A;  1:00pm   INGUINAL HERNIA REPAIR Right 1990s   LUMBAR LAMINECTOMY/DECOMPRESSION MICRODISCECTOMY N/A 12/29/2018   Procedure: Decompressive lumbar laminectomy at L3-L4;  Surgeon: Ranee Gosselin, MD;  Location: [facility] ORS;  Service: Orthopedics;  Laterality: N/A;    SHOULDER ARTHROSCOPY WITH ROTATOR CUFF REPAIR AND SUBACROMIAL DECOMPRESSION Left 05/15/2016   Procedure: LEFT SHOULDER ARTHROSCOPY WITH ROTATOR CUFF REPAIR AND SUBACROMIAL DECOMPRESSION AND DISTAL CLAVICLE RESECTION;  Surgeon: Francena Hanly, MD;  Location: [facility] OR;  Service: Orthopedics;  Laterality: Left;  requests 2hrs   SHOULDER SURGERY Right    SQUAMOUS CELL CARCINOMA EXCISION     scalp   TONSILLECTOMY      Social History   Tobacco Use  Smoking Status Some Days   Packs/day: 0.50   Years: 47.00   Additional pack years: 0.00   Total pack years: 23.50   Types: Cigars, Cigarettes  Smokeless Tobacco Never    Social History   Substance and Sexual Activity  Alcohol Use Yes   Alcohol/week: 12.0 standard drinks of alcohol   Types: 12 Cans of beer per week   Comment: daily 3-4 in the summer; soemtimes more on weekends.     Allergies  Allergen Reactions   Tramadol Other (See Comments)    Head felt funny    Current Facility-Administered Medications  Medication Dose Route Frequency Provider Last Rate Last Admin   0.9 %  sodium chloride infusion   Intravenous Continuous Orpah Cobb, MD 30 mL/hr at  10/03/22 0100 Infusion Verify at 10/03/22 0100   0.9 %  sodium chloride infusion   Intravenous Continuous Orpah Cobb, MD       acetaminophen (TYLENOL) tablet 500 mg  500 mg Oral Q6H PRN Orpah Cobb, MD       aspirin EC tablet 81 mg  81 mg Oral Daily Orpah Cobb, MD       atorvastatin (LIPITOR) tablet 40 mg  40 mg Oral Daily Orpah Cobb, MD       citalopram (  CELEXA) tablet 20 mg  20 mg Oral Daily Orpah Cobb, MD       heparin injection 5,000 Units  5,000 Units Subcutaneous Q8H Orpah Cobb, MD       hydrALAZINE (APRESOLINE) injection 10 mg  10 mg Intravenous Q20 Min PRN Orpah Cobb, MD       labetalol (NORMODYNE) injection 10 mg  10 mg Intravenous Q10 min PRN Orpah Cobb, MD       metoprolol tartrate (LOPRESSOR) tablet 12.5 mg  12.5 mg Oral BID Orpah Cobb, MD   12.5 mg at 10/02/22 2258   nitroGLYCERIN (NITROSTAT) SL tablet 0.4 mg  0.4 mg Sublingual Q5 Min x 3 PRN Orpah Cobb, MD       ondansetron (ZOFRAN) injection 4 mg  4 mg Intravenous Q6H PRN Orpah Cobb, MD       pantoprazole (PROTONIX) EC tablet 40 mg  40 mg Oral Daily Orpah Cobb, MD        Medications Prior to Admission  Medication Sig Dispense Refill Last Dose   acetaminophen (TYLENOL) 500 MG tablet Take 1 tablet (500 mg total) by mouth every 6 (six) hours as needed. 30 tablet 0    azelastine (ASTELIN) 0.1 % nasal spray Place 2 sprays into both nostrils 2 (two) times daily. Use in each nostril as directed 30 mL 12    citalopram (CELEXA) 20 MG tablet Take 1 tablet (20 mg total) by mouth daily. 90 tablet 1    levocetirizine (XYZAL) 5 MG tablet Take 5 mg by mouth daily.      Melatonin 10 MG TABS Take 10 mg by mouth at bedtime as needed (sleep).      Multiple Vitamin (MULTIVITAMIN) tablet Take 1 tablet by mouth daily.      Naphazoline-Pheniramine (ALLERGY EYE OP) Place 1-2 drops into both eyes 3 (three) times daily as needed (for seasonal allergy/itchy eyes).      omeprazole (PRILOSEC) 40 MG capsule Take 1 capsule  (40 mg total) by mouth daily. 90 capsule 1    simvastatin (ZOCOR) 20 MG tablet Take 0.5 tablets (10 mg total) by mouth daily. 90 tablet 1     Family History  Problem Relation Age of Onset   Colon cancer Brother 30   Throat cancer Brother    Breast cancer Mother      Review of Systems:       Cardiac Review of Systems: Y or  [    ]= no  Chest Pain [  yes, now resolved  ]  Resting SOB [   ] Exertional SOB  [  x]  Orthopnea [  ]   Pedal Edema [   ]    Palpitations [  ] Syncope  [  ]   Presyncope [   ]  General Review of Systems: [Y] = yes [  ]=no Constitional: recent weight change [  ]; anorexia [  ]; fatigue [  ]; nausea [  ]; night sweats [  ]; fever [  ]; or chills [  ]                                                               Dental: Last Dentist visit: over 6 months ago  Eye : blurred vision [  ]; diplopia [   ];  vision changes [  ];  Amaurosis fugax[  ]; Resp: cough [  ];  wheezing[  ];  hemoptysis[  ]; shortness of breath[x  ]; paroxysmal nocturnal dyspnea[  ]; dyspnea on exertion[  ]; or orthopnea[  ];  GI:  gallstones[  ], vomiting[  ];  dysphagia[  ]; melena[  ];  hematochezia [  ]; heartburn[  ];   Hx of  Colonoscopy[  ]; GU: kidney stones [  ]; hematuria[  ];   dysuria [  ];  nocturia[  ];  history of     obstruction [  ]; urinary frequency [  ]             Skin: rash, swelling[  ];, hair loss[  ];  peripheral edema[  ];  or itching[  ]; Musculosketetal: myalgias[  ];  joint swelling[  ];  joint erythema[  ];  joint pain[  ];  back pain[  ];  Heme/Lymph: bruising[  ];  bleeding[  ];  anemia[  ];  Neuro: TIA[  ];  headaches[  ];  stroke[  ];  vertigo[  ];  seizures[  ];   paresthesias[  ];  difficulty walking[  ];  Psych:depression[ x ]; anxiety[ x ];  Endocrine: diabetes[  ];  thyroid dysfunction[  ];                  Physical Exam: BP (!) 158/88 (BP Location: Left Arm)   Pulse 72   Temp 98.2 F (36.8 C) (Oral)   Resp 18   Ht 5' 10.5" (1.791 m)   Wt 94.7 kg    SpO2 99%   BMI 29.53 kg/m    General appearance: alert, cooperative, and no distress Head: Normocephalic, without obvious abnormality, atraumatic Neck: no adenopathy, no carotid bruit, no JVD, and supple, symmetrical, trachea midline Lymph nodes: no cervical or clavicular adenopathy Resp: clear to auscultation bilaterally Cardio: RRR, grade 4/6 systolic murmur GI: soft, active bowel sounds. No tenderness Extremities: no obvious deformities, all well perfused with palpable distel pulses Neurologic: Grossly normal  Diagnostic Studies & Laboratory data:    Procedures  RIGHT HEART CATH AND CORONARY ANGIOGRAPHY  RIGHT/LEFT HEART CATH AND CORONARY ANGIOGRAPHY   Conclusion      Prox LAD to Mid LAD lesion is 30% stenosed.   Prox RCA lesion is 70% stenosed.   LV end diastolic pressure is normal.   Hemodynamic findings consistent with aortic valve stenosis.   There is severe aortic valve stenosis. There is mild (2+) aortic regurgitation.   Recommend dual antiplatelet therapy with Aspirin 81mg  daily and Clopidogrel 75mg  daily.   CVTS/TVAR consult + RCA angioplasty to follow AV replacement.   Recommendations  Antiplatelet/Anticoag Recommend dual antiplatelet therapy with Aspirin 81mg  daily and Clopidogrel 75mg  daily.   Indications  Nonrheumatic aortic valve stenosis [I35.0 (ICD-10-CM)]  Atherosclerosis of native coronary artery of native heart with unstable angina pectoris (HCC) [I25.110 (ICD-10-CM)]   Clinical Presentation  CHF/Shock Congestive heart failure not present. No shock present.   Procedural Details  Technical Details PROCEDURE:  Right and Left heart catheterization with selective coronary angiography, left ventriculogram.  CLINICAL HISTORY:  This is a 72 years old white male with chest pain and elevated troponin I has HLD and atrial fibrillation. S/P ablation..  The risks, benefits, and details of the procedure were explained to the patient.  The patient  verbalized understanding and wanted to proceed.  Informed written consent was obtained.  PROCEDURE TECHNIQUE:  The  patient was approached from the right femoral vein using 7 French short sheath and right femoral artery using a 5 French short sheath. Right heart pressures, cardiac output and oxygen saturation were measured using a Swan Ganz catheter placed through right femoral vein after the left heart catheterization. Left coronary angiography was done using a Judkins L4 guide catheter followed by Amplatz 1.  Right coronary angiography was done using a Judkins R4 guide catheter. Left ventriculography was done using a JR 4 catheter. He had normal cardiac output but 37.5 mm mean aortic valve gradient and valve area of 1.04 cm2 suggestive of severe AS.   CONTRAST:  Total of 95 cc.  Total Sedation time: I was present -  Minutes face to face 100 % of the time.  Estimated blood loss <50 mL.   During this procedure medications were administered to achieve and maintain moderate conscious sedation while the patient's heart rate, blood pressure, and oxygen saturation were continuously monitored and I was present face-to-face 100% of this time.    Coronary Findings  Diagnostic Dominance: Co-dominant Left Main  Vessel was injected. Vessel is normal in caliber. Vessel is angiographically normal.    Left Anterior Descending  Vessel was injected. Vessel is normal in caliber. There is mild focal disease in the vessel.  Prox LAD to Mid LAD lesion is 30% stenosed. Vessel is not the culprit lesion. The lesion is type A, focal and concentric. The lesion was not previously treated . The stenosis was measured by a visual reading. Pressure gradient was not performed on the lesion. IVUS was not performed. No optical coherence tomography (OCT) was performed.    Ramus Intermedius  Vessel was injected. Vessel is large. Vessel is angiographically normal.    Left Circumflex  Vessel was injected. Vessel is moderate in  size. Vessel is angiographically normal.    Right Coronary Artery  Vessel was injected. Vessel is moderate in size. There is severe focal disease in the vessel.  Prox RCA lesion is 70% stenosed. Vessel is the culprit lesion. The lesion is type C, concentric and irregular. The lesion was not previously treated . The stenosis was measured by a visual reading. Pressure gradient was not performed on the lesion. IVUS was not performed. No optical coherence tomography (OCT) was performed.    Intervention   No interventions have been documented.   Right Heart  Right Heart Pressures Hemodynamic findings consistent with aortic valve stenosis. LV EDP is normal.  Right Atrium The right atrial size is normal. Right atrial pressure is normal.  Right Ventricle The right ventricular size is normal.   Left Heart  Aortic Valve There is severe aortic valve stenosis. There is mild (2+) aortic regurgitation. The aortic valve is calcified. There is restricted aortic valve motion.       ECHOCARDIOGRAM REPORT       Patient Name:   RAZ VANDIEST Date of Exam: 10/03/2022 Medical Rec #:  [id number]        Height:       70.5 in Accession #:    [id number]       Weight:       208.8 lb Date of Birth:  1950/09/20        BSA:          2.137 m Patient Age:    72 years         BP:           180/85 mmHg Patient Gender: M  HR:           64 bpm. Exam Location:  Inpatient  Procedure: 2D Echo, Cardiac Doppler and Color Doppler  Indications:    Acute Myocardial infarction   History:        Patient has prior history of Echocardiogram examinations, most                 recent 07/19/2019. Aortic Valve Disease, Arrythmias:Atrial                 Fibrillation; Risk Factors:Dyslipidemia.   Sonographer:    Raeford Razor Referring Phys: [phone] [facility]    Sonographer Comments: Technically difficult study due to poor echo windows and no parasternal window. Image acquisition challenging due to patient body  habitus. IMPRESSIONS    1. Left ventricular ejection fraction, by estimation, is 55 to 60%. The left ventricle has normal function. The left ventricle has no regional wall motion abnormalities. Left ventricular diastolic parameters are consistent with Grade I diastolic dysfunction (impaired relaxation).  2. Right ventricular systolic function is normal. The right ventricular size is normal.  3. Left atrial size was moderately dilated.  4. Right atrial size was mildly dilated.  5. The mitral valve is normal in structure. Trivial mitral valve regurgitation.  6. The aortic valve is calcified. There is severe calcifcation of the aortic valve. There is moderate thickening of the aortic valve. Aortic valve regurgitation is mild. Severe aortic valve stenosis.  7. There is mild (Grade II) atheroma plaque involving the aortic root and ascending aorta.  8. The inferior vena cava is dilated in size with >50% respiratory variability, suggesting right atrial pressure of 8 mmHg.  Conclusion(s)/Recommendation(s): Findings consistent with severe valvular heart disease.  FINDINGS  Left Ventricle: Left ventricular ejection fraction, by estimation, is 55 to 60%. The left ventricle has normal function. The left ventricle has no regional wall motion abnormalities. The left ventricular internal cavity size was normal in size. There is  borderline left ventricular hypertrophy. Left ventricular diastolic parameters are consistent with Grade I diastolic dysfunction (impaired relaxation).  Right Ventricle: The right ventricular size is normal. No increase in right ventricular wall thickness. Right ventricular systolic function is normal.  Left Atrium: Left atrial size was moderately dilated.  Right Atrium: Right atrial size was mildly dilated.  Pericardium: There is no evidence of pericardial effusion.  Mitral Valve: The mitral valve is normal in structure. Trivial mitral valve  regurgitation.  Tricuspid Valve: The tricuspid valve is grossly normal. Tricuspid valve regurgitation is trivial.  Aortic Valve: The aortic valve is calcified. There is severe calcifcation of the aortic valve. There is moderate thickening of the aortic valve. There is moderate aortic valve annular calcification. Aortic valve regurgitation is mild. Severe aortic stenosis is present. Aortic valve mean gradient measures 33.0 mmHg. Aortic valve peak gradient measures 60.8 mmHg.  Pulmonic Valve: The pulmonic valve was normal in structure. Pulmonic valve regurgitation is not visualized.  Aorta: The aortic root is normal in size and structure. There is mild (Grade II) atheroma plaque involving the aortic root and ascending aorta.  Venous: The inferior vena cava is dilated in size with greater than 50% respiratory variability, suggesting right atrial pressure of 8 mmHg.  IAS/Shunts: The atrial septum is grossly normal.      LV Volumes (MOD) LV vol d, MOD A2C: 132.0 ml Diastology LV vol d, MOD A4C: 192.0 ml LV e' medial:    6.99 cm/s LV vol s, MOD A2C: 62.6 ml  LV E/e' medial:  11.0 LV vol s, MOD A4C: 91.5 ml  LV e' lateral:   7.93 cm/s LV SV MOD A2C:     69.4 ml  LV E/e' lateral: 9.7 LV SV MOD A4C:     192.0 ml LV SV MOD BP:      88.9 ml  RIGHT VENTRICLE            IVC RV S prime:     9.17 cm/s  IVC diam: 2.50 cm TAPSE (M-mode): 2.6 cm  LEFT ATRIUM             Index        RIGHT ATRIUM           Index LA Vol (A2C):   59.5 ml 27.84 ml/m  RA Area:     13.70 cm LA Vol (A4C):   73.6 ml 34.44 ml/m  RA Volume:   33.50 ml  15.68 ml/m LA Biplane Vol: 68.7 ml 32.15 ml/m  AORTIC VALVE AV Vmax:           390.00 cm/s AV Vmean:          267.000 cm/s AV VTI:            0.882 m AV Peak Grad:      60.8 mmHg AV Mean Grad:      33.0 mmHg LVOT Vmax:         103.00 cm/s LVOT Vmean:        64.900 cm/s LVOT VTI:          0.218 m LVOT/AV VTI ratio: 0.25  MITRAL VALVE MV Area (PHT): 2.99  cm     SHUNTS MV Decel Time: 254 msec     Systemic VTI: 0.22 m MV E velocity: 77.10 cm/s MV A velocity: 104.00 cm/s MV E/A ratio:  0.74  Orpah Cobb MD Electronically signed by Orpah Cobb MD Signature Date/Time: 10/03/2022/11:35:40 AM       Final     Imaging Info    Recent Radiology Findings:    DG Chest 2 View  Result Date: 10/02/2022 CLINICAL DATA:  Chest pain and pressure in the center of chest EXAM: CHEST - 2 VIEW COMPARISON:  Radiographs 07/15/2021 FINDINGS: Stable cardiomediastinal silhouette. Aortic atherosclerotic calcification. Left basilar scarring. No focal consolidation, pleural effusion, or pneumothorax. No displaced rib fractures. Remote left rib fractures. IMPRESSION: No active cardiopulmonary disease. Electronically Signed   By: Minerva Fester M.D.   On: 10/02/2022 19:05     I have independently reviewed the above radiologic studies and discussed with the patient   Recent Lab Findings: Lab Results  Component Value Date   WBC 7.8 10/03/2022   HGB 14.9 10/03/2022   HCT 44.5 10/03/2022   PLT 204 10/03/2022   GLUCOSE 99 10/03/2022   CHOL 186 10/03/2022   TRIG 75 10/03/2022   HDL 47 10/03/2022   LDLCALC 124 (H) 10/03/2022   ALT 27 10/02/2022   AST 23 10/02/2022   NA 138 10/03/2022   K 4.2 10/03/2022   CL 106 10/03/2022   CREATININE 0.93 10/03/2022   BUN 14 10/03/2022   CO2 22 10/03/2022   TSH 1.520 02/24/2017   INR 1.0 11/08/2020      Assessment / Plan:      -Single-vessel coronary artery disease presenting with NSTEMI and severe aortic stenosis that has progressed significantly in the past 3 years. There is also significant aortic valvular calcification.  He remains fairly active.  He is stable and not  having chest pain or shortness of breath at rest. Expect he would gain the most long-term benefit from surgical valve replacement and CABG over TAVR and PCI.   Both options were discussed with him today and he is leaning toward SAVR / CABG if offered  after evaluation by Dr. Laneta Simmers.   He has some unknown dental issues that he said were going to be addressed but have not yet been done. Will obtain an orthopantogram.     -History of atrial fibrillation- s/p ablation by Dr. Elberta Fortis in 2018 with successful conversion to SR. He is not anticoagulated currently.   -Dyslipidemia- on Lipitor  -Tobacco abuse- current smoker of both cigars and cigarettes.   -History of anxiety and depression- on Celexa prior to admission.  Dr. Laneta Simmers will see Mr. Stimmel later today and discuss options for treatment and timing of procedures.    I  spent 25 minutes counseling the patient face to face.   Leary Roca, PA-C  10/03/2022 11:05 AM   Chart reviewed, patient examined, agree with above. He has severe aortic stenosis and single vessel CAD with a focal 70% hazy lesion in the mid RCA. He presented with angina and ruled in for NSTEMI with troponin of 61>485.  He has been free of symptoms since admission. He denies any hx of fatigue or SOB before this recent anginal episode but has noted feeling funny when he bends over to pick things up. I think this can be treated either with CABG/AVR or PCI/TAVR with expected good long term results. I discussed both procedures with him and wife and the workup for TAVR. I would not be able to do AVR/CABG until next Friday due to full OR schedule. If he decides on open surgery he could go home and come back. If he decides on PCI/TAVR he could potentially have PCI Monday and go home with output TAVR workup. He is going to think about it tonight with his family.

## 2022-10-03 NOTE — Interval H&P Note (Signed)
History and Physical Interval Note:  10/03/2022 7:47 AM  Jon Jackson  has presented today for surgery, with the diagnosis of NSTEMI.  The various methods of treatment have been discussed with the patient and family. After consideration of risks, benefits and other options for treatment, the patient has consented to  Procedure(s): LEFT HEART CATH AND CORONARY ANGIOGRAPHY (N/A) as a surgical intervention.  The patient's history has been reviewed, patient examined, no change in status, stable for surgery.  I have reviewed the patient's chart and labs.  Questions were answered to the patient's satisfaction.     [name]

## 2022-10-03 NOTE — Progress Notes (Signed)
Echocardiogram 2D Echocardiogram has been performed.  Jon Jackson 10/03/2022, 11:24 AM

## 2022-10-03 NOTE — Consult Note (Addendum)
[facility]   MULTIDISCIPLINARY HEART VALVE TEAM  Cardiology Consultation:   Patient ID: Jon Jackson MRN: [id number]; DOB: 01/26/1951  Admit date: 10/02/2022 Date of Consult: 10/03/2022  Primary Care Provider: Tommie Sams, DO [facility] Cardiologist: Dr. Algie Coffer, MD  [facility] Electrophysiologist:  Will Jorja Loa, MD   Patient Profile:   Jon Jackson is a 72 y.o. male with a hx of persistent atrial fibrillation s/p AF ablation 2018 (no longer on [employer]), ongoing tobacco use, HLD, and anxiety who is being seen today for the evaluation of CAD/severe aortic stenosis at the request of Dr. Algie Coffer.  History of Present Illness:   Jon Jackson is a 72yo who lives in [place] with his wife. He is very functional at baseline and is able to care for several acres of land without any issues such as chest pain, SOB, or dizziness. He is a long time smoking; smoking approximately 1/2 pk/day. He follows regularly with his PCP. He was initially told that he had a heart murmur after being seen for a pre-op visit prior to shoulder surgery back in 2018. At that time, he was found to have atrial fibrillation and was referred to Dr. Estill Dooms for evaluation. He ultimately underwent successful AF ablation 07/22/2016. He did well with this and has since been taken off his anticoagulation given no recurrence of breakthrough arrhthymias. An echocardiogram from 06/2019 showed normal LVEF at 60-65% with moderate AV disease with a mean gradient at , peak 33.35mmhg, and AVA by VTI at 1.60cm2. He has not had cardiac imaging since that time.   He was most recently seen by Dr. Elberta Fortis 01/01/2022 at which time he was noted to be doing very well with no symptoms of severe AS. Plan was for 1 year follow up. Unfortunately he then presented to [facility] 7/4 with substernal chest pain with left arm radiation and diaphoresis. He had just taken care of his pets and was on his way to sit down to watch  television. He called EMS and was brought to [facility] for further evaluation.   Initial HsT 61>>485. EKG showed NSR with IVCD. CXR with no acute abnormalities.R/LHC today showed moderate pLAD and mALD disease with 30% lesion and significant pRCA lesion at 70%. Hemodynamic findings felt consistent with severe aortic valve stenosis and mild aortic regurgitation. He was started on DAPT with ASA 81mg  and Plavix 75mg .   Structural Heart team and TCTS were consulted for for consideration of PCI/TAVR versus CABG/SAVR. Echocardiogram today shows preserved LV function at 55-60% with G1DD, trivial MR, and severe aortic stenosis with grade II atheroma plaque involving the aortic root and ascending aorta. Mean gradient at , peak 60.65mmHg, with an AVA by VTI at 0.88cm2, and DVI 0.25.   On evaluation today, he is stable with no chest pain or other acute symptoms. Long discussion with patient and family regarding potential PCI/TAVR versus CABG/AVR.   Past Medical History:  Diagnosis Date   Anxiety    Arthritis    Atrial fibrillation (HCC)    Basal cell carcinoma    Depression    Dysrhythmia    GERD (gastroesophageal reflux disease)    Hyperlipidemia    Squamous carcinoma    Past Surgical History:  Procedure Laterality Date   ATRIAL FIBRILLATION ABLATION  07/22/2016   ATRIAL FIBRILLATION ABLATION N/A 07/22/2016   Procedure: Atrial Fibrillation Ablation;  Surgeon: Will Jorja Loa, MD;  Location: [facility] INVASIVE CV LAB;  Service: Cardiovascular;  Laterality: N/A;   BASAL  CELL CARCINOMA EXCISION     forehead; top of scalp   BIOPSY  05/27/2017   Procedure: BIOPSY;  Surgeon: Corbin Ade, MD;  Location: [facility] ENDO SUITE;  Service: Endoscopy;;  right colon, left colon;   CARDIOVERSION N/A 04/07/2016   Procedure: CARDIOVERSION;  Surgeon: Jonelle Sidle, MD;  Location: [facility] ORS;  Service: Cardiovascular;  Laterality: N/A;   COLECTOMY  1990s   removal of section due to strangulated bowel.   COLONOSCOPY N/A  05/30/2013   Procedure: COLONOSCOPY;  Surgeon: Corbin Ade, MD;  Location: [facility] ENDO SUITE;  Service: Endoscopy;  Laterality: N/A;  8:45 AM   COLONOSCOPY     COLONOSCOPY N/A 05/27/2017   Procedure: COLONOSCOPY;  Surgeon: Corbin Ade, MD;  Location: [facility] ENDO SUITE;  Service: Endoscopy;  Laterality: N/A;  1:00pm   INGUINAL HERNIA REPAIR Right 1990s   LUMBAR LAMINECTOMY/DECOMPRESSION MICRODISCECTOMY N/A 12/29/2018   Procedure: Decompressive lumbar laminectomy at L3-L4;  Surgeon: Ranee Gosselin, MD;  Location: [facility] ORS;  Service: Orthopedics;  Laterality: N/A;    SHOULDER ARTHROSCOPY WITH ROTATOR CUFF REPAIR AND SUBACROMIAL DECOMPRESSION Left 05/15/2016   Procedure: LEFT SHOULDER ARTHROSCOPY WITH ROTATOR CUFF REPAIR AND SUBACROMIAL DECOMPRESSION AND DISTAL CLAVICLE RESECTION;  Surgeon: Francena Hanly, MD;  Location: [facility] OR;  Service: Orthopedics;  Laterality: Left;  requests 2hrs   SHOULDER SURGERY Right    SQUAMOUS CELL CARCINOMA EXCISION     scalp   TONSILLECTOMY       Home Medications:  Prior to Admission medications   Medication Sig Start Date End Date Taking? Authorizing Provider  acetaminophen (TYLENOL) 500 MG tablet Take 1 tablet (500 mg total) by mouth every 6 (six) hours as needed. 04/24/20   Avegno, Zachery Dakins, FNP  azelastine (ASTELIN) 0.1 % nasal spray Place 2 sprays into both nostrils 2 (two) times daily. Use in each nostril as directed 07/15/21   Tommie Sams, DO  citalopram (CELEXA) 20 MG tablet Take 1 tablet (20 mg total) by mouth daily. 07/10/22   Tommie Sams, DO  levocetirizine (XYZAL) 5 MG tablet Take 5 mg by mouth daily.    [provider]  Melatonin 10 MG TABS Take 10 mg by mouth at bedtime as needed (sleep).    [provider]  Multiple Vitamin (MULTIVITAMIN) tablet Take 1 tablet by mouth daily.    [provider]  Naphazoline-Pheniramine (ALLERGY EYE OP) Place 1-2 drops into both eyes 3 (three) times daily as needed (for seasonal allergy/itchy  eyes).    [provider]  omeprazole (PRILOSEC) 40 MG capsule Take 1 capsule (40 mg total) by mouth daily. 07/10/22   Tommie Sams, DO  simvastatin (ZOCOR) 20 MG tablet Take 0.5 tablets (10 mg total) by mouth daily. 07/10/22   Tommie Sams, DO    Inpatient Medications: Scheduled Meds:  aspirin EC  81 mg Oral Daily   atorvastatin  40 mg Oral Daily   citalopram  20 mg Oral Daily   heparin  5,000 Units Subcutaneous Q8H   metoprolol tartrate  12.5 mg Oral BID   pantoprazole  40 mg Oral Daily   Continuous Infusions:  sodium chloride 30 mL/hr at 10/03/22 0100   sodium chloride 100 mL/hr at 10/03/22 1118   PRN Meds: acetaminophen, hydrALAZINE, labetalol, nitroGLYCERIN, ondansetron (ZOFRAN) IV  Allergies:    Allergies  Allergen Reactions   Ultram [Tramadol] Other (See Comments)    Head felt funny    Social History:   Social History  Socioeconomic History   Marital status: Married    Spouse name: Not on file   Number of children: Not on file   Years of education: Not on file   Highest education level: Not on file  Occupational History   Not on file  Tobacco Use   Smoking status: Some Days    Packs/day: 0.50    Years: 47.00    Additional pack years: 0.00    Total pack years: 23.50    Types: Cigars, Cigarettes   Smokeless tobacco: Never  Vaping Use   Vaping Use: Never used  Substance and Sexual Activity   Alcohol use: Yes    Alcohol/week: 12.0 standard drinks of alcohol    Types: 12 Cans of beer per week    Comment: daily 3-4 in the summer; soemtimes more on weekends.   Drug use: No   Sexual activity: Yes  Other Topics Concern   Not on file  Social History Narrative   Not on file   Social Determinants of Health   Financial Resource Strain: Low Risk  (05/09/2022)   Overall Financial Resource Strain (CARDIA)    Difficulty of Paying Living Expenses: Not hard at all  Food Insecurity: No Food Insecurity (10/02/2022)   Hunger Vital Sign    Worried About  Running Out of Food in the Last Year: Never true    Ran Out of Food in the Last Year: Never true  Transportation Needs: No Transportation Needs (10/02/2022)   PRAPARE - Administrator, Civil Service (Medical): No    Lack of Transportation (Non-Medical): No  Physical Activity: Sufficiently Active (05/09/2022)   Exercise Vital Sign    Days of Exercise per Week: 7 days    Minutes of Exercise per Session: 30 min  Stress: No Stress Concern Present (05/09/2022)   [employer] of Occupational Health - Occupational Stress Questionnaire    Feeling of Stress : Not at all  Social Connections: Moderately Isolated (10/02/2022)   Social Connection and Isolation Panel [NHANES]    Frequency of Communication with Friends and Family: More than three times a week    Frequency of Social Gatherings with Friends and Family: More than three times a week    Attends Religious Services: Never    Database administrator or Organizations: No    Attends Banker Meetings: Never    Marital Status: Married  Catering manager Violence: Not At Risk (10/02/2022)   Humiliation, Afraid, Rape, and Kick questionnaire    Fear of Current or Ex-Partner: No    Emotionally Abused: No    Physically Abused: No    Sexually Abused: No    Family History:    Family History  Problem Relation Age of Onset   Colon cancer Brother 28   Throat cancer Brother    Breast cancer Mother     ROS:  Please see the history of present illness.   All other ROS reviewed and negative.     Physical Exam/Data:   Vitals:   10/03/22 0909 10/03/22 0914 10/03/22 0926 10/03/22 1118  BP: (!) 178/90 (!) 178/90 (!) 158/88 (!) 159/67  Pulse: 74 (!) 0 72 64  Resp: 13  18   Temp:      TempSrc:      SpO2: 97%  99%   Weight:      Height:        Intake/Output Summary (Last 24 hours) at 10/03/2022 1207 Last data filed at 10/03/2022 0100 Gross per 24  hour  Intake 668.38 ml  Output --  Net 668.38 ml      10/02/2022   10:07 PM  10/02/2022    6:38 PM 07/10/2022   10:44 AM  Last 3 Weights  Weight (lbs) 208 lb 12.4 oz 205 lb 214 lb  Weight (kg) 94.7 kg 92.987 kg 97.07 kg     Body mass index is 29.53 kg/m.   General: Well developed, well nourished, NAD Lungs: Mild crackles in LLL, otherwise CTA. Breathing is unlabored. Cardiovascular: RRR with S1 S2. + systolic murmur Abdomen: Soft, non-tender, non-distended. No obvious abdominal masses. Extremities: No edema.  Neuro: Alert and oriented. No focal deficits. No facial asymmetry. MAE spontaneously. Psych: Responds to questions appropriately with normal affect.    EKG:  The EKG was personally reviewed and demonstrates:  NSR with IVCD and no acute changes.  Telemetry:  Telemetry was personally reviewed and demonstrates:  NSR  Cardiac Studies & Procedures   CARDIAC CATHETERIZATION  CARDIAC CATHETERIZATION 10/03/2022  Narrative   Prox LAD to Mid LAD lesion is 30% stenosed.   Prox RCA lesion is 70% stenosed.   LV end diastolic pressure is normal.   Hemodynamic findings consistent with aortic valve stenosis.   There is severe aortic valve stenosis. There is mild (2+) aortic regurgitation.   Recommend dual antiplatelet therapy with Aspirin 81mg  daily and Clopidogrel 75mg  daily.  CVTS/TVAR consult + RCA angioplasty to follow AV replacement.  Findings Coronary Findings Diagnostic  Dominance: Co-dominant  Left Main Vessel was injected. Vessel is normal in caliber. Vessel is angiographically normal.  Left Anterior Descending Vessel was injected. Vessel is normal in caliber. There is mild focal disease in the vessel. Prox LAD to Mid LAD lesion is 30% stenosed. Vessel is not the culprit lesion. The lesion is type A, focal and concentric. The lesion was not previously treated . The stenosis was measured by a visual reading. Pressure gradient was not performed on the lesion. IVUS was not performed. No optical coherence tomography (OCT) was performed.  Ramus  Intermedius Vessel was injected. Vessel is large. Vessel is angiographically normal.  Left Circumflex Vessel was injected. Vessel is moderate in size. Vessel is angiographically normal.  Right Coronary Artery Vessel was injected. Vessel is moderate in size. There is severe focal disease in the vessel. Prox RCA lesion is 70% stenosed. Vessel is the culprit lesion. The lesion is type C, concentric and irregular. The lesion was not previously treated . The stenosis was measured by a visual reading. Pressure gradient was not performed on the lesion. IVUS was not performed. No optical coherence tomography (OCT) was performed.  Intervention  No interventions have been documented.     ECHOCARDIOGRAM  ECHOCARDIOGRAM COMPLETE 10/03/2022  Narrative ECHOCARDIOGRAM REPORT    Patient Name:   DEANGLO TRUMBAUER Date of Exam: 10/03/2022 Medical Rec #:  [id number]        Height:       70.5 in Accession #:    [id number]       Weight:       208.8 lb Date of Birth:  14-Sep-1950        BSA:          2.137 m Patient Age:    72 years         BP:           180/85 mmHg Patient Gender: M                HR:  64 bpm. Exam Location:  Inpatient  Procedure: 2D Echo, Cardiac Doppler and Color Doppler  Indications:    Acute Myocardial infarction  History:        Patient has prior history of Echocardiogram examinations, most recent 07/19/2019. Aortic Valve Disease, Arrythmias:Atrial Fibrillation; Risk Factors:Dyslipidemia.  Sonographer:    Raeford Razor Referring Phys: [phone] [facility]   Sonographer Comments: Technically difficult study due to poor echo windows and no parasternal window. Image acquisition challenging due to patient body habitus. IMPRESSIONS   1. Left ventricular ejection fraction, by estimation, is 55 to 60%. The left ventricle has normal function. The left ventricle has no regional wall motion abnormalities. Left ventricular diastolic parameters are consistent with Grade I  diastolic dysfunction (impaired relaxation). 2. Right ventricular systolic function is normal. The right ventricular size is normal. 3. Left atrial size was moderately dilated. 4. Right atrial size was mildly dilated. 5. The mitral valve is normal in structure. Trivial mitral valve regurgitation. 6. The aortic valve is calcified. There is severe calcifcation of the aortic valve. There is moderate thickening of the aortic valve. Aortic valve regurgitation is mild. Severe aortic valve stenosis. 7. There is mild (Grade II) atheroma plaque involving the aortic root and ascending aorta. 8. The inferior vena cava is dilated in size with >50% respiratory variability, suggesting right atrial pressure of 8 mmHg.  Conclusion(s)/Recommendation(s): Findings consistent with severe valvular heart disease.  FINDINGS Left Ventricle: Left ventricular ejection fraction, by estimation, is 55 to 60%. The left ventricle has normal function. The left ventricle has no regional wall motion abnormalities. The left ventricular internal cavity size was normal in size. There is borderline left ventricular hypertrophy. Left ventricular diastolic parameters are consistent with Grade I diastolic dysfunction (impaired relaxation).  Right Ventricle: The right ventricular size is normal. No increase in right ventricular wall thickness. Right ventricular systolic function is normal.  Left Atrium: Left atrial size was moderately dilated.  Right Atrium: Right atrial size was mildly dilated.  Pericardium: There is no evidence of pericardial effusion.  Mitral Valve: The mitral valve is normal in structure. Trivial mitral valve regurgitation.  Tricuspid Valve: The tricuspid valve is grossly normal. Tricuspid valve regurgitation is trivial.  Aortic Valve: The aortic valve is calcified. There is severe calcifcation of the aortic valve. There is moderate thickening of the aortic valve. There is moderate aortic valve annular  calcification. Aortic valve regurgitation is mild. Severe aortic stenosis is present. Aortic valve mean gradient measures 33.0 mmHg. Aortic valve peak gradient measures 60.8 mmHg.  Pulmonic Valve: The pulmonic valve was normal in structure. Pulmonic valve regurgitation is not visualized.  Aorta: The aortic root is normal in size and structure. There is mild (Grade II) atheroma plaque involving the aortic root and ascending aorta.  Venous: The inferior vena cava is dilated in size with greater than 50% respiratory variability, suggesting right atrial pressure of 8 mmHg.  IAS/Shunts: The atrial septum is grossly normal.    LV Volumes (MOD) LV vol d, MOD A2C: 132.0 ml Diastology LV vol d, MOD A4C: 192.0 ml LV e' medial:    6.99 cm/s LV vol s, MOD A2C: 62.6 ml  LV E/e' medial:  11.0 LV vol s, MOD A4C: 91.5 ml  LV e' lateral:   7.93 cm/s LV SV MOD A2C:     69.4 ml  LV E/e' lateral: 9.7 LV SV MOD A4C:     192.0 ml LV SV MOD BP:      88.9 ml  RIGHT  VENTRICLE            IVC RV S prime:     9.17 cm/s  IVC diam: 2.50 cm TAPSE (M-mode): 2.6 cm  LEFT ATRIUM             Index        RIGHT ATRIUM           Index LA Vol (A2C):   59.5 ml 27.84 ml/m  RA Area:     13.70 cm LA Vol (A4C):   73.6 ml 34.44 ml/m  RA Volume:   33.50 ml  15.68 ml/m LA Biplane Vol: 68.7 ml 32.15 ml/m AORTIC VALVE AV Vmax:           390.00 cm/s AV Vmean:          267.000 cm/s AV VTI:            0.882 m AV Peak Grad:      60.8 mmHg AV Mean Grad:      33.0 mmHg LVOT Vmax:         103.00 cm/s LVOT Vmean:        64.900 cm/s LVOT VTI:          0.218 m LVOT/AV VTI ratio: 0.25  MITRAL VALVE MV Area (PHT): 2.99 cm     SHUNTS MV Decel Time: 254 msec     Systemic VTI: 0.22 m MV E velocity: 77.10 cm/s MV A velocity: 104.00 cm/s MV E/A ratio:  0.74  Orpah Cobb MD Electronically signed by Orpah Cobb MD Signature Date/Time: 10/03/2022/11:35:40 AM    Final             Laboratory Data:  High Sensitivity  Troponin:   Recent Labs  Lab 10/02/22 1848 10/02/22 2056  TROPONINIHS 61* 485*     Chemistry Recent Labs  Lab 10/02/22 1848 10/03/22 0710  NA 134* 138  K 4.2 4.2  CL 102 106  CO2 21* 22  GLUCOSE 95 99  BUN 15 14  CREATININE 0.94 0.93  CALCIUM 9.1 9.0  GFRNONAA >60 >60  ANIONGAP 11 10    Recent Labs  Lab 10/02/22 1846  PROT 7.0  ALBUMIN 4.1  AST 23  ALT 27  ALKPHOS 87  BILITOT 0.6   Hematology Recent Labs  Lab 10/02/22 1848 10/03/22 0710  WBC 6.7 7.8  RBC 4.70 4.71  HGB 15.2 14.9  HCT 44.4 44.5  MCV 94.5 94.5  MCH 32.3 31.6  MCHC 34.2 33.5  RDW 12.7 12.5  PLT 223 204   BNPNo results for input(s): "BNP", "PROBNP" in the last 168 hours.  DDimer No results for input(s): "DDIMER" in the last 168 hours.  Radiology/Studies:  ECHOCARDIOGRAM COMPLETE  Result Date: 10/03/2022    ECHOCARDIOGRAM REPORT   Patient Name:   DELVIN MARPLE Date of Exam: 10/03/2022 Medical Rec #:  [id number]        Height:       70.5 in Accession #:    [id number]       Weight:       208.8 lb Date of Birth:  05/11/1950        BSA:          2.137 m Patient Age:    72 years         BP:           180/85 mmHg Patient Gender: M                HR:  64 bpm. Exam Location:  Inpatient Procedure: 2D Echo, Cardiac Doppler and Color Doppler Indications:    Acute Myocardial infarction  History:        Patient has prior history of Echocardiogram examinations, most                 recent 07/19/2019. Aortic Valve Disease, Arrythmias:Atrial                 Fibrillation; Risk Factors:Dyslipidemia.  Sonographer:    Raeford Razor Referring Phys: [phone] [facility]  Sonographer Comments: Technically difficult study due to poor echo windows and no parasternal window. Image acquisition challenging due to patient body habitus. IMPRESSIONS  1. Left ventricular ejection fraction, by estimation, is 55 to 60%. The left ventricle has normal function. The left ventricle has no regional wall motion abnormalities. Left  ventricular diastolic parameters are consistent with Grade I diastolic dysfunction (impaired relaxation).  2. Right ventricular systolic function is normal. The right ventricular size is normal.  3. Left atrial size was moderately dilated.  4. Right atrial size was mildly dilated.  5. The mitral valve is normal in structure. Trivial mitral valve regurgitation.  6. The aortic valve is calcified. There is severe calcifcation of the aortic valve. There is moderate thickening of the aortic valve. Aortic valve regurgitation is mild. Severe aortic valve stenosis.  7. There is mild (Grade II) atheroma plaque involving the aortic root and ascending aorta.  8. The inferior vena cava is dilated in size with >50% respiratory variability, suggesting right atrial pressure of 8 mmHg. Conclusion(s)/Recommendation(s): Findings consistent with severe valvular heart disease. FINDINGS  Left Ventricle: Left ventricular ejection fraction, by estimation, is 55 to 60%. The left ventricle has normal function. The left ventricle has no regional wall motion abnormalities. The left ventricular internal cavity size was normal in size. There is  borderline left ventricular hypertrophy. Left ventricular diastolic parameters are consistent with Grade I diastolic dysfunction (impaired relaxation). Right Ventricle: The right ventricular size is normal. No increase in right ventricular wall thickness. Right ventricular systolic function is normal. Left Atrium: Left atrial size was moderately dilated. Right Atrium: Right atrial size was mildly dilated. Pericardium: There is no evidence of pericardial effusion. Mitral Valve: The mitral valve is normal in structure. Trivial mitral valve regurgitation. Tricuspid Valve: The tricuspid valve is grossly normal. Tricuspid valve regurgitation is trivial. Aortic Valve: The aortic valve is calcified. There is severe calcifcation of the aortic valve. There is moderate thickening of the aortic valve. There is  moderate aortic valve annular calcification. Aortic valve regurgitation is mild. Severe aortic stenosis is present. Aortic valve mean gradient measures 33.0 mmHg. Aortic valve peak gradient measures 60.8 mmHg. Pulmonic Valve: The pulmonic valve was normal in structure. Pulmonic valve regurgitation is not visualized. Aorta: The aortic root is normal in size and structure. There is mild (Grade II) atheroma plaque involving the aortic root and ascending aorta. Venous: The inferior vena cava is dilated in size with greater than 50% respiratory variability, suggesting right atrial pressure of 8 mmHg. IAS/Shunts: The atrial septum is grossly normal.   LV Volumes (MOD) LV vol d, MOD A2C: 132.0 ml Diastology LV vol d, MOD A4C: 192.0 ml LV e' medial:    6.99 cm/s LV vol s, MOD A2C: 62.6 ml  LV E/e' medial:  11.0 LV vol s, MOD A4C: 91.5 ml  LV e' lateral:   7.93 cm/s LV SV MOD A2C:     69.4 ml  LV E/e' lateral: 9.7  LV SV MOD A4C:     192.0 ml LV SV MOD BP:      88.9 ml RIGHT VENTRICLE            IVC RV S prime:     9.17 cm/s  IVC diam: 2.50 cm TAPSE (M-mode): 2.6 cm LEFT ATRIUM             Index        RIGHT ATRIUM           Index LA Vol (A2C):   59.5 ml 27.84 ml/m  RA Area:     13.70 cm LA Vol (A4C):   73.6 ml 34.44 ml/m  RA Volume:   33.50 ml  15.68 ml/m LA Biplane Vol: 68.7 ml 32.15 ml/m  AORTIC VALVE AV Vmax:           390.00 cm/s AV Vmean:          267.000 cm/s AV VTI:            0.882 m AV Peak Grad:      60.8 mmHg AV Mean Grad:      33.0 mmHg LVOT Vmax:         103.00 cm/s LVOT Vmean:        64.900 cm/s LVOT VTI:          0.218 m LVOT/AV VTI ratio: 0.25 MITRAL VALVE MV Area (PHT): 2.99 cm     SHUNTS MV Decel Time: 254 msec     Systemic VTI: 0.22 m MV E velocity: 77.10 cm/s MV A velocity: 104.00 cm/s MV E/A ratio:  0.74 Orpah Cobb MD Electronically signed by Orpah Cobb MD Signature Date/Time: 10/03/2022/11:35:40 AM    Final    CARDIAC CATHETERIZATION  Result Date: 10/03/2022   Prox LAD to Mid LAD lesion is  30% stenosed.   Prox RCA lesion is 70% stenosed.   LV end diastolic pressure is normal.   Hemodynamic findings consistent with aortic valve stenosis.   There is severe aortic valve stenosis. There is mild (2+) aortic regurgitation.   Recommend dual antiplatelet therapy with Aspirin 81mg  daily and Clopidogrel 75mg  daily. CVTS/TVAR consult + RCA angioplasty to follow AV replacement.   DG Chest 2 View  Result Date: 10/02/2022 CLINICAL DATA:  Chest pain and pressure in the center of chest EXAM: CHEST - 2 VIEW COMPARISON:  Radiographs 07/15/2021 FINDINGS: Stable cardiomediastinal silhouette. Aortic atherosclerotic calcification. Left basilar scarring. No focal consolidation, pleural effusion, or pneumothorax. No displaced rib fractures. Remote left rib fractures. IMPRESSION: No active cardiopulmonary disease. Electronically Signed   By: Minerva Fester M.D.   On: 10/02/2022 19:05    STS Risk Calculator: Procedure Type: Isolated AVR PERIOPERATIVE OUTCOME ESTIMATE % Operative Mortality 0.702% Morbidity & Mortality 4.34% Stroke 0.705% Renal Failure 0.537% Reoperation 2.85% Prolonged Ventilation 1.85% Deep Sternal Wound Infection 0.041% Long Hospital Stay (>14 days) 1.9% Short Hospital Stay (<6 days)* 60.6%  [facility] Cardiomyopathy Questionnaire     10/03/2022   12:14 PM  KCCQ-12  1 a. Ability to shower/bathe Not at all limited  1 b. Ability to walk 1 block Not at all limited  1 c. Ability to hurry/jog Slightly limited  2. Edema feet/ankles/legs Less than once a week  3. Limited by fatigue Never over the past 2 weeks  4. Limited by dyspnea Never over the past 2 weeks  5. Sitting up / on 3+ pillows Never over the past 2 weeks  6. Limited enjoyment of life Not limited at  all  7. Rest of life w/ symptoms Completely satisfied  8 a. Participation in hobbies Did not limit at all  8 b. Participation in chores Did not limit at all  8 c. Visiting family/friends Did not limit at all   Assessment  and Plan:   MITHRAN WHITSON is a 72 y.o. male with symptoms of severe, stage D1 severe aortic stenosis with NYHA Class II symptoms. I have reviewed the patient's recent echocardiogram which is notable for preserved LV systolic function and severe aortic stenosis with peak gradient of 60.59mmHg and mean transvalvular gradient of 33.57mmHg. The patient's dimensionless index is 0.25 and calculated aortic valve area is 0.88 cm. R/LHC today showed significant pRCA disease at 70% and moderate LAD disease.    I have reviewed the natural history of aortic stenosis with the patient. We have discussed the limitations of medical therapy and the poor prognosis associated with symptomatic aortic stenosis. We have reviewed potential treatment options, including palliative medical therapy, conventional surgical aortic valve replacement, and transcatheter aortic valve replacement. We discussed treatment options in the context of this patient's specific comorbid medical conditions.    The patient's predicted risk of mortality with conventional aortic valve replacement is 0.7% primarily based on CAD with recent NSTEMI. Other conditions include ongoing tobacco use. Given no other significant co-morbidities and good functional status at baseline, likely felt to be best served with CABG/AVR and if needed, he can be  considered for TAVR in the future. Both TAVR and SAVR options discussed with plan to be finalized pending formal cardiac surgical consultation. We discussed typical TAVR evaluation which will require a gated cardiac CTA and a CTA of the chest/abdomen/pelvis to evaluate both his cardiac anatomy and peripheral vasculature. If proceeding with TAVR, follow-up testing can be arranged in the outpatient setting. MD to follow with final recommendations.     For questions or updates, please contact [facility] Please consult [email or web address] for contact info under    Signed, Georgie Chard, NP  10/03/2022 12:07  PM   I have personally seen and examined this patient. I agree with the assessment and plan as outlined above. Mr. Stachurski is a 72 yo male with history of aortic stenosis, atrial fibrillation s/p AF ablation in 2018. He is no longer on anti-coagulation. He is followed in our office in the EP clinic. He is an active smoker and has a history of HLD. He was admitted with chest pain. Troponin elevated to 485. He was admitted by Dr. Algie Coffer. Cardiac cath today with severe mid RCA stenosis and non-obstructive disease in the left coronary system.  Invasive hemodynamics with AV mean gradient of 37 mmHg.  Echo today with normal LV function and severe AS (mean gradient 33 mmHg, DI 0.25). The valve is poorly visualized on the echo and no AVA is given.  Labs reviewed by me.  My exam: NAD, Harsh systolic murmur. Clear lungs. No LE edema Plan: Severe aortic stenosis and severe single vessel CAD. I would favor surgical consultation today. If he is felt to be a candidate for surgical AVR and bypass of the RCA, would proceed with that plan. If he is not felt to be a good candidate for surgery, I would recommend proceeding with PCI of the RCA Monday and then arrange pre-TAVR CT scans while moving forward with planning for TAVR.   Verne Carrow, MD, [facility] 10/03/2022 12:29 PM

## 2022-10-04 ENCOUNTER — Other Ambulatory Visit (HOSPITAL_COMMUNITY): Payer: Medicare Other

## 2022-10-04 LAB — CBC
HCT: 43.1 % (ref 39.0–52.0)
Hemoglobin: 14.5 g/dL (ref 13.0–17.0)
MCH: 32.2 pg (ref 26.0–34.0)
MCHC: 33.6 g/dL (ref 30.0–36.0)
MCV: 95.6 fL (ref 80.0–100.0)
Platelets: 186 10*3/uL (ref 150–400)
RBC: 4.51 MIL/uL (ref 4.22–5.81)
RDW: 12.5 % (ref 11.5–15.5)
WBC: 8.3 10*3/uL (ref 4.0–10.5)
nRBC: 0 % (ref 0.0–0.2)

## 2022-10-04 MED ORDER — METOPROLOL TARTRATE 25 MG PO TABS: 12.5000 mg | ORAL_TABLET | Freq: Two times a day (BID) | ORAL | 3 refills | Status: DC

## 2022-10-04 MED ORDER — ATORVASTATIN CALCIUM 40 MG PO TABS: 40.0000 mg | ORAL_TABLET | Freq: Every evening | ORAL | 3 refills | Status: DC

## 2022-10-04 NOTE — Progress Notes (Signed)
1 Day Post-Op Procedure(s) (LRB): RIGHT HEART CATH AND CORONARY ANGIOGRAPHY (N/A) RIGHT/LEFT HEART CATH AND CORONARY ANGIOGRAPHY (N/A) Subjective: No chest pain or SOB.   He has decided that he would like to proceed with open surgical AVR and CABG.  Objective: Vital signs in last 24 hours: Temp:  [97.4 F (36.3 C)-98.3 F (36.8 C)] 98.1 F (36.7 C) (07/06 0758) Pulse Rate:  [56-65] 65 (07/06 0536) Cardiac Rhythm: Normal sinus rhythm (07/06 0808) Resp:  [16-18] 18 (07/06 0758) BP: (137-160)/(67-86) 137/74 (07/06 0758) SpO2:  [94 %-97 %] 97 % (07/06 0536) Weight:  [91.7 kg] 91.7 kg (07/06 0536)  Hemodynamic parameters for last 24 hours:    Intake/Output from previous day: 07/05 0701 - 07/06 0700 In: 757 [I.V.:757] Out: 450 [Urine:450] Intake/Output this shift: No intake/output data recorded.  General appearance: alert and cooperative Heart: regular rate and rhythm, S1, S2 normal, 3/6 systolic murmur RSB. Lungs: clear to auscultation bilaterally  Lab Results: Recent Labs    10/03/22 0710 10/03/22 0850 10/03/22 0853 10/04/22 0219  WBC 7.8  --   --  8.3  HGB 14.9   < > 14.3 14.5  HCT 44.5   < > 42.0 43.1  PLT 204  --   --  186   < > = values in this interval not displayed.   BMET:  Recent Labs    10/02/22 1848 10/03/22 0710 10/03/22 0850 10/03/22 0853  NA 134* 138 138  138 139  K 4.2 4.2 4.2  4.2 4.2  CL 102 106  --   --   CO2 21* 22  --   --   GLUCOSE 95 99  --   --   BUN 15 14  --   --   CREATININE 0.94 0.93  --   --   CALCIUM 9.1 9.0  --   --     PT/INR: No results for input(s): "LABPROT", "INR" in the last 72 hours. ABG    Component Value Date/Time   PHART 7.393 10/03/2022 0853   HCO3 22.3 10/03/2022 0853   TCO2 23 10/03/2022 0853   ACIDBASEDEF 2.0 10/03/2022 0853   O2SAT 95 10/03/2022 0853   CBG (last 3)  No results for input(s): "GLUCAP" in the last 72 hours.  Assessment/Plan:  Severe aortic stenosis and single vessel CAD. Plan AVR and  CABG Friday next week. I think he can go home and return for surgery Friday am. Would continue ASA and give him SL NTG as needed. I think he should be fine as long as he stay inside and takes it easy. I will have my office arrange surgery and followup with his this week. He and wife are in agreement.  LOS: 2 days    Alleen Borne 10/04/2022

## 2022-10-04 NOTE — Progress Notes (Signed)
CARDIAC REHAB PHASE I   PRE:  Rate/Rhythm: 64 SR, 50s with occ junctional beat    BP: sitting 141/79    SpO2: 97 RA  MODE:  Ambulation: 400 ft   POST:  Rate/Rhythm: 83 SR, 48 SB with occasional  junctional beat upon sitting    BP: sitting 131/78     SpO2: 97 RA   Pt feeling well. Able to ambulate at slow pace. At rest he is having an occasional junctional beat with HR in 50s. Pt asx. Denies any CP. Pt's telemetry went completely out after walk and unable to get him back on monitor. NT and RN working on it.  Discussed with wife and pt IS (2250 ml), sternal precautions, mobility post op and d/c planning. Also discussed NTG use and smoking cessation. Pt receptive. Thinking about quitting smoking now. Moved out of bed with good mechanics. Wife supportive and will be with him at d/c. [phone]  Ethelda Chick BS, ACSM-CEP 10/04/2022 11:19 AM

## 2022-10-04 NOTE — Progress Notes (Signed)
VASCULAR LAB    Patient discharged before pre CABG Dopplers could be completed. Surgery scheduled for 7/12.    Isabele Lollar, RVT 10/04/2022, 1:41 PM

## 2022-10-04 NOTE — Discharge Summary (Signed)
Physician Discharge Summary  Patient ID: Jon Jackson MRN: [id number] DOB/AGE: 07/06/1950 72 y.o.  Admit date: 10/02/2022 Discharge date: 10/04/2022  Admission Diagnoses: NSTEMI HLD H/O atrial fibrillation, s/p ablation in 2018 Discharge Diagnoses:  Principal Problem:   NSTEMI (non-ST elevated myocardial infarction) [facility]) Active Problems:   Acute coronary syndrome (HCC) Severe aortic stenosis Severe RCA CAD S/P Lumbar Laminectomy/Decompression  Discharged Condition: fair  Hospital Course: 72 years old male presented with substernal chest pain radiating to left arm and sweating spell. He has PMH of Anxiety, arthritis, Atrial fibrillation with ablation in 2018 and now sinus rhythm and HLD. His troponin I levels were rising from 61 to 485 ng. CXR: Unremarkable. 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 Cardiac cath: Severe RCA proximal to mid vessel disease and severe aortic stenosis with calcific AV. Post cath right groin cath site remained stable without swelling or discharge. Echocardiogram showed normal LV systolic function and severe AS. Surgical consult and heart valve team were consulted. Patient and family preferred surgical valve replacement with one vessel bypass. This will be scheduled next week. He was discharged home in stable condition with instructions to refrain for heavy duty activity.  Consults: cardiology and CVTS  Significant Diagnostic Studies: labs: Normal CBC, BMET.  LDL cholesterol of 124 mg., HDL 47 mg. And Triglycerides of 75 mg.  Troponin I were 61 and 485 ng/L.  EKG: SR + IWMI, probably old  Echocardiogram: Normal LV systolic function and severe AS with mild AI.  R+ L heart cath: Normal right heart pressures, severe AS and severe RCA proximal to mid-vessel disease with preserved LV systolic function.  Treatments: cardiac meds: metoprolol and Atorvastatin.  Discharge Exam: Blood pressure (!) 146/70, pulse 65, temperature (!) 97.4 F (36.3 C), temperature source  Oral, resp. rate 18, height 5' 10.5" (1.791 m), weight 91.7 kg, SpO2 95 %. General appearance: alert, cooperative and appears stated age. Head: Normocephalic, atraumatic. Eyes: Light brown eyes, pink conjunctiva, corneas clear. Wears glasses.  Neck: No adenopathy, no carotid bruit, no JVD, supple, symmetrical, trachea midline and thyroid not enlarged. Resp: Clear to auscultation bilaterally. Cardio: Regular rate and rhythm, S1, S2 normal, II/VI systolic murmur, no click, rub or gallop. GI: Soft, non-tender; bowel sounds normal; no organomegaly. Extremities: No edema, cyanosis or clubbing. Right groin cath site is without swelling pain or discharge. Skin: Warm and dry.  Neurologic: Alert and oriented X 3, normal strength and tone. Normal coordination and gait.  Disposition: Discharge disposition: 01-Home or Self Care       Discharge Instructions     AMB referral to Phase II Cardiac Rehabilitation   Complete by: As directed    Diagnosis: NSTEMI   After initial evaluation and assessments completed: Virtual Based Care may be provided alone or in conjunction with Phase 2 Cardiac Rehab based on patient barriers.: Yes   Intensive Cardiac Rehabilitation (ICR) [facility] location only OR Traditional Cardiac Rehabilitation (TCR) *If criteria for ICR are not met will enroll in TCR [facility] only): Yes      Allergies as of 10/04/2022       Reactions   Ultram [tramadol] Other (See Comments)   Head felt funny        Medication List     STOP taking these medications    GOODY HEADACHE PO   simvastatin 20 MG tablet Commonly known as: ZOCOR       TAKE these medications    atorvastatin 40 MG tablet Commonly known as: LIPITOR Take 1 tablet (40 mg total)  by mouth every evening.   citalopram 20 MG tablet Commonly known as: CELEXA Take 1 tablet (20 mg total) by mouth daily.   levocetirizine 5 MG tablet Commonly known as: XYZAL Take 5 mg by mouth daily.   Melatonin 10 MG Tabs Take 10 mg  by mouth at bedtime as needed (sleep).   metoprolol tartrate 25 MG tablet Commonly known as: LOPRESSOR Take 0.5 tablets (12.5 mg total) by mouth 2 (two) times daily.   multivitamin tablet Take 1 tablet by mouth daily.   omeprazole 40 MG capsule Commonly known as: PRILOSEC Take 1 capsule (40 mg total) by mouth daily.        Follow-up Information     Tommie Sams, DO Follow up in 1 month(s).   Specialty: Family Medicine Contact information: [address] [place] [address] [phone]         Orpah Cobb, MD Follow up in 3 week(s).   Specialty: Cardiology Contact information: [address] [place] [address] [phone]         Alleen Borne, MD Follow up in 1 week(s).   Specialty: Cardiothoracic Surgery Why: as arranged Contact information: [address] [employer] [address] [place] [address] [phone]                 Time spent: Review of old chart, current chart, lab, x-ray, cardiac tests and discussion with patient over 60 minutes.  Signed: [name] 10/04/2022, 12:23 PM

## 2022-10-06 ENCOUNTER — Encounter (HOSPITAL_COMMUNITY): Payer: Self-pay | Admitting: Cardiovascular Disease

## 2022-10-06 ENCOUNTER — Other Ambulatory Visit: Payer: Self-pay | Admitting: *Deleted

## 2022-10-06 ENCOUNTER — Telehealth: Payer: Self-pay

## 2022-10-06 DIAGNOSIS — Z5181 Encounter for therapeutic drug level monitoring: Secondary | ICD-10-CM

## 2022-10-06 DIAGNOSIS — R7989 Other specified abnormal findings of blood chemistry: Secondary | ICD-10-CM

## 2022-10-06 DIAGNOSIS — I2510 Atherosclerotic heart disease of native coronary artery without angina pectoris: Secondary | ICD-10-CM

## 2022-10-06 DIAGNOSIS — I350 Nonrheumatic aortic (valve) stenosis: Secondary | ICD-10-CM

## 2022-10-06 NOTE — Transitions of Care (Post Inpatient/ED Visit) (Signed)
   10/06/2022  Name: Jon Jackson MRN: [id number] DOB: 09/03/1950  Today's TOC FU Call Status: Today's TOC FU Call Status:: Unsuccessul Call (1st Attempt) Unsuccessful Call (1st Attempt) Date: 10/06/22  Attempted to reach the patient regarding the most recent Inpatient/ED visit.  Follow Up Plan: Additional outreach attempts will be made to reach the patient to complete the Transitions of Care (Post Inpatient/ED visit) call.   Jodelle Gross, RN, BSN, CCM Care Management Coordinator [facility] Phone: [phone]/Fax: [phone]

## 2022-10-07 ENCOUNTER — Telehealth: Payer: Self-pay

## 2022-10-07 LAB — LIPOPROTEIN A (LPA): Lipoprotein (a): 214.3 nmol/L — ABNORMAL HIGH (ref <75.0)

## 2022-10-07 NOTE — Transitions of Care (Post Inpatient/ED Visit) (Signed)
   10/07/2022  Name: Jon Jackson MRN: [id number] DOB: 1950/12/05  Today's TOC FU Call Status: Today's TOC FU Call Status:: Successful TOC FU Call Competed TOC FU Call Complete Date: 10/07/22  Transition Care Management Follow-up Telephone Call Date of Discharge: 10/04/22 Discharge Facility: [facility]) Type of Discharge: Inpatient Admission Primary Inpatient Discharge Diagnosis:: NSTEMI How have you been since you were released from the hospital?: Better (Patient comments that he is sleeping a lot.  He is scheduled for a CABG and valve repair on 10/10/22.) Any questions or concerns?: No  Items Reviewed: Did you receive and understand the discharge instructions provided?: Yes Medications obtained,verified, and reconciled?: Yes (Medications Reviewed) Any new allergies since your discharge?: No Dietary orders reviewed?: No Do you have support at home?: Yes People in Home: spouse Name of Support/Comfort Primary Source: Toniann Fail  Medications Reviewed Today: Medications Reviewed Today     Reviewed by Jodelle Gross, RN (Case Manager) on 10/07/22 at 1052  Med List Status: <None>   Medication Order Taking? Sig Documenting Provider Last Dose Status Informant  atorvastatin (LIPITOR) 40 MG tablet [id number] Yes Take 1 tablet (40 mg total) by mouth every evening. Orpah Cobb, MD Taking Active   citalopram (CELEXA) 20 MG tablet [id number] Yes Take 1 tablet (20 mg total) by mouth daily. Tommie Sams, DO Taking Active Self, Pharmacy Records  levocetirizine (XYZAL) 5 MG tablet [id number] Yes Take 5 mg by mouth daily. [provider] Taking Active Self  Melatonin 10 MG TABS [id number] Yes Take 10 mg by mouth at bedtime as needed (sleep). [provider] Taking Active Self  metoprolol tartrate (LOPRESSOR) 25 MG tablet [id number] Yes Take 0.5 tablets (12.5 mg total) by mouth 2 (two) times daily. Orpah Cobb, MD Taking Active   Multiple Vitamin (MULTIVITAMIN) tablet [id number] Yes  Take 1 tablet by mouth daily. [provider] Taking Active Self  omeprazole (PRILOSEC) 40 MG capsule [id number] Yes Take 1 capsule (40 mg total) by mouth daily. Tommie Sams, DO Taking Active Self, Pharmacy Records            Home Care and Equipment/Supplies: Were Home Health Services Ordered?: No Any new equipment or medical supplies ordered?: No  Functional Questionnaire: Do you need assistance with bathing/showering or dressing?: No Do you need assistance with meal preparation?: No Do you need assistance with eating?: No Do you have difficulty maintaining continence: No Do you need assistance with getting out of bed/getting out of a chair/moving?: No Do you have difficulty managing or taking your medications?: No  Follow up appointments reviewed: PCP Follow-up appointment confirmed?: Yes Date of PCP follow-up appointment?: 10/17/22 Follow-up Provider: Dr. Adriana Simas [facility] Follow-up appointment confirmed?: NA (Patient going back to acute 10/10/22 for CABG and valve repair, will follow up with cardiology after surgery) Do you need transportation to your follow-up appointment?: No Do you understand care options if your condition(s) worsen?: Yes-patient verbalized understanding  SDOH Interventions Today    Flowsheet Row Most Recent Value  SDOH Interventions   Food Insecurity Interventions Intervention Not Indicated  Housing Interventions Intervention Not Indicated  Transportation Interventions Intervention Not Indicated      Jodelle Gross, RN, BSN, CCM Care Management Coordinator [facility] Phone: [phone]/Fax: [phone]

## 2022-10-08 NOTE — Progress Notes (Signed)
Surgical Instructions    Your procedure is scheduled on July 12.  Report to [facility] Main Entrance "A" at 5:30 A.M., then check in with the Admitting office.  Call this number if you have problems the morning of surgery:  [phone]   If you have any questions prior to your surgery date call [phone]: Open Monday-Friday 8am-4pm If you experience any cold or flu symptoms such as cough, fever, chills, shortness of breath, etc. between now and your scheduled surgery, please notify us at the above number     Remember:  Do not eat or drink anything after midnight the night before your surgery    Take these medicines the morning of surgery with A SIP OF WATER:  atorvastatin (LIPITOR)  citalopram (CELEXA)  levocetirizine (XYZAL)  metoprolol tartrate (LOPRESSOR)  omeprazole (PRILOSEC)   As of today, STOP taking any Aspirin (unless otherwise instructed by your surgeon) Aleve, Naproxen, Ibuprofen, Motrin, Advil, Goody's, BC's, all herbal medications, fish oil, and all vitamins.         Do not wear jewelry or makeup. Do not wear lotions, powders, perfumes/cologne or deodorant. Do not shave 48 hours prior to surgery.  Men may shave face and neck. Do not bring valuables to the hospital. Do not wear nail polish, gel polish, artificial nails, or any other type of covering on natural nails (fingers and toes) If you have artificial nails or gel coating that need to be removed by a nail salon, please have this removed prior to surgery. Artificial nails or gel coating may interfere with anesthesia's ability to adequately monitor your vital signs.  [employer] is not responsible for any belongings or valuables.    Do NOT Smoke (Tobacco/Vaping)  24 hours prior to your procedure  If you use a CPAP at night, you may bring your mask for your overnight stay.   Contacts, glasses, hearing aids, dentures or partials may not be worn into surgery, please bring cases for these belongings   For  patients admitted to the hospital, discharge time will be determined by your treatment team.   Patients discharged the day of surgery will not be allowed to drive home, and someone needs to stay with them for 24 hours.   SURGICAL WAITING ROOM VISITATION Patients having surgery or a procedure may have no more than 2 support people in the waiting area - these visitors may rotate.   Children under the age of 70 must have an adult with them who is not the patient. If the patient needs to stay at the hospital during part of their recovery, the visitor guidelines for inpatient rooms apply. Pre-op nurse will coordinate an appropriate time for 1 support person to accompany patient in pre-op.  This support person may not rotate.   Please refer to [email or web address] for the visitor guidelines for Inpatients (after your surgery is over and you are in a regular room).    Special instructions:    Oral Hygiene is also important to reduce your risk of infection.  Remember - BRUSH YOUR TEETH THE MORNING OF SURGERY WITH YOUR REGULAR TOOTHPASTE   [facility]- Preparing For Surgery  Before surgery, you can play an important role. Because skin is not sterile, your skin needs to be as free of germs as possible. You can reduce the number of germs on your skin by washing with CHG (chlorahexidine gluconate) Soap before surgery.  CHG is an antiseptic cleaner which kills germs and bonds with the skin to continue killing germs  even after washing.     Please do not use if you have an allergy to CHG or antibacterial soaps. If your skin becomes reddened/irritated stop using the CHG.  Do not shave (including legs and underarms) for at least 48 hours prior to first CHG shower. It is OK to shave your face.  Please follow these instructions carefully.     Shower the NIGHT BEFORE SURGERY and the MORNING OF SURGERY with CHG Soap.   If you chose to wash your hair, wash  your hair first as usual with your normal shampoo. After you shampoo, rinse your hair and body thoroughly to remove the shampoo.  Then [employer] and genitals (private parts) with your normal soap and rinse thoroughly to remove soap.  After that Use CHG Soap as you would any other liquid soap. You can apply CHG directly to the skin and wash gently with a scrungie or a clean washcloth.   Apply the CHG Soap to your body ONLY FROM THE NECK DOWN.  Do not use on open wounds or open sores. Avoid contact with your eyes, ears, mouth and genitals (private parts). Wash Face and genitals (private parts)  with your normal soap.   Wash thoroughly, paying special attention to the area where your surgery will be performed.  Thoroughly rinse your body with warm water from the neck down.  DO NOT shower/wash with your normal soap after using and rinsing off the CHG Soap.  Pat yourself dry with a CLEAN TOWEL.  Wear CLEAN PAJAMAS to bed the night before surgery  Place CLEAN SHEETS on your bed the night before your surgery  DO NOT SLEEP WITH PETS.   Day of Surgery:  Oral Hygiene is also important to reduce your risk of infection.  Remember - BRUSH YOUR TEETH THE MORNING OF SURGERY WITH YOUR REGULAR TOOTHPASTE  Take a shower with CHG soap. Wear Clean/Comfortable clothing the morning of surgery Do not apply any deodorants/lotions.   Remember to brush your teeth WITH YOUR REGULAR TOOTHPASTE.    If you received a COVID test during your pre-op visit, it is requested that you wear a mask when out in public, stay away from anyone that may not be feeling well, and notify your surgeon if you develop symptoms. If you have been in contact with anyone that has tested positive in the last 10 days, please notify your surgeon.    Please read over the following fact sheets that you were given.

## 2022-10-09 ENCOUNTER — Encounter (HOSPITAL_COMMUNITY): Payer: Self-pay

## 2022-10-09 ENCOUNTER — Ambulatory Visit (HOSPITAL_COMMUNITY)
Admission: RE | Admit: 2022-10-09 | Discharge: 2022-10-09 | Disposition: A | Discharge status: Home / Self Care | Payer: Medicare Other | Source: Ambulatory Visit | Attending: Surgery | Admitting: Surgery

## 2022-10-09 ENCOUNTER — Ambulatory Visit (HOSPITAL_BASED_OUTPATIENT_CLINIC_OR_DEPARTMENT_OTHER)
Admission: RE | Admit: 2022-10-09 | Discharge: 2022-10-09 | Disposition: A | Discharge status: Home / Self Care | Payer: Medicare Other | Source: Ambulatory Visit | Attending: Surgery | Admitting: Surgery

## 2022-10-09 ENCOUNTER — Encounter (HOSPITAL_COMMUNITY)
Admission: RE | Admit: 2022-10-09 | Discharge: 2022-10-09 | Disposition: A | Discharge status: Home / Self Care | Payer: Medicare Other | Source: Ambulatory Visit | Attending: Surgery | Admitting: Surgery

## 2022-10-09 ENCOUNTER — Other Ambulatory Visit: Payer: Self-pay

## 2022-10-09 ENCOUNTER — Encounter (HOSPITAL_COMMUNITY): Payer: Self-pay | Admitting: Vascular Surgery

## 2022-10-09 DIAGNOSIS — I10 Essential (primary) hypertension: Secondary | ICD-10-CM | POA: Insufficient documentation

## 2022-10-09 DIAGNOSIS — Z5181 Encounter for therapeutic drug level monitoring: Secondary | ICD-10-CM | POA: Insufficient documentation

## 2022-10-09 DIAGNOSIS — J90 Pleural effusion, not elsewhere classified: Secondary | ICD-10-CM | POA: Diagnosis not present

## 2022-10-09 DIAGNOSIS — Z800 Family history of malignant neoplasm of digestive organs: Secondary | ICD-10-CM | POA: Diagnosis not present

## 2022-10-09 DIAGNOSIS — Z1152 Encounter for screening for COVID-19: Secondary | ICD-10-CM | POA: Diagnosis not present

## 2022-10-09 DIAGNOSIS — Z48812 Encounter for surgical aftercare following surgery on the circulatory system: Secondary | ICD-10-CM | POA: Diagnosis not present

## 2022-10-09 DIAGNOSIS — E785 Hyperlipidemia, unspecified: Secondary | ICD-10-CM | POA: Diagnosis not present

## 2022-10-09 DIAGNOSIS — Z471 Aftercare following joint replacement surgery: Secondary | ICD-10-CM | POA: Diagnosis not present

## 2022-10-09 DIAGNOSIS — I350 Nonrheumatic aortic (valve) stenosis: Secondary | ICD-10-CM | POA: Insufficient documentation

## 2022-10-09 DIAGNOSIS — M1990 Unspecified osteoarthritis, unspecified site: Secondary | ICD-10-CM | POA: Diagnosis present

## 2022-10-09 DIAGNOSIS — Z888 Allergy status to other drugs, medicaments and biological substances status: Secondary | ICD-10-CM | POA: Diagnosis not present

## 2022-10-09 DIAGNOSIS — R918 Other nonspecific abnormal finding of lung field: Secondary | ICD-10-CM | POA: Diagnosis not present

## 2022-10-09 DIAGNOSIS — Z951 Presence of aortocoronary bypass graft: Secondary | ICD-10-CM | POA: Insufficient documentation

## 2022-10-09 DIAGNOSIS — D696 Thrombocytopenia, unspecified: Secondary | ICD-10-CM | POA: Diagnosis not present

## 2022-10-09 DIAGNOSIS — I2510 Atherosclerotic heart disease of native coronary artery without angina pectoris: Secondary | ICD-10-CM

## 2022-10-09 DIAGNOSIS — R0989 Other specified symptoms and signs involving the circulatory and respiratory systems: Secondary | ICD-10-CM | POA: Diagnosis not present

## 2022-10-09 DIAGNOSIS — F17200 Nicotine dependence, unspecified, uncomplicated: Secondary | ICD-10-CM | POA: Insufficient documentation

## 2022-10-09 DIAGNOSIS — K219 Gastro-esophageal reflux disease without esophagitis: Secondary | ICD-10-CM | POA: Diagnosis present

## 2022-10-09 DIAGNOSIS — F17210 Nicotine dependence, cigarettes, uncomplicated: Secondary | ICD-10-CM | POA: Diagnosis not present

## 2022-10-09 DIAGNOSIS — Z9049 Acquired absence of other specified parts of digestive tract: Secondary | ICD-10-CM | POA: Diagnosis not present

## 2022-10-09 DIAGNOSIS — F32A Depression, unspecified: Secondary | ICD-10-CM | POA: Diagnosis not present

## 2022-10-09 DIAGNOSIS — J309 Allergic rhinitis, unspecified: Secondary | ICD-10-CM | POA: Diagnosis not present

## 2022-10-09 DIAGNOSIS — Z981 Arthrodesis status: Secondary | ICD-10-CM | POA: Diagnosis not present

## 2022-10-09 DIAGNOSIS — I214 Non-ST elevation (NSTEMI) myocardial infarction: Secondary | ICD-10-CM | POA: Diagnosis not present

## 2022-10-09 DIAGNOSIS — J939 Pneumothorax, unspecified: Secondary | ICD-10-CM | POA: Diagnosis not present

## 2022-10-09 DIAGNOSIS — R7989 Other specified abnormal findings of blood chemistry: Secondary | ICD-10-CM | POA: Insufficient documentation

## 2022-10-09 DIAGNOSIS — I352 Nonrheumatic aortic (valve) stenosis with insufficiency: Secondary | ICD-10-CM | POA: Diagnosis not present

## 2022-10-09 DIAGNOSIS — I252 Old myocardial infarction: Secondary | ICD-10-CM | POA: Diagnosis not present

## 2022-10-09 DIAGNOSIS — J9811 Atelectasis: Secondary | ICD-10-CM | POA: Diagnosis not present

## 2022-10-09 DIAGNOSIS — Z85828 Personal history of other malignant neoplasm of skin: Secondary | ICD-10-CM | POA: Diagnosis not present

## 2022-10-09 DIAGNOSIS — F419 Anxiety disorder, unspecified: Secondary | ICD-10-CM | POA: Diagnosis not present

## 2022-10-09 DIAGNOSIS — I358 Other nonrheumatic aortic valve disorders: Secondary | ICD-10-CM | POA: Diagnosis not present

## 2022-10-09 DIAGNOSIS — Z79899 Other long term (current) drug therapy: Secondary | ICD-10-CM | POA: Diagnosis not present

## 2022-10-09 DIAGNOSIS — Z01818 Encounter for other preprocedural examination: Secondary | ICD-10-CM | POA: Diagnosis not present

## 2022-10-09 DIAGNOSIS — K5900 Constipation, unspecified: Secondary | ICD-10-CM | POA: Diagnosis not present

## 2022-10-09 DIAGNOSIS — I4891 Unspecified atrial fibrillation: Secondary | ICD-10-CM | POA: Diagnosis not present

## 2022-10-09 DIAGNOSIS — Z96612 Presence of left artificial shoulder joint: Secondary | ICD-10-CM | POA: Diagnosis present

## 2022-10-09 HISTORY — DX: Anemia, unspecified: D64.9

## 2022-10-09 HISTORY — DX: Atherosclerotic heart disease of native coronary artery without angina pectoris: I25.10

## 2022-10-09 HISTORY — DX: Cardiac murmur, unspecified: R01.1

## 2022-10-09 LAB — URINALYSIS, ROUTINE W REFLEX MICROSCOPIC
Glucose, UA: NEGATIVE mg/dL
Hgb urine dipstick: NEGATIVE
Ketones, ur: NEGATIVE mg/dL
Leukocytes,Ua: NEGATIVE
Nitrite: NEGATIVE
Protein, ur: NEGATIVE mg/dL
Specific Gravity, Urine: 1.017 (ref 1.005–1.030)
pH: 5 (ref 5.0–8.0)

## 2022-10-09 LAB — CBC
HCT: 44.2 % (ref 39.0–52.0)
Hemoglobin: 14.7 g/dL (ref 13.0–17.0)
MCH: 32.2 pg (ref 26.0–34.0)
MCHC: 33.3 g/dL (ref 30.0–36.0)
MCV: 96.9 fL (ref 80.0–100.0)
Platelets: 212 10*3/uL (ref 150–400)
RBC: 4.56 MIL/uL (ref 4.22–5.81)
RDW: 12.1 % (ref 11.5–15.5)
WBC: 8.2 10*3/uL (ref 4.0–10.5)
nRBC: 0 % (ref 0.0–0.2)

## 2022-10-09 LAB — COMPREHENSIVE METABOLIC PANEL
ALT: 26 U/L (ref 0–44)
AST: 19 U/L (ref 15–41)
Albumin: 3.6 g/dL (ref 3.5–5.0)
Alkaline Phosphatase: 95 U/L (ref 38–126)
Anion gap: 7 (ref 5–15)
BUN: 15 mg/dL (ref 8–23)
CO2: 21 mmol/L — ABNORMAL LOW (ref 22–32)
Calcium: 8.9 mg/dL (ref 8.9–10.3)
Chloride: 106 mmol/L (ref 98–111)
Creatinine, Ser: 0.82 mg/dL (ref 0.61–1.24)
GFR, Estimated: >60 mL/min (ref >60)
Glucose, Bld: 93 mg/dL (ref 70–99)
Potassium: 4.1 mmol/L (ref 3.5–5.1)
Sodium: 134 mmol/L — ABNORMAL LOW (ref 135–145)
Total Bilirubin: 0.7 mg/dL (ref 0.3–1.2)
Total Protein: 6.5 g/dL (ref 6.5–8.1)

## 2022-10-09 LAB — VAS US DOPPLER PRE CABG
Left ABI: 0.6
Right ABI: 0.96

## 2022-10-09 LAB — PROTIME-INR
INR: 1 (ref 0.8–1.2)
Prothrombin Time: 13.5 seconds (ref 11.4–15.2)

## 2022-10-09 LAB — TYPE AND SCREEN
ABO/RH(D): A NEG
Antibody Screen: NEGATIVE

## 2022-10-09 LAB — BLOOD GAS, ARTERIAL
Acid-base deficit: 0.3 mmol/L (ref 0.0–2.0)
Bicarbonate: 24.1 mmol/L (ref 20.0–28.0)
Drawn by: 58793
O2 Saturation: 99.4 %
Patient temperature: 37
pCO2 arterial: 38 mmHg (ref 32–48)
pH, Arterial: 7.41 (ref 7.35–7.45)
pO2, Arterial: 97 mmHg (ref 83–108)

## 2022-10-09 LAB — SURGICAL PCR SCREEN
MRSA, PCR: NEGATIVE
Staphylococcus aureus: NEGATIVE

## 2022-10-09 LAB — HEMOGLOBIN A1C
Hgb A1c MFr Bld: 5.2 % (ref 4.8–5.6)
Mean Plasma Glucose: 103 mg/dL

## 2022-10-09 LAB — APTT: aPTT: 27 seconds (ref 24–36)

## 2022-10-09 MED ORDER — CEFAZOLIN SODIUM-DEXTROSE 2-4 GM/100ML-% IV SOLN
2.0000 g | INTRAVENOUS | Status: AC
  Administered 2022-10-10 (×2): 2 g via INTRAVENOUS
  Filled 2022-10-09: qty 100

## 2022-10-09 MED ORDER — MILRINONE LACTATE IN DEXTROSE 20-5 MG/100ML-% IV SOLN
0.3000 ug/kg/min | INTRAVENOUS | Status: DC
  Filled 2022-10-09: qty 100

## 2022-10-09 MED ORDER — NITROGLYCERIN IN D5W 200-5 MCG/ML-% IV SOLN
2.0000 ug/min | INTRAVENOUS | Status: DC
  Filled 2022-10-09: qty 250

## 2022-10-09 MED ORDER — NOREPINEPHRINE 4 MG/250ML-% IV SOLN
0.0000 ug/min | INTRAVENOUS | Status: DC
  Filled 2022-10-09: qty 250

## 2022-10-09 MED ORDER — CEFAZOLIN SODIUM-DEXTROSE 2-4 GM/100ML-% IV SOLN
2.0000 g | INTRAVENOUS | Status: DC
  Filled 2022-10-09: qty 100

## 2022-10-09 MED ORDER — VANCOMYCIN HCL 1500 MG/300ML IV SOLN
1500.0000 mg | INTRAVENOUS | Status: AC
  Administered 2022-10-10: 1500 mg via INTRAVENOUS
  Filled 2022-10-09: qty 300

## 2022-10-09 MED ORDER — TRANEXAMIC ACID (OHS) PUMP PRIME SOLUTION
2.0000 mg/kg | INTRAVENOUS | Status: DC
  Filled 2022-10-09: qty 1.83

## 2022-10-09 MED ORDER — POTASSIUM CHLORIDE 2 MEQ/ML IV SOLN
80.0000 meq | INTRAVENOUS | Status: DC
  Filled 2022-10-09: qty 40

## 2022-10-09 MED ORDER — TRANEXAMIC ACID 1000 MG/10ML IV SOLN
1.5000 mg/kg/h | INTRAVENOUS | Status: AC
  Administered 2022-10-10: 1.5 mg/kg/h via INTRAVENOUS
  Filled 2022-10-09: qty 25

## 2022-10-09 MED ORDER — HEPARIN 30,000 UNITS/1000 ML (OHS) CELLSAVER SOLUTION
Status: DC
  Filled 2022-10-09: qty 1000

## 2022-10-09 MED ORDER — PHENYLEPHRINE HCL-NACL 20-0.9 MG/250ML-% IV SOLN
30.0000 ug/min | INTRAVENOUS | Status: AC
  Administered 2022-10-10: 30 ug/min via INTRAVENOUS
  Filled 2022-10-09: qty 250

## 2022-10-09 MED ORDER — DEXMEDETOMIDINE HCL IN NACL 400 MCG/100ML IV SOLN
0.1000 ug/kg/h | INTRAVENOUS | Status: AC
  Administered 2022-10-10: .3 ug/kg/h via INTRAVENOUS
  Filled 2022-10-09 (×2): qty 100

## 2022-10-09 MED ORDER — EPINEPHRINE HCL 5 MG/250ML IV SOLN IN NS
0.0000 ug/min | INTRAVENOUS | Status: DC
  Filled 2022-10-09: qty 250

## 2022-10-09 MED ORDER — PLASMA-LYTE A IV SOLN
INTRAVENOUS | Status: DC
  Filled 2022-10-09: qty 2.5

## 2022-10-09 MED ORDER — TRANEXAMIC ACID (OHS) BOLUS VIA INFUSION
15.0000 mg/kg | INTRAVENOUS | Status: AC
  Administered 2022-10-10: 1375.5 mg via INTRAVENOUS
  Filled 2022-10-09: qty 1376

## 2022-10-09 MED ORDER — INSULIN REGULAR(HUMAN) IN NACL 100-0.9 UT/100ML-% IV SOLN
INTRAVENOUS | Status: AC
  Administered 2022-10-10: .8 [IU]/h via INTRAVENOUS
  Filled 2022-10-09: qty 100

## 2022-10-09 MED ORDER — MANNITOL 20 % IV SOLN
INTRAVENOUS | Status: DC
  Filled 2022-10-09: qty 13

## 2022-10-09 NOTE — H&P (Signed)
[address]       [place] [address]             [phone]      Cardiothoracic Surgery Admission History and Physical   [facility] Medical Record #[id number] Date of Birth: 06/03/1950   Referring: [name], MD  Primary Care: Tommie Sams, DO Primary Cardiologist:None   Reason for Consult:  Severe aortic stenosis and single vessel coronary artery disease.     History of Present Illness:     [name] is a 72 year old gentleman with past history notable for atrial fibrillation status post ablation in 2018 with successful conversion to sinus rhythm.  He has a history of hypertension and dyslipidemia along with anxiety and depression.  He was in his usual state of health last evening when he developed chest pain radiating to his left arm associated with nausea and diaphoresis.  He was brought to the [facility] emergency room.  Initial high-sensitivity troponin was 61 and later rose to 485.  Other workup including a chest x-ray was unremarkable.  EKG showed question of an old inferior myocardial infarction.  He was seen by Dr. Algie Coffer.  He was admitted to the [facility] with acute non-ST elevation myocardial infarction and started on a heparin infusion.  He underwent left heart catheterization earlier today demonstrating a 70% proximal right coronary artery stenosis, normal LVEDP, severe aortic stenosis, and a 2+ aortic insufficiency.  Echocardiogram performed today confirms severe aortic stenosis with severe valvular calcification and mild AI. The mean aortic valve gradient today is with a peak gradient of 60.69mmHg. I do not see a calculate aortic valve area on the Echo done today.  Left ventricular ejection fraction by echo is 55-60%. RV function is preserved.    We have been asked to evaluate [name] for consideration of coronary bypass grafting along with aortic valve replacement. He is aware of having a murmur that was  first noted after his ablation procedure in 2018. An echo done in 2021 showed an aortic valve area of 1.6cm2 and a mean gradient of and peak gradient of .   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  He usually has regular dental visits but hasn't been in over 6 months.  He has several crowns and is aware that he needs some dental work but could not be specific about which teeth were involved or what needed to be done.      Current Activity/ Functional Status: Patient is independent with mobility/ambulation, transfers, ADL's, IADL's.   Zubrod Score: At the time of surgery this patient's most appropriate activity status/level should be described as: []     0    Normal activity, no symptoms [x]     1    Restricted in physical strenuous activity but ambulatory, able to do out light work []     2    Ambulatory and capable of self care, unable to do work activities, up and about                 more than 50%  Of the time                            []     3    Only limited self care, in bed greater than  50% of waking hours []     4    Completely disabled, no self care, confined to bed or chair []     5    Moribund       Past Medical History:  Diagnosis Date   Anxiety     Arthritis     Atrial fibrillation (HCC)     Basal cell carcinoma     Depression     Dysrhythmia     GERD (gastroesophageal reflux disease)     Hyperlipidemia     Squamous carcinoma                 Past Surgical History:  Procedure Laterality Date   ATRIAL FIBRILLATION ABLATION   07/22/2016   ATRIAL FIBRILLATION ABLATION N/A 07/22/2016    Procedure: Atrial Fibrillation Ablation;  Surgeon: Will Jorja Loa, MD;  Location: [facility];  Service: Cardiovascular;  Laterality: N/A;   BASAL CELL CARCINOMA EXCISION        forehead; top of scalp   BIOPSY   05/27/2017    Procedure: BIOPSY;  Surgeon: Corbin Ade, MD;  Location: [facility] ENDO  SU[facility];  Service: Endoscopy;;  right colon, left colon;   CARDIOVERSION N/A 04/07/2016    Procedure: CARDIOVERSION;  Surgeon: Jonelle Sidle, MD;  Location: [facility] ORS;  Service: Cardiovascular;  Laterality: N/A;   COLECTOMY   1990s    removal of section due to strangulated bowel.   COLONOSCOPY N/A 05/30/2013    Procedure: COLONOSCOPY;  Surgeon: Corbin Ade, MD;  Location: [facility];  Service: Endoscopy;  Laterality: N/A;  8:45 AM   COLONOSCOPY       COLONOSCOPY N/A 05/27/2017    Procedure: COLONOSCOPY;  Surgeon: Corbin Ade, MD;  Location: [facility];  Service: Endoscopy;  Laterality: N/A;  1:00pm   INGUINAL HERNIA REPAIR Right 1990s   LUMBAR LAMINECTOMY/DECOMPRESSION MICRODISCECTOMY N/A 12/29/2018    Procedure: Decompressive lumbar laminectomy at L3-L4;  Surgeon: Ranee Gosselin, MD;  Location: [facility] ORS;  Service: Orthopedics;  Laterality: N/A;    SHOULDER ARTHROSCOPY WITH ROTATOR CUFF REPAIR AND SUBACROMIAL DECOMPRESSION Left 05/15/2016    Procedure: LEFT SHOULDER ARTHROSCOPY WITH ROTATOR CUFF REPAIR AND SUBACROMIAL DECOMPRESSION AND DISTAL CLAVICLE RESECTION;  Surgeon: Francena Hanly, MD;  Location: [facility] OR;  Service: Orthopedics;  Laterality: Left;  requests 2hrs   SHOULDER SURGERY Right     SQUAMOUS CELL CARCINOMA EXCISION        scalp   TONSILLECTOMY              Tobacco Use History  Social History        Tobacco Use  Smoking Status Some Days   Packs/day: 0.50   Years: 47.00   Additional pack years: 0.00   Total pack years: 23.50   Types: Cigars, Cigarettes  Smokeless Tobacco Never      Social History        Substance and Sexual Activity  Alcohol Use Yes   Alcohol/week: 12.0 standard drinks of alcohol   Types: 12 Cans of beer per week    Comment: daily 3-4 in the summer; soemtimes more on weekends.        Allergies       Allergies  Allergen Reactions   Tramadol Other (See Comments)      Head felt funny                 Current  Facility-Administered Medications  Medication Dose Route Frequency Provider Last Rate  Last Admin   0.9 %  sodium chloride infusion   Intravenous Continuous Orpah Cobb, MD 30 mL/hr at 10/03/22 0100 Infusion Verify at 10/03/22 0100   0.9 %  sodium chloride infusion   Intravenous Continuous Orpah Cobb, MD       acetaminophen (TYLENOL) tablet 500 mg  500 mg Oral Q6H PRN Orpah Cobb, MD       aspirin EC tablet 81 mg  81 mg Oral Daily Orpah Cobb, MD       atorvastatin (LIPITOR) tablet 40 mg  40 mg Oral Daily Orpah Cobb, MD       citalopram (CELEXA) tablet 20 mg  20 mg Oral Daily Orpah Cobb, MD       heparin injection 5,000 Units  5,000 Units Subcutaneous Q8H Orpah Cobb, MD       hydrALAZINE (APRESOLINE) injection 10 mg  10 mg Intravenous Q20 Min PRN Orpah Cobb, MD       labetalol (NORMODYNE) injection 10 mg  10 mg Intravenous Q10 min PRN Orpah Cobb, MD       metoprolol tartrate (LOPRESSOR) tablet 12.5 mg  12.5 mg Oral BID Orpah Cobb, MD   12.5 mg at 10/02/22 2258   nitroGLYCERIN (NITROSTAT) SL tablet 0.4 mg  0.4 mg Sublingual Q5 Min x 3 PRN Orpah Cobb, MD       ondansetron (ZOFRAN) injection 4 mg  4 mg Intravenous Q6H PRN Orpah Cobb, MD       pantoprazole (PROTONIX) EC tablet 40 mg  40 mg Oral Daily Orpah Cobb, MD                     Medications Prior to Admission  Medication Sig Dispense Refill Last Dose   acetaminophen (TYLENOL) 500 MG tablet Take 1 tablet (500 mg total) by mouth every 6 (six) hours as needed. 30 tablet 0     azelastine (ASTELIN) 0.1 % nasal spray Place 2 sprays into both nostrils 2 (two) times daily. Use in each nostril as directed 30 mL 12     citalopram (CELEXA) 20 MG tablet Take 1 tablet (20 mg total) by mouth daily. 90 tablet 1     levocetirizine (XYZAL) 5 MG tablet Take 5 mg by mouth daily.         Melatonin 10 MG TABS Take 10 mg by mouth at bedtime as needed (sleep).         Multiple Vitamin (MULTIVITAMIN) tablet Take 1 tablet by  mouth daily.         Naphazoline-Pheniramine (ALLERGY EYE OP) Place 1-2 drops into both eyes 3 (three) times daily as needed (for seasonal allergy/itchy eyes).         omeprazole (PRILOSEC) 40 MG capsule Take 1 capsule (40 mg total) by mouth daily. 90 capsule 1     simvastatin (ZOCOR) 20 MG tablet Take 0.5 tablets (10 mg total) by mouth daily. 90 tablet 1                 Family History  Problem Relation Age of Onset   Colon cancer Brother 51   Throat cancer Brother     Breast cancer Mother              Review of Systems:                    Cardiac Review of Systems: Y or  [    ]= no  Chest Pain [  yes, now resolved  ]   Resting SOB [   ]         Exertional SOB  [  x]   Orthopnea [  ]             Pedal Edema [   ]        Palpitations [  ]            Syncope  [  ]    Presyncope [   ]             General Review of Systems: [Y] = yes [  ]=no Constitional: recent weight change [  ]; anorexia [  ]; fatigue [  ]; nausea [  ]; night sweats [  ]; fever [  ]; or chills [  ]                                                               Dental: Last Dentist visit: over 6 months ago             Eye : blurred vision [  ]; diplopia [   ]; vision changes [  ];  Amaurosis fugax[  ]; Resp: cough [  ];  wheezing[  ];  hemoptysis[  ]; shortness of breath[x  ]; paroxysmal nocturnal dyspnea[  ]; dyspnea on exertion[  ]; or orthopnea[  ];  GI:  gallstones[  ], vomiting[  ];  dysphagia[  ]; melena[  ];  hematochezia [  ]; heartburn[  ];   Hx of  Colonoscopy[  ]; GU: kidney stones [  ]; hematuria[  ];   dysuria [  ];  nocturia[  ];  history of     obstruction [  ]; urinary frequency [  ]             Skin: rash, swelling[  ];, hair loss[  ];  peripheral edema[  ];  or itching[  ]; Musculosketetal: myalgias[  ];  joint swelling[  ];  joint erythema[  ];  joint pain[  ];  back pain[  ];             Heme/Lymph: bruising[  ];  bleeding[  ];  anemia[  ];  Neuro: TIA[  ];  headaches[  ];  stroke[  ];   vertigo[  ];  seizures[  ];   paresthesias[  ];  difficulty walking[  ];             Psych:depression[ x ]; anxiety[ x ];             Endocrine: diabetes[  ];  thyroid dysfunction[  ];                                         Physical Exam: BP (!) 158/88 (BP Location: Left Arm)   Pulse 72   Temp 98.2 F (36.8 C) (Oral)   Resp 18   Ht 5' 10.5" (1.791 m)   Wt 94.7 kg   SpO2 99%   BMI 29.53 kg/m      General appearance: alert, cooperative, and no distress Head: Normocephalic, without obvious  abnormality, atraumatic Neck: no adenopathy, no carotid bruit, no JVD, and supple, symmetrical, trachea midline Lymph nodes: no cervical or clavicular adenopathy Resp: clear to auscultation bilaterally Cardio: RRR, grade 4/6 systolic murmur GI: soft, active bowel sounds. No tenderness Extremities: no obvious deformities, all well perfused with palpable distel pulses Neurologic: Grossly normal   Diagnostic Studies & Laboratory data:    Procedures   RIGHT HEART CATH AND CORONARY ANGIOGRAPHY  RIGHT/LEFT HEART CATH AND CORONARY ANGIOGRAPHY    Conclusion       Prox LAD to Mid LAD lesion is 30% stenosed.   Prox RCA lesion is 70% stenosed.   LV end diastolic pressure is normal.   Hemodynamic findings consistent with aortic valve stenosis.   There is severe aortic valve stenosis. There is mild (2+) aortic regurgitation.   Recommend dual antiplatelet therapy with Aspirin 81mg  daily and Clopidogrel 75mg  daily.   CVTS/TVAR consult + RCA angioplasty to follow AV replacement.   Recommendations   Antiplatelet/Anticoag Recommend dual antiplatelet therapy with Aspirin 81mg  daily and Clopidogrel 75mg  daily.    Indications   Nonrheumatic aortic valve stenosis [I35.0 (ICD-10-CM)]  Atherosclerosis of native coronary artery of native heart with unstable angina pectoris (HCC) [I25.110 (ICD-10-CM)]    Clinical Presentation   CHF/Shock Congestive heart failure not present. No shock present.     Procedural Details   Technical Details PROCEDURE:  Right and Left heart catheterization with selective coronary angiography, left ventriculogram.  CLINICAL HISTORY:  This is a 72 years old white male with chest pain and elevated troponin I has HLD and atrial fibrillation. S/P ablation..  The risks, benefits, and details of the procedure were explained to the patient.  The patient verbalized understanding and wanted to proceed.  Informed written consent was obtained.  PROCEDURE TECHNIQUE:  The patient was approached from the right femoral vein using 7 French short sheath and right femoral artery using a 5 French short sheath. Right heart pressures, cardiac output and oxygen saturation were measured using a Swan Ganz catheter placed through right femoral vein after the left heart catheterization. Left coronary angiography was done using a Judkins L4 guide catheter followed by Amplatz 1.  Right coronary angiography was done using a Judkins R4 guide catheter. Left ventriculography was done using a JR 4 catheter. He had normal cardiac output but 37.5 mm mean aortic valve gradient and valve area of 1.04 cm2 suggestive of severe AS.   CONTRAST:  Total of 95 cc.  Total Sedation time: I was present -  Minutes face to face 100 % of the time.  Estimated blood loss <50 mL.   During this procedure medications were administered to achieve and maintain moderate conscious sedation while the patient's heart rate, blood pressure, and oxygen saturation were continuously monitored and I was present face-to-face 100% of this time.      Coronary Findings   Diagnostic Dominance: Co-dominant Left Main  Vessel was injected. Vessel is normal in caliber. Vessel is angiographically normal.    Left Anterior Descending  Vessel was injected. Vessel is normal in caliber. There is mild focal disease in the vessel.  Prox LAD to Mid LAD lesion is 30% stenosed. Vessel is not the culprit lesion. The lesion is type A, focal  and concentric. The lesion was not previously treated . The stenosis was measured by a visual reading. Pressure gradient was not performed on the lesion. IVUS was not performed. No optical coherence tomography (OCT) was performed.    Ramus Intermedius  Vessel was  injected. Vessel is large. Vessel is angiographically normal.    Left Circumflex  Vessel was injected. Vessel is moderate in size. Vessel is angiographically normal.    Right Coronary Artery  Vessel was injected. Vessel is moderate in size. There is severe focal disease in the vessel.  Prox RCA lesion is 70% stenosed. Vessel is the culprit lesion. The lesion is type C, concentric and irregular. The lesion was not previously treated . The stenosis was measured by a visual reading. Pressure gradient was not performed on the lesion. IVUS was not performed. No optical coherence tomography (OCT) was performed.    Intervention    No interventions have been documented.    Right Heart   Right Heart Pressures Hemodynamic findings consistent with aortic valve stenosis. LV EDP is normal.  Right Atrium The right atrial size is normal. Right atrial pressure is normal.  Right Ventricle The right ventricular size is normal.    Left Heart   Aortic Valve There is severe aortic valve stenosis. There is mild (2+) aortic regurgitation. The aortic valve is calcified. There is restricted aortic valve motion.        ECHOCARDIOGRAM REPORT       Patient Name:   [facility] Date of Exam: 10/03/2022 Medical Rec #:  [id number]        Height:       70.5 in Accession #:    [id number]       Weight:       208.8 lb Date of Birth:  [birth year]        BSA:          2.137 m Patient Age:    72 years         BP:           180/85 mmHg Patient Gender: M                HR:           64 bpm. Exam Location:  Inpatient  Procedure: 2D Echo, Cardiac Doppler and Color Doppler  Indications:    Acute Myocardial infarction   History:        Patient has prior  history of Echocardiogram examinations, most                 recent 07/19/2019. Aortic Valve Disease, Arrythmias:Atrial                 Fibrillation; Risk Factors:Dyslipidemia.   Sonographer:    Raeford Razor Referring Phys: [phone] [facility]    Sonographer Comments: Technically difficult study due to poor echo windows and no parasternal window. Image acquisition challenging due to patient body habitus. IMPRESSIONS    1. Left ventricular ejection fraction, by estimation, is 55 to 60%. The left ventricle has normal function. The left ventricle has no regional wall motion abnormalities. Left ventricular diastolic parameters are consistent with Grade I diastolic dysfunction (impaired relaxation).  2. Right ventricular systolic function is normal. The right ventricular size is normal.  3. Left atrial size was moderately dilated.  4. Right atrial size was mildly dilated.  5. The mitral valve is normal in structure. Trivial mitral valve regurgitation.  6. The aortic valve is calcified. There is severe calcifcation of the aortic valve. There is moderate thickening of the aortic valve. Aortic valve regurgitation is mild. Severe aortic valve stenosis.  7. There is mild (Grade II) atheroma plaque involving the aortic root and ascending aorta.  8. The inferior vena  cava is dilated in size with >50% respiratory variability, suggesting right atrial pressure of 8 mmHg.  Conclusion(s)/Recommendation(s): Findings consistent with severe valvular heart disease.  FINDINGS  Left Ventricle: Left ventricular ejection fraction, by estimation, is 55 to 60%. The left ventricle has normal function. The left ventricle has no regional wall motion abnormalities. The left ventricular internal cavity size was normal in size. There is  borderline left ventricular hypertrophy. Left ventricular diastolic parameters are consistent with Grade I diastolic dysfunction (impaired relaxation).  Right Ventricle: The  right ventricular size is normal. No increase in right ventricular wall thickness. Right ventricular systolic function is normal.  Left Atrium: Left atrial size was moderately dilated.  Right Atrium: Right atrial size was mildly dilated.  Pericardium: There is no evidence of pericardial effusion.  Mitral Valve: The mitral valve is normal in structure. Trivial mitral valve regurgitation.  Tricuspid Valve: The tricuspid valve is grossly normal. Tricuspid valve regurgitation is trivial.  Aortic Valve: The aortic valve is calcified. There is severe calcifcation of the aortic valve. There is moderate thickening of the aortic valve. There is moderate aortic valve annular calcification. Aortic valve regurgitation is mild. Severe aortic stenosis is present. Aortic valve mean gradient measures 33.0 mmHg. Aortic valve peak gradient measures 60.8 mmHg.  Pulmonic Valve: The pulmonic valve was normal in structure. Pulmonic valve regurgitation is not visualized.  Aorta: The aortic root is normal in size and structure. There is mild (Grade II) atheroma plaque involving the aortic root and ascending aorta.  Venous: The inferior vena cava is dilated in size with greater than 50% respiratory variability, suggesting right atrial pressure of 8 mmHg.  IAS/Shunts: The atrial septum is grossly normal.      LV Volumes (MOD) LV vol d, MOD A2C: 132.0 ml Diastology LV vol d, MOD A4C: 192.0 ml LV e' medial:    6.99 cm/s LV vol s, MOD A2C: 62.6 ml  LV E/e' medial:  11.0 LV vol s, MOD A4C: 91.5 ml  LV e' lateral:   7.93 cm/s LV SV MOD A2C:     69.4 ml  LV E/e' lateral: 9.7 LV SV MOD A4C:     192.0 ml LV SV MOD BP:      88.9 ml  RIGHT VENTRICLE            IVC RV S prime:     9.17 cm/s  IVC diam: 2.50 cm TAPSE (M-mode): 2.6 cm  LEFT ATRIUM             Index        RIGHT ATRIUM           Index LA Vol (A2C):   59.5 ml 27.84 ml/m  RA Area:     13.70 cm LA Vol (A4C):   73.6 ml 34.44 ml/m  RA  Volume:   33.50 ml  15.68 ml/m LA Biplane Vol: 68.7 ml 32.15 ml/m  AORTIC VALVE AV Vmax:           390.00 cm/s AV Vmean:          267.000 cm/s AV VTI:            0.882 m AV Peak Grad:      60.8 mmHg AV Mean Grad:      33.0 mmHg LVOT Vmax:         103.00 cm/s LVOT Vmean:        64.900 cm/s LVOT VTI:          0.218 m  LVOT/AV VTI ratio: 0.25  MITRAL VALVE MV Area (PHT): 2.99 cm     SHUNTS MV Decel Time: 254 msec     Systemic VTI: 0.22 m MV E velocity: 77.10 cm/s MV A velocity: 104.00 cm/s MV E/A ratio:  0.74  Orpah Cobb MD Electronically signed by Orpah Cobb MD Signature Date/Time: 10/03/2022/11:35:40 AM       Final      Imaging Info       Recent Radiology Findings:    DG Chest 2 View   Result Date: 10/02/2022 CLINICAL DATA:  Chest pain and pressure in the center of chest EXAM: CHEST - 2 VIEW COMPARISON:  Radiographs 07/15/2021 FINDINGS: Stable cardiomediastinal silhouette. Aortic atherosclerotic calcification. Left basilar scarring. No focal consolidation, pleural effusion, or pneumothorax. No displaced rib fractures. Remote left rib fractures. IMPRESSION: No active cardiopulmonary disease. Electronically Signed   By: Minerva Fester M.D.   On: 10/02/2022 19:05     I have independently reviewed the above radiologic studies and discussed with the patient    Recent Lab Findings: Recent Labs       Lab Results  Component Value Date    WBC 7.8 10/03/2022    HGB 14.9 10/03/2022    HCT 44.5 10/03/2022    PLT 204 10/03/2022    GLUCOSE 99 10/03/2022    CHOL 186 10/03/2022    TRIG 75 10/03/2022    HDL 47 10/03/2022    LDLCALC 124 (H) 10/03/2022    ALT 27 10/02/2022    AST 23 10/02/2022    NA 138 10/03/2022    K 4.2 10/03/2022    CL 106 10/03/2022    CREATININE 0.93 10/03/2022    BUN 14 10/03/2022    CO2 22 10/03/2022    TSH 1.520 02/24/2017    INR 1.0 11/08/2020            Assessment / Plan:      -Single-vessel coronary artery disease presenting with  NSTEMI and severe aortic stenosis that has progressed significantly in the past 3 years. There is also significant aortic valvular calcification.  He remains fairly active.  He is stable and not having chest pain or shortness of breath at rest. Expect he would gain the most long-term benefit from surgical valve replacement and CABG over TAVR and PCI.   Both options were discussed with him and wife. He would like to have open surgical AVR and CABG. I discussed the operative procedure with the patient and his wife including alternatives, benefits and risks; including but not limited to bleeding, blood transfusion, infection, stroke, myocardial infarction, graft failure, heart block requiring a permanent pacemaker, organ dysfunction, and death.  [facility] understands and agrees to proceed.

## 2022-10-09 NOTE — Progress Notes (Signed)
PCP - Dr. Remo Lipps  Cardiologist - - saw Dr. Algie Coffer in the Hospital, he wants to stay with a cardiologist in the [facility] group in [place].  EP-Dr. Kristopher Oppenheim  Endocrine-none  Pulm-none  Chest x-ray - 10/09/22  EKG - 10/04/22  Stress Test - no  ECHO - 10/03/22  Cardiac Cath - 10/03/22  AICD-no PM-no LOOP-no  Nerve Stimulator-no  Dialysis-no  Sleep Study - no CPAP - no  LABS-CBC, CMP, PT, PTT, ABG, T/S, UA, PCR, Covid.  ASA-no  ERAS-no  HA1C- GLP-1-no Fasting Blood Sugar - na Checks Blood Sugar __na___ times a day  Anesthesia- [facility] HR on arrival was 36 using dyamap, on recheck it was 36. Patient denies shortness of lightheadedness, dizziness. An EKG was done, HR was 46.  [name], PA-C came to see [facility] in [employer] [employer], after gathering information, she spoke with Dr. Algie Coffer who said to have patient stop Metoprolol.  Jon [facility] reported thtat he tested positive for Covid around Aug 18, 2022, he did a home test and did not tell a MD.  [facility] said that he had Covid 4 times in the past, he knew what to do.  Covid test was repeated today, since there is no record of positive Covid. Pt denies having chest pain, sob, or fever at this time. All instructions explained to the pt, with a verbal understanding of the material. Pt agrees to go over the instructions while at home for a better understanding. The opportunity to ask questions was provided.

## 2022-10-09 NOTE — Anesthesia Preprocedure Evaluation (Addendum)
Anesthesia Evaluation  Patient identified by MRN, date of birth, ID band Patient awake    Reviewed: Allergy & Precautions, NPO status , Patient's Chart, lab work & pertinent test results  Airway Mallampati: II  TM Distance: >3 FB Neck ROM: Full    Dental  (+) Teeth Intact, Dental Advisory Given, Chipped   Pulmonary Current Smoker   Pulmonary exam normal breath sounds clear to auscultation       Cardiovascular hypertension, Pt. on home beta blockers + CAD and + Past MI  + dysrhythmias Atrial Fibrillation + Valvular Problems/Murmurs AS  Rhythm:Regular Rate:Normal + Systolic murmurs    Neuro/Psych  PSYCHIATRIC DISORDERS Anxiety Depression    negative neurological ROS     GI/Hepatic Neg liver ROS,GERD  Medicated,,  Endo/Other  negative endocrine ROS    Renal/GU negative Renal ROS     Musculoskeletal  (+) Arthritis ,    Abdominal   Peds  Hematology negative hematology ROS (+)   Anesthesia Other Findings   Reproductive/Obstetrics                             Anesthesia Physical Anesthesia Plan  ASA: 4  Anesthesia Plan: General   Post-op Pain Management:    Induction: Intravenous  PONV Risk Score and Plan: 1 and Midazolam and Treatment may vary due to age or medical condition  Airway Management Planned: Oral ETT  Additional Equipment: Arterial line, CVP, PA Cath, TEE, 3D TEE and Ultrasound Guidance Line Placement  Intra-op Plan:   Post-operative Plan: Post-operative intubation/ventilation  Informed Consent: I have reviewed the patients History and Physical, chart, labs and discussed the procedure including the risks, benefits and alternatives for the proposed anesthesia with the patient or authorized representative who has indicated his/her understanding and acceptance.     Dental advisory given  Plan Discussed with: CRNA  Anesthesia Plan Comments: (PAT note written 10/09/2022  by [name], PA-C.   )       Anesthesia Quick Evaluation

## 2022-10-09 NOTE — Progress Notes (Signed)
Anesthesia APP PAT Evaluation:  Case: [id number] Date/Time: 10/10/22 0715   Procedures:      CORONARY ARTERY BYPASS GRAFTING (CABG) (Chest)     AORTIC VALVE REPLACEMENT (AVR) (Chest)     TRANSESOPHAGEAL ECHOCARDIOGRAM   Anesthesia type: General   Pre-op diagnosis:      SEVERE AS     CAD   [facility] / [facility]   Surgeons: Alleen Borne, MD       DISCUSSION: Patient is a 72 year old male scheduled for the above procedure.  History includes smoking, CAD (NSTEMI 10/02/22), AS, afib (s/p DCCV/ s/p ablation 07/22/16), HLD, GERD, anemia, skin cancer (SCC, BCC), colon resection (torsion or incarcerated bowel?), spinal surgery (L3-4 laminectomy 12/29/18, redo with PLIF 11/19/20).   Admitted to [facility] 10/02/22 - 10/04/22 for NSTEMI. Also diagnosed with 1V CAD and severe AS. He was evaluated by the structural heart team. Per Dr. Verne Carrow, "Given no other significant co-morbidities and good functional status at baseline, likely felt to be best served with CABG/AVR and if needed, he can be considered for TAVR in the future." Low dose b-blocker added at discharge.   PAT on 10/09/22. HR initially as low as 36 bpm with VS. HR 43 bpm on recheck and 46 bpm on EKG that showed SB. He denied syncope/presyncope, chest pain, SOB. Ever since his hospital discharge he feels fatigued, like he could sleep, but says it is persistent and not worsening over the past week.  I spoke with Dr. Algie Coffer (while patient was at PAT) who recommended holding metoprolol 12.5 mg BID for now and can reconsider post-operatively. Mr. Mahone notified and aware to see immediate evaluation if he develops symptomatic bradycardia or other CV symptoms. I also sent communication to TCTS RN Ryan and to Dr. Laneta Simmers.  Of note, he reported + home COVID-19 test around 08/18/22. No current respiratory symptoms. Presurgical COVID-19 test is pending. Preoperative CXR is still in process as well.   Anesthesia team to evaluate on the day of  surgery.   VS:  Wt Readings from Last 3 Encounters:  10/09/22 94.2 kg  10/04/22 91.7 kg  07/10/22 97.1 kg   Temp Readings from Last 3 Encounters:  10/09/22 36.7 C  10/04/22 (!) 36.3 C (Oral)  07/10/22 37 C   BP Readings from Last 3 Encounters:  10/09/22 (!) 138/58  10/04/22 (!) 146/70  07/10/22 (!) 153/78   Pulse Readings from Last 3 Encounters:  10/09/22 (!) 43  10/04/22 65  07/10/22 62     PROVIDERS: Tommie Sams, DO is PCP  Loman Brooklyn, MD is EP cardiologist. He saw primary cardiologist Nona Dell, MD in the past, but not since 2018. He was unassigned to primary cardiology at the time of his NSTEMI, so was admitted by Orpah Cobb, MD. For long term primary cardiology follow-up, he prefers to get established with cardiology at the same office as Dr. Elberta Fortis in [place]. Ryan and TCTS aware.   LABS: 10/09/22 PAT lab results include: Lab Results  Component Value Date   WBC 8.2 10/09/2022   HGB 14.7 10/09/2022   HCT 44.2 10/09/2022   PLT 212 10/09/2022   GLUCOSE 93 10/09/2022   CHOL 186 10/03/2022   TRIG 75 10/03/2022   HDL 47 10/03/2022   LDLCALC 124 (H) 10/03/2022   ALT 26 10/09/2022   AST 19 10/09/2022   NA 134 (L) 10/09/2022   K 4.1 10/09/2022   CL 106 10/09/2022   CREATININE 0.82 10/09/2022  BUN 15 10/09/2022   CO2 21 (L) 10/09/2022   INR 1.0 10/09/2022    IMAGES: CXR 10/09/22: In process.   EKG: 10/09/22: Ventricular rate 46 bpm Sinus bradycardia with occasional Premature ventricular complexes Nonspecific T wave abnormality Abnormal ECG When compared with ECG of 04-Oct-2022 05:31, PREVIOUS ECG IS PRESENT Confirmed by Orpah Cobb ([id number]) on 10/09/2022 2:45:08 PM   CV: Carotid US 10/09/22: Summary:  Right Carotid: Velocities in the right ICA are consistent with a 1-39%  stenosis.  Left Carotid: Velocities in the left ICA are consistent with a 1-39%  stenosis.  Vertebrals: Bilateral vertebral arteries demonstrate antegrade flow.   Subclavians: Normal flow hemodynamics were seen in bilateral subclavian               arteries.   Echo 10/03/22: IMPRESSIONS   1. Left ventricular ejection fraction, by estimation, is 55 to 60%. The  left ventricle has normal function. The left ventricle has no regional  wall motion abnormalities. Left ventricular diastolic parameters are  consistent with Grade I diastolic  dysfunction (impaired relaxation).   2. Right ventricular systolic function is normal. The right ventricular  size is normal.   3. Left atrial size was moderately dilated.   4. Right atrial size was mildly dilated.   5. The mitral valve is normal in structure. Trivial mitral valve  regurgitation.   6. The aortic valve is calcified. There is severe calcifcation of the  aortic valve. There is moderate thickening of the aortic valve. Aortic  valve regurgitation is mild. Severe aortic valve stenosis.   7. There is mild (Grade II) atheroma plaque involving the aortic root and  ascending aorta.   8. The inferior vena cava is dilated in size with >50% respiratory  variability, suggesting right atrial pressure of 8 mmHg.   Conclusion(s)/Recommendation(s): Findings consistent with severe valvular  heart disease.    Cardiac cath 10/03/22:   Prox LAD to Mid LAD lesion is 30% stenosed.   Prox RCA lesion is 70% stenosed.   LV end diastolic pressure is normal.   Hemodynamic findings consistent with aortic valve stenosis.   There is severe aortic valve stenosis. There is mild (2+) aortic regurgitation.   Recommend dual antiplatelet therapy with Aspirin 81mg  daily and Clopidogrel 75mg  daily.   CVTS/TVAR consult + RCA angioplasty to follow AV replacement.   EP Procedure 07/22/16: 1. Comprehensive electrophysiologic study. 2. Coronary sinus pacing and recording. 3. Three-dimensional mapping of atrial fibrillation with additional mapping and ablation of a second discrete focus 4. Ablation of atrial fibrillation with  additional mapping and ablation of a second discrete focus 5. Intracardiac echocardiography. 6. Transseptal puncture of an intact septum. 7. Cardioversion 8. Arrhythmia induction with pacing with dobutamine infusion  Past Medical History:  Diagnosis Date   Anemia    as a child   Anxiety    Arthritis    Atrial fibrillation (HCC)    Basal cell carcinoma    head   Coronary artery disease    Depression    Dysrhythmia    GERD (gastroesophageal reflux disease)    Heart murmur    Hyperlipidemia    Myocardial infarction (HCC)    Pneumonia 02/2022   Squamous carcinoma     Past Surgical History:  Procedure Laterality Date   ATRIAL FIBRILLATION ABLATION  07/22/2016   ATRIAL FIBRILLATION ABLATION N/A 07/22/2016   Procedure: Atrial Fibrillation Ablation;  Surgeon: Will Jorja Loa, MD;  [facility];  Service: Cardiovascular;  Laterality: N/A;  BASAL CELL CARCINOMA EXCISION     forehead; top of scalp   BIOPSY  05/27/2017   Procedure: BIOPSY;  Surgeon: Corbin Ade, MD;  [facility]: [facility];  Service: Endoscopy;;  right colon, left colon;   CARDIOVERSION N/A 04/07/2016   Procedure: CARDIOVERSION;  Surgeon: Jonelle Sidle, MD;  Location: [facility] ORS;  Service: Cardiovascular;  Laterality: N/A;   COLECTOMY  1990s   removal of section due to strangulated bowel.   COLONOSCOPY N/A 05/30/2013   Procedure: COLONOSCOPY;  Surgeon: Corbin Ade, MD;  Location[facility];  Service: Endoscopy;  Laterality: N/A;  8:45 AM   COLONOSCOPY     COLONOSCOPY N/A 05/27/2017   Procedure: COLONOSCOPY;  Surgeon: Corbin Ade, MD;  Location: [facility];  Service: Endoscopy;  Laterality: N/A;  1:00pm   INGUINAL HERNIA REPAIR Right 1990s   LUMBAR LAMINECTOMY/DECOMPRESSION MICRODISCECTOMY N/A 12/29/2018   Procedure: Decompressive lumbar laminectomy at L3-L4;  Surgeon: Ranee Gosselin, MD;  Location: [facility];  Service: Orthopedics;  Laterality: N/A;    RIGHT HEART CATH AND  CORONARY ANGIOGRAPHY N/A 10/03/2022   Procedure: RIGHT HEART CATH AND CORONARY ANGIOGRAPHY;  Surgeon: Orpah Cobb, MD;  [facility];  Service: Cardiovascular;  Laterality: N/A;   RIGHT/LEFT HEART CATH AND CORONARY ANGIOGRAPHY N/A 10/03/2022   Procedure: RIGHT/LEFT HEART CATH AND CORONARY ANGIOGRAPHY;  Surgeon: Orpah Cobb, MD;  [facility];  Service: Cardiovascular;  Laterality: N/A;   SHOULDER ARTHROSCOPY WITH ROTATOR CUFF REPAIR AND SUBACROMIAL DECOMPRESSION Left 05/15/2016   Procedure: LEFT SHOULDER ARTHROSCOPY WITH ROTATOR CUFF REPAIR AND SUBACROMIAL DECOMPRESSION AND DISTAL CLAVICLE RESECTION;  Surgeon: Francena Hanly, MD;  Location: [facility];  Service: Orthopedics;  Laterality: Left;  requests 2hrs   SHOULDER SURGERY Right    SQUAMOUS CELL CARCINOMA EXCISION     scalp   TONSILLECTOMY      MEDICATIONS:  [START ON 10/10/2022] ceFAZolin (ANCEF) IVPB 2g/100 mL premix   [START ON 10/10/2022] ceFAZolin (ANCEF) IVPB 2g/100 mL premix   [START ON 10/10/2022] dexmedetomidine (PRECEDEX) 400 MCG/100ML (4 mcg/mL) infusion   [START ON 10/10/2022] EPINEPHrine (ADRENALIN) 5 mg in NS 250 mL (0.02 mg/mL) premix infusion   [START ON 10/10/2022] heparin 30,000 units/NS 1000 mL solution for CELLSAVER   [START ON 10/10/2022] heparin sodium (porcine) 2,500 Units, papaverine 30 mg in electrolyte-A (PLASMALYTE-A PH 7.4) 500 mL irrigation   [START ON 10/10/2022] insulin regular, human (MYXREDLIN) 100 units/ 100 mL infusion   [START ON 10/10/2022] [place] Blood Cardioplegia vial (lidocaine/magnesium/mannitol 0.26g-4g-6.4g)   [START ON 10/10/2022] milrinone (PRIMACOR) 20 MG/100 ML (0.2 mg/mL) infusion   [START ON 10/10/2022] nitroGLYCERIN 50 mg in dextrose 5 % 250 mL (0.2 mg/mL) infusion   [START ON 10/10/2022] norepinephrine (LEVOPHED) 4mg  in (0.016 mg/mL) premix infusion   [START ON 10/10/2022] phenylephrine (NEO-SYNEPHRINE) 20mg /NS premix infusion   [START ON 10/10/2022] potassium  chloride injection 80 mEq   [START ON 10/10/2022] tranexamic acid (CYKLOKAPRON) 2,500 mg in sodium chloride 0.9 % 250 mL (10 mg/mL) infusion   [START ON 10/10/2022] tranexamic acid (CYKLOKAPRON) bolus via infusion - over 30 minutes 1,375.5 mg   [START ON 10/10/2022] tranexamic acid (CYKLOKAPRON) pump prime solution 183 mg   [START ON 10/10/2022] vancomycin (VANCOREADY) IVPB 1500 mg/300 mL    atorvastatin (LIPITOR) 40 MG tablet   citalopram (CELEXA) 20 MG tablet   levocetirizine (XYZAL) 5 MG tablet   Melatonin 10 MG TABS   metoprolol tartrate (LOPRESSOR) 25 MG tablet  Multiple Vitamin (MULTIVITAMIN) tablet   omeprazole (PRILOSEC) 40 MG capsule    [name], PA-C Surgical Short Stay/Anesthesiology [facility] Phone [phone] [facility] Phone [phone] 10/09/2022 4:23 PM

## 2022-10-10 ENCOUNTER — Inpatient Hospital Stay (HOSPITAL_COMMUNITY)
Admission: RE | Admit: 2022-10-10 | Discharge: 2022-10-14 | DRG: 219 | Disposition: A | Discharge status: Home Health | Payer: Medicare Other | Attending: Surgery | Admitting: Surgery

## 2022-10-10 ENCOUNTER — Inpatient Hospital Stay (HOSPITAL_COMMUNITY): Payer: Medicare Other | Admitting: Certified Registered Nurse Anesthetist

## 2022-10-10 ENCOUNTER — Other Ambulatory Visit: Payer: Self-pay

## 2022-10-10 ENCOUNTER — Encounter (HOSPITAL_COMMUNITY): Payer: Self-pay | Admitting: Surgery

## 2022-10-10 ENCOUNTER — Inpatient Hospital Stay (HOSPITAL_COMMUNITY): Payer: Medicare Other

## 2022-10-10 ENCOUNTER — Inpatient Hospital Stay (HOSPITAL_COMMUNITY)
Admission: RE | Disposition: A | Discharge status: Home / Self Care | Payer: Self-pay | Source: Home / Self Care | Attending: Surgery

## 2022-10-10 DIAGNOSIS — Z800 Family history of malignant neoplasm of digestive organs: Secondary | ICD-10-CM | POA: Diagnosis not present

## 2022-10-10 DIAGNOSIS — I214 Non-ST elevation (NSTEMI) myocardial infarction: Secondary | ICD-10-CM

## 2022-10-10 DIAGNOSIS — R0989 Other specified symptoms and signs involving the circulatory and respiratory systems: Secondary | ICD-10-CM | POA: Diagnosis not present

## 2022-10-10 DIAGNOSIS — Z1152 Encounter for screening for COVID-19: Secondary | ICD-10-CM

## 2022-10-10 DIAGNOSIS — I10 Essential (primary) hypertension: Secondary | ICD-10-CM | POA: Diagnosis present

## 2022-10-10 DIAGNOSIS — I350 Nonrheumatic aortic (valve) stenosis: Principal | ICD-10-CM

## 2022-10-10 DIAGNOSIS — I352 Nonrheumatic aortic (valve) stenosis with insufficiency: Secondary | ICD-10-CM | POA: Diagnosis present

## 2022-10-10 DIAGNOSIS — F17210 Nicotine dependence, cigarettes, uncomplicated: Secondary | ICD-10-CM | POA: Diagnosis present

## 2022-10-10 DIAGNOSIS — Z79899 Other long term (current) drug therapy: Secondary | ICD-10-CM

## 2022-10-10 DIAGNOSIS — I2510 Atherosclerotic heart disease of native coronary artery without angina pectoris: Secondary | ICD-10-CM

## 2022-10-10 DIAGNOSIS — R918 Other nonspecific abnormal finding of lung field: Secondary | ICD-10-CM | POA: Diagnosis not present

## 2022-10-10 DIAGNOSIS — J90 Pleural effusion, not elsewhere classified: Secondary | ICD-10-CM | POA: Diagnosis not present

## 2022-10-10 DIAGNOSIS — M1990 Unspecified osteoarthritis, unspecified site: Secondary | ICD-10-CM | POA: Diagnosis present

## 2022-10-10 DIAGNOSIS — Z981 Arthrodesis status: Secondary | ICD-10-CM | POA: Diagnosis not present

## 2022-10-10 DIAGNOSIS — D696 Thrombocytopenia, unspecified: Secondary | ICD-10-CM | POA: Diagnosis present

## 2022-10-10 DIAGNOSIS — Z48812 Encounter for surgical aftercare following surgery on the circulatory system: Secondary | ICD-10-CM | POA: Diagnosis not present

## 2022-10-10 DIAGNOSIS — F419 Anxiety disorder, unspecified: Secondary | ICD-10-CM | POA: Diagnosis present

## 2022-10-10 DIAGNOSIS — Z85828 Personal history of other malignant neoplasm of skin: Secondary | ICD-10-CM

## 2022-10-10 DIAGNOSIS — F32A Depression, unspecified: Secondary | ICD-10-CM | POA: Diagnosis present

## 2022-10-10 DIAGNOSIS — Z96612 Presence of left artificial shoulder joint: Secondary | ICD-10-CM | POA: Diagnosis present

## 2022-10-10 DIAGNOSIS — K5900 Constipation, unspecified: Secondary | ICD-10-CM | POA: Diagnosis not present

## 2022-10-10 DIAGNOSIS — I4891 Unspecified atrial fibrillation: Secondary | ICD-10-CM | POA: Diagnosis not present

## 2022-10-10 DIAGNOSIS — J309 Allergic rhinitis, unspecified: Secondary | ICD-10-CM | POA: Diagnosis present

## 2022-10-10 DIAGNOSIS — Z9049 Acquired absence of other specified parts of digestive tract: Secondary | ICD-10-CM | POA: Diagnosis not present

## 2022-10-10 DIAGNOSIS — I358 Other nonrheumatic aortic valve disorders: Secondary | ICD-10-CM | POA: Diagnosis not present

## 2022-10-10 DIAGNOSIS — Z888 Allergy status to other drugs, medicaments and biological substances status: Secondary | ICD-10-CM | POA: Diagnosis not present

## 2022-10-10 DIAGNOSIS — K219 Gastro-esophageal reflux disease without esophagitis: Secondary | ICD-10-CM | POA: Diagnosis present

## 2022-10-10 DIAGNOSIS — I252 Old myocardial infarction: Secondary | ICD-10-CM

## 2022-10-10 DIAGNOSIS — J9811 Atelectasis: Secondary | ICD-10-CM | POA: Diagnosis not present

## 2022-10-10 DIAGNOSIS — Z953 Presence of xenogenic heart valve: Secondary | ICD-10-CM

## 2022-10-10 DIAGNOSIS — E785 Hyperlipidemia, unspecified: Secondary | ICD-10-CM | POA: Diagnosis present

## 2022-10-10 DIAGNOSIS — J939 Pneumothorax, unspecified: Secondary | ICD-10-CM | POA: Diagnosis not present

## 2022-10-10 DIAGNOSIS — Z01818 Encounter for other preprocedural examination: Secondary | ICD-10-CM

## 2022-10-10 DIAGNOSIS — Z471 Aftercare following joint replacement surgery: Secondary | ICD-10-CM | POA: Diagnosis not present

## 2022-10-10 DIAGNOSIS — D62 Acute posthemorrhagic anemia: Secondary | ICD-10-CM | POA: Diagnosis not present

## 2022-10-10 DIAGNOSIS — Z951 Presence of aortocoronary bypass graft: Secondary | ICD-10-CM

## 2022-10-10 HISTORY — PX: TEE WITHOUT CARDIOVERSION: SHX5443

## 2022-10-10 HISTORY — PX: CORONARY ARTERY BYPASS GRAFT: SHX141

## 2022-10-10 HISTORY — PX: AORTIC VALVE REPLACEMENT: SHX41

## 2022-10-10 LAB — POCT I-STAT 7, (LYTES, BLD GAS, ICA,H+H)
Acid-base deficit: 1 mmol/L (ref 0.0–2.0)
Acid-base deficit: 2 mmol/L (ref 0.0–2.0)
Acid-base deficit: 1 mmol/L (ref 0.0–2.0)
Acid-base deficit: 2 mmol/L (ref 0.0–2.0)
Acid-base deficit: 5 mmol/L — ABNORMAL HIGH (ref 0.0–2.0)
Acid-base deficit: 3 mmol/L — ABNORMAL HIGH (ref 0.0–2.0)
Acid-base deficit: 4 mmol/L — ABNORMAL HIGH (ref 0.0–2.0)
Bicarbonate: 25.7 mmol/L (ref 20.0–28.0)
Bicarbonate: 23.8 mmol/L (ref 20.0–28.0)
Bicarbonate: 24.2 mmol/L (ref 20.0–28.0)
Bicarbonate: 24 mmol/L (ref 20.0–28.0)
Bicarbonate: 23 mmol/L (ref 20.0–28.0)
Bicarbonate: 23.3 mmol/L (ref 20.0–28.0)
Bicarbonate: 19.7 mmol/L — ABNORMAL LOW (ref 20.0–28.0)
Calcium, Ion: 1.2 mmol/L (ref 1.15–1.40)
Calcium, Ion: 1.04 mmol/L — ABNORMAL LOW (ref 1.15–1.40)
Calcium, Ion: 1.11 mmol/L — ABNORMAL LOW (ref 1.15–1.40)
Calcium, Ion: 1.1 mmol/L — ABNORMAL LOW (ref 1.15–1.40)
Calcium, Ion: 1.18 mmol/L (ref 1.15–1.40)
Calcium, Ion: 1.15 mmol/L (ref 1.15–1.40)
Calcium, Ion: 1.09 mmol/L — ABNORMAL LOW (ref 1.15–1.40)
HCT: 39 % (ref 39.0–52.0)
HCT: 29 % — ABNORMAL LOW (ref 39.0–52.0)
HCT: 27 % — ABNORMAL LOW (ref 39.0–52.0)
HCT: 29 % — ABNORMAL LOW (ref 39.0–52.0)
HCT: 37 % — ABNORMAL LOW (ref 39.0–52.0)
HCT: 34 % — ABNORMAL LOW (ref 39.0–52.0)
HCT: 35 % — ABNORMAL LOW (ref 39.0–52.0)
Hemoglobin: 13.3 g/dL (ref 13.0–17.0)
Hemoglobin: 9.9 g/dL — ABNORMAL LOW (ref 13.0–17.0)
Hemoglobin: 9.2 g/dL — ABNORMAL LOW (ref 13.0–17.0)
Hemoglobin: 9.9 g/dL — ABNORMAL LOW (ref 13.0–17.0)
Hemoglobin: 12.6 g/dL — ABNORMAL LOW (ref 13.0–17.0)
Hemoglobin: 11.6 g/dL — ABNORMAL LOW (ref 13.0–17.0)
Hemoglobin: 11.9 g/dL — ABNORMAL LOW (ref 13.0–17.0)
O2 Saturation: 100 %
O2 Saturation: 100 %
O2 Saturation: 100 %
O2 Saturation: 100 %
O2 Saturation: 96 %
O2 Saturation: 99 %
O2 Saturation: 99 %
Patient temperature: 36.9
Patient temperature: 37
Patient temperature: 36
Potassium: 4.1 mmol/L (ref 3.5–5.1)
Potassium: 4.4 mmol/L (ref 3.5–5.1)
Potassium: 4.7 mmol/L (ref 3.5–5.1)
Potassium: 4.9 mmol/L (ref 3.5–5.1)
Potassium: 4.4 mmol/L (ref 3.5–5.1)
Potassium: 4.5 mmol/L (ref 3.5–5.1)
Potassium: 4 mmol/L (ref 3.5–5.1)
Sodium: 140 mmol/L (ref 135–145)
Sodium: 140 mmol/L (ref 135–145)
Sodium: 139 mmol/L (ref 135–145)
Sodium: 138 mmol/L (ref 135–145)
Sodium: 141 mmol/L (ref 135–145)
Sodium: 140 mmol/L (ref 135–145)
Sodium: 142 mmol/L (ref 135–145)
TCO2: 27 mmol/L (ref 22–32)
TCO2: 25 mmol/L (ref 22–32)
TCO2: 25 mmol/L (ref 22–32)
TCO2: 25 mmol/L (ref 22–32)
TCO2: 25 mmol/L (ref 22–32)
TCO2: 25 mmol/L (ref 22–32)
TCO2: 21 mmol/L — ABNORMAL LOW (ref 22–32)
pCO2 arterial: 51.8 mmHg — ABNORMAL HIGH (ref 32–48)
pCO2 arterial: 42.5 mmHg (ref 32–48)
pCO2 arterial: 40.1 mmHg (ref 32–48)
pCO2 arterial: 45.9 mmHg (ref 32–48)
pCO2 arterial: 52.1 mmHg — ABNORMAL HIGH (ref 32–48)
pCO2 arterial: 46 mmHg (ref 32–48)
pCO2 arterial: 29.8 mmHg — ABNORMAL LOW (ref 32–48)
pH, Arterial: 7.303 — ABNORMAL LOW (ref 7.35–7.45)
pH, Arterial: 7.355 (ref 7.35–7.45)
pH, Arterial: 7.39 (ref 7.35–7.45)
pH, Arterial: 7.327 — ABNORMAL LOW (ref 7.35–7.45)
pH, Arterial: 7.253 — ABNORMAL LOW (ref 7.35–7.45)
pH, Arterial: 7.313 — ABNORMAL LOW (ref 7.35–7.45)
pH, Arterial: 7.424 (ref 7.35–7.45)
pO2, Arterial: 351 mmHg — ABNORMAL HIGH (ref 83–108)
pO2, Arterial: 412 mmHg — ABNORMAL HIGH (ref 83–108)
pO2, Arterial: 419 mmHg — ABNORMAL HIGH (ref 83–108)
pO2, Arterial: 399 mmHg — ABNORMAL HIGH (ref 83–108)
pO2, Arterial: 97 mmHg (ref 83–108)
pO2, Arterial: 142 mmHg — ABNORMAL HIGH (ref 83–108)
pO2, Arterial: 119 mmHg — ABNORMAL HIGH (ref 83–108)

## 2022-10-10 LAB — POCT I-STAT EG7
Acid-Base Excess: 5 mmol/L — ABNORMAL HIGH (ref 0.0–2.0)
Bicarbonate: 31.1 mmol/L — ABNORMAL HIGH (ref 20.0–28.0)
Calcium, Ion: 1.03 mmol/L — ABNORMAL LOW (ref 1.15–1.40)
HCT: 27 % — ABNORMAL LOW (ref 39.0–52.0)
Hemoglobin: 9.2 g/dL — ABNORMAL LOW (ref 13.0–17.0)
O2 Saturation: 81 %
Potassium: 4 mmol/L (ref 3.5–5.1)
Sodium: 144 mmol/L (ref 135–145)
TCO2: 33 mmol/L — ABNORMAL HIGH (ref 22–32)
pCO2, Ven: 54.5 mmHg (ref 44–60)
pH, Ven: 7.365 (ref 7.25–7.43)
pO2, Ven: 47 mmHg — ABNORMAL HIGH (ref 32–45)

## 2022-10-10 LAB — GLUCOSE, CAPILLARY
Glucose-Capillary: 114 mg/dL — ABNORMAL HIGH (ref 70–99)
Glucose-Capillary: 111 mg/dL — ABNORMAL HIGH (ref 70–99)
Glucose-Capillary: 140 mg/dL — ABNORMAL HIGH (ref 70–99)
Glucose-Capillary: 136 mg/dL — ABNORMAL HIGH (ref 70–99)
Glucose-Capillary: 162 mg/dL — ABNORMAL HIGH (ref 70–99)

## 2022-10-10 LAB — SARS CORONAVIRUS 2 (TAT 6-24 HRS): SARS Coronavirus 2: NEGATIVE

## 2022-10-10 LAB — POCT I-STAT, CHEM 8
BUN: 20 mg/dL (ref 8–23)
BUN: 20 mg/dL (ref 8–23)
BUN: 18 mg/dL (ref 8–23)
BUN: 19 mg/dL (ref 8–23)
BUN: 19 mg/dL (ref 8–23)
Calcium, Ion: 1.27 mmol/L (ref 1.15–1.40)
Calcium, Ion: 1.24 mmol/L (ref 1.15–1.40)
Calcium, Ion: 1.13 mmol/L — ABNORMAL LOW (ref 1.15–1.40)
Calcium, Ion: 1.15 mmol/L (ref 1.15–1.40)
Calcium, Ion: 1.13 mmol/L — ABNORMAL LOW (ref 1.15–1.40)
Chloride: 104 mmol/L (ref 98–111)
Chloride: 104 mmol/L (ref 98–111)
Chloride: 105 mmol/L (ref 98–111)
Chloride: 107 mmol/L (ref 98–111)
Chloride: 106 mmol/L (ref 98–111)
Creatinine, Ser: 0.8 mg/dL (ref 0.61–1.24)
Creatinine, Ser: 0.8 mg/dL (ref 0.61–1.24)
Creatinine, Ser: 0.7 mg/dL (ref 0.61–1.24)
Creatinine, Ser: 0.8 mg/dL (ref 0.61–1.24)
Creatinine, Ser: 0.8 mg/dL (ref 0.61–1.24)
Glucose, Bld: 97 mg/dL (ref 70–99)
Glucose, Bld: 102 mg/dL — ABNORMAL HIGH (ref 70–99)
Glucose, Bld: 126 mg/dL — ABNORMAL HIGH (ref 70–99)
Glucose, Bld: 124 mg/dL — ABNORMAL HIGH (ref 70–99)
Glucose, Bld: 135 mg/dL — ABNORMAL HIGH (ref 70–99)
HCT: 37 % — ABNORMAL LOW (ref 39.0–52.0)
HCT: 35 % — ABNORMAL LOW (ref 39.0–52.0)
HCT: 26 % — ABNORMAL LOW (ref 39.0–52.0)
HCT: 27 % — ABNORMAL LOW (ref 39.0–52.0)
HCT: 28 % — ABNORMAL LOW (ref 39.0–52.0)
Hemoglobin: 12.6 g/dL — ABNORMAL LOW (ref 13.0–17.0)
Hemoglobin: 11.9 g/dL — ABNORMAL LOW (ref 13.0–17.0)
Hemoglobin: 8.8 g/dL — ABNORMAL LOW (ref 13.0–17.0)
Hemoglobin: 9.2 g/dL — ABNORMAL LOW (ref 13.0–17.0)
Hemoglobin: 9.5 g/dL — ABNORMAL LOW (ref 13.0–17.0)
Potassium: 4.1 mmol/L (ref 3.5–5.1)
Potassium: 4.2 mmol/L (ref 3.5–5.1)
Potassium: 4.4 mmol/L (ref 3.5–5.1)
Potassium: 5.2 mmol/L — ABNORMAL HIGH (ref 3.5–5.1)
Potassium: 4.9 mmol/L (ref 3.5–5.1)
Sodium: 140 mmol/L (ref 135–145)
Sodium: 139 mmol/L (ref 135–145)
Sodium: 138 mmol/L (ref 135–145)
Sodium: 138 mmol/L (ref 135–145)
Sodium: 138 mmol/L (ref 135–145)
TCO2: 28 mmol/L (ref 22–32)
TCO2: 28 mmol/L (ref 22–32)
TCO2: 27 mmol/L (ref 22–32)
TCO2: 26 mmol/L (ref 22–32)
TCO2: 26 mmol/L (ref 22–32)

## 2022-10-10 LAB — PLATELET COUNT: Platelets: 134 10*3/uL — ABNORMAL LOW (ref 150–400)

## 2022-10-10 LAB — CBC
HCT: 37.9 % — ABNORMAL LOW (ref 39.0–52.0)
Hemoglobin: 12.8 g/dL — ABNORMAL LOW (ref 13.0–17.0)
MCH: 32.7 pg (ref 26.0–34.0)
MCHC: 33.8 g/dL (ref 30.0–36.0)
MCV: 96.9 fL (ref 80.0–100.0)
Platelets: 142 10*3/uL — ABNORMAL LOW (ref 150–400)
RBC: 3.91 MIL/uL — ABNORMAL LOW (ref 4.22–5.81)
RDW: 12.2 % (ref 11.5–15.5)
WBC: 14 10*3/uL — ABNORMAL HIGH (ref 4.0–10.5)
nRBC: 0 % (ref 0.0–0.2)

## 2022-10-10 LAB — HEMOGLOBIN AND HEMATOCRIT, BLOOD
HCT: 29.3 % — ABNORMAL LOW (ref 39.0–52.0)
Hemoglobin: 9.8 g/dL — ABNORMAL LOW (ref 13.0–17.0)

## 2022-10-10 LAB — APTT: aPTT: 32 seconds (ref 24–36)

## 2022-10-10 LAB — PROTIME-INR
INR: 1.4 — ABNORMAL HIGH (ref 0.8–1.2)
Prothrombin Time: 16.9 seconds — ABNORMAL HIGH (ref 11.4–15.2)

## 2022-10-10 SURGERY — CORONARY ARTERY BYPASS GRAFTING (CABG)
Anesthesia: General | Site: Chest

## 2022-10-10 MED ORDER — CHLORHEXIDINE GLUCONATE 0.12 % MT SOLN: 15.0000 mL | Freq: Once | OROMUCOSAL | Status: AC

## 2022-10-10 MED ORDER — VANCOMYCIN HCL IN DEXTROSE 1-5 GM/200ML-% IV SOLN
1000.0000 mg | Freq: Once | INTRAVENOUS | Status: AC
  Administered 2022-10-10: 1000 mg via INTRAVENOUS
  Filled 2022-10-10: qty 200

## 2022-10-10 MED ORDER — ORAL CARE MOUTH RINSE: 15.0000 mL | OROMUCOSAL | Status: DC | PRN

## 2022-10-10 MED ORDER — ONDANSETRON HCL 4 MG/2ML IJ SOLN: 4.0000 mg | Freq: Four times a day (QID) | INTRAMUSCULAR | Status: DC | PRN

## 2022-10-10 MED ORDER — ASPIRIN 325 MG PO TBEC
325.0000 mg | DELAYED_RELEASE_TABLET | Freq: Every day | ORAL | Status: DC
  Administered 2022-10-11 – 2022-10-14 (×4): 325 mg via ORAL
  Filled 2022-10-10 (×4): qty 1

## 2022-10-10 MED ORDER — PLASMA-LYTE A IV SOLN: INTRAVENOUS | Status: DC | PRN

## 2022-10-10 MED ORDER — PROTAMINE SULFATE 10 MG/ML IV SOLN
INTRAVENOUS | Status: DC | PRN
  Administered 2022-10-10: 30 mg via INTRAVENOUS
  Administered 2022-10-10: 320 mg via INTRAVENOUS

## 2022-10-10 MED ORDER — MAGNESIUM SULFATE 4 GM/100ML IV SOLN
4.0000 g | Freq: Once | INTRAVENOUS | Status: AC
  Administered 2022-10-10: 4 g via INTRAVENOUS
  Filled 2022-10-10: qty 100

## 2022-10-10 MED ORDER — ROCURONIUM BROMIDE 10 MG/ML (PF) SYRINGE
PREFILLED_SYRINGE | INTRAVENOUS | Status: AC
  Filled 2022-10-10: qty 20

## 2022-10-10 MED ORDER — ROCURONIUM BROMIDE 10 MG/ML (PF) SYRINGE
PREFILLED_SYRINGE | INTRAVENOUS | Status: AC
  Filled 2022-10-10: qty 10

## 2022-10-10 MED ORDER — SODIUM CHLORIDE 0.9% FLUSH
3.0000 mL | Freq: Two times a day (BID) | INTRAVENOUS | Status: DC
  Administered 2022-10-11 – 2022-10-13 (×4): 3 mL via INTRAVENOUS

## 2022-10-10 MED ORDER — LACTATED RINGERS IV SOLN: INTRAVENOUS | Status: DC | PRN

## 2022-10-10 MED ORDER — DOCUSATE SODIUM 100 MG PO CAPS
200.0000 mg | ORAL_CAPSULE | Freq: Every day | ORAL | Status: DC
  Administered 2022-10-11 – 2022-10-13 (×3): 200 mg via ORAL
  Filled 2022-10-10 (×3): qty 2

## 2022-10-10 MED ORDER — ALBUMIN HUMAN 5 % IV SOLN
250.0000 mL | INTRAVENOUS | Status: DC | PRN
  Administered 2022-10-10 (×3): 12.5 g via INTRAVENOUS
  Filled 2022-10-10: qty 250

## 2022-10-10 MED ORDER — HEPARIN SODIUM (PORCINE) 1000 UNIT/ML IJ SOLN
INTRAMUSCULAR | Status: AC
  Filled 2022-10-10: qty 10

## 2022-10-10 MED ORDER — THROMBIN 20000 UNITS EX SOLR
OROMUCOSAL | Status: DC | PRN
  Administered 2022-10-10: 4 mL via TOPICAL

## 2022-10-10 MED ORDER — BISACODYL 10 MG RE SUPP: 10.0000 mg | Freq: Every day | RECTAL | Status: DC

## 2022-10-10 MED ORDER — DEXAMETHASONE SODIUM PHOSPHATE 10 MG/ML IJ SOLN
INTRAMUSCULAR | Status: DC | PRN
  Administered 2022-10-10: 4 mg via INTRAVENOUS

## 2022-10-10 MED ORDER — METOPROLOL TARTRATE 5 MG/5ML IV SOLN
INTRAVENOUS | Status: DC | PRN
  Administered 2022-10-10: 1 mg via INTRAVENOUS

## 2022-10-10 MED ORDER — PROPOFOL 10 MG/ML IV BOLUS
INTRAVENOUS | Status: AC
  Filled 2022-10-10: qty 20

## 2022-10-10 MED ORDER — MIDAZOLAM HCL (PF) 5 MG/ML IJ SOLN
INTRAMUSCULAR | Status: DC | PRN
  Administered 2022-10-10: 3 mg via INTRAVENOUS
  Administered 2022-10-10 (×2): 2 mg via INTRAVENOUS
  Administered 2022-10-10: 3 mg via INTRAVENOUS

## 2022-10-10 MED ORDER — FENTANYL CITRATE PF 50 MCG/ML IJ SOSY
50.0000 ug | PREFILLED_SYRINGE | INTRAMUSCULAR | Status: DC | PRN
  Administered 2022-10-10 – 2022-10-11 (×4): 50 ug via INTRAVENOUS
  Filled 2022-10-10 (×4): qty 1

## 2022-10-10 MED ORDER — HEPARIN SODIUM (PORCINE) 1000 UNIT/ML IJ SOLN
INTRAMUSCULAR | Status: DC | PRN
  Administered 2022-10-10: 38000 [IU] via INTRAVENOUS

## 2022-10-10 MED ORDER — THROMBIN (RECOMBINANT) 20000 UNITS EX SOLR
CUTANEOUS | Status: AC
  Filled 2022-10-10: qty 20000

## 2022-10-10 MED ORDER — ONDANSETRON HCL 4 MG/2ML IJ SOLN
INTRAMUSCULAR | Status: DC | PRN
  Administered 2022-10-10: 4 mg via INTRAVENOUS

## 2022-10-10 MED ORDER — OXYCODONE HCL 5 MG PO TABS
5.0000 mg | ORAL_TABLET | ORAL | Status: DC | PRN
  Administered 2022-10-11: 5 mg via ORAL
  Administered 2022-10-11 – 2022-10-13 (×9): 10 mg via ORAL
  Filled 2022-10-10: qty 2
  Filled 2022-10-10: qty 1
  Filled 2022-10-10 (×8): qty 2

## 2022-10-10 MED ORDER — SODIUM CHLORIDE 0.9 % IV SOLN: INTRAVENOUS | Status: DC | PRN

## 2022-10-10 MED ORDER — ORAL CARE MOUTH RINSE
15.0000 mL | OROMUCOSAL | Status: DC
  Administered 2022-10-10 – 2022-10-11 (×5): 15 mL via OROMUCOSAL

## 2022-10-10 MED ORDER — FENTANYL CITRATE (PF) 250 MCG/5ML IJ SOLN
INTRAMUSCULAR | Status: AC
  Filled 2022-10-10: qty 5

## 2022-10-10 MED ORDER — PANTOPRAZOLE SODIUM 40 MG PO TBEC
40.0000 mg | DELAYED_RELEASE_TABLET | Freq: Every day | ORAL | Status: DC
  Administered 2022-10-12 – 2022-10-14 (×3): 40 mg via ORAL
  Filled 2022-10-10 (×3): qty 1

## 2022-10-10 MED ORDER — ROCURONIUM BROMIDE 10 MG/ML (PF) SYRINGE
PREFILLED_SYRINGE | INTRAVENOUS | Status: DC | PRN
  Administered 2022-10-10: 50 mg via INTRAVENOUS
  Administered 2022-10-10: 100 mg via INTRAVENOUS
  Administered 2022-10-10: 50 mg via INTRAVENOUS

## 2022-10-10 MED ORDER — SODIUM BICARBONATE 8.4 % IV SOLN
50.0000 meq | Freq: Once | INTRAVENOUS | Status: AC
  Administered 2022-10-10: 50 meq via INTRAVENOUS

## 2022-10-10 MED ORDER — ACETAMINOPHEN 650 MG RE SUPP
650.0000 mg | Freq: Once | RECTAL | Status: AC
  Administered 2022-10-10: 650 mg via RECTAL

## 2022-10-10 MED ORDER — EPHEDRINE 5 MG/ML INJ
INTRAVENOUS | Status: AC
  Filled 2022-10-10: qty 5

## 2022-10-10 MED ORDER — CHLORHEXIDINE GLUCONATE 4 % EX SOLN: 30.0000 mL | CUTANEOUS | Status: DC

## 2022-10-10 MED ORDER — DEXAMETHASONE SODIUM PHOSPHATE 10 MG/ML IJ SOLN
INTRAMUSCULAR | Status: AC
  Filled 2022-10-10: qty 1

## 2022-10-10 MED ORDER — ACETAMINOPHEN 500 MG PO TABS
1000.0000 mg | ORAL_TABLET | Freq: Four times a day (QID) | ORAL | Status: DC
  Administered 2022-10-11 – 2022-10-14 (×12): 1000 mg via ORAL
  Filled 2022-10-10 (×13): qty 2

## 2022-10-10 MED ORDER — ASPIRIN 81 MG PO CHEW: 324.0000 mg | CHEWABLE_TABLET | Freq: Every day | ORAL | Status: DC

## 2022-10-10 MED ORDER — MIDAZOLAM HCL 2 MG/2ML IJ SOLN
INTRAMUSCULAR | Status: AC
  Filled 2022-10-10: qty 2

## 2022-10-10 MED ORDER — PHENYLEPHRINE HCL (PRESSORS) 10 MG/ML IV SOLN
INTRAVENOUS | Status: AC
  Filled 2022-10-10: qty 1

## 2022-10-10 MED ORDER — FENTANYL CITRATE (PF) 250 MCG/5ML IJ SOLN
INTRAMUSCULAR | Status: DC | PRN
  Administered 2022-10-10: 50 ug via INTRAVENOUS
  Administered 2022-10-10: 150 ug via INTRAVENOUS
  Administered 2022-10-10: 50 ug via INTRAVENOUS
  Administered 2022-10-10 (×4): 100 ug via INTRAVENOUS
  Administered 2022-10-10: 200 ug via INTRAVENOUS
  Administered 2022-10-10 (×4): 100 ug via INTRAVENOUS

## 2022-10-10 MED ORDER — MIDAZOLAM HCL 2 MG/2ML IJ SOLN: 2.0000 mg | Freq: Once | INTRAMUSCULAR | Status: AC

## 2022-10-10 MED ORDER — PANTOPRAZOLE SODIUM 40 MG IV SOLR
40.0000 mg | Freq: Every day | INTRAVENOUS | Status: AC
  Administered 2022-10-10 – 2022-10-11 (×2): 40 mg via INTRAVENOUS
  Filled 2022-10-10 (×2): qty 10

## 2022-10-10 MED ORDER — METOCLOPRAMIDE HCL 5 MG/ML IJ SOLN
10.0000 mg | Freq: Four times a day (QID) | INTRAMUSCULAR | Status: AC
  Administered 2022-10-10 – 2022-10-11 (×6): 10 mg via INTRAVENOUS
  Filled 2022-10-10 (×6): qty 2

## 2022-10-10 MED ORDER — FENTANYL CITRATE (PF) 100 MCG/2ML IJ SOLN
INTRAMUSCULAR | Status: AC
  Administered 2022-10-10: 50 ug via INTRAVENOUS
  Filled 2022-10-10: qty 2

## 2022-10-10 MED ORDER — METOPROLOL TARTRATE 25 MG/10 ML ORAL SUSPENSION: 12.5000 mg | Freq: Two times a day (BID) | ORAL | Status: DC

## 2022-10-10 MED ORDER — PHENYLEPHRINE HCL-NACL 20-0.9 MG/250ML-% IV SOLN
0.0000 ug/min | INTRAVENOUS | Status: DC
  Administered 2022-10-10: 60 ug/min via INTRAVENOUS
  Filled 2022-10-10 (×2): qty 250

## 2022-10-10 MED ORDER — SODIUM CHLORIDE 0.45 % IV SOLN: INTRAVENOUS | Status: DC | PRN

## 2022-10-10 MED ORDER — CHLORHEXIDINE GLUCONATE 0.12 % MT SOLN
15.0000 mL | OROMUCOSAL | Status: AC
  Administered 2022-10-10: 15 mL via OROMUCOSAL
  Filled 2022-10-10: qty 15

## 2022-10-10 MED ORDER — ACETAMINOPHEN 160 MG/5ML PO SOLN: 650.0000 mg | Freq: Once | ORAL | Status: DC

## 2022-10-10 MED ORDER — LIDOCAINE 2% (20 MG/ML) 5 ML SYRINGE
INTRAMUSCULAR | Status: AC
  Filled 2022-10-10: qty 5

## 2022-10-10 MED ORDER — SODIUM CHLORIDE 0.9 % IV SOLN: INTRAVENOUS | Status: DC

## 2022-10-10 MED ORDER — CHLORHEXIDINE GLUCONATE 0.12 % MT SOLN: 15.0000 mL | Freq: Once | OROMUCOSAL | Status: DC

## 2022-10-10 MED ORDER — DEXMEDETOMIDINE HCL IN NACL 400 MCG/100ML IV SOLN: 0.0000 ug/kg/h | INTRAVENOUS | Status: DC

## 2022-10-10 MED ORDER — SODIUM CHLORIDE 0.9 % IV SOLN: 250.0000 mL | INTRAVENOUS | Status: DC

## 2022-10-10 MED ORDER — POTASSIUM CHLORIDE 10 MEQ/50ML IV SOLN: 10.0000 meq | INTRAVENOUS | Status: AC

## 2022-10-10 MED ORDER — MIDAZOLAM HCL (PF) 10 MG/2ML IJ SOLN
INTRAMUSCULAR | Status: AC
  Filled 2022-10-10: qty 2

## 2022-10-10 MED ORDER — MIDAZOLAM HCL 2 MG/2ML IJ SOLN
INTRAMUSCULAR | Status: AC
  Administered 2022-10-10: 2 mg via INTRAVENOUS
  Filled 2022-10-10: qty 2

## 2022-10-10 MED ORDER — CEFAZOLIN SODIUM-DEXTROSE 2-4 GM/100ML-% IV SOLN
2.0000 g | Freq: Three times a day (TID) | INTRAVENOUS | Status: AC
  Administered 2022-10-10 – 2022-10-12 (×6): 2 g via INTRAVENOUS
  Filled 2022-10-10 (×6): qty 100

## 2022-10-10 MED ORDER — SODIUM CHLORIDE 0.9% FLUSH
3.0000 mL | INTRAVENOUS | Status: DC | PRN
  Administered 2022-10-11: 3 mL via INTRAVENOUS

## 2022-10-10 MED ORDER — LACTATED RINGERS IV SOLN: INTRAVENOUS | Status: DC

## 2022-10-10 MED ORDER — FENTANYL CITRATE (PF) 100 MCG/2ML IJ SOLN: 50.0000 ug | Freq: Once | INTRAMUSCULAR | Status: AC

## 2022-10-10 MED ORDER — THROMBIN 20000 UNITS EX SOLR
CUTANEOUS | Status: DC | PRN
  Administered 2022-10-10: 20000 [IU] via TOPICAL

## 2022-10-10 MED ORDER — ACETAMINOPHEN 160 MG/5ML PO SOLN: 1000.0000 mg | Freq: Four times a day (QID) | ORAL | Status: DC

## 2022-10-10 MED ORDER — PHENYLEPHRINE 80 MCG/ML (10ML) SYRINGE FOR IV PUSH (FOR BLOOD PRESSURE SUPPORT)
PREFILLED_SYRINGE | INTRAVENOUS | Status: AC
  Filled 2022-10-10: qty 10

## 2022-10-10 MED ORDER — ORAL CARE MOUTH RINSE: 15.0000 mL | Freq: Once | OROMUCOSAL | Status: AC

## 2022-10-10 MED ORDER — ~~LOC~~ CARDIAC SURGERY, PATIENT & FAMILY EDUCATION
Freq: Once | Status: DC
  Filled 2022-10-10: qty 1

## 2022-10-10 MED ORDER — PROTAMINE SULFATE 10 MG/ML IV SOLN
INTRAVENOUS | Status: AC
  Filled 2022-10-10: qty 5

## 2022-10-10 MED ORDER — ATORVASTATIN CALCIUM 40 MG PO TABS
40.0000 mg | ORAL_TABLET | Freq: Every evening | ORAL | Status: DC
  Administered 2022-10-11 – 2022-10-13 (×3): 40 mg via ORAL
  Filled 2022-10-10 (×3): qty 1

## 2022-10-10 MED ORDER — DEXTROSE 50 % IV SOLN: 0.0000 mL | INTRAVENOUS | Status: DC | PRN

## 2022-10-10 MED ORDER — INSULIN REGULAR(HUMAN) IN NACL 100-0.9 UT/100ML-% IV SOLN: INTRAVENOUS | Status: DC

## 2022-10-10 MED ORDER — METOPROLOL TARTRATE 5 MG/5ML IV SOLN
INTRAVENOUS | Status: AC
  Filled 2022-10-10: qty 5

## 2022-10-10 MED ORDER — ASPIRIN 81 MG PO CHEW: 324.0000 mg | CHEWABLE_TABLET | Freq: Once | ORAL | Status: DC

## 2022-10-10 MED ORDER — PROPOFOL 10 MG/ML IV BOLUS
INTRAVENOUS | Status: DC | PRN
Start: 2022-10-10 — End: 2022-10-10
  Administered 2022-10-10: 70 mg via INTRAVENOUS

## 2022-10-10 MED ORDER — ONDANSETRON HCL 4 MG/2ML IJ SOLN
INTRAMUSCULAR | Status: AC
  Filled 2022-10-10: qty 2

## 2022-10-10 MED ORDER — CHLORHEXIDINE GLUCONATE 0.12 % MT SOLN
OROMUCOSAL | Status: AC
  Administered 2022-10-10: 15 mL via OROMUCOSAL
  Filled 2022-10-10: qty 15

## 2022-10-10 MED ORDER — METOPROLOL TARTRATE 12.5 MG HALF TABLET: 12.5000 mg | ORAL_TABLET | Freq: Once | ORAL | Status: DC

## 2022-10-10 MED ORDER — HEMOSTATIC AGENTS (NO CHARGE) OPTIME
TOPICAL | Status: DC | PRN
  Administered 2022-10-10: 1 via TOPICAL

## 2022-10-10 MED ORDER — MIDAZOLAM HCL 2 MG/2ML IJ SOLN: 2.0000 mg | INTRAMUSCULAR | Status: DC | PRN

## 2022-10-10 MED ORDER — PROTAMINE SULFATE 10 MG/ML IV SOLN
INTRAVENOUS | Status: AC
  Filled 2022-10-10: qty 25

## 2022-10-10 MED ORDER — PHENYLEPHRINE 80 MCG/ML (10ML) SYRINGE FOR IV PUSH (FOR BLOOD PRESSURE SUPPORT)
PREFILLED_SYRINGE | INTRAVENOUS | Status: DC | PRN
  Administered 2022-10-10: 80 ug via INTRAVENOUS
  Administered 2022-10-10: 160 ug via INTRAVENOUS
  Administered 2022-10-10: 40 ug via INTRAVENOUS

## 2022-10-10 MED ORDER — METOPROLOL TARTRATE 5 MG/5ML IV SOLN: 2.5000 mg | INTRAVENOUS | Status: DC | PRN

## 2022-10-10 MED ORDER — CHLORHEXIDINE GLUCONATE CLOTH 2 % EX PADS
6.0000 | MEDICATED_PAD | Freq: Every day | CUTANEOUS | Status: DC
  Administered 2022-10-10 – 2022-10-13 (×4): 6 via TOPICAL

## 2022-10-10 MED ORDER — METOPROLOL TARTRATE 12.5 MG HALF TABLET
12.5000 mg | ORAL_TABLET | Freq: Two times a day (BID) | ORAL | Status: DC
  Administered 2022-10-11: 12.5 mg via ORAL
  Filled 2022-10-10: qty 1

## 2022-10-10 MED ORDER — LIDOCAINE HCL (CARDIAC) PF 100 MG/5ML IV SOSY
PREFILLED_SYRINGE | INTRAVENOUS | Status: DC | PRN
  Administered 2022-10-10: 100 mg via INTRATRACHEAL

## 2022-10-10 MED ORDER — BISACODYL 5 MG PO TBEC
10.0000 mg | DELAYED_RELEASE_TABLET | Freq: Every day | ORAL | Status: DC
  Administered 2022-10-11 – 2022-10-13 (×3): 10 mg via ORAL
  Filled 2022-10-10 (×3): qty 2

## 2022-10-10 MED ORDER — ALBUMIN HUMAN 5 % IV SOLN: INTRAVENOUS | Status: DC | PRN

## 2022-10-10 MED ORDER — HEPARIN SODIUM (PORCINE) 1000 UNIT/ML IJ SOLN
INTRAMUSCULAR | Status: AC
  Filled 2022-10-10: qty 1

## 2022-10-10 MED ORDER — CITALOPRAM HYDROBROMIDE 20 MG PO TABS
20.0000 mg | ORAL_TABLET | Freq: Every day | ORAL | Status: DC
  Administered 2022-10-11 – 2022-10-14 (×4): 20 mg via ORAL
  Filled 2022-10-10 (×4): qty 1

## 2022-10-10 SURGICAL SUPPLY — 128 items
ADAPTER CARDIO PERF ANTE/RETRO (ADAPTER) ×2 IMPLANT
ADH SKN CLS APL DERMABOND .7 (GAUZE/BANDAGES/DRESSINGS) ×2
ADPR PRFSN 84XANTGRD RTRGD (ADAPTER) ×2
BAG DECANTER FOR FLEXI CONT (MISCELLANEOUS) ×2 IMPLANT
BLADE CLIPPER SURG (BLADE) ×2 IMPLANT
BLADE STERNUM SYSTEM 6 (BLADE) ×2 IMPLANT
BLADE SURG 15 STRL LF DISP TIS (BLADE) ×2 IMPLANT
BLADE SURG 15 STRL SS (BLADE) ×2
BNDG CMPR 5X4 KNIT ELC UNQ LF (GAUZE/BANDAGES/DRESSINGS) ×2
BNDG CMPR 5X62 HK CLSR LF (GAUZE/BANDAGES/DRESSINGS) ×2
BNDG CMPR 6 X 5 YARDS HK CLSR (GAUZE/BANDAGES/DRESSINGS) ×2
BNDG CMPR 6"X 5 YARDS HK CLSR (GAUZE/BANDAGES/DRESSINGS) ×2
BNDG ELASTIC 4INX 5YD STR LF (GAUZE/BANDAGES/DRESSINGS) IMPLANT
BNDG ELASTIC 6INX 5YD STR LF (GAUZE/BANDAGES/DRESSINGS) IMPLANT
BNDG GAUZE DERMACEA FLUFF 4 (GAUZE/BANDAGES/DRESSINGS) ×2 IMPLANT
BNDG GZE DERMACEA 4 6PLY (GAUZE/BANDAGES/DRESSINGS) ×2
CANISTER SUCT 3000ML PPV (MISCELLANEOUS) ×2 IMPLANT
CANNULA ARTERIAL VENT 3/8 20FR (CANNULA) IMPLANT
CANNULA GUNDRY RCSP 15FR (MISCELLANEOUS) ×2 IMPLANT
CANNULA MC2 2 STG 36/46 NON-V (CANNULA) IMPLANT
CANNULA SUMP PERICARDIAL (CANNULA) IMPLANT
CATH HEART VENT LEFT (CATHETERS) ×2 IMPLANT
CATH ROBINSON RED A/P 18FR (CATHETERS) ×6 IMPLANT
CATH THORACIC 28FR (CATHETERS) ×2 IMPLANT
CATH THORACIC 36FR (CATHETERS) ×2 IMPLANT
CATH THORACIC 36FR RT ANG (CATHETERS) ×2 IMPLANT
CLIP TI MEDIUM 24 (CLIP) IMPLANT
CLIP TI WIDE RED SMALL 24 (CLIP) IMPLANT
CNTNR URN SCR LID CUP LEK RST (MISCELLANEOUS) ×2 IMPLANT
CONN Y 3/8X3/8X3/8 BEN (MISCELLANEOUS) IMPLANT
CONT SPEC 4OZ STRL OR WHT (MISCELLANEOUS) ×2
CONTAINER PROTECT SURGISLUSH (MISCELLANEOUS) ×4 IMPLANT
COVER SURGICAL LIGHT HANDLE (MISCELLANEOUS) ×2 IMPLANT
DERMABOND ADVANCED .7 DNX12 (GAUZE/BANDAGES/DRESSINGS) IMPLANT
DEVICE SUT CK QUICK LOAD MINI (Prosthesis & Implant Heart) IMPLANT
DRAPE CARDIOVASCULAR INCISE (DRAPES) ×2
DRAPE SRG 135X102X78XABS (DRAPES) ×2 IMPLANT
DRAPE WARM FLUID 44X44 (DRAPES) ×2 IMPLANT
DRSG COVADERM 4X14 (GAUZE/BANDAGES/DRESSINGS) ×2 IMPLANT
ELECT CAUTERY BLADE 6.4 (BLADE) ×2 IMPLANT
ELECT REM PT RETURN 9FT ADLT (ELECTROSURGICAL) ×2
ELECTRODE REM PT RTRN 9FT ADLT (ELECTROSURGICAL) ×4 IMPLANT
FELT TEFLON 1X6 (MISCELLANEOUS) ×4 IMPLANT
GAUZE 4X4 16PLY ~~LOC~~+RFID DBL (SPONGE) ×2 IMPLANT
GAUZE SPONGE 4X4 12PLY STRL (GAUZE/BANDAGES/DRESSINGS) ×4 IMPLANT
GLOVE BIO SURGEON STRL SZ 6 (GLOVE) IMPLANT
GLOVE BIO SURGEON STRL SZ 6.5 (GLOVE) IMPLANT
GLOVE BIO SURGEON STRL SZ7 (GLOVE) IMPLANT
GLOVE BIO SURGEON STRL SZ7.5 (GLOVE) IMPLANT
GLOVE BIOGEL PI IND STRL 6.5 (GLOVE) IMPLANT
GLOVE BIOGEL PI IND STRL 7.0 (GLOVE) IMPLANT
GLOVE ECLIPSE 7.0 STRL STRAW (GLOVE) ×4 IMPLANT
GLOVE ORTHO TXT STRL SZ7.5 (GLOVE) IMPLANT
GLOVE SURG MICRO LTX SZ7 (GLOVE) ×4 IMPLANT
GOWN STRL REUS W/ TWL LRG LVL3 (GOWN DISPOSABLE) ×8 IMPLANT
GOWN STRL REUS W/ TWL XL LVL3 (GOWN DISPOSABLE) ×2 IMPLANT
GOWN STRL REUS W/TWL LRG LVL3 (GOWN DISPOSABLE) ×8
GOWN STRL REUS W/TWL XL LVL3 (GOWN DISPOSABLE) ×6
HEMOSTAT POWDER SURGIFOAM 1G (HEMOSTASIS) ×6 IMPLANT
HEMOSTAT SURGICEL 2X14 (HEMOSTASIS) ×2 IMPLANT
INSERT FOGARTY 61MM (MISCELLANEOUS) IMPLANT
INSERT FOGARTY XLG (MISCELLANEOUS) IMPLANT
KIT BASIN OR (CUSTOM PROCEDURE TRAY) ×2 IMPLANT
KIT CATH CPB BARTLE (MISCELLANEOUS) ×2 IMPLANT
KIT SUCTION CATH 14FR (SUCTIONS) ×2 IMPLANT
KIT SUT CK MINI COMBO 4X17 (Prosthesis & Implant Heart) IMPLANT
KIT TURNOVER KIT B (KITS) ×2 IMPLANT
KIT VASOVIEW HEMOPRO 2 VH 4000 (KITS) ×2 IMPLANT
KNIFE MICRO-UNI 3.5 30 DEG (BLADE) ×2 IMPLANT
LINE VENT (MISCELLANEOUS) IMPLANT
NS IRRIG 1000ML POUR BTL (IV SOLUTION) ×12 IMPLANT
PACK E OPEN HEART (SUTURE) ×2 IMPLANT
PACK OPEN HEART (CUSTOM PROCEDURE TRAY) ×2 IMPLANT
PAD ARMBOARD 7.5X6 YLW CONV (MISCELLANEOUS) ×4 IMPLANT
PAD ELECT DEFIB RADIOL ZOLL (MISCELLANEOUS) ×2 IMPLANT
PENCIL BUTTON HOLSTER BLD 10FT (ELECTRODE) ×2 IMPLANT
POSITIONER HEAD DONUT 9IN (MISCELLANEOUS) ×2 IMPLANT
PUNCH AORTIC ROTATE 4.0MM (MISCELLANEOUS) IMPLANT
PUNCH AORTIC ROTATE 4.5MM 8IN (MISCELLANEOUS) ×2 IMPLANT
PUNCH AORTIC ROTATE 5MM 8IN (MISCELLANEOUS) IMPLANT
SET MPS 3-ND DEL (MISCELLANEOUS) IMPLANT
SPONGE INTESTINAL PEANUT (DISPOSABLE) IMPLANT
SPONGE T-LAP 18X18 ~~LOC~~+RFID (SPONGE) ×8 IMPLANT
SPONGE T-LAP 4X18 ~~LOC~~+RFID (SPONGE) ×2 IMPLANT
SUPPORT HEART JANKE-BARRON (MISCELLANEOUS) ×2 IMPLANT
SUT BONE WAX W31G (SUTURE) ×2 IMPLANT
SUT EB EXC GRN/WHT 2-0 V-5 (SUTURE) ×4 IMPLANT
SUT ETHIBOND 2 0 SH (SUTURE) ×2
SUT ETHIBOND 2 0 SH 36X2 (SUTURE) IMPLANT
SUT ETHIBOND V-5 VALVE (SUTURE) IMPLANT
SUT MNCRL AB 4-0 PS2 18 (SUTURE) IMPLANT
SUT PROLENE 3 0 SH DA (SUTURE) IMPLANT
SUT PROLENE 3 0 SH1 36 (SUTURE) ×2 IMPLANT
SUT PROLENE 4 0 RB 1 (SUTURE) ×8
SUT PROLENE 4 0 SH DA (SUTURE) IMPLANT
SUT PROLENE 4-0 RB1 .5 CRCL 36 (SUTURE) ×6 IMPLANT
SUT PROLENE 5 0 C 1 36 (SUTURE) IMPLANT
SUT PROLENE 6 0 C 1 30 (SUTURE) IMPLANT
SUT PROLENE 7 0 BV 1 (SUTURE) IMPLANT
SUT PROLENE 7 0 BV1 MDA (SUTURE) ×2 IMPLANT
SUT PROLENE 8 0 BV175 6 (SUTURE) IMPLANT
SUT SILK 1 MH (SUTURE) IMPLANT
SUT SILK 2 0 SH (SUTURE) IMPLANT
SUT SILK 2 0 SH CR/8 (SUTURE) IMPLANT
SUT STEEL 6MS V (SUTURE) IMPLANT
SUT STEEL STERNAL CCS#1 18IN (SUTURE) IMPLANT
SUT STEEL SZ 6 DBL 3X14 BALL (SUTURE) IMPLANT
SUT VIC AB 1 CTX 36 (SUTURE) ×4
SUT VIC AB 1 CTX36XBRD ANBCTR (SUTURE) ×4 IMPLANT
SUT VIC AB 2-0 CT1 27 (SUTURE) ×2
SUT VIC AB 2-0 CT1 TAPERPNT 27 (SUTURE) IMPLANT
SUT VIC AB 2-0 CTX 27 (SUTURE) IMPLANT
SUT VIC AB 3-0 SH 27 (SUTURE)
SUT VIC AB 3-0 SH 27X BRD (SUTURE) IMPLANT
SUT VIC AB 3-0 X1 27 (SUTURE) IMPLANT
SYSTEM SAHARA CHEST DRAIN ATS (WOUND CARE) ×2 IMPLANT
TAPE CLOTH SURG 4X10 WHT LF (GAUZE/BANDAGES/DRESSINGS) IMPLANT
TAPE PAPER 2X10 WHT MICROPORE (GAUZE/BANDAGES/DRESSINGS) IMPLANT
TOWEL GREEN STERILE (TOWEL DISPOSABLE) ×2 IMPLANT
TOWEL GREEN STERILE FF (TOWEL DISPOSABLE) ×2 IMPLANT
TRAY FOLEY SLVR 16FR TEMP STAT (SET/KITS/TRAYS/PACK) ×2 IMPLANT
TUBE SUCT INTRACARD DLP 20F (MISCELLANEOUS) IMPLANT
TUBE SUCTION CARDIAC 10FR (CANNULA) IMPLANT
TUBING LAP HI FLOW INSUFFLATIO (TUBING) ×2 IMPLANT
UNDERPAD 30X36 HEAVY ABSORB (UNDERPADS AND DIAPERS) ×2 IMPLANT
VALVE AORTIC SZ25 INSP/RESIL (Prosthesis & Implant Heart) IMPLANT
VENT LEFT HEART 12002 (CATHETERS) ×2
WATER STERILE IRR 1000ML POUR (IV SOLUTION) ×4 IMPLANT

## 2022-10-10 NOTE — Anesthesia Procedure Notes (Signed)
Central Venous Catheter Insertion Performed by: Collene Schlichter, MD, anesthesiologist Start/End7/03/2023 8:30 AM, 10/10/2022 8:33 AM Patient location: Pre-op. Preanesthetic checklist: patient identified, IV checked, site marked, risks and benefits discussed, surgical consent, monitors and equipment checked, pre-op evaluation and timeout performed Position: Trendelenburg Hand hygiene performed  and maximum sterile barriers used  Total catheter length 100. PA cath was placed.Swan type:thermodilution PA Cath depth:55 Procedure performed without using ultrasound guided technique. Attempts: 1 Patient tolerated the procedure well with no immediate complications.

## 2022-10-10 NOTE — Procedures (Signed)
Extubation Procedure Note  Patient Details:   Name: Jon Jackson DOB: [birth year] MRN: [id number]   Airway Documentation:    Vent end date: 10/10/22 Vent end time: 2212   Evaluation  O2 sats: stable throughout Complications: No apparent complications Patient did tolerate procedure well. Bilateral Breath Sounds: Clear, Diminished   Yes Pt extubated to 4L [place] per rapid wean protocol. NIF -40, VC 1.98L.  RN at bedside, cuff leak present prior to extubation, no stridor post extubation, and pt able to speak in complete sentences.   Allena Napoleon 10/10/2022, 10:13 PM

## 2022-10-10 NOTE — Anesthesia Procedure Notes (Signed)
Arterial Line Insertion Start/End7/03/2023 8:33 AM, 10/10/2022 8:41 AM Performed by: Collene Schlichter, MD, anesthesiologist  Patient location: Pre-op. Preanesthetic checklist: patient identified, IV checked, site marked, risks and benefits discussed, surgical consent, monitors and equipment checked, pre-op evaluation, timeout performed and anesthesia consent Lidocaine 1% used for infiltration Left, radial was placed Catheter size: 20 G Hand hygiene performed  and maximum sterile barriers used   Attempts: 2 Procedure performed using ultrasound guided technique. Ultrasound Notes:anatomy identified, needle tip was noted to be adjacent to the nerve/plexus identified, no ultrasound evidence of intravascular and/or intraneural injection and image(s) printed for medical record Following insertion, dressing applied and Biopatch. Post procedure assessment: normal and unchanged  Post procedure complications: second provider assisted and unsuccessful attempts. Patient tolerated the procedure well with no immediate complications.

## 2022-10-10 NOTE — Brief Op Note (Signed)
10/10/2022  12:18 PM  PATIENT:  Jon Jackson  72 y.o. male  PRE-OPERATIVE DIAGNOSIS:  SEVERE AORTIC VALVE STENOSIS CORONARY ARTERY DISEASE  POST-OPERATIVE DIAGNOSIS:  SEVERE AORTIC VALVE STENOSIS CORONARY ARTERY DISEASE  PROCEDURE:  CORONARY ARTERY BYPASS GRAFTING (CABG) X 1 USING ENDOSCOPICALY HARVESTED RIGHT GREATER SAPHENOUS VEIN   SVG-RCA  AORTIC VALVE REPLACEMENT USING 25 MM INSPIRIS AORTIC VALVE   TRANSESOPHAGEAL ECHOCARDIOGRAM  Vein harvest time: Vein prep time:  SURGEON:  Alleen Borne, MD - Primary  PHYSICIAN ASSISTANT: Carianne Taira  ASSISTANTSTanda Rockers, RN, RN First Assistant         Williams, Sadie, Scrub Person   ANESTHESIA:   general  EBL:   BLOOD ADMINISTERED:none  DRAINS:  Left pleural and mediastinal drains    LOCAL MEDICATIONS USED:  NONE  SPECIMEN:  aortic valve leaflets  DISPOSITION OF SPECIMEN:  PATHOLOGY  COUNTS:  Correct  DICTATION: .Dragon Dictation  PLAN OF CARE: Admit to inpatient   PATIENT DISPOSITION:  ICU - intubated and hemodynamically stable.   Delay start of Pharmacological VTE agent (>24hrs) due to surgical blood loss or risk of bleeding: yes

## 2022-10-10 NOTE — Op Note (Signed)
CARDIOVASCULAR SURGERY OPERATIVE NOTE  10/10/2022  Surgeon:  Alleen Borne, MD  First Assistant: Jillyn Hidden,  PA-C:   An experienced assistant was required given the complexity of this surgery and the standard of surgical care. The assistant was needed for endoscopic vein harvest, exposure, dissection, suctioning, retraction of delicate tissues and sutures, instrument exchange and for overall help during this procedure.   Preoperative Diagnosis:  Severe single-vessel coronary artery disease and severe aortic stenosis.   Postoperative Diagnosis:  Same   Procedure:  Median Sternotomy Extracorporeal circulation 3.   Coronary artery bypass grafting x 1   SVG to RCA  4.   Endoscopic vein harvest from the right leg 5.   Aortic valve replacement using a 25 mm Edwards INSPIRIS RESILIA pericardial valve   Anesthesia:  General Endotracheal   Clinical History/Surgical Indication:  He has single-vessel coronary artery disease presenting with NSTEMI and severe aortic stenosis that has progressed significantly in the past 3 years. There is also significant aortic valvular calcification.  He remains fairly active.  He is stable and not having chest pain or shortness of breath at rest. Expect he would gain the most long-term benefit from surgical valve replacement and CABG over TAVR and PCI.   Both options were discussed with him and wife. He would like to have open surgical AVR and CABG. I discussed the operative procedure with the patient and his wife including alternatives, benefits and risks; including but not limited to bleeding, blood transfusion, infection, stroke, myocardial infarction, graft failure, heart block requiring a permanent pacemaker, organ dysfunction, and death.  Lucilla Edin understands and agrees to proceed.       Preparation:  The patient was seen in the preoperative  holding area and the correct patient, correct operation were confirmed with the patient after reviewing the medical record and catheterization. The consent was signed by me. Preoperative antibiotics were given. A pulmonary arterial line and radial arterial line were placed by the anesthesia team. The patient was taken back to the operating room and positioned supine on the operating room table. After being placed under general endotracheal anesthesia by the anesthesia team a foley catheter was placed. The neck, chest, abdomen, and both legs were prepped with betadine soap and solution and draped in the usual sterile manner. A surgical time-out was taken and the correct patient and operative procedure were confirmed with the nursing and anesthesia staff.   Cardiopulmonary Bypass:  A median sternotomy was performed. The pericardium was opened in the midline. Right ventricular function appeared normal. The ascending aorta was of normal size and had no palpable plaque. There were no contraindications to aortic cannulation or cross-clamping. The patient was fully systemically heparinized and the ACT was maintained > 400 sec. The proximal aortic arch was cannulated with a 20 F aortic cannula for arterial inflow. Venous cannulation was performed via the right atrial appendage using a two-staged venous cannula. An antegrade cardioplegia/vent cannula was inserted into the mid-ascending aorta. Aortic occlusion was performed with a single cross-clamp. Systemic cooling to 32 degrees Centigrade and topical cooling of the heart with iced saline were used. Cold antegrade KBC cardioplegia was used to induce diastolic arrest and was then given retrograde at about 60 minute intervals throughout the period of arrest to maintain myocardial temperature at or below 10 degrees centigrade. A temperature probe was inserted into the interventricular septum and an insulating pad was placed in the pericardium.   Endoscopic vein  harvest: Performed by Jillyn Hidden, PA-C  The right greater saphenous vein was harvested endoscopically through a 2 cm incision medial to the right knee. It was harvested from the thigh.  It was a medium-sized vein of good quality. The side branches were all ligated with 4-0 silk ties.    Coronary arteries:  The coronary arteries were examined.  LAD:  no disease LCX:  no disease RCA:  large vessel with focal mid vessel disease   Grafts: All bypasses performed by me and assisted by Jillyn Hidden, PA-C  SVG to RCA:  3.5 mm. It was sewn end to side using 7-0 prolene continuous suture.  The proximal vein graft anastomosis was performed to the mid-ascending aorta using continuous 6-0 prolene suture. A graft marker was placed around the proximal anastomosis.  Aortic Valve Replacement: Performed by me and assisted by Jillyn Hidden, PA-C  A transverse aortotomy was performed 1 cm above the take-off of the right coronary artery. The native valve was tricuspid with severely calcified leaflets and moderate annular calcification. The ostia of the coronary arteries were in normal position and were not obstructed. The native valve leaflets were excised and the annulus was decalcified with rongeurs. Care was taken to remove all particulate debris. The left ventricle was directly inspected for debris and then irrigated with ice saline solution. The annulus was sized and a size 25 mm Edwards INSPIRIS RESILIA pericardial valve was chosen. The model number was [id number] and the serial number was [id number]. While the valve was being prepared 2-0 Ethibond pledgeted horizontal mattress sutures were placed around the annulus with the pledgets in a sub-annular position. The sutures were placed through the sewing ring and the valve lowered into place. The sutures were tied using CorKnots. The valve seated nicely and the coronary ostia were not obstructed. The prosthetic valve leaflets moved normally and there  was no sub-valvular obstruction. The aortotomy was closed using 4-0 Prolene suture in 2 layers with felt strips to reinforce the closure.  Completion:  The patient was rewarmed to 37 degrees Centigrade. The clamp was removed from the LIMA pedicle and there was rapid warming of the septum and return of ventricular fibrillation. The crossclamp was removed with a time of 99 minutes. There was spontaneous return of sinus rhythm. The distal and proximal anastomoses were checked for hemostasis. The position of the grafts was satisfactory. Two temporary epicardial pacing wires were placed on the right atrium and two on the right ventricle. The patient was weaned from CPB without difficulty on no inotropes. CPB time was 124 minutes. Cardiac output was 5 LPM. TEE showed normal prosthetic valve function with no paravalvular leak or regurgitation. LV systolic function was normal. Heparin was fully reversed with protamine and the aortic and venous cannulas removed. Hemostasis was achieved. Mediastinal drainage tubes were placed. The sternum was closed with double #6 stainless steel wires. The fascia was closed with continuous # 1 vicryl suture. The subcutaneous tissue was closed with 2-0 vicryl continuous suture. The skin was closed with 3-0 vicryl subcuticular suture. All sponge, needle, and instrument counts were reported correct at the end of the case. Dry sterile dressings were placed over the incisions and around the chest tubes which were connected to pleurevac suction. The patient was then transported to the surgical intensive care unit in stable condition.

## 2022-10-10 NOTE — Anesthesia Procedure Notes (Signed)
Central Venous Catheter Insertion Performed by: Collene Schlichter, MD, anesthesiologist Start/End7/03/2023 8:20 AM, 10/10/2022 8:30 AM Patient location: Pre-op. Preanesthetic checklist: patient identified, IV checked, site marked, risks and benefits discussed, surgical consent, monitors and equipment checked, pre-op evaluation, timeout performed and anesthesia consent Position: Trendelenburg Lidocaine 1% used for infiltration and patient sedated Hand hygiene performed , maximum sterile barriers used  and Seldinger technique used Catheter size: 9 Fr Central line was placed.MAC introducer Procedure performed using ultrasound guided technique. Ultrasound Notes:anatomy identified, needle tip was noted to be adjacent to the nerve/plexus identified, no ultrasound evidence of intravascular and/or intraneural injection and image(s) printed for medical record Attempts: 1 Following insertion, line sutured, dressing applied and Biopatch. Post procedure assessment: free fluid flow, blood return through all ports and no air  Patient tolerated the procedure well with no immediate complications.

## 2022-10-10 NOTE — Anesthesia Procedure Notes (Signed)
Procedure Name: Intubation Date/Time: 10/10/2022 9:08 AM  Performed by: Alease Medina, CRNAPre-anesthesia Checklist: Patient identified, Emergency Drugs available, Suction available, Patient being monitored and Timeout performed Patient Re-evaluated:Patient Re-evaluated prior to induction Oxygen Delivery Method: Circle system utilized Preoxygenation: Pre-oxygenation with 100% oxygen Induction Type: IV induction Ventilation: Two handed mask ventilation required and Oral airway inserted - appropriate to patient size Laryngoscope Size: Mac and 4 Grade View: Grade I Tube type: Oral Tube size: 8.0 mm Number of attempts: 1 Airway Equipment and Method: Stylet Placement Confirmation: ETT inserted through vocal cords under direct vision, positive ETCO2, CO2 detector and breath sounds checked- equal and bilateral Secured at: 23 cm Tube secured with: Tape Dental Injury: Teeth and Oropharynx as per pre-operative assessment

## 2022-10-10 NOTE — Hospital Course (Addendum)
Referring: Jon Rodriguez, MD  Primary Care: Tommie Sams, DO  History of Present Illness:     [name] is a 72 year old gentleman with past history notable for atrial fibrillation status post ablation in 2018 with successful conversion to sinus rhythm.  He has a history of hypertension and dyslipidemia along with anxiety and depression.  He was in his usual state of health last evening when he developed chest pain radiating to his left arm associated with nausea and diaphoresis.  He was brought to the [facility] emergency room.  Initial high-sensitivity troponin was 61 and later rose to 485.  Other workup including a chest x-ray was unremarkable.  EKG showed question of an old inferior myocardial infarction.  He was seen by Dr. Algie Coffer.  He was admitted to the hospital with acute non-ST elevation myocardial infarction and started on a heparin infusion.  He underwent left heart catheterization earlier today demonstrating a 70% proximal right coronary artery stenosis, normal LVEDP, severe aortic stenosis, and a 2+ aortic insufficiency.  Echocardiogram performed today confirms severe aortic stenosis with severe valvular calcification and mild AI. The mean aortic valve gradient today is with a peak gradient of 60.82mmHg. I do not see a calculate aortic valve area on the Echo done today.  Left ventricular ejection fraction by echo is 55-60%. RV function is preserved.   Jon Jackson has single-vessel coronary artery disease presenting with NSTEMI and severe aortic stenosis that has progressed significantly in the past 3 years. There is also significant aortic valvular calcification.  He remains fairly active.  He is stable and not having chest pain or shortness of breath at rest. Expect he would gain the most long-term benefit from surgical valve replacement and CABG over TAVR and PCI.   Both options were discussed with him and wife. He would like to have open surgical AVR and CABG. I discussed  the operative procedure with the patient and his wife including alternatives, benefits and risks; including but not limited to bleeding, blood transfusion, infection, stroke, myocardial infarction, graft failure, heart block requiring a permanent pacemaker, organ dysfunction, and death.  Jon Jackson understands and agrees to proceed.    Hospital Course: Jon Jackson was discharged home following left heart catheterization.  He was readmitted for elective surgery on 10/10/2022 and taken to the operating room where aortic valve replacement was carried out utilizing a 25 mm bioprosthetic valve.  Single-vessel coronary bypass grafting was also carried out with saphenous vein grafted to distal RCA.  Following the procedure, he separated from cardiopulmonary bypass without difficulty.  He was transferred to the surgical ICU in stable condition. He was extubated late the evening of surgery. Theone Murdoch, a line, chest tubes and foley were removed early in his post op course. He was transitioned off the Insulin drip. His pre op HGA1C was 5.2.  He was started on Lopressor and this was titrated accordingly. He was volume overloaded and diuresed. He had expected post op blood loss anemia. He did not require a post op transfusion. He also had mild thrombocytopenia. He was felt surgically stable for transfer from the ICU to 4E on 07/14.  Epicardial pacing wires were removed on postop day 3.  Incisions are noted to be healing well without evidence of infection.  He is tolerating diet and routine cardiac rehab modalities.  Oxygen was weaned and he maintains good saturations on room air.  He has remained afebrile.  Overall, at the time of discharge  the patient is felt to be quite stable.

## 2022-10-10 NOTE — Transfer of Care (Signed)
Immediate Anesthesia Transfer of Care Note  Patient: Jon Jackson  Procedure(s) Performed: CORONARY ARTERY BYPASS GRAFTING (CABG) X 1 USING ENDOSCOPICALY HARVESTED RIGHT GREATER SAPHENOUS VEIN (Chest) AORTIC VALVE REPLACEMENT USING 25 MM INSPIRIS AORTIC VALVE (Chest) TRANSESOPHAGEAL ECHOCARDIOGRAM  Patient Location: ICU  Anesthesia Type:General  Level of Consciousness: sedated and Patient remains intubated per anesthesia plan  Airway & Oxygen Therapy: Patient remains intubated per anesthesia plan and Patient placed on Ventilator (see vital sign flow sheet for setting)  Post-op Assessment: Report given to RN and Post -op Vital signs reviewed and stable  Post vital signs: Reviewed and stable  Last Vitals:  Vitals Value Taken Time  BP 95/51   Temp    Pulse 80   Resp 16   SpO2 96     Last Pain:  Vitals:   10/10/22 0614  TempSrc:   PainSc: 0-No pain         Complications: There were no known notable events for this encounter.  Patient transported to ICU with standard monitors (HR, BP, SPO2, RR) and emergency drugs/equipment. Controlled ventilation maintained via ambu bag. Report given to bedside RN and respiratory therapist. Pt connected to ICU monitor and ventilator. All questions answered and vital signs stable before leaving

## 2022-10-10 NOTE — Interval H&P Note (Signed)
History and Physical Interval Note:  10/10/2022 8:18 AM  Jon Jackson  has presented today for surgery, with the diagnosis of SEVERE AS CAD.  The various methods of treatment have been discussed with the patient and family. After consideration of risks, benefits and other options for treatment, the patient has consented to  Procedure(s): CORONARY ARTERY BYPASS GRAFTING (CABG) (N/A) AORTIC VALVE REPLACEMENT (AVR) (N/A) TRANSESOPHAGEAL ECHOCARDIOGRAM (N/A) as a surgical intervention.  The patient's history has been reviewed, patient examined, no change in status, stable for surgery.  I have reviewed the patient's chart and labs.  Questions were answered to the patient's satisfaction.     Alleen Borne

## 2022-10-11 ENCOUNTER — Inpatient Hospital Stay (HOSPITAL_COMMUNITY): Payer: Medicare Other

## 2022-10-11 ENCOUNTER — Encounter (HOSPITAL_COMMUNITY): Payer: Self-pay | Admitting: Surgery

## 2022-10-11 LAB — GLUCOSE, CAPILLARY
Glucose-Capillary: 113 mg/dL — ABNORMAL HIGH (ref 70–99)
Glucose-Capillary: 117 mg/dL — ABNORMAL HIGH (ref 70–99)
Glucose-Capillary: 125 mg/dL — ABNORMAL HIGH (ref 70–99)
Glucose-Capillary: 127 mg/dL — ABNORMAL HIGH (ref 70–99)
Glucose-Capillary: 129 mg/dL — ABNORMAL HIGH (ref 70–99)
Glucose-Capillary: 133 mg/dL — ABNORMAL HIGH (ref 70–99)
Glucose-Capillary: 135 mg/dL — ABNORMAL HIGH (ref 70–99)
Glucose-Capillary: 137 mg/dL — ABNORMAL HIGH (ref 70–99)
Glucose-Capillary: 140 mg/dL — ABNORMAL HIGH (ref 70–99)
Glucose-Capillary: 142 mg/dL — ABNORMAL HIGH (ref 70–99)
Glucose-Capillary: 147 mg/dL — ABNORMAL HIGH (ref 70–99)
Glucose-Capillary: 148 mg/dL — ABNORMAL HIGH (ref 70–99)
Glucose-Capillary: 153 mg/dL — ABNORMAL HIGH (ref 70–99)

## 2022-10-11 LAB — BASIC METABOLIC PANEL
Anion gap: 5 (ref 5–15)
BUN: 19 mg/dL (ref 8–23)
CO2: 24 mmol/L (ref 22–32)
Calcium: 7.8 mg/dL — ABNORMAL LOW (ref 8.9–10.3)
Chloride: 109 mmol/L (ref 98–111)
Creatinine, Ser: 0.85 mg/dL (ref 0.61–1.24)
GFR, Estimated: >60 mL/min (ref >60)
Glucose, Bld: 132 mg/dL — ABNORMAL HIGH (ref 70–99)
Potassium: 4.2 mmol/L (ref 3.5–5.1)
Sodium: 138 mmol/L (ref 135–145)

## 2022-10-11 LAB — CBC
HCT: 34.1 % — ABNORMAL LOW (ref 39.0–52.0)
HCT: 32.2 % — ABNORMAL LOW (ref 39.0–52.0)
Hemoglobin: 11.3 g/dL — ABNORMAL LOW (ref 13.0–17.0)
Hemoglobin: 10.7 g/dL — ABNORMAL LOW (ref 13.0–17.0)
MCH: 31.8 pg (ref 26.0–34.0)
MCH: 32.1 pg (ref 26.0–34.0)
MCHC: 33.1 g/dL (ref 30.0–36.0)
MCHC: 33.2 g/dL (ref 30.0–36.0)
MCV: 96.1 fL (ref 80.0–100.0)
MCV: 96.7 fL (ref 80.0–100.0)
Platelets: 117 10*3/uL — ABNORMAL LOW (ref 150–400)
Platelets: 106 10*3/uL — ABNORMAL LOW (ref 150–400)
RBC: 3.55 MIL/uL — ABNORMAL LOW (ref 4.22–5.81)
RBC: 3.33 MIL/uL — ABNORMAL LOW (ref 4.22–5.81)
RDW: 12.2 % (ref 11.5–15.5)
RDW: 12.5 % (ref 11.5–15.5)
WBC: 11.6 10*3/uL — ABNORMAL HIGH (ref 4.0–10.5)
WBC: 11.7 10*3/uL — ABNORMAL HIGH (ref 4.0–10.5)
nRBC: 0 % (ref 0.0–0.2)
nRBC: 0 % (ref 0.0–0.2)

## 2022-10-11 LAB — CREATININE, SERUM
Creatinine, Ser: 1.01 mg/dL (ref 0.61–1.24)
GFR, Estimated: >60 mL/min (ref >60)

## 2022-10-11 LAB — MAGNESIUM: Magnesium: 2.8 mg/dL — ABNORMAL HIGH (ref 1.7–2.4)

## 2022-10-11 MED ORDER — ENOXAPARIN SODIUM 30 MG/0.3ML IJ SOSY
30.0000 mg | PREFILLED_SYRINGE | Freq: Every day | INTRAMUSCULAR | Status: DC
  Administered 2022-10-11 – 2022-10-12 (×2): 30 mg via SUBCUTANEOUS
  Filled 2022-10-11 (×2): qty 0.3

## 2022-10-11 MED ORDER — INSULIN ASPART 100 UNIT/ML IJ SOLN
0.0000 [IU] | INTRAMUSCULAR | Status: DC
  Administered 2022-10-11: 2 [IU] via SUBCUTANEOUS

## 2022-10-11 MED ORDER — GUAIFENESIN ER 600 MG PO TB12
600.0000 mg | ORAL_TABLET | Freq: Two times a day (BID) | ORAL | Status: DC | PRN
  Filled 2022-10-11: qty 1

## 2022-10-11 MED ORDER — ORAL CARE MOUTH RINSE: 15.0000 mL | OROMUCOSAL | Status: DC | PRN

## 2022-10-11 MED ORDER — METOPROLOL TARTRATE 25 MG PO TABS
25.0000 mg | ORAL_TABLET | Freq: Two times a day (BID) | ORAL | Status: DC
  Administered 2022-10-11 – 2022-10-14 (×6): 25 mg via ORAL
  Filled 2022-10-11 (×6): qty 1

## 2022-10-11 MED ORDER — INSULIN ASPART 100 UNIT/ML IJ SOLN
0.0000 [IU] | INTRAMUSCULAR | Status: DC
  Administered 2022-10-11 – 2022-10-12 (×3): 2 [IU] via SUBCUTANEOUS

## 2022-10-11 NOTE — Anesthesia Postprocedure Evaluation (Signed)
Anesthesia Post Note  Patient: Jon Jackson  Procedure(s) Performed: CORONARY ARTERY BYPASS GRAFTING (CABG) X 1 USING ENDOSCOPICALY HARVESTED RIGHT GREATER SAPHENOUS VEIN (Chest) AORTIC VALVE REPLACEMENT USING 25 MM INSPIRIS AORTIC VALVE (Chest) TRANSESOPHAGEAL ECHOCARDIOGRAM     Patient location during evaluation: SICU Anesthesia Type: General Level of consciousness: sedated Pain management: pain level controlled Vital Signs Assessment: post-procedure vital signs reviewed and stable Respiratory status: patient remains intubated per anesthesia plan Cardiovascular status: stable Postop Assessment: no apparent nausea or vomiting Anesthetic complications: no   There were no known notable events for this encounter.  Last Vitals:  Vitals:   10/11/22 0505 10/11/22 0600  BP: 109/68 (!) 94/59  Pulse: 97 89  Resp: 17 10  Temp: 36.8 C 36.8 C  SpO2: 97% 96%    Last Pain:  Vitals:   10/11/22 0512  TempSrc:   PainSc: 4                  Collene Schlichter

## 2022-10-11 NOTE — Plan of Care (Signed)
  Problem: Education: Goal: Understanding of cardiac disease, CV risk reduction, and recovery process will improve Outcome: Progressing Goal: Individualized Educational Video(s) Outcome: Progressing   Problem: Activity: Goal: Ability to tolerate increased activity will improve Outcome: Progressing   Problem: Cardiac: Goal: Ability to achieve and maintain adequate cardiovascular perfusion will improve Outcome: Progressing   Problem: Health Behavior/Discharge Planning: Goal: Ability to safely manage health-related needs after discharge will improve Outcome: Progressing   Problem: Education: Goal: Understanding of CV disease, CV risk reduction, and recovery process will improve Outcome: Progressing Goal: Individualized Educational Video(s) Outcome: Progressing   Problem: Activity: Goal: Ability to return to baseline activity level will improve Outcome: Progressing   Problem: Cardiovascular: Goal: Ability to achieve and maintain adequate cardiovascular perfusion will improve Outcome: Progressing Goal: Vascular access site(s) Level 0-1 will be maintained Outcome: Progressing   Problem: Health Behavior/Discharge Planning: Goal: Ability to safely manage health-related needs after discharge will improve Outcome: Progressing   Problem: Education: Goal: Knowledge of General Education information will improve Description: Including pain rating scale, medication(s)/side effects and non-pharmacologic comfort measures Outcome: Progressing   Problem: Health Behavior/Discharge Planning: Goal: Ability to manage health-related needs will improve Outcome: Progressing   Problem: Clinical Measurements: Goal: Ability to maintain clinical measurements within normal limits will improve Outcome: Progressing Goal: Will remain free from infection Outcome: Progressing Goal: Diagnostic test results will improve Outcome: Progressing Goal: Respiratory complications will improve Outcome:  Progressing Goal: Cardiovascular complication will be avoided Outcome: Progressing   Problem: Activity: Goal: Risk for activity intolerance will decrease Outcome: Progressing   Problem: Nutrition: Goal: Adequate nutrition will be maintained Outcome: Progressing   Problem: Coping: Goal: Level of anxiety will decrease Outcome: Progressing   Problem: Elimination: Goal: Will not experience complications related to bowel motility Outcome: Progressing Goal: Will not experience complications related to urinary retention Outcome: Progressing   Problem: Pain Managment: Goal: General experience of comfort will improve Outcome: Progressing   Problem: Safety: Goal: Ability to remain free from injury will improve Outcome: Progressing   Problem: Skin Integrity: Goal: Risk for impaired skin integrity will decrease Outcome: Progressing   Problem: Education: Goal: Will demonstrate proper wound care and an understanding of methods to prevent future damage Outcome: Progressing Goal: Knowledge of disease or condition will improve Outcome: Progressing Goal: Knowledge of the prescribed therapeutic regimen will improve Outcome: Progressing Goal: Individualized Educational Video(s) Outcome: Progressing   Problem: Activity: Goal: Risk for activity intolerance will decrease Outcome: Progressing   Problem: Cardiac: Goal: Will achieve and/or maintain hemodynamic stability Outcome: Progressing   Problem: Clinical Measurements: Goal: Postoperative complications will be avoided or minimized Outcome: Progressing   Problem: Respiratory: Goal: Respiratory status will improve Outcome: Progressing   Problem: Skin Integrity: Goal: Wound healing without signs and symptoms of infection Outcome: Progressing Goal: Risk for impaired skin integrity will decrease Outcome: Progressing   Problem: Urinary Elimination: Goal: Ability to achieve and maintain adequate renal perfusion and functioning  will improve Outcome: Progressing   

## 2022-10-11 NOTE — Progress Notes (Signed)
      [address]       [place] [address]             [phone]                 1 Day Post-Op Procedure(s) (LRB): CORONARY ARTERY BYPASS GRAFTING (CABG) X 1 USING ENDOSCOPICALY HARVESTED RIGHT GREATER SAPHENOUS VEIN (N/A) AORTIC VALVE REPLACEMENT USING 25 MM INSPIRIS AORTIC VALVE (N/A) TRANSESOPHAGEAL ECHOCARDIOGRAM (N/A)   Events: No events extubated _______________________________________________________________ Vitals: BP 118/78   Pulse 94   Temp 98.8 F (37.1 C)   Resp 13   Ht 5\' 10"  (1.778 m)   Wt 97 kg   SpO2 96%   BMI 30.68 kg/m  Filed Weights   10/10/22 0555 10/11/22 0500  Weight: 93.9 kg 97 kg     - Neuro: alert NAD  - Cardiovascular: sinus  Drips: none.   PAP: (21-35)/(5-23) 34/5 CO:  [4.3 L/min-7.5 L/min] 7.5 L/min CI:  [2 L/min/m2-3.5 L/min/m2] 3.5 L/min/m2  - Pulm: EWOB   ABG    Component Value Date/Time   PHART 7.424 10/10/2022 2203   PCO2ART 29.8 (L) 10/10/2022 2203   PO2ART 119 (H) 10/10/2022 2203   HCO3 19.7 (L) 10/10/2022 2203   TCO2 21 (L) 10/10/2022 2203   ACIDBASEDEF 4.0 (H) 10/10/2022 2203   O2SAT 99 10/10/2022 2203    - Abd: ND - Extremity: warm  .Intake/Output      07/12 0701 07/13 0700 07/13 0701 07/14 0700   P.O. 240    I.V. (mL/kg) 1939.5 (20) 41.7 (0.4)   Blood 870    IV Piggyback 2100.8    Total Intake(mL/kg) 5150.3 (53.1) 41.7 (0.4)   Urine (mL/kg/hr) 2335 (1) 175 (0.6)   Chest Tube 395 60   Total Output 2730 235   Net +2420.3 -193.3           _______________________________________________________________ Labs:    Latest Ref Rng & Units 10/11/2022    1:55 AM 10/10/2022   10:03 PM 10/10/2022    4:39 PM  CBC  WBC 4.0 - 10.5 K/uL 11.6     Hemoglobin 13.0 - 17.0 g/dL 91.4  78.2  95.6   Hematocrit 39.0 - 52.0 % 34.1  35.0  34.0   Platelets 150 - 400 K/uL 117         Latest Ref Rng & Units 10/11/2022    1:55 AM 10/10/2022   10:03 PM 10/10/2022    4:39 PM  CMP  Glucose 70 - 99 mg/dL  213     BUN 8 - 23 mg/dL 19     Creatinine 0.86 - 1.24 mg/dL 5.78     Sodium 469 - 629 mmol/L 138  142  140   Potassium 3.5 - 5.1 mmol/L 4.2  4.0  4.5   Chloride 98 - 111 mmol/L 109     CO2 22 - 32 mmol/L 24     Calcium 8.9 - 10.3 mg/dL 7.8       CXR: Small L effusion  _______________________________________________________________  Assessment and Plan: POD 1 s/p bAVR CABG  Neuro: pain controlled CV: on A/S/BB.  Will remove swan and A line Pulm: IS ambulation Renal: creat stable GI: on diet Heme: stable ID: [email or web address] Endo: SSI Dispo: continue ICU care   Corliss Skains 10/11/2022 9:56 AM

## 2022-10-12 ENCOUNTER — Inpatient Hospital Stay (HOSPITAL_COMMUNITY): Payer: Medicare Other

## 2022-10-12 LAB — CBC
HCT: 31 % — ABNORMAL LOW (ref 39.0–52.0)
Hemoglobin: 10.2 g/dL — ABNORMAL LOW (ref 13.0–17.0)
MCH: 32 pg (ref 26.0–34.0)
MCHC: 32.9 g/dL (ref 30.0–36.0)
MCV: 97.2 fL (ref 80.0–100.0)
Platelets: 118 10*3/uL — ABNORMAL LOW (ref 150–400)
RBC: 3.19 MIL/uL — ABNORMAL LOW (ref 4.22–5.81)
RDW: 12.4 % (ref 11.5–15.5)
WBC: 12.2 10*3/uL — ABNORMAL HIGH (ref 4.0–10.5)
nRBC: 0 % (ref 0.0–0.2)

## 2022-10-12 LAB — BASIC METABOLIC PANEL
Anion gap: 7 (ref 5–15)
BUN: 21 mg/dL (ref 8–23)
CO2: 24 mmol/L (ref 22–32)
Calcium: 8.5 mg/dL — ABNORMAL LOW (ref 8.9–10.3)
Chloride: 102 mmol/L (ref 98–111)
Creatinine, Ser: 0.98 mg/dL (ref 0.61–1.24)
GFR, Estimated: >60 mL/min (ref >60)
Glucose, Bld: 124 mg/dL — ABNORMAL HIGH (ref 70–99)
Potassium: 4 mmol/L (ref 3.5–5.1)
Sodium: 133 mmol/L — ABNORMAL LOW (ref 135–145)

## 2022-10-12 LAB — GLUCOSE, CAPILLARY
Glucose-Capillary: 119 mg/dL — ABNORMAL HIGH (ref 70–99)
Glucose-Capillary: 124 mg/dL — ABNORMAL HIGH (ref 70–99)
Glucose-Capillary: 104 mg/dL — ABNORMAL HIGH (ref 70–99)
Glucose-Capillary: 106 mg/dL — ABNORMAL HIGH (ref 70–99)
Glucose-Capillary: 113 mg/dL — ABNORMAL HIGH (ref 70–99)

## 2022-10-12 MED ORDER — SODIUM CHLORIDE 0.9% FLUSH: 3.0000 mL | INTRAVENOUS | Status: DC | PRN

## 2022-10-12 MED ORDER — SODIUM CHLORIDE 0.9 % IV SOLN: 250.0000 mL | INTRAVENOUS | Status: DC | PRN

## 2022-10-12 MED ORDER — ~~LOC~~ CARDIAC SURGERY, PATIENT & FAMILY EDUCATION: Freq: Once | Status: AC

## 2022-10-12 MED ORDER — FUROSEMIDE 40 MG PO TABS
40.0000 mg | ORAL_TABLET | Freq: Every day | ORAL | Status: DC
  Administered 2022-10-12 – 2022-10-14 (×3): 40 mg via ORAL
  Filled 2022-10-12 (×3): qty 1

## 2022-10-12 MED ORDER — SODIUM CHLORIDE 0.9% FLUSH
3.0000 mL | Freq: Two times a day (BID) | INTRAVENOUS | Status: DC
  Administered 2022-10-12 – 2022-10-13 (×4): 3 mL via INTRAVENOUS

## 2022-10-12 NOTE — Plan of Care (Signed)
  Problem: Education: Goal: Understanding of cardiac disease, CV risk reduction, and recovery process will improve Outcome: Progressing Goal: Individualized Educational Video(s) Outcome: Progressing   Problem: Activity: Goal: Ability to tolerate increased activity will improve Outcome: Progressing   Problem: Cardiac: Goal: Ability to achieve and maintain adequate cardiovascular perfusion will improve Outcome: Progressing   Problem: Health Behavior/Discharge Planning: Goal: Ability to safely manage health-related needs after discharge will improve Outcome: Progressing   Problem: Education: Goal: Understanding of CV disease, CV risk reduction, and recovery process will improve Outcome: Progressing Goal: Individualized Educational Video(s) Outcome: Progressing   Problem: Activity: Goal: Ability to return to baseline activity level will improve Outcome: Progressing   Problem: Cardiovascular: Goal: Ability to achieve and maintain adequate cardiovascular perfusion will improve Outcome: Progressing Goal: Vascular access site(s) Level 0-1 will be maintained Outcome: Progressing   Problem: Health Behavior/Discharge Planning: Goal: Ability to safely manage health-related needs after discharge will improve Outcome: Progressing   Problem: Education: Goal: Knowledge of General Education information will improve Description: Including pain rating scale, medication(s)/side effects and non-pharmacologic comfort measures Outcome: Progressing   Problem: Health Behavior/Discharge Planning: Goal: Ability to manage health-related needs will improve Outcome: Progressing   Problem: Clinical Measurements: Goal: Ability to maintain clinical measurements within normal limits will improve Outcome: Progressing Goal: Will remain free from infection Outcome: Progressing Goal: Diagnostic test results will improve Outcome: Progressing Goal: Respiratory complications will improve Outcome:  Progressing Goal: Cardiovascular complication will be avoided Outcome: Progressing   Problem: Activity: Goal: Risk for activity intolerance will decrease Outcome: Progressing   Problem: Nutrition: Goal: Adequate nutrition will be maintained Outcome: Progressing   Problem: Coping: Goal: Level of anxiety will decrease Outcome: Progressing   Problem: Elimination: Goal: Will not experience complications related to bowel motility Outcome: Progressing Goal: Will not experience complications related to urinary retention Outcome: Progressing   Problem: Pain Managment: Goal: General experience of comfort will improve Outcome: Progressing   Problem: Safety: Goal: Ability to remain free from injury will improve Outcome: Progressing   Problem: Skin Integrity: Goal: Risk for impaired skin integrity will decrease Outcome: Progressing   Problem: Education: Goal: Will demonstrate proper wound care and an understanding of methods to prevent future damage Outcome: Progressing Goal: Knowledge of disease or condition will improve Outcome: Progressing Goal: Knowledge of the prescribed therapeutic regimen will improve Outcome: Progressing Goal: Individualized Educational Video(s) Outcome: Progressing   Problem: Activity: Goal: Risk for activity intolerance will decrease Outcome: Progressing   Problem: Cardiac: Goal: Will achieve and/or maintain hemodynamic stability Outcome: Progressing   Problem: Clinical Measurements: Goal: Postoperative complications will be avoided or minimized Outcome: Progressing   Problem: Respiratory: Goal: Respiratory status will improve Outcome: Progressing   Problem: Skin Integrity: Goal: Wound healing without signs and symptoms of infection Outcome: Progressing Goal: Risk for impaired skin integrity will decrease Outcome: Progressing   Problem: Urinary Elimination: Goal: Ability to achieve and maintain adequate renal perfusion and functioning  will improve Outcome: Progressing   

## 2022-10-12 NOTE — Progress Notes (Signed)
      [address]       [place] [address]             [phone]                 2 Days Post-Op Procedure(s) (LRB): CORONARY ARTERY BYPASS GRAFTING (CABG) X 1 USING ENDOSCOPICALY HARVESTED RIGHT GREATER SAPHENOUS VEIN (N/A) AORTIC VALVE REPLACEMENT USING 25 MM INSPIRIS AORTIC VALVE (N/A) TRANSESOPHAGEAL ECHOCARDIOGRAM (N/A)   Events: No events Doing well Complains of some constipation _______________________________________________________________ Vitals: BP 129/71   Pulse 96   Temp 98.2 F (36.8 C) (Oral)   Resp (!) 22   Ht 5\' 10"  (1.778 m)   Wt 100 kg   SpO2 96%   BMI 31.63 kg/m  Filed Weights   10/10/22 0555 10/11/22 0500 10/12/22 0500  Weight: 93.9 kg 97 kg 100 kg     - Neuro: alert NAD  - Cardiovascular: sinus  Drips: none.   PAP: (26-47)/(5-32) 37/18 CO:  [7.5 L/min] 7.5 L/min CI:  [3.5 L/min/m2] 3.5 L/min/m2  - Pulm: EWOB   ABG    Component Value Date/Time   PHART 7.424 10/10/2022 2203   PCO2ART 29.8 (L) 10/10/2022 2203   PO2ART 119 (H) 10/10/2022 2203   HCO3 19.7 (L) 10/10/2022 2203   TCO2 21 (L) 10/10/2022 2203   ACIDBASEDEF 4.0 (H) 10/10/2022 2203   O2SAT 99 10/10/2022 2203    - Abd: ND - Extremity: warm  .Intake/Output      07/13 0701 07/14 0700 07/14 0701 07/15 0700   P.O. 480    I.V. (mL/kg) 184.5 (1.8)    Blood     IV Piggyback 237    Total Intake(mL/kg) 901.4 (9)    Urine (mL/kg/hr) 1385 (0.6)    Chest Tube 170    Total Output 1555    Net -653.6            _______________________________________________________________ Labs:    Latest Ref Rng & Units 10/12/2022    4:50 AM 10/11/2022    1:54 PM 10/11/2022    1:55 AM  CBC  WBC 4.0 - 10.5 K/uL 12.2  11.7  11.6   Hemoglobin 13.0 - 17.0 g/dL 91.4  78.2  95.6   Hematocrit 39.0 - 52.0 % 31.0  32.2  34.1   Platelets 150 - 400 K/uL 118  106  117       Latest Ref Rng & Units 10/12/2022    4:50 AM 10/11/2022    1:54 PM 10/11/2022    1:55 AM  CMP   Glucose 70 - 99 mg/dL 213   086   BUN 8 - 23 mg/dL 21   19   Creatinine 5.78 - 1.24 mg/dL 4.69  6.29  5.28   Sodium 135 - 145 mmol/L 133   138   Potassium 3.5 - 5.1 mmol/L 4.0   4.2   Chloride 98 - 111 mmol/L 102   109   CO2 22 - 32 mmol/L 24   24   Calcium 8.9 - 10.3 mg/dL 8.5   7.8     CXR: stable  _______________________________________________________________  Assessment and Plan: POD 2 s/p bAVR CABG  Neuro: pain controlled CV: on A/S/BB.   Pulm: IS ambulation Renal: creat stable.  diuresing GI: on diet Heme: stable ID: [email or web address] Endo: SSI Dispo: floor today   Jon Jackson O Jon Jackson 10/12/2022 9:08 AM

## 2022-10-13 ENCOUNTER — Encounter (HOSPITAL_COMMUNITY): Payer: Self-pay | Admitting: Surgery

## 2022-10-13 LAB — ECHO INTRAOPERATIVE TEE
AR max vel: 1.29 cm2
AV Area VTI: 1.35 cm2
AV Area mean vel: 1.36 cm2
AV Mean grad: 40 mmHg
AV Peak grad: 65.9 mmHg
Ao pk vel: 4.06 m/s
Height: 70 in
S' Lateral: 2.9 cm
Weight: 3312 oz

## 2022-10-13 LAB — CBC
HCT: 27.5 % — ABNORMAL LOW (ref 39.0–52.0)
Hemoglobin: 9 g/dL — ABNORMAL LOW (ref 13.0–17.0)
MCH: 31.8 pg (ref 26.0–34.0)
MCHC: 32.7 g/dL (ref 30.0–36.0)
MCV: 97.2 fL (ref 80.0–100.0)
Platelets: 104 10*3/uL — ABNORMAL LOW (ref 150–400)
RBC: 2.83 MIL/uL — ABNORMAL LOW (ref 4.22–5.81)
RDW: 12.2 % (ref 11.5–15.5)
WBC: 6.4 10*3/uL (ref 4.0–10.5)
nRBC: 0 % (ref 0.0–0.2)

## 2022-10-13 LAB — GLUCOSE, CAPILLARY
Glucose-Capillary: 126 mg/dL — ABNORMAL HIGH (ref 70–99)
Glucose-Capillary: 90 mg/dL (ref 70–99)
Glucose-Capillary: 85 mg/dL (ref 70–99)
Glucose-Capillary: 106 mg/dL — ABNORMAL HIGH (ref 70–99)
Glucose-Capillary: 112 mg/dL — ABNORMAL HIGH (ref 70–99)

## 2022-10-13 LAB — BASIC METABOLIC PANEL
Anion gap: 8 (ref 5–15)
BUN: 18 mg/dL (ref 8–23)
CO2: 26 mmol/L (ref 22–32)
Calcium: 8.6 mg/dL — ABNORMAL LOW (ref 8.9–10.3)
Chloride: 102 mmol/L (ref 98–111)
Creatinine, Ser: 1.07 mg/dL (ref 0.61–1.24)
GFR, Estimated: >60 mL/min (ref >60)
Glucose, Bld: 111 mg/dL — ABNORMAL HIGH (ref 70–99)
Potassium: 4.1 mmol/L (ref 3.5–5.1)
Sodium: 136 mmol/L (ref 135–145)

## 2022-10-13 LAB — SURGICAL PATHOLOGY

## 2022-10-13 MED ORDER — FE FUM-VIT C-VIT B12-FA 460-60-0.01-1 MG PO CAPS
1.0000 | ORAL_CAPSULE | Freq: Every day | ORAL | Status: DC
  Administered 2022-10-13 – 2022-10-14 (×2): 1 via ORAL
  Filled 2022-10-13 (×2): qty 1

## 2022-10-13 MED FILL — Thrombin (Recombinant) For Soln 20000 Unit: CUTANEOUS | Qty: 1 | Status: AC

## 2022-10-13 NOTE — Plan of Care (Signed)
  Problem: Education: Goal: Understanding of cardiac disease, CV risk reduction, and recovery process will improve Outcome: Progressing Goal: Individualized Educational Video(s) Outcome: Progressing   Problem: Activity: Goal: Ability to tolerate increased activity will improve Outcome: Progressing   Problem: Cardiac: Goal: Ability to achieve and maintain adequate cardiovascular perfusion will improve Outcome: Progressing   

## 2022-10-13 NOTE — Discharge Summary (Signed)
[address]       [place] [address]             [phone]    Physician Discharge Summary  Patient ID: Jon Jackson MRN: [id number] DOB/AGE: 1951-01-04 72 y.o.  Admit date: 10/10/2022 Discharge date: 10/14/2022  Admission Diagnoses:  Patient Active Problem List   Diagnosis Date Noted   S/P aortic valve replacement with bioprosthetic valve 10/10/2022   NSTEMI (non-ST elevated myocardial infarction) (HCC) 10/02/2022   Acute coronary syndrome (HCC) 10/02/2022   Hyperlipidemia 07/10/2022   Aortic stenosis 07/10/2022   S/P lumbar fusion 11/19/2020   Osteopenia 11/07/2020   Essential hypertension 11/18/2017   Atrial fibrillation (HCC)    Depression 04/08/2013   Allergic rhinitis 12/12/2012   GERD (gastroesophageal reflux disease) 06/21/2012     Discharge Diagnoses:  Patient Active Problem List   Diagnosis Date Noted   S/P aortic valve replacement with bioprosthetic valve 10/10/2022   NSTEMI (non-ST elevated myocardial infarction) (HCC) 10/02/2022   Acute coronary syndrome (HCC) 10/02/2022   Hyperlipidemia 07/10/2022   Aortic stenosis 07/10/2022   S/P lumbar fusion 11/19/2020   Osteopenia 11/07/2020   Essential hypertension 11/18/2017   Atrial fibrillation (HCC)    Depression 04/08/2013   Allergic rhinitis 12/12/2012   GERD (gastroesophageal reflux disease) 06/21/2012     Discharged Condition: good  Referring: [name], MD  Primary Care: Tommie Sams, DO  History of Present Illness:     [name] is a 72 year old gentleman with past history notable for atrial fibrillation status post ablation in 2018 with successful conversion to sinus rhythm.  He has a history of hypertension and dyslipidemia along with anxiety and depression.  He was in his usual state of health last evening when he developed chest pain radiating to his left arm associated with nausea and diaphoresis.  He was brought to the [facility] emergency room.   Initial high-sensitivity troponin was 61 and later rose to 485.  Other workup including a chest x-ray was unremarkable.  EKG showed question of an old inferior myocardial infarction.  He was seen by Dr. Algie Coffer.  He was admitted to the hospital with acute non-ST elevation myocardial infarction and started on a heparin infusion.  He underwent left heart catheterization earlier today demonstrating a 70% proximal right coronary artery stenosis, normal LVEDP, severe aortic stenosis, and a 2+ aortic insufficiency.  Echocardiogram performed today confirms severe aortic stenosis with severe valvular calcification and mild AI. The mean aortic valve gradient today is with a peak gradient of 60.47mmHg. I do not see a calculate aortic valve area on the Echo done today.  Left ventricular ejection fraction by echo is 55-60%. RV function is preserved.   [name] has single-vessel coronary artery disease presenting with NSTEMI and severe aortic stenosis that has progressed significantly in the past 3 years. There is also significant aortic valvular calcification.  He remains fairly active.  He is stable and not having chest pain or shortness of breath at rest. Expect he would gain the most long-term benefit from surgical valve replacement and CABG over TAVR and PCI.   Both options were discussed with him and wife. He would like to have open surgical AVR and CABG. I discussed the operative procedure with the patient and his wife including alternatives, benefits and risks; including but not limited to bleeding, blood transfusion, infection, stroke, myocardial infarction, graft failure, heart block requiring a permanent pacemaker, organ dysfunction,  and death.  Jon Jackson understands and agrees to proceed.    Hospital Course: [name] was discharged home following left heart catheterization.  He was readmitted for elective surgery on 10/10/2022 and taken to the operating room where aortic valve replacement was  carried out utilizing a 25 mm bioprosthetic valve.  Single-vessel coronary bypass grafting was also carried out with saphenous vein grafted to distal RCA.  Following the procedure, he separated from cardiopulmonary bypass without difficulty.  He was transferred to the surgical ICU in stable condition. He was extubated late the evening of surgery. Theone Murdoch, a line, chest tubes and foley were removed early in his post op course. He was transitioned off the Insulin drip. His pre op HGA1C was 5.2.  He was started on Lopressor and this was titrated accordingly. He was volume overloaded and diuresed. He had expected post op blood loss anemia. He did not require a post op transfusion. He also had mild thrombocytopenia. He was felt surgically stable for transfer from the ICU to 4E on 07/14.  Epicardial pacing wires were removed on postop day 3.  Incisions are noted to be healing well without evidence of infection.  He is tolerating diet and routine cardiac rehab modalities.  Oxygen was weaned and he maintains good saturations on room air.  He has remained afebrile.  Overall, at the time of discharge the patient is felt to be quite stable.  Consults: None  Significant Diagnostic Studies:  ECHO INTRAOPERATIVE TEE  Result Date: 10/13/2022  *INTRAOPERATIVE TRANSESOPHAGEAL REPORT *  Patient Name:   Jon Jackson Date of Exam: 10/10/2022 Medical Rec #:  [id number]        Height:       70.0 in Accession #:    [id number]       Weight:       207.0 lb Date of Birth:  [birth year]        BSA:          2.12 m Patient Age:    72 years         BP:           114/54 mmHg Patient Gender: M                HR:           76 bpm. Exam Location:  Anesthesiology Transesophogeal exam was perform intraoperatively during surgical procedure. Patient was closely monitored under general anesthesia during the entirety of examination. Indications:     I35.2 Nonrheumatic aortic (valve) stenosis with insufficiency Performing Phys: [id number] BRYAN K  BARTLE Complications: No known complications during this procedure. POST-OP IMPRESSIONS _ Left Ventricle: has normal systolic function. The cavity size was normal. The wall motion is normal. _ Right Ventricle: The right ventricle appears unchanged from pre-bypass. _ Aorta: The aorta appears unchanged from pre-bypass. _ Left Atrial Appendage: The left atrial appendage appears unchanged from pre-bypass. _ Aortic Valve: A pericardial bioprosthetic valve was placed, leaflets are freely mobile and leaflets thin Manufactured by; [employer] Size; 25mm. No regurgitation post repair. The gradient recorded across the prosthetic valve is within the expected range. No perivalvular leak noted. _ Mitral Valve: There is trivial regurgitation. _ Tricuspid Valve: There is trivial regurgitation. _ Comments: Post-bypass images reviewed with surgeon. PRE-OP FINDINGS  Left Ventricle: The left ventricle has normal systolic function, with an ejection fraction of 55-60%. The cavity size was normal. There is no left ventricular hypertrophy. Right Ventricle: The right ventricle has normal systolic  function. The cavity was normal. There is no increase in right ventricular wall thickness. Left Atrium: Left atrial size was dilated. No left atrial/left atrial appendage thrombus was detected. Right Atrium: Right atrial size was dilated. Interatrial Septum: No atrial level shunt detected by color flow Doppler. Pericardium: There is no evidence of pericardial effusion. Mitral Valve: The mitral valve is normal in structure. Mitral valve regurgitation is trivial by color flow Doppler. Tricuspid Valve: The tricuspid valve was normal in structure. Tricuspid valve regurgitation is trivial by color flow Doppler. Aortic Valve: The aortic valve is tricuspid Aortic valve regurgitation is mild by color flow Doppler. There is severe stenosis of the aortic valve, with a calculated valve area of 1.35 cm. There is moderate thickening and severe calcifcation  present. Pulmonic Valve: The pulmonic valve was normal in structure. Pulmonic valve regurgitation is not visualized by color flow Doppler. Aorta: The aortic root, ascending aorta and aortic arch are normal in size and structure. There is evidence of plaque in the descending aorta, aortic root, ascending aorta and aortic arch. Pulmonary Artery: The pulmonary artery is of normal size. +--------------+--------++ LEFT VENTRICLE         +--------------+--------++ PLAX 2D                +--------------+--------++ LVIDd:        3.90 cm  +--------------+--------++ LVIDs:        2.90 cm  +--------------+--------++ LV PW:        0.50 cm  +--------------+--------++ LVOT diam:    2.40 cm  +--------------+--------++ LV SV:        34 ml    +--------------+--------++ LV SV Index:  15.48    +--------------+--------++ LVOT Area:    4.52 cm +--------------+--------++                        +--------------+--------++ +------------------+------------++ AORTIC VALVE                   +------------------+------------++ AV Area (Vmax):   1.29 cm     +------------------+------------++ AV Area (Vmean):  1.36 cm     +------------------+------------++ AV Area (VTI):    1.35 cm     +------------------+------------++ AV Vmax:          406.00 cm/s  +------------------+------------++ AV Vmean:         302.000 cm/s +------------------+------------++ AV VTI:           0.988 m      +------------------+------------++ AV Peak Grad:     65.9 mmHg    +------------------+------------++ AV Mean Grad:     40.0 mmHg    +------------------+------------++ LVOT Vmax:        116.00 cm/s  +------------------+------------++ LVOT Vmean:       90.600 cm/s  +------------------+------------++ LVOT VTI:         0.295 m      +------------------+------------++ LVOT/AV VTI ratio:0.30         +------------------+------------++  +-------------+-------++ AORTA                 +-------------+-------++ Ao Root diam:3.80 cm +-------------+-------++  +--------------+-------+ SHUNTS                +--------------+-------+ Systemic VTI: 0.30 m  +--------------+-------+ Systemic Diam:2.40 cm +--------------+-------+  Arrie Aran MD Electronically signed by Arrie Aran MD Signature Date/Time: 10/13/2022/7:15:50 AM    Final    DG Chest Port 1 View  Result Date: 10/12/2022 CLINICAL DATA:  72 year old male with  history of pneumothorax. EXAM: PORTABLE CHEST 1 VIEW COMPARISON:  Chest x-ray 10/11/2022. FINDINGS: Right internal jugular Cordis with tip terminating in the proximal superior vena cava. Previously noted Swan-Ganz catheter has been removed. Lung volumes are low. Opacity at the left base which likely reflects resolving postoperative subsegmental atelectasis, with superimposed small left pleural effusion. No definite acute consolidative airspace disease. No right pleural effusion. No pneumothorax. No evidence of pulmonary edema. Heart size is upper limits of normal. The patient is rotated to the left on today's exam, resulting in distortion of the mediastinal contours and reduced diagnostic sensitivity and specificity for mediastinal pathology. Status post median sternotomy for aortic valve replacement with what appears to be a stented bioprosthesis. IMPRESSION: 1. Postoperative changes and support apparatus, as above. 2. Low lung volumes with resolving left basilar subsegmental atelectasis and small left pleural effusion. Electronically Signed   By: Trudie Reed M.D.   On: 10/12/2022 08:48   DG Chest Port 1 View  Result Date: 10/11/2022 CLINICAL DATA:  Status post aortic valve replacement. EXAM: PORTABLE CHEST 1 VIEW COMPARISON:  Chest x-ray from yesterday. FINDINGS: Interval removal of the endotracheal tube. Unchanged right internal jugular Swan-Ganz catheter and mediastinal drains. Stable cardiomediastinal silhouette status post AVR. Lung volumes are lower compared  to prior with increased mild atelectasis bilaterally and unchanged small left pleural effusion. No pneumothorax. No acute osseous abnormality. IMPRESSION: 1. Interval extubation with lower lung volumes and increased mild atelectasis bilaterally. 2. Unchanged small left pleural effusion. Electronically Signed   By: Obie Dredge M.D.   On: 10/11/2022 10:08   DG Chest Port 1 View  Result Date: 10/10/2022 CLINICAL DATA:  Status post aortic valve replacement EXAM: PORTABLE CHEST 1 VIEW COMPARISON:  Chest x-ray 10/09/2022, CT 07/17/2016 FINDINGS: Interval intubation, tip of the endotracheal tube is about 3.4 cm superior to the carina. Interval post sternotomy changes and aortic valve prosthesis. Mediastinal drainage catheters present. Right IJ Swan-Ganz catheter appears looped in the region of pulmonary outflow tract with tip directed inferior and to the left. Small left-sided effusion with airspace disease at left base. Borderline cardiomegaly with aortic atherosclerosis. No pneumothorax IMPRESSION: 1. Interval post sternotomy changes and aortic valve prosthesis. 2. Right IJ Swan-Ganz catheter appears looped in the region of pulmonary outflow tract with tip directed inferior and to the left. 3. Endotracheal tube tip about 3.4 cm superior to carina 4. Small left effusion with left basilar airspace disease, likely atelectasis. Electronically Signed   By: Jasmine Pang M.D.   On: 10/10/2022 15:17   EP STUDY  Result Date: 10/10/2022 See surgical note for result.  DG Chest 2 View  Result Date: 10/09/2022 CLINICAL DATA:  Preop. Aortic valve stenosis, coronary artery disease. EXAM: CHEST - 2 VIEW COMPARISON:  October 02, 2022. FINDINGS: The heart size and mediastinal contours are within normal limits. Both lungs are clear. Mild dextroscoliosis of thoracic spine. IMPRESSION: No active cardiopulmonary disease. Electronically Signed   By: Lupita Raider M.D.   On: 10/09/2022 17:29   VAS US DOPPLER PRE CABG  Result  Date: 10/09/2022 PREOPERATIVE VASCULAR EVALUATION Patient Name:  Jon Jackson  Date of Exam:   10/09/2022 Medical Rec #: [id number]         Accession #:    [id number] Date of Birth: 02-12-1951         Patient Gender: M Patient Age:   72 years Exam Location:  [facility] Procedure:      VAS US DOPPLER PRE CABG Referring  Phys: Evelene Croon --------------------------------------------------------------------------------  Indications:   Pre-CABG and MVR. Risk Factors:  Hypertension, current smoker, coronary artery disease. Other Factors: Dyslipidemia. Performing Technologist: Marilynne Halsted RDMS, RVT  Examination Guidelines: A complete evaluation includes B-mode imaging, spectral Doppler, color Doppler, and power Doppler as needed of all accessible portions of each vessel. Bilateral testing is considered an integral part of a complete examination. Limited examinations for reoccurring indications may be performed as noted.  Right Carotid Findings: +----------+--------+--------+--------+--------+--------+           PSV cm/sEDV cm/sStenosisDescribeComments +----------+--------+--------+--------+--------+--------+ CCA Prox  75      19                               +----------+--------+--------+--------+--------+--------+ CCA Distal88      16                               +----------+--------+--------+--------+--------+--------+ ICA Prox  77      23      1-39%                    +----------+--------+--------+--------+--------+--------+ ICA Distal91      29                               +----------+--------+--------+--------+--------+--------+ ECA       102     11                               +----------+--------+--------+--------+--------+--------+ +----------+--------+-------+----------------+------------+           PSV cm/sEDV cmsDescribe        Arm Pressure +----------+--------+-------+----------------+------------+ Subclavian123            Multiphasic, WNL              +----------+--------+-------+----------------+------------+ +---------+--------+--+--------+-+---------+ VertebralPSV cm/s57EDV cm/s9Antegrade +---------+--------+--+--------+-+---------+ Left Carotid Findings: +----------+--------+--------+--------+--------+--------+           PSV cm/sEDV cm/sStenosisDescribeComments +----------+--------+--------+--------+--------+--------+ CCA Prox  74      20                               +----------+--------+--------+--------+--------+--------+ CCA Distal67      14                               +----------+--------+--------+--------+--------+--------+ ICA Prox  79      24      1-39%                    +----------+--------+--------+--------+--------+--------+ ICA Distal90      21                               +----------+--------+--------+--------+--------+--------+ ECA       93      17                               +----------+--------+--------+--------+--------+--------+ +----------+--------+--------+----------------+------------+ SubclavianPSV cm/sEDV cm/sDescribe        Arm Pressure +----------+--------+--------+----------------+------------+           112  Multiphasic, WNL             +----------+--------+--------+----------------+------------+ +---------+--------+--+--------+--+---------+ VertebralPSV cm/s75EDV cm/s17Antegrade +---------+--------+--+--------+--+---------+  ABI Findings: +--------+------------------+-----+---------+--------+ Right   Rt Pressure (mmHg)IndexWaveform Comment  +--------+------------------+-----+---------+--------+ [id number]                    triphasic         +--------+------------------+-----+---------+--------+ PTA     127               0.91 triphasic         +--------+------------------+-----+---------+--------+ DP      133               0.96 triphasic         +--------+------------------+-----+---------+--------+  +--------+------------------+-----+----------+-------+ Left    Lt Pressure (mmHg)IndexWaveform  Comment +--------+------------------+-----+----------+-------+ [id number]                    triphasic         +--------+------------------+-----+----------+-------+ PTA     83                0.60 monophasic        +--------+------------------+-----+----------+-------+ DP      69                0.50 monophasic        +--------+------------------+-----+----------+-------+ +-------+---------------+----------------+ ABI/TBIToday's ABI/TBIPrevious ABI/TBI +-------+---------------+----------------+ Right  0.96                            +-------+---------------+----------------+ Left   0.60                            +-------+---------------+----------------+  Right Doppler Findings: +-----------+--------+-----+---------+----------+ Site       PressureIndexDoppler  Comments   +-----------+--------+-----+---------+----------+ Brachial   132          triphasic           +-----------+--------+-----+---------+----------+ Radial                  Abnormal            +-----------+--------+-----+---------+----------+ Ulnar                   abnormal            +-----------+--------+-----+---------+----------+ Palmar Arch                      incomplete +-----------+--------+-----+---------+----------+  Left Doppler Findings: +-----------+--------+-----+---------+--------+ Site       PressureIndexDoppler  Comments +-----------+--------+-----+---------+--------+ Brachial   139          triphasic         +-----------+--------+-----+---------+--------+ Radial                  biphasic          +-----------+--------+-----+---------+--------+ Ulnar                   biphasic          +-----------+--------+-----+---------+--------+ Palmar Arch                      WNL      +-----------+--------+-----+---------+--------+   Summary: Right Carotid:  Velocities in the right ICA are consistent with a 1-39% stenosis. Left Carotid: Velocities in the left ICA are consistent with a 1-39% stenosis. Vertebrals:  Bilateral vertebral arteries demonstrate antegrade flow. Subclavians: Normal flow hemodynamics  were seen in bilateral subclavian              arteries. Right ABI: Resting right ankle-brachial index is within normal range. Left ABI: Resting left ankle-brachial index indicates moderate left lower extremity arterial disease. Right Upper Extremity: Doppler waveforms remain within normal limits with right radial compression. Doppler waveform obliterate with right ulnar compression. Left Upper Extremity: Doppler waveforms remain within normal limits with left radial compression. Doppler waveforms remain within normal limits with left ulnar compression.  Electronically signed by Sherald Hess MD on 10/09/2022 at 11:14:07 AM.    Final    DG Orthopantogram  Result Date: 10/03/2022 CLINICAL DATA:  Preop evaluation for aortic surgery EXAM: ORTHOPANTOGRAM/PANORAMIC COMPARISON:  None Available. FINDINGS: Panoramic view of the mandible was obtained and reveals no acute fracture or dislocation. No periapical lucencies are seen. Dental hardware is noted. IMPRESSION: No acute abnormality noted. Electronically Signed   By: Alcide Clever M.D.   On: 10/03/2022 16:46   ECHOCARDIOGRAM COMPLETE  Result Date: 10/03/2022    ECHOCARDIOGRAM REPORT   Patient Name:   Jon Jackson Date of Exam: 10/03/2022 Medical Rec #:  [id number]        Height:       70.5 in Accession #:    [id number]       Weight:       208.8 lb Date of Birth:  Mar 03, 1951        BSA:          2.137 m Patient Age:    72 years         BP:           180/85 mmHg Patient Gender: M                HR:           64 bpm. Exam Location:  Inpatient Procedure: 2D Echo, Cardiac Doppler and Color Doppler Indications:    Acute Myocardial infarction  History:        Patient has prior history of Echocardiogram examinations, most                  recent 07/19/2019. Aortic Valve Disease, Arrythmias:Atrial                 Fibrillation; Risk Factors:Dyslipidemia.  Sonographer:    Raeford Razor Referring Phys: [phone] [facility]  Sonographer Comments: Technically difficult study due to poor echo windows and no parasternal window. Image acquisition challenging due to patient body habitus. IMPRESSIONS  1. Left ventricular ejection fraction, by estimation, is 55 to 60%. The left ventricle has normal function. The left ventricle has no regional wall motion abnormalities. Left ventricular diastolic parameters are consistent with Grade I diastolic dysfunction (impaired relaxation).  2. Right ventricular systolic function is normal. The right ventricular size is normal.  3. Left atrial size was moderately dilated.  4. Right atrial size was mildly dilated.  5. The mitral valve is normal in structure. Trivial mitral valve regurgitation.  6. The aortic valve is calcified. There is severe calcifcation of the aortic valve. There is moderate thickening of the aortic valve. Aortic valve regurgitation is mild. Severe aortic valve stenosis.  7. There is mild (Grade II) atheroma plaque involving the aortic root and ascending aorta.  8. The inferior vena cava is dilated in size with >50% respiratory variability, suggesting right atrial pressure of 8 mmHg. Conclusion(s)/Recommendation(s): Findings consistent with severe valvular heart disease. FINDINGS  Left Ventricle: Left ventricular ejection fraction,  by estimation, is 55 to 60%. The left ventricle has normal function. The left ventricle has no regional wall motion abnormalities. The left ventricular internal cavity size was normal in size. There is  borderline left ventricular hypertrophy. Left ventricular diastolic parameters are consistent with Grade I diastolic dysfunction (impaired relaxation). Right Ventricle: The right ventricular size is normal. No increase in right ventricular wall thickness. Right  ventricular systolic function is normal. Left Atrium: Left atrial size was moderately dilated. Right Atrium: Right atrial size was mildly dilated. Pericardium: There is no evidence of pericardial effusion. Mitral Valve: The mitral valve is normal in structure. Trivial mitral valve regurgitation. Tricuspid Valve: The tricuspid valve is grossly normal. Tricuspid valve regurgitation is trivial. Aortic Valve: The aortic valve is calcified. There is severe calcifcation of the aortic valve. There is moderate thickening of the aortic valve. There is moderate aortic valve annular calcification. Aortic valve regurgitation is mild. Severe aortic stenosis is present. Aortic valve mean gradient measures 33.0 mmHg. Aortic valve peak gradient measures 60.8 mmHg. Pulmonic Valve: The pulmonic valve was normal in structure. Pulmonic valve regurgitation is not visualized. Aorta: The aortic root is normal in size and structure. There is mild (Grade II) atheroma plaque involving the aortic root and ascending aorta. Venous: The inferior vena cava is dilated in size with greater than 50% respiratory variability, suggesting right atrial pressure of 8 mmHg. IAS/Shunts: The atrial septum is grossly normal.   LV Volumes (MOD) LV vol d, MOD A2C: 132.0 ml Diastology LV vol d, MOD A4C: 192.0 ml LV e' medial:    6.99 cm/s LV vol s, MOD A2C: 62.6 ml  LV E/e' medial:  11.0 LV vol s, MOD A4C: 91.5 ml  LV e' lateral:   7.93 cm/s LV SV MOD A2C:     69.4 ml  LV E/e' lateral: 9.7 LV SV MOD A4C:     192.0 ml LV SV MOD BP:      88.9 ml RIGHT VENTRICLE            IVC RV S prime:     9.17 cm/s  IVC diam: 2.50 cm TAPSE (M-mode): 2.6 cm LEFT ATRIUM             Index        RIGHT ATRIUM           Index LA Vol (A2C):   59.5 ml 27.84 ml/m  RA Area:     13.70 cm LA Vol (A4C):   73.6 ml 34.44 ml/m  RA Volume:   33.50 ml  15.68 ml/m LA Biplane Vol: 68.7 ml 32.15 ml/m  AORTIC VALVE AV Vmax:           390.00 cm/s AV Vmean:          267.000 cm/s AV VTI:             0.882 m AV Peak Grad:      60.8 mmHg AV Mean Grad:      33.0 mmHg LVOT Vmax:         103.00 cm/s LVOT Vmean:        64.900 cm/s LVOT VTI:          0.218 m LVOT/AV VTI ratio: 0.25 MITRAL VALVE MV Area (PHT): 2.99 cm     SHUNTS MV Decel Time: 254 msec     Systemic VTI: 0.22 m MV E velocity: 77.10 cm/s MV A velocity: 104.00 cm/s MV E/A ratio:  0.74 Orpah Cobb MD Electronically signed by  Orpah Cobb MD Signature Date/Time: 10/03/2022/11:35:40 AM    Final    CARDIAC CATHETERIZATION  Result Date: 10/03/2022   Prox LAD to Mid LAD lesion is 30% stenosed.   Prox RCA lesion is 70% stenosed.   LV end diastolic pressure is normal.   Hemodynamic findings consistent with aortic valve stenosis.   There is severe aortic valve stenosis. There is mild (2+) aortic regurgitation.   Recommend dual antiplatelet therapy with Aspirin 81mg  daily and Clopidogrel 75mg  daily. CVTS/TVAR consult + RCA angioplasty to follow AV replacement.   DG Chest 2 View  Result Date: 10/02/2022 CLINICAL DATA:  Chest pain and pressure in the center of chest EXAM: CHEST - 2 VIEW COMPARISON:  Radiographs 07/15/2021 FINDINGS: Stable cardiomediastinal silhouette. Aortic atherosclerotic calcification. Left basilar scarring. No focal consolidation, pleural effusion, or pneumothorax. No displaced rib fractures. Remote left rib fractures. IMPRESSION: No active cardiopulmonary disease. Electronically Signed   By: Minerva Fester M.D.   On: 10/02/2022 19:05      Results for orders placed or performed during the hospital encounter of 10/10/22 (from the past 48 hour(s))  Glucose, capillary     Status: Abnormal   Collection Time: 10/12/22  4:32 PM  Result Value Ref Range   Glucose-Capillary 106 (H) 70 - 99 mg/dL    Comment: Glucose reference range applies only to samples taken after fasting for at least 8 hours.  Glucose, capillary     Status: Abnormal   Collection Time: 10/12/22  9:02 PM  Result Value Ref Range   Glucose-Capillary 113 (H) 70 - 99  mg/dL    Comment: Glucose reference range applies only to samples taken after fasting for at least 8 hours.   Comment 1 Notify RN    Comment 2 Document in Chart   CBC     Status: Abnormal   Collection Time: 10/13/22  1:50 AM  Result Value Ref Range   WBC 6.4 4.0 - 10.5 K/uL   RBC 2.83 (L) 4.22 - 5.81 MIL/uL   Hemoglobin 9.0 (L) 13.0 - 17.0 g/dL   HCT 19.1 (L) 47.8 - 29.5 %   MCV 97.2 80.0 - 100.0 fL   MCH 31.8 26.0 - 34.0 pg   MCHC 32.7 30.0 - 36.0 g/dL   RDW 62.1 30.8 - 65.7 %   Platelets 104 (L) 150 - 400 K/uL   nRBC 0.0 0.0 - 0.2 %    Comment: Performed at [facility], [address] [place], [place] [address]  Basic metabolic panel     Status: Abnormal   Collection Time: 10/13/22  1:50 AM  Result Value Ref Range   Sodium 136 135 - 145 mmol/L   Potassium 4.1 3.5 - 5.1 mmol/L   Chloride 102 98 - 111 mmol/L   CO2 26 22 - 32 mmol/L   Glucose, Bld 111 (H) 70 - 99 mg/dL    Comment: Glucose reference range applies only to samples taken after fasting for at least 8 hours.   BUN 18 8 - 23 mg/dL   Creatinine, Ser 2.95 0.61 - 1.24 mg/dL   Calcium 8.6 (L) 8.9 - 10.3 mg/dL   GFR, Estimated >28 >41 mL/min    Comment: (NOTE) Calculated using the CKD-EPI Creatinine Equation (2021)    Anion gap 8 5 - 15    Comment: Performed at [facility], [address], [place], [place] [address]  Glucose, capillary     Status: None   Collection Time: 10/13/22  5:49 AM  Result Value Ref Range   Glucose-Capillary 90 70 - 99 mg/dL    Comment: Glucose reference range applies only to samples taken after fasting for at least 8 hours.   Comment 1 Notify RN    Comment 2 Document in Chart   Glucose, capillary     Status: None   Collection Time: 10/13/22 11:38 AM  Result Value Ref Range   Glucose-Capillary 85 70 - 99 mg/dL    Comment: Glucose reference range applies only to samples taken after fasting for at least 8 hours.   Comment 1 Notify RN    Comment 2 Document in Chart   Glucose,  capillary     Status: Abnormal   Collection Time: 10/13/22  4:53 PM  Result Value Ref Range   Glucose-Capillary 106 (H) 70 - 99 mg/dL    Comment: Glucose reference range applies only to samples taken after fasting for at least 8 hours.   Comment 1 Notify RN    Comment 2 Document in Chart   Glucose, capillary     Status: Abnormal   Collection Time: 10/13/22  9:44 PM  Result Value Ref Range   Glucose-Capillary 112 (H) 70 - 99 mg/dL    Comment: Glucose reference range applies only to samples taken after fasting for at least 8 hours.  CBC     Status: Abnormal   Collection Time: 10/14/22  3:44 AM  Result Value Ref Range   WBC 5.6 4.0 - 10.5 K/uL   RBC 2.64 (L) 4.22 - 5.81 MIL/uL   Hemoglobin 8.7 (L) 13.0 - 17.0 g/dL   HCT 16.1 (L) 09.6 - 04.5 %   MCV 97.0 80.0 - 100.0 fL   MCH 33.0 26.0 - 34.0 pg   MCHC 34.0 30.0 - 36.0 g/dL   RDW 40.9 81.1 - 91.4 %   Platelets 129 (L) 150 - 400 K/uL   nRBC 0.0 0.0 - 0.2 %    Comment: Performed at [facility], [address], [place], [place] [address]  Glucose, capillary     Status: Abnormal   Collection Time: 10/14/22  5:47 AM  Result Value Ref Range   Glucose-Capillary 106 (H) 70 - 99 mg/dL    Comment: Glucose reference range applies only to samples taken after fasting for at least 8 hours.      Treatments: surgery:   CARDIOVASCULAR SURGERY OPERATIVE NOTE   10/10/2022   Surgeon:  Alleen Borne, MD   First Assistant: Jillyn Hidden,  PA-C:   An experienced assistant was required given the complexity of this surgery and the standard of surgical care. The assistant was needed for endoscopic vein harvest, exposure, dissection, suctioning, retraction of delicate tissues and sutures, instrument exchange and for overall help during this procedure.     Preoperative Diagnosis:  Severe single-vessel coronary artery disease and severe aortic stenosis.     Postoperative Diagnosis:  Same     Procedure:   Median  Sternotomy Extracorporeal circulation 3.   Coronary artery bypass grafting x 1               SVG to RCA   4.   Endoscopic vein harvest from the right leg 5.   Aortic valve replacement using a 25 mm Edwards INSPIRIS RESILIA pericardial valve     Anesthesia:  General Endotracheal Discharge Exam: Blood pressure (!) 155/82, pulse 73, temperature 98.1 F (36.7 C), temperature source Oral, resp. rate 18, height 5\' 10"  (1.778 m), weight 95.6 kg, SpO2 96%.  General appearance: alert, cooperative, and no distress Heart: regular rate and rhythm Lungs: min dim in bases Abdomen: benign Extremities: minimal edema LE's Wound: incis healing well  Discharge Medications:  The patient has been discharged on:   1.Beta Blocker:  Yes [  y ]                              No   [   ]                              If No, reason:  2.Ace Inhibitor/ARB: Yes [  y ]                                     No  [    ]                                     If No, reason:  3.Statin:   Yes [  y ]                  No  [   ]                  If No, reason:  4.Ecasa:  Yes  [  y ]                  No   [   ]                  If No, reason:  Patient had ACS upon admission:  Plavix/P2Y12 inhibitor: Yes [   ]                                      No  [ n  ]     Discharge Instructions     Amb Referral to Cardiac Rehabilitation   Complete by: As directed    Diagnosis:  CABG Valve Replacement     Valve: Aortic   CABG X ___: 1   After initial evaluation and assessments completed: Virtual Based Care may be provided alone or in conjunction with Phase 2 Cardiac Rehab based on patient barriers.: Yes   Intensive Cardiac Rehabilitation (ICR) [facility] location only OR Traditional Cardiac Rehabilitation (TCR) *If criteria for ICR are not met will enroll in TCR [facility] only): Yes   Discharge patient   Complete by: As directed    Discharge disposition: 01-Home or Self Care   Discharge patient date: 10/14/2022       Allergies as of 10/14/2022       Reactions   Ultram [tramadol] Other (See Comments)   Head felt funny        Medication List     STOP taking these medications    levocetirizine 5 MG tablet Commonly known as: XYZAL   Melatonin 10 MG Tabs       TAKE these medications    aspirin EC 325 MG tablet Take 1 tablet (325 mg total) by mouth daily.   atorvastatin 40 MG tablet Commonly known as: LIPITOR Take 1 tablet (40 mg total) by mouth every evening.  citalopram 20 MG tablet Commonly known as: CELEXA Take 1 tablet (20 mg total) by mouth daily.   Fe Fum-Vit C-Vit B12-FA Caps capsule Commonly known as: TRIGELS-F FORTE Take 1 capsule by mouth daily after breakfast.   furosemide 40 MG tablet Commonly known as: LASIX Take 1 tablet (40 mg total) by mouth daily.   metoprolol tartrate 25 MG tablet Commonly known as: LOPRESSOR Take 0.5 tablets (12.5 mg total) by mouth 2 (two) times daily.   multivitamin tablet Take 1 tablet by mouth daily.   omeprazole 40 MG capsule Commonly known as: PRILOSEC Take 1 capsule (40 mg total) by mouth daily.   oxyCODONE 5 MG immediate release tablet Commonly known as: Oxy IR/ROXICODONE Take 1 tablet (5 mg total) by mouth every 6 (six) hours as needed for up to 7 days for severe pain.        Follow-up Information     [facility] Follow up.   Why: [facility]) -TCTS office referral - they will follow up with you post discharge Contact information: [address] [place] [address] [phone]                 Signed:  Rowe Clack, PA-C  10/14/2022, 2:25 PM

## 2022-10-13 NOTE — Progress Notes (Signed)
Mobility Specialist Progress Note:   10/13/22 1500  Mobility  Activity Ambulated with assistance in hallway  Level of Assistance Contact guard assist, steadying assist  Assistive Device None  Distance Ambulated (ft) 240 ft  Activity Response Tolerated well  Mobility Referral Yes  $Mobility charge 1 Mobility  Mobility Specialist Start Time (ACUTE ONLY) 1531  Mobility Specialist Stop Time (ACUTE ONLY) 1549  Mobility Specialist Time Calculation (min) (ACUTE ONLY) 18 min    Pre Mobility: 92 HR During Mobility: 95 HR Post Mobility:  94 HR  Pt received on EOB, agreeable to mobility. Ambulated in hallway w/ no AD, using contact guard. No c/o and asymptomatic throughout. Pt left on EOB with call bell and family present.  Jon Jackson Mobility Specialist Please contact via Special educational needs teacher or Rehab office at [phone]

## 2022-10-13 NOTE — Progress Notes (Signed)
CARDIAC REHAB PHASE I   PRE:  Rate/Rhythm: 87 SR    BP: sitting 137/66    SpO2: 95 RA  MODE:  Ambulation: 600 ft   POST:  Rate/Rhythm: 103 ST    BP: sitting 154/76     SpO2: 96 RA  Pt moved out of bed following sternal precautions and walked hall with contact guard assist. Increased distance, some fatigue. To recliner. Tolerated well. This was his second walk today.  Discussed with pt IS, sternal precautions, exercise, diet, smoking cessation, and CRPII. Pt receptive. Will refer to [facility] CRPII. Gave d/c video to view. Can walk independently or with his wife. [id number]   Ethelda Chick BS, ACSM-CEP 10/13/2022 11:19 AM

## 2022-10-13 NOTE — Progress Notes (Signed)
 Pacing wires removed per order 

## 2022-10-13 NOTE — Progress Notes (Addendum)
[address]       [place] [address]             [phone]     3 Days Post-Op Procedure(s) (LRB): CORONARY ARTERY BYPASS GRAFTING (CABG) X 1 USING ENDOSCOPICALY HARVESTED RIGHT GREATER SAPHENOUS VEIN (N/A) AORTIC VALVE REPLACEMENT USING 25 MM INSPIRIS AORTIC VALVE (N/A) TRANSESOPHAGEAL ECHOCARDIOGRAM (N/A) Subjective: Feels well  Objective: Vital signs in last 24 hours: Temp:  [97.6 F (36.4 C)-98.8 F (37.1 C)] 98.6 F (37 C) (07/15 0346) Pulse Rate:  [71-92] 75 (07/15 0346) Cardiac Rhythm: Normal sinus rhythm (07/14 1950) Resp:  [12-24] 15 (07/15 0346) BP: (105-141)/(54-75) 128/61 (07/15 0346) SpO2:  [90 %-97 %] 90 % (07/15 0346) Weight:  [98.3 kg] 98.3 kg (07/15 0532)  Hemodynamic parameters for last 24 hours:    Intake/Output from previous day: 07/14 0701 - 07/15 0700 In: 480 [P.O.:480] Out: -  Intake/Output this shift: No intake/output data recorded.  General appearance: alert, cooperative, and no distress Heart: regular rate and rhythm and no murmur Lungs: clear Abdomen: benign Extremities: minor LE edema Wound: chest dressing intact, evh site healing well  Lab Results: Recent Labs    10/12/22 0450 10/13/22 0150  WBC 12.2* 6.4  HGB 10.2* 9.0*  HCT 31.0* 27.5*  PLT 118* 104*   BMET:  Recent Labs    10/12/22 0450 10/13/22 0150  NA 133* 136  K 4.0 4.1  CL 102 102  CO2 24 26  GLUCOSE 124* 111*  BUN 21 18  CREATININE 0.98 1.07  CALCIUM 8.5* 8.6*    PT/INR:  Recent Labs    10/10/22 1409  LABPROT 16.9*  INR 1.4*   ABG    Component Value Date/Time   PHART 7.424 10/10/2022 2203   HCO3 19.7 (L) 10/10/2022 2203   TCO2 21 (L) 10/10/2022 2203   ACIDBASEDEF 4.0 (H) 10/10/2022 2203   O2SAT 99 10/10/2022 2203   CBG (last 3)  Recent Labs    10/12/22 1632 10/12/22 2102 10/13/22 0549  GLUCAP 106* 113* 90    Meds Scheduled Meds:  acetaminophen  1,000 mg Oral Q6H   Or   acetaminophen (TYLENOL) oral liquid 160  mg/5 mL  1,000 mg Per Tube Q6H   acetaminophen (TYLENOL) oral liquid 160 mg/5 mL  650 mg Per Tube Once   aspirin  324 mg Oral Once   aspirin EC  325 mg Oral Daily   Or   aspirin  324 mg Per Tube Daily   atorvastatin  40 mg Oral QPM   bisacodyl  10 mg Oral Daily   Or   bisacodyl  10 mg Rectal Daily   Chlorhexidine Gluconate Cloth  6 each Topical Daily   citalopram  20 mg Oral Daily   docusate sodium  200 mg Oral Daily   enoxaparin (LOVENOX) injection  30 mg Subcutaneous QHS   furosemide  40 mg Oral Daily   insulin aspart  0-24 Units Subcutaneous Q4H   metoprolol tartrate  25 mg Oral BID   pantoprazole  40 mg Oral Daily   sodium chloride flush  3 mL Intravenous Q12H   sodium chloride flush  3 mL Intravenous Q12H   Continuous Infusions:  sodium chloride Stopped (10/10/22 1949)   sodium chloride     sodium chloride Stopped (10/11/22 1601)   sodium chloride     albumin human Stopped (10/10/22 1900)   lactated ringers Stopped (10/11/22 1601)   lactated ringers Stopped (10/11/22 0435)  PRN Meds:.sodium chloride, sodium chloride, albumin human, fentaNYL (SUBLIMAZE) injection, guaiFENesin, metoprolol tartrate, midazolam, ondansetron (ZOFRAN) IV, mouth rinse, oxyCODONE, sodium chloride flush, sodium chloride flush  Xrays DG Chest Port 1 View  Result Date: 10/12/2022 CLINICAL DATA:  72 year old male with history of pneumothorax. EXAM: PORTABLE CHEST 1 VIEW COMPARISON:  Chest x-ray 10/11/2022. FINDINGS: Right internal jugular Cordis with tip terminating in the proximal superior vena cava. Previously noted Swan-Ganz catheter has been removed. Lung volumes are low. Opacity at the left base which likely reflects resolving postoperative subsegmental atelectasis, with superimposed small left pleural effusion. No definite acute consolidative airspace disease. No right pleural effusion. No pneumothorax. No evidence of pulmonary edema. Heart size is upper limits of normal. The patient is rotated to  the left on today's exam, resulting in distortion of the mediastinal contours and reduced diagnostic sensitivity and specificity for mediastinal pathology. Status post median sternotomy for aortic valve replacement with what appears to be a stented bioprosthesis. IMPRESSION: 1. Postoperative changes and support apparatus, as above. 2. Low lung volumes with resolving left basilar subsegmental atelectasis and small left pleural effusion. Electronically Signed   By: Trudie Reed M.D.   On: 10/12/2022 08:48    Assessment/Plan: S/P Procedure(s) (LRB): CORONARY ARTERY BYPASS GRAFTING (CABG) X 1 USING ENDOSCOPICALY HARVESTED RIGHT GREATER SAPHENOUS VEIN (N/A) AORTIC VALVE REPLACEMENT USING 25 MM INSPIRIS AORTIC VALVE (N/A) TRANSESOPHAGEAL ECHOCARDIOGRAM (N/A)  POD#3 1 afeb, VSS, s BP 105-140 range, sinus rhythm 2 O2 sats good on RA 3 voiding- not recorded, weight approx 4 kg >preop, cont daily lasix, normal renal fxn 4 BS well controlled, not a diabetic 5 resolved reactive leukocytosis 6 expected ABLA- trending lower, expect still equilibrating, add iron supp, monitor clinically 7 thrombocytopenia, trending lower- cont to monitor, may need to consider stopping lovenox and or asa- continue for now 8 cont pulm hygiene and rehab- likely home in am if no new issues    LOS: 3 days    Rowe Clack PA-C Pager [phone] 10/13/2022   Patient examined, last CXR reviewed Plan DC EPWs today- stop lovenox DC instructions reviewed with patient for DC home tomorrow.  patient examined and medical record reviewed,agree with above note. Lovett Sox 10/13/2022

## 2022-10-13 NOTE — TOC CM/SW Note (Signed)
Transition of Care [facility]) - Inpatient Brief Assessment   Patient Details  Name: Jon Jackson MRN: [id number] Date of Birth: July 21, 1950  Transition of Care [facility]) CM/SW Contact:    Darrold Span, RN Phone Number: 10/13/2022, 2:48 PM   Clinical Narrative: Pt from home s/p CABG/AVR, CM Notified by [facility] liaison -following patient with MD office protocol referral prearranged for [facility] needs.  We will continue to monitor patient advancement through interdisciplinary progression rounds. If new patient transition needs arise, please place a TOC consult.   Transition of Care Asessment: Insurance and Status: Insurance coverage has been reviewed Patient has primary care physician: Yes Home environment has been reviewed: home w/ spouse Prior level of function:: independent Prior/Current Home Services: No current home services Social Determinants of Health Reivew: SDOH reviewed no interventions necessary Readmission risk has been reviewed: Yes Transition of care needs: no transition of care needs at this time

## 2022-10-14 ENCOUNTER — Other Ambulatory Visit: Payer: Self-pay | Admitting: Surgical

## 2022-10-14 LAB — CBC
HCT: 25.6 % — ABNORMAL LOW (ref 39.0–52.0)
Hemoglobin: 8.7 g/dL — ABNORMAL LOW (ref 13.0–17.0)
MCH: 33 pg (ref 26.0–34.0)
MCHC: 34 g/dL (ref 30.0–36.0)
MCV: 97 fL (ref 80.0–100.0)
Platelets: 129 10*3/uL — ABNORMAL LOW (ref 150–400)
RBC: 2.64 MIL/uL — ABNORMAL LOW (ref 4.22–5.81)
RDW: 12.1 % (ref 11.5–15.5)
WBC: 5.6 10*3/uL (ref 4.0–10.5)
nRBC: 0 % (ref 0.0–0.2)

## 2022-10-14 LAB — GLUCOSE, CAPILLARY: Glucose-Capillary: 106 mg/dL — ABNORMAL HIGH (ref 70–99)

## 2022-10-14 MED ORDER — OXYCODONE HCL 5 MG PO TABS: 5.0000 mg | ORAL_TABLET | Freq: Four times a day (QID) | ORAL | 0 refills | Status: AC | PRN

## 2022-10-14 MED ORDER — FE FUM-VIT C-VIT B12-FA 460-60-0.01-1 MG PO CAPS: 1.0000 | ORAL_CAPSULE | Freq: Every day | ORAL | 2 refills | Status: DC

## 2022-10-14 MED ORDER — ASPIRIN 325 MG PO TBEC: 325.0000 mg | DELAYED_RELEASE_TABLET | Freq: Every day | ORAL | Status: DC

## 2022-10-14 MED ORDER — FUROSEMIDE 40 MG PO TABS: 40.0000 mg | ORAL_TABLET | Freq: Every day | ORAL | 0 refills | Status: DC

## 2022-10-14 MED ORDER — LOSARTAN POTASSIUM 25 MG PO TABS: 25.0000 mg | ORAL_TABLET | Freq: Every day | ORAL | 1 refills | Status: DC

## 2022-10-14 MED FILL — Lidocaine HCl Local Preservative Free (PF) Inj 2%: INTRAMUSCULAR | Qty: 14 | Status: AC

## 2022-10-14 MED FILL — Heparin Sodium (Porcine) Inj 1000 Unit/ML: Qty: 1000 | Status: AC

## 2022-10-14 MED FILL — Potassium Chloride Inj 2 mEq/ML: INTRAVENOUS | Qty: 40 | Status: AC

## 2022-10-14 NOTE — Progress Notes (Addendum)
[address]       [place] [address]             [phone]     4 Days Post-Op Procedure(s) (LRB): CORONARY ARTERY BYPASS GRAFTING (CABG) X 1 USING ENDOSCOPICALY HARVESTED RIGHT GREATER SAPHENOUS VEIN (N/A) AORTIC VALVE REPLACEMENT USING 25 MM INSPIRIS AORTIC VALVE (N/A) TRANSESOPHAGEAL ECHOCARDIOGRAM (N/A) Subjective: Feels well  Objective: Vital signs in last 24 hours: Temp:  [97.7 F (36.5 C)-98.6 F (37 C)] 98.6 F (37 C) (07/16 0401) Pulse Rate:  [67-89] 67 (07/16 0401) Cardiac Rhythm: Sinus bradycardia;Bundle branch block (07/16 0235) Resp:  [16-21] 19 (07/15 2145) BP: (132-162)/(65-82) 132/82 (07/16 0401) SpO2:  [95 %-98 %] 97 % (07/16 0401) Weight:  [95.6 kg] 95.6 kg (07/16 0603)  Hemodynamic parameters for last 24 hours:    Intake/Output from previous day: 07/15 0701 - 07/16 0700 In: 480 [P.O.:480] Out: 1050 [Urine:1050] Intake/Output this shift: No intake/output data recorded.  General appearance: alert, cooperative, and no distress Heart: regular rate and rhythm Lungs: min dim in bases Abdomen: benign Extremities: minimal edema LE's Wound: incis healing well  Lab Results: Recent Labs    10/13/22 0150 10/14/22 0344  WBC 6.4 5.6  HGB 9.0* 8.7*  HCT 27.5* 25.6*  PLT 104* 129*   BMET:  Recent Labs    10/12/22 0450 10/13/22 0150  NA 133* 136  K 4.0 4.1  CL 102 102  CO2 24 26  GLUCOSE 124* 111*  BUN 21 18  CREATININE 0.98 1.07  CALCIUM 8.5* 8.6*    PT/INR: No results for input(s): "LABPROT", "INR" in the last 72 hours. ABG    Component Value Date/Time   PHART 7.424 10/10/2022 2203   HCO3 19.7 (L) 10/10/2022 2203   TCO2 21 (L) 10/10/2022 2203   ACIDBASEDEF 4.0 (H) 10/10/2022 2203   O2SAT 99 10/10/2022 2203   CBG (last 3)  Recent Labs    10/13/22 1653 10/13/22 2144 10/14/22 0547  GLUCAP 106* 112* 106*    Meds Scheduled Meds:  acetaminophen  1,000 mg Oral Q6H   Or   acetaminophen (TYLENOL) oral  liquid 160 mg/5 mL  1,000 mg Per Tube Q6H   acetaminophen (TYLENOL) oral liquid 160 mg/5 mL  650 mg Per Tube Once   aspirin  324 mg Oral Once   aspirin EC  325 mg Oral Daily   Or   aspirin  324 mg Per Tube Daily   atorvastatin  40 mg Oral QPM   bisacodyl  10 mg Oral Daily   Or   bisacodyl  10 mg Rectal Daily   citalopram  20 mg Oral Daily   docusate sodium  200 mg Oral Daily   Fe Fum-Vit C-Vit B12-FA  1 capsule Oral QPC breakfast   furosemide  40 mg Oral Daily   insulin aspart  0-24 Units Subcutaneous Q4H   metoprolol tartrate  25 mg Oral BID   pantoprazole  40 mg Oral Daily   sodium chloride flush  3 mL Intravenous Q12H   sodium chloride flush  3 mL Intravenous Q12H   Continuous Infusions:  sodium chloride Stopped (10/10/22 1949)   sodium chloride     sodium chloride Stopped (10/11/22 1601)   sodium chloride     albumin human Stopped (10/10/22 1900)   lactated ringers Stopped (10/11/22 1601)   lactated ringers Stopped (10/11/22 0435)   PRN Meds:.sodium chloride, sodium chloride, albumin human, fentaNYL (SUBLIMAZE) injection, guaiFENesin, metoprolol tartrate, midazolam, ondansetron (ZOFRAN)  IV, mouth rinse, oxyCODONE, sodium chloride flush, sodium chloride flush  Xrays No results found.  Assessment/Plan: S/P Procedure(s) (LRB): CORONARY ARTERY BYPASS GRAFTING (CABG) X 1 USING ENDOSCOPICALY HARVESTED RIGHT GREATER SAPHENOUS VEIN (N/A) AORTIC VALVE REPLACEMENT USING 25 MM INSPIRIS AORTIC VALVE (N/A) TRANSESOPHAGEAL ECHOCARDIOGRAM (N/A) POD#4  1 afeb,  S BP 130's-160's, sinus rhythm, one short episode of sinus brady- will reduce metoprolol to home dose 2 sats good on RA 3 good UOP, + BM's, good response to diuretic w/ weight trending lower 4 BS well controlled, not a diabetic 5 thrombocytopenia conts to improve, PLT 129 K 6 H/H slightly lower w/slow trend lower over time, not at transfusion threshold, on Iron supp, likely equilibration componenet     LOS: 4 days     Rowe Clack PA-C Pager [phone] 10/14/2022  Doing well and ready for DC Wound and activity instructions reviewed with patient  patient examined and medical record reviewed,agree with above note. Lovett Sox 10/14/2022

## 2022-10-14 NOTE — Plan of Care (Signed)
  Problem: Education: Goal: Understanding of cardiac disease, CV risk reduction, and recovery process will improve Outcome: Adequate for Discharge Goal: Individualized Educational Video(s) Outcome: Adequate for Discharge   Problem: Activity: Goal: Ability to tolerate increased activity will improve Outcome: Adequate for Discharge   Problem: Cardiac: Goal: Ability to achieve and maintain adequate cardiovascular perfusion will improve Outcome: Adequate for Discharge   Problem: Health Behavior/Discharge Planning: Goal: Ability to safely manage health-related needs after discharge will improve Outcome: Adequate for Discharge   Problem: Education: Goal: Understanding of CV disease, CV risk reduction, and recovery process will improve Outcome: Adequate for Discharge Goal: Individualized Educational Video(s) Outcome: Adequate for Discharge

## 2022-10-14 NOTE — Progress Notes (Signed)
CARDIAC REHAB PHASE I   Pt ready for discharge home. All educational needs met. Referral for CRP2 sent to [facility].   Woodroe Chen, RN BSN 10/14/2022 9:26 AM

## 2022-10-14 NOTE — Progress Notes (Signed)
Cozaar added to patient's home medications due to hypertension over the last few days.  Rx: Cozaar 25 mg p.o. daily  Spoke with patient over the phone.  Knows to follow-up with primary care as well.  Recommended home blood pressure monitoring and he understands.  Rowe Clack, PA-C

## 2022-10-14 NOTE — Care Management Important Message (Signed)
Important Message  Patient Details  Name: Jon Jackson MRN: [id number] Date of Birth: 27-Sep-1950   Medicare Important Message Given:  Yes     Renie Ora 10/14/2022, 12:42 PM

## 2022-10-14 NOTE — TOC Transition Note (Signed)
Transition of Care (TOC) - CM/SW Discharge Note Donn Pierini RN, BSN Transitions of Care Unit 4E- RN Case Manager See Treatment Team for direct phone #   Patient Details  Name: Jon Jackson MRN: [id number] Date of Birth: 04-11-1950  Transition of Care [facility]) CM/SW Contact:  Darrold Span, RN Phone Number: 10/14/2022, 10:11 AM   Clinical Narrative:    Pt stable for transition home today, Pt under [facility] protocol w/ TCTS office. [facility] liaison notified of discharge and will follow up with pt for start of care scheduling.   No further TOC needs noted. Family to transport home.    Final next level of care: Home w Home Health Services Barriers to Discharge: Barriers Resolved   Patient Goals and CMS Choice   Choice offered to / list presented to : Patient  Discharge Placement                 Home w/ [facility]        Discharge Plan and Services Additional resources added to the After Visit Summary for     Discharge Planning Services: CM Consult Post Acute Care Choice: Home Health          DME Arranged: N/A DME Agency: NA         HH Agency: [facility] Date [facility] Agency Contacted: 10/14/22   Representative spoke with at [facility] Agency: Amy  Social Determinants of Health (SDOH) Interventions SDOH Screenings   Food Insecurity: No Food Insecurity (10/13/2022)  Housing: Low Risk  (10/13/2022)  Transportation Needs: No Transportation Needs (10/13/2022)  Utilities: Not At Risk (10/13/2022)  Alcohol Screen: Low Risk  (09/11/2020)  Depression (PHQ2-9): Low Risk  (07/10/2022)  Financial Resource Strain: Low Risk  (05/09/2022)  Physical Activity: Sufficiently Active (05/09/2022)  Social Connections: Moderately Isolated (10/02/2022)  Stress: No Stress Concern Present (05/09/2022)  Tobacco Use: High Risk (10/13/2022)     Readmission Risk Interventions    10/14/2022   10:11 AM  Readmission Risk Prevention Plan  Transportation Screening Complete  PCP or Specialist Appt  within 5-7 Days Complete  Home Care Screening Complete  Medication Review (RN CM) Complete

## 2022-10-16 ENCOUNTER — Telehealth: Payer: Self-pay

## 2022-10-16 NOTE — Transitions of Care (Post Inpatient/ED Visit) (Signed)
10/16/2022  Name: Jon Jackson MRN: [id number] DOB: 07-Jan-1951  Today's TOC FU Call Status: Today's TOC FU Call Status:: Successful TOC FU Call Competed TOC FU Call Complete Date: 10/16/22  Transition Care Management Follow-up Telephone Call Date of Discharge: 10/14/22 Discharge Facility: [facility]) Type of Discharge: Inpatient Admission Primary Inpatient Discharge Diagnosis:: Corornary Artery Bypass Graft How have you been since you were released from the hospital?: Better Any questions or concerns?: No  Items Reviewed: Did you receive and understand the discharge instructions provided?: Yes Medications obtained,verified, and reconciled?: Yes (Medications Reviewed) Any new allergies since your discharge?: No Dietary orders reviewed?: No People in Home: spouse Name of Support/Comfort Primary Source: Toniann Fail  Medications Reviewed Today: Medications Reviewed Today     Reviewed by Jodelle Gross, RN (Case Manager) on 10/16/22 at 1038  Med List Status: <None>   Medication Order Taking? Sig Documenting Provider Last Dose Status Informant  aspirin EC 325 MG tablet [id number] Yes Take 1 tablet (325 mg total) by mouth daily. Rowe Clack, PA-C Taking Active   atorvastatin (LIPITOR) 40 MG tablet [id number] Yes Take 1 tablet (40 mg total) by mouth every evening. Orpah Cobb, MD Taking Active   citalopram (CELEXA) 20 MG tablet [id number] Yes Take 1 tablet (20 mg total) by mouth daily. Tommie Sams, DO Taking Active Self, Pharmacy Records  Fe Fum-Vit C-Vit B12-FA (TRIGELS-F FORTE) CAPS capsule [id number] Yes Take 1 capsule by mouth daily after breakfast. Rowe Clack, PA-C Taking Active   furosemide (LASIX) 40 MG tablet [id number] Yes Take 1 tablet (40 mg total) by mouth daily. Rowe Clack, PA-C Taking Active   losartan (COZAAR) 25 MG tablet [id number] Yes Take 1 tablet (25 mg total) by mouth daily. Rowe Clack, PA-C Taking Active   metoprolol tartrate (LOPRESSOR) 25 MG tablet  [id number] Yes Take 0.5 tablets (12.5 mg total) by mouth 2 (two) times daily. Orpah Cobb, MD Taking Active   Multiple Vitamin (MULTIVITAMIN) tablet [id number] Yes Take 1 tablet by mouth daily. [provider] Taking Active Self  omeprazole (PRILOSEC) 40 MG capsule [id number] Yes Take 1 capsule (40 mg total) by mouth daily. Tommie Sams, DO Taking Active Self, Pharmacy Records  oxyCODONE (OXY IR/ROXICODONE) 5 MG immediate release tablet [id number] Yes Take 1 tablet (5 mg total) by mouth every 6 (six) hours as needed for up to 7 days for severe pain. Rowe Clack, PA-C Taking Active             Home Care and Equipment/Supplies: Were Home Health Services Ordered?: Yes Name of Home Health Agency:: [phone] Has Agency set up a time to come to your home?: No EMR reviewed for Home Health Orders:  (Patient has not answered phone for them.  He is not sure if he wants [facility] services) Any new equipment or medical supplies ordered?: No  Functional Questionnaire: Do you need assistance with bathing/showering or dressing?: No Do you need assistance with meal preparation?: No Do you need assistance with eating?: No Do you have difficulty maintaining continence: No Do you need assistance with getting out of bed/getting out of a chair/moving?: No Do you have difficulty managing or taking your medications?: No  Follow up appointments reviewed: PCP Follow-up appointment confirmed?: Yes Date of PCP follow-up appointment?: 10/17/22 Follow-up Provider: Dr. Adriana Simas [facility] Follow-up appointment confirmed?: NA Do you need transportation to your follow-up appointment?: No Do you understand care options if your condition(s) worsen?: Yes-patient verbalized understanding  SDOH Interventions Today  Flowsheet Row Most Recent Value  SDOH Interventions   Food Insecurity Interventions Intervention Not Indicated  Housing Interventions Intervention Not Indicated  Transportation Interventions  Intervention Not Indicated     Jodelle Gross, RN, BSN, CCM Care Management Coordinator [facility] Phone: [phone]/Fax: [phone]

## 2022-10-17 ENCOUNTER — Ambulatory Visit (INDEPENDENT_AMBULATORY_CARE_PROVIDER_SITE_OTHER): Payer: Medicare Other | Admitting: Family Medicine

## 2022-10-17 VITALS — BP 139/83 | HR 75 | Temp 97.7°F | Ht 70.0 in

## 2022-10-17 DIAGNOSIS — I214 Non-ST elevation (NSTEMI) myocardial infarction: Secondary | ICD-10-CM

## 2022-10-17 DIAGNOSIS — I10 Essential (primary) hypertension: Secondary | ICD-10-CM

## 2022-10-17 DIAGNOSIS — Z953 Presence of xenogenic heart valve: Secondary | ICD-10-CM | POA: Diagnosis not present

## 2022-10-17 NOTE — Patient Instructions (Signed)
Continue your current medications.  Follow up in 1 month.  Call with concerns.  Take care  Dr. Adriana Simas

## 2022-10-18 NOTE — Progress Notes (Signed)
Subjective:  Patient ID: Jon Jackson, male    DOB: 1950/06/15  Age: 72 y.o. MRN: [id number]  CC: Chief Complaint  Patient presents with   Hypertension    Patient has had recent aortic valve rep. Surgery a week ago, right leg incision is looking a little red , no drainage or any other signs of infection noted    HPI:  72 year old male with atrial fibrillation, hypertension, GERD, depression, hyperlipidemia presents for hospital follow-up.  Patient recently admitted from 7/4 to 7/6.  Presented with chest pain and was found to have an NSTEMI.  Subsequently found to have severe aortic stenosis.  Previously this had been mild and had been followed by cardiology.  Cardiac catheterization revealed severe RCA proximal to mid vessel disease.  It was recommended that he have placement and one-vessel bypass.  Patient with subsequent admission from 7/12 to 7/16.  He underwent single-vessel bypass as well as bioprosthetic aortic valve replacement.  Patient presents today for hospital follow-up.  He states overall he is doing fairly well.  He does note that he still having some soreness and pain.  His incision site on his right lower leg has been mildly erythematous.  No drainage.  Patient reports that he has stopped taking metoprolol.  He states that this was stopped due to bradycardia.  I could not find any records of this in the chart.  He endorses compliance with his other medication.  Blood pressure fairly well-controlled today.  No chest pain.  He does note some soreness.  No shortness of breath.  Patient Active Problem List   Diagnosis Date Noted   S/P aortic valve replacement with bioprosthetic valve 10/10/2022   NSTEMI (non-ST elevated myocardial infarction) (HCC) 10/02/2022   Hyperlipidemia 07/10/2022   S/P lumbar fusion 11/19/2020   Osteopenia 11/07/2020   Essential hypertension 11/18/2017   Atrial fibrillation (HCC)    Depression 04/08/2013   Allergic rhinitis 12/12/2012   GERD  (gastroesophageal reflux disease) 06/21/2012    Social Hx   Social History   Socioeconomic History   Marital status: Married    Spouse name: Not on file   Number of children: Not on file   Years of education: Not on file   Highest education level: Not on file  Occupational History   Not on file  Tobacco Use   Smoking status: Some Days    Current packs/day: 0.25    Average packs/day: 0.3 packs/day for 47.0 years (11.8 ttl pk-yrs)    Types: Cigars, Cigarettes   Smokeless tobacco: Never   Tobacco comments:    Cigars- chews on tips  Vaping Use   Vaping status: Never Used  Substance and Sexual Activity   Alcohol use: Yes    Alcohol/week: 12.0 standard drinks of alcohol    Types: 12 Cans of beer per week    Comment: daily 3-4 in the summer; soemtimes more on weekends.   Drug use: No   Sexual activity: Yes  Other Topics Concern   Not on file  Social History Narrative   Not on file   Social Determinants of Health   Financial Resource Strain: Low Risk  (05/09/2022)   Overall Financial Resource Strain (CARDIA)    Difficulty of Paying Living Expenses: Not hard at all  Food Insecurity: No Food Insecurity (10/16/2022)   Hunger Vital Sign    Worried About Running Out of Food in the Last Year: Never true    Ran Out of Food in the Last Year: Never true  Transportation Needs: No Transportation Needs (10/16/2022)   PRAPARE - Administrator, Civil Service (Medical): No    Lack of Transportation (Non-Medical): No  Physical Activity: Sufficiently Active (05/09/2022)   Exercise Vital Sign    Days of Exercise per Week: 7 days    Minutes of Exercise per Session: 30 min  Stress: No Stress Concern Present (05/09/2022)   [employer] of Occupational Health - Occupational Stress Questionnaire    Feeling of Stress : Not at all  Social Connections: Moderately Isolated (10/02/2022)   Social Connection and Isolation Panel [NHANES]    Frequency of Communication with Friends and Family:  More than three times a week    Frequency of Social Gatherings with Friends and Family: More than three times a week    Attends Religious Services: Never    Database administrator or Organizations: No    Attends Engineer, structural: Never    Marital Status: Married    Review of Systems Per HPI  Objective:  BP 139/83   Pulse 75   Temp 97.7 F (36.5 C)   Ht 5\' 10"  (1.778 m)   SpO2 96%   BMI 30.24 kg/m      10/17/2022    9:56 AM 10/14/2022    7:17 AM 10/14/2022    6:03 AM  BP/Weight  Systolic BP 139 155   Diastolic BP 83 82   Wt. (Lbs)   210.76  BMI   30.24 kg/m2    Physical Exam Vitals and nursing note reviewed.  Constitutional:      General: He is not in acute distress.    Appearance: Normal appearance.  HENT:     Head: Normocephalic and atraumatic.  Eyes:     General:        Right eye: No discharge.        Left eye: No discharge.     Conjunctiva/sclera: Conjunctivae normal.  Cardiovascular:     Rate and Rhythm: Normal rate and regular rhythm.  Pulmonary:     Effort: Pulmonary effort is normal.     Breath sounds: Normal breath sounds. No wheezing, rhonchi or rales.  Skin:    Comments: Mild erythema surrounding the wound on his right lower extremity.  Neurological:     Mental Status: He is alert.  Psychiatric:        Mood and Affect: Mood normal.        Behavior: Behavior normal.     Lab Results  Component Value Date   WBC 5.6 10/14/2022   HGB 8.7 (L) 10/14/2022   HCT 25.6 (L) 10/14/2022   PLT 129 (L) 10/14/2022   GLUCOSE 111 (H) 10/13/2022   CHOL 186 10/03/2022   TRIG 75 10/03/2022   HDL 47 10/03/2022   LDLCALC 124 (H) 10/03/2022   ALT 26 10/09/2022   AST 19 10/09/2022   NA 136 10/13/2022   K 4.1 10/13/2022   CL 102 10/13/2022   CREATININE 1.07 10/13/2022   BUN 18 10/13/2022   CO2 26 10/13/2022   TSH 1.520 02/24/2017   PSA 0.55 04/07/2014   INR 1.4 (H) 10/10/2022   HGBA1C 5.2 10/09/2022     Assessment & Plan:   Problem List  Items Addressed This Visit       Cardiovascular and Mediastinum   Essential hypertension    Stable.  Continue Lasix and losartan.      NSTEMI (non-ST elevated myocardial infarction) [facility]) - Primary    Doing well status post  one-vessel CABG and aortic valve replacement.  Continue current medications.        Other   S/P aortic valve replacement with bioprosthetic valve    Overall doing well at this time.  Has follow-up with cardiothoracic surgery.  Continue current medications.  Metoprolol has been removed from his medication list.  He states that it was discontinued due to bradycardia.  Defer to cardiology and cardiothoracic surgery regarding restarting.       Follow-up:  Return in about 1 month (around 11/17/2022).  Everlene Other DO [facility]

## 2022-10-18 NOTE — Assessment & Plan Note (Signed)
Doing well status post one-vessel CABG and aortic valve replacement.  Continue current medications.

## 2022-10-18 NOTE — Assessment & Plan Note (Signed)
Overall doing well at this time.  Has follow-up with cardiothoracic surgery.  Continue current medications.  Metoprolol has been removed from his medication list.  He states that it was discontinued due to bradycardia.  Defer to cardiology and cardiothoracic surgery regarding restarting.

## 2022-10-18 NOTE — Assessment & Plan Note (Signed)
Stable.  Continue Lasix and losartan.

## 2022-10-21 MED FILL — Sodium Chloride IV Soln 0.9%: INTRAVENOUS | Qty: 3000 | Status: AC

## 2022-10-21 MED FILL — Heparin Sodium (Porcine) Inj 1000 Unit/ML: INTRAMUSCULAR | Qty: 20 | Status: AC

## 2022-10-21 MED FILL — Sodium Bicarbonate IV Soln 8.4%: INTRAVENOUS | Qty: 50 | Status: AC

## 2022-10-21 MED FILL — Electrolyte-R (PH 7.4) Solution: INTRAVENOUS | Qty: 3000 | Status: AC

## 2022-10-31 ENCOUNTER — Encounter: Payer: Self-pay | Admitting: *Deleted

## 2022-11-02 NOTE — Progress Notes (Signed)
Office Visit    Patient Name: Jon Jackson Date of Encounter: 11/03/2022  Primary Care Provider:  Tommie Sams, DO Primary Cardiologist:  None Primary Electrophysiologist: Will Jorja Loa, MD   Past Medical History    Past Medical History:  Diagnosis Date   Anemia    as a child   Anxiety    Arthritis    Atrial fibrillation (HCC)    Basal cell carcinoma    head   Coronary artery disease    Depression    Dysrhythmia    GERD (gastroesophageal reflux disease)    Heart murmur    Hyperlipidemia    Myocardial infarction (HCC) 10/02/2022   Pneumonia 02/2022   Squamous carcinoma    Past Surgical History:  Procedure Laterality Date   AORTIC VALVE REPLACEMENT N/A 10/10/2022   Procedure: AORTIC VALVE REPLACEMENT USING 25 MM INSPIRIS AORTIC VALVE;  Surgeon: Alleen Borne, MD;  Location: [facility];  Service: Open Heart Surgery;  Laterality: N/A;   ATRIAL FIBRILLATION ABLATION  07/22/2016   ATRIAL FIBRILLATION ABLATION N/A 07/22/2016   Procedure: Atrial Fibrillation Ablation;  Surgeon: Will Jorja Loa, MD;  Location: [facility];  Service: Cardiovascular;  Laterality: N/A;   BASAL CELL CARCINOMA EXCISION     forehead; top of scalp   BIOPSY  05/27/2017   Procedure: BIOPSY;  Surgeon: Corbin Ade, MD;  Location: [facility];  Service: Endoscopy;;  right colon, left colon;   CARDIOVERSION N/A 04/07/2016   Procedure: CARDIOVERSION;  Surgeon: Jonelle Sidle, MD;  Location: [facility];  Service: Cardiovascular;  Laterality: N/A;   COLECTOMY  1990s   removal of section due to strangulated bowel.   COLONOSCOPY N/A 05/30/2013   Procedure: COLONOSCOPY;  Surgeon: Corbin Ade, MD;  Location: [facility];  Service: Endoscopy;  Laterality: N/A;  8:45 AM   COLONOSCOPY     COLONOSCOPY N/A 05/27/2017   Procedure: COLONOSCOPY;  Surgeon: Corbin Ade, MD;  Location: [facility];  Service: Endoscopy;  Laterality: N/A;  1:00pm   CORONARY ARTERY BYPASS GRAFT N/A 10/10/2022    Procedure: CORONARY ARTERY BYPASS GRAFTING (CABG) X 1 USING ENDOSCOPICALY HARVESTED RIGHT GREATER SAPHENOUS VEIN;  Surgeon: Alleen Borne, MD;  Location: [facility];  Service: Open Heart Surgery;  Laterality: N/A;   INGUINAL HERNIA REPAIR Right 1990s   LUMBAR LAMINECTOMY/DECOMPRESSION MICRODISCECTOMY N/A 12/29/2018   Procedure: Decompressive lumbar laminectomy at L3-L4;  Surgeon: Ranee Gosselin, MD;  Location: [facility];  Service: Orthopedics;  Laterality: N/A;    RIGHT HEART CATH AND CORONARY ANGIOGRAPHY N/A 10/03/2022   Procedure: RIGHT HEART CATH AND CORONARY ANGIOGRAPHY;  Surgeon: Orpah Cobb, MD;  [facility];  Service: Cardiovascular;  Laterality: N/A;   RIGHT/LEFT HEART CATH AND CORONARY ANGIOGRAPHY N/A 10/03/2022   Procedure: RIGHT/LEFT HEART CATH AND CORONARY ANGIOGRAPHY;  Surgeon: Orpah Cobb, MD;  [facility];  Service: Cardiovascular;  Laterality: N/A;   SHOULDER ARTHROSCOPY WITH ROTATOR CUFF REPAIR AND SUBACROMIAL DECOMPRESSION Left 05/15/2016   Procedure: LEFT SHOULDER ARTHROSCOPY WITH ROTATOR CUFF REPAIR AND SUBACROMIAL DECOMPRESSION AND DISTAL CLAVICLE RESECTION;  Surgeon: Francena Hanly, MD;  Location: [facility];  Service: Orthopedics;  Laterality: Left;  requests 2hrs   SHOULDER SURGERY Right    SQUAMOUS CELL CARCINOMA EXCISION     scalp   TEE WITHOUT CARDIOVERSION N/A 10/10/2022   Procedure: TRANSESOPHAGEAL ECHOCARDIOGRAM;  Surgeon: Alleen Borne, MD;  Location: [facility];  Service: Open Heart Surgery;  Laterality: N/A;   TONSILLECTOMY      Allergies  Allergies  Allergen Reactions   Ultram [Tramadol] Other (See Comments)    Head felt funny     History of Present Illness    Jon Jackson  is a 72 year old male with a PMH of CAD s/p NSTEMI CABG x 1 AVR with Inspiris Resilia pericardial valve, AF s/p PVI 2018, HLD, GERD, anxiety, HTN who presents today for post hospital follow-up.  Jon Jackson was seen initially in 2017 by Dr. Diona Browner for  management of AF.  He underwent DCCV 04/19/2016 with conversion to sinus rhythm but converted back to AF 5 days later.  He was referred to Dr. Elberta Fortis and underwent successful AF ablation on 07/22/2016. Patient had AC discontinued due to no recurrence of AF.  He continued to do well until 10/02/2022 when he presented to the ED with complaint of radiating chest pain with sweating.Hs troponins were elevated and patient found to have severe aortic stenosis with NSR by EKG.  He underwent R/LHC that showed moderate pLAD and M LAD disease with 30% lesion and significant pRCA lesion of 70%.  2D echo was completed showing EF of 55 to 60% with severe aortic stenosis with grade II atheroma plaque involving the aortic root and ascending aorta. Mean gradient at .  He was evaluated by Dr. Laneta Simmers and plan was made to undergo open AVR and CABG.  He was readmitted 10/10/2022 and underwent successful CABG x 1 with AVR repair.  He developed bradycardia and metoprolol was discontinued.  Hypertension and Cozaar was added to discharge BP medications with discharge 10/14/2022.  He will be seen by thoracic surgery on 11/10/2022.  Since last being seen in the office patient reports he has been doing well with no new cardiac complaints since his valve replacement procedure.  His blood pressure today is stable at 130/62 and heart rate is 78 bpm.  He has been staying active and walking three quarters of a mile daily.  He is still abstain from driving and is lifting under 10 pounds per CVTS.  He is scheduled to follow-up with Dr. Lavinia Sharps on 8/12 for his posthospital evaluation.  He does report after exercise and that his systolic blood pressure elevates to the 160s and blood pressures at home have been in the 130s to 140s systolically.  He reports improvement to shortness of breath and has no lower extremity edema today.  He is overall euvolemic on examination.  Patient denies chest pain, palpitations, dyspnea, PND, orthopnea, nausea,  vomiting, dizziness, syncope, edema, weight gain, or early satiety.   Home Medications    Current Outpatient Medications  Medication Sig Dispense Refill   aspirin EC 325 MG tablet Take 1 tablet (325 mg total) by mouth daily.     atorvastatin (LIPITOR) 40 MG tablet Take 1 tablet (40 mg total) by mouth every evening. 30 tablet 3   citalopram (CELEXA) 20 MG tablet Take 1 tablet (20 mg total) by mouth daily. 90 tablet 1   Fe Fum-Vit C-Vit B12-FA (TRIGELS-F FORTE) CAPS capsule Take 1 capsule by mouth daily after breakfast. 30 capsule 2   furosemide (LASIX) 40 MG tablet Take 1 tablet (40 mg total) by mouth daily. 5 tablet 0   losartan (COZAAR) 50 MG tablet Take 1 tablet (50 mg total) by mouth daily. 90 tablet 1   Multiple Vitamin (MULTIVITAMIN) tablet Take 1 tablet by mouth daily.     omeprazole (PRILOSEC) 40 MG capsule Take 1 capsule (  40 mg total) by mouth daily. 90 capsule 1   No current facility-administered medications for this visit.     Review of Systems  Please see the history of present illness.    (+) Incisional discomfort   All other systems reviewed and are otherwise negative except as noted above.  Physical Exam    Wt Readings from Last 3 Encounters:  11/03/22 206 lb 6.4 oz (93.6 kg)  10/14/22 210 lb 12.2 oz (95.6 kg)  10/09/22 207 lb 11.2 oz (94.2 kg)   VS: Vitals:   11/03/22 1336  BP: 130/62  Pulse: 78  SpO2: 95%  ,Body mass index is 29.62 kg/m.  Constitutional:      Appearance: Healthy appearance. Not in distress.  Neck:     Vascular: JVD normal.  Pulmonary:     Effort: Pulmonary effort is normal.     Breath sounds: No wheezing. No rales. Diminished in the bases Cardiovascular:     Normal rate. Regular rhythm. Normal S1. Normal S2.      Murmurs: There is no murmur.  Edema:    Peripheral edema absent.  Abdominal:     Palpations: Abdomen is soft non tender. There is no hepatomegaly.  Skin:    General: Skin is warm and dry.  Neurological:     General:  No focal deficit present.     Mental Status: Alert and oriented to person, place and time.     Cranial Nerves: Cranial nerves are intact.  EKG/LABS/ Recent Cardiac Studies    ECG personally reviewed by me today -none completed today  Lab Results  Component Value Date   WBC 5.6 10/14/2022   HGB 8.7 (L) 10/14/2022   HCT 25.6 (L) 10/14/2022   MCV 97.0 10/14/2022   PLT 129 (L) 10/14/2022   Lab Results  Component Value Date   CREATININE 1.07 10/13/2022   BUN 18 10/13/2022   NA 136 10/13/2022   K 4.1 10/13/2022   CL 102 10/13/2022   CO2 26 10/13/2022   Lab Results  Component Value Date   ALT 26 10/09/2022   AST 19 10/09/2022   ALKPHOS 95 10/09/2022   BILITOT 0.7 10/09/2022   Lab Results  Component Value Date   CHOL 186 10/03/2022   HDL 47 10/03/2022   LDLCALC 124 (H) 10/03/2022   TRIG 75 10/03/2022   CHOLHDL 4.0 10/03/2022    Lab Results  Component Value Date   HGBA1C 5.2 10/09/2022     Assessment & Plan    1.  Coronary artery disease: -s/p NSTEMI 09/2022 with CABG x 1 -Today patient reports that he has not experienced any angina or chest discomfort since hospitalization. -Continue GDMT with ASA 325 mg, Lipitor 40 mg, -Patient is scheduled to follow-up with thoracic surgery next week. -Patient is fine to proceed with cardiac rehab pending thoracic surgery's recommendation -Patient would like to be [facility] for rehab at [facility]  2.  Nonrheumatic AVR: -s/p AVR with Inspiris Resilia pericardial valve.  Today patient is euvolemic on examination and reports improvement to shortness of breath. -SBE prophylaxis discussed  3.  History of AF: -s/p AF ablation 06/2016 with no recurrence -Today patient is sinus rhythm with rate of 78 bpm. -Continue ASA 325 mg  4.  Essential hypertension: -Patient's blood pressure today was controlled at 130/62 and has been elevated following exercise in the 160s systolically. - discontinue losartan 25 mg and start losartan 50 mg  daily -BMET in 2 weeks  5.  Hyperlipidemia: -Patient's  last LDL cholesterol was 124 -We will plan to increase lipids to 80 mg if LDL elevated after checking in 3 months.    Cardiac Rehabilitation Eligibility Assessment  The patient is ready to start cardiac rehabilitation from a cardiac standpoint.     Disposition: Follow-up with None or APP in 3 months    Medication Adjustments/Labs and Tests Ordered: Current medicines are reviewed at length with the patient today.  Concerns regarding medicines are outlined above.   Signed, Napoleon Form, Leodis Rains, NP 11/03/2022, 2:34 PM [facility]

## 2022-11-03 ENCOUNTER — Ambulatory Visit: Payer: Medicare Other | Attending: Nurse Practitioner | Admitting: Nurse Practitioner

## 2022-11-03 ENCOUNTER — Encounter: Payer: Self-pay | Admitting: Nurse Practitioner

## 2022-11-03 VITALS — BP 130/62 | HR 78 | Ht 70.0 in | Wt 206.4 lb

## 2022-11-03 DIAGNOSIS — Z953 Presence of xenogenic heart valve: Secondary | ICD-10-CM | POA: Insufficient documentation

## 2022-11-03 DIAGNOSIS — I4891 Unspecified atrial fibrillation: Secondary | ICD-10-CM | POA: Insufficient documentation

## 2022-11-03 DIAGNOSIS — I2510 Atherosclerotic heart disease of native coronary artery without angina pectoris: Secondary | ICD-10-CM | POA: Insufficient documentation

## 2022-11-03 DIAGNOSIS — E785 Hyperlipidemia, unspecified: Secondary | ICD-10-CM | POA: Diagnosis not present

## 2022-11-03 DIAGNOSIS — I10 Essential (primary) hypertension: Secondary | ICD-10-CM | POA: Diagnosis not present

## 2022-11-03 MED ORDER — LOSARTAN POTASSIUM 50 MG PO TABS: 50.0000 mg | ORAL_TABLET | Freq: Every day | ORAL | 1 refills | Status: DC

## 2022-11-03 NOTE — Patient Instructions (Addendum)
Medication Instructions:  STOP Lasix  INCREASE Losartan 50mg  Take 1 tablet once a day  *If you need a refill on your cardiac medications before your next appointment, please call your pharmacy*   Lab Work: 2 weeks BMET(on 11/17/2022 at [facility])  If you have labs (blood work) drawn today and your tests are completely normal, you will receive your results only by: MyChart Message (if you have MyChart) OR A paper copy in the mail If you have any lab test that is abnormal or we need to change your treatment, we will call you to review the results.   Testing/Procedures: NONE ORDERED   Follow-Up: At [facility], you and your health needs are our priority.  As part of our continuing mission to provide you with exceptional heart care, we have created designated Provider Care Teams.  These Care Teams include your primary Cardiologist (physician) and Advanced Practice Providers (APPs -  Physician Assistants and Nurse Practitioners) who all work together to provide you with the care you need, when you need it.  We recommend signing up for the patient portal called "MyChart".  Sign up information is provided on this After Visit Summary.  MyChart is used to connect with patients for Virtual Visits (Telemedicine).  Patients are able to view lab/test results, encounter notes, upcoming appointments, etc.  Non-urgent messages can be sent to your provider as well.   To learn more about what you can do with MyChart, go to [email or web address].    Your next appointment:   3 month(s)  Provider:   Verne Carrow, MD or Robin Searing, NP   Other Instructions Check your blood pressure daily for 2 weeks, then contact the office with your readings.  Make sure to check 2 hours after your medications.   AVOID these things for 30 minutes before checking your blood pressure: No Drinking caffeine. No Drinking alcohol. No Eating. No Smoking. No Exercising.  Five minutes before checking  your blood pressure: Pee. Sit in a dining chair. Avoid sitting in a soft couch or armchair. Be quiet. Do not talk.

## 2022-11-04 NOTE — Patient Instructions (Signed)
Make every effort to maintain a "heart-healthy" lifestyle with regular physical exercise and adherence to a low-fat, low-carbohydrate diet.  Continue to seek regular follow-up appointments with your primary care physician and/or cardiologist.  You may return to driving an automobile as long as you are no longer requiring oral narcotic pain relievers during the daytime.  It would be wise to start driving only short distances during the daylight and gradually increase from there as you feel comfortable.  You are encouraged to enroll and participate in the outpatient cardiac rehab program beginning as soon as practical.   Endocarditis is a potentially serious infection of heart valves or inside lining of the heart.  It occurs more commonly in patients with diseased heart valves (such as patient's with aortic or mitral valve disease) and in patients who have undergone heart valve repair or replacement.  Certain surgical and dental procedures may put you at risk, such as dental cleaning, other dental procedures, or any surgery involving the respiratory, urinary, gastrointestinal tract, gallbladder or prostate gland.   To minimize your chances for develooping endocarditis, maintain good oral health and seek prompt medical attention for any infections involving the mouth, teeth, gums, skin or urinary tract.    Always notify your doctor or dentist about your underlying heart valve condition before having any invasive procedures. You will need to take antibiotics before certain procedures, including all routine dental cleanings or other dental procedures.  Your cardiologist or dentist should prescribe these antibiotics for you to be taken ahead of time.

## 2022-11-04 NOTE — Progress Notes (Signed)
      [address]       Jon [place] [address]             [phone]     HPI: Patient returns for routine postoperative follow-up having undergone CABG x 1 and AVR on 10/10/2022 with Dr. Laneta Simmers.  The patient's early postoperative recovery while in the hospital was unremarkable and patient progressed without complication Since hospital discharge the patient reports overall he is doing well.  He has some occasional soreness.  He is no longer using pain medication.  He is ambulating about 3/4 a mile a day.  He states that he is due to start Cardiac Rehabilitation on Wednesday.  Appetite and bowel habits have returned to normal.   Current Outpatient Medications  Medication Sig Dispense Refill   aspirin EC 325 MG tablet Take 1 tablet (325 mg total) by mouth daily.     atorvastatin (LIPITOR) 40 MG tablet Take 1 tablet (40 mg total) by mouth every evening. 30 tablet 3   citalopram (CELEXA) 20 MG tablet Take 1 tablet (20 mg total) by mouth daily. 90 tablet 1   Fe Fum-Vit C-Vit B12-FA (TRIGELS-F FORTE) CAPS capsule Take 1 capsule by mouth daily after breakfast. 30 capsule 2   furosemide (LASIX) 40 MG tablet Take 1 tablet (40 mg total) by mouth daily. 5 tablet 0   losartan (COZAAR) 50 MG tablet Take 1 tablet (50 mg total) by mouth daily. 90 tablet 1   Multiple Vitamin (MULTIVITAMIN) tablet Take 1 tablet by mouth daily.     omeprazole (PRILOSEC) 40 MG capsule Take 1 capsule (40 mg total) by mouth daily. 90 capsule 1   No current facility-administered medications for this visit.    Physical Exam:  BP 118/66   Pulse 80   Resp 20   Ht 5\' 10"  (1.778 m)   Wt 207 lb (93.9 kg)   SpO2 96% Comment: RA  BMI 29.70 kg/m    Gen:NAD Heart: RRR Lungs: CTA bilaterally Ext: no edema, some area of scar tissue along EVH site right leg Neuro:grossly intact  Diagnostic Tests:  Narrative & Impression  CLINICAL DATA:  Follow-up CABG   EXAM: CHEST - 2 VIEW   COMPARISON:  10/12/2022    FINDINGS: Previous median sternotomy, CABG and aortic valve replacement. Heart size is normal. Aortic atherosclerotic calcification is present. The right chest is clear. Minimal residual linear atelectasis or scar at the left lung base. No pulmonary edema or pleural effusion. Chronic degenerative changes affect spine.   IMPRESSION: Previous CABG and aortic valve replacement. Minimal residual linear atelectasis or scar at the left lung base.    Electronically Signed   By: Paulina Fusi M.D.   On: 11/10/2022 12:40     A/P:  S/P CABG x 1 and AVR HTN- Cozaar recently increased at Cardiology visit, BP well controlled today Cardiac Rehabilitation- Patient is enrolled and due to start on Wednsday Activity- please observe sternal precautions as discussed, continue to increase ambulation as tolerated Endocarditis information provided RTC prn  Lowella Dandy, PA-C [facility] [phone]

## 2022-11-07 ENCOUNTER — Other Ambulatory Visit: Payer: Self-pay | Admitting: Surgery

## 2022-11-07 DIAGNOSIS — I2510 Atherosclerotic heart disease of native coronary artery without angina pectoris: Secondary | ICD-10-CM

## 2022-11-10 ENCOUNTER — Ambulatory Visit
Admission: RE | Admit: 2022-11-10 | Discharge: 2022-11-10 | Disposition: A | Discharge status: Home / Self Care | Payer: Medicare Other | Source: Ambulatory Visit | Attending: Surgery | Admitting: Surgery

## 2022-11-10 ENCOUNTER — Ambulatory Visit (INDEPENDENT_AMBULATORY_CARE_PROVIDER_SITE_OTHER): Payer: Medicare Other | Admitting: Physician Assistant

## 2022-11-10 VITALS — BP 118/66 | HR 80 | Resp 20 | Ht 70.0 in | Wt 207.0 lb

## 2022-11-10 DIAGNOSIS — Z951 Presence of aortocoronary bypass graft: Secondary | ICD-10-CM | POA: Diagnosis not present

## 2022-11-10 DIAGNOSIS — I2510 Atherosclerotic heart disease of native coronary artery without angina pectoris: Secondary | ICD-10-CM

## 2022-11-10 DIAGNOSIS — Z953 Presence of xenogenic heart valve: Secondary | ICD-10-CM

## 2022-11-11 ENCOUNTER — Telehealth (HOSPITAL_COMMUNITY): Payer: Self-pay | Admitting: *Deleted

## 2022-11-12 ENCOUNTER — Encounter (HOSPITAL_COMMUNITY)
Admission: RE | Admit: 2022-11-12 | Discharge: 2022-11-12 | Disposition: A | Discharge status: Home / Self Care | Payer: Medicare Other | Source: Ambulatory Visit | Attending: Thoracic Surgery (Cardiothoracic Vascular Surgery) | Admitting: Thoracic Surgery (Cardiothoracic Vascular Surgery)

## 2022-11-12 VITALS — Ht 70.0 in | Wt 210.1 lb

## 2022-11-12 DIAGNOSIS — I214 Non-ST elevation (NSTEMI) myocardial infarction: Secondary | ICD-10-CM | POA: Insufficient documentation

## 2022-11-12 DIAGNOSIS — Z951 Presence of aortocoronary bypass graft: Secondary | ICD-10-CM | POA: Insufficient documentation

## 2022-11-12 DIAGNOSIS — Z953 Presence of xenogenic heart valve: Secondary | ICD-10-CM | POA: Insufficient documentation

## 2022-11-12 NOTE — Patient Instructions (Signed)
Patient Instructions  Patient Details  Name: Jon Jackson MRN: [id number] Date of Birth: 1950/06/21 Referring Provider:  Alleen Borne, MD  Below are your personal goals for exercise, nutrition, and risk factors. Our goal is to help you stay on track towards obtaining and maintaining these goals. We will be discussing your progress on these goals with you throughout the program.  Initial Exercise Prescription:  Initial Exercise Prescription - 11/12/22 1600       Date of Initial Exercise RX and Referring Provider   Date 11/12/22    Referring Provider Dr. Laneta Simmers      Oxygen   Maintain Oxygen Saturation 88% or higher      Treadmill   MPH 2    Grade 0.5    Minutes 15      REL-XR   Level 2    Speed 50    Minutes 15      Prescription Details   Frequency (times per week) 3    Duration Progress to 30 minutes of continuous aerobic without signs/symptoms of physical distress      Intensity   THRR 40-80% of Max Heartrate 97-131    Ratings of Perceived Exertion 11-13      Resistance Training   Training Prescription Yes    Weight 4    Reps 10-15             Exercise Goals: Frequency: Be able to perform aerobic exercise two to three times per week in program working toward 2-5 days per week of home exercise.  Intensity: Work with a perceived exertion of 11 (fairly light) - 15 (hard) while following your exercise prescription.  We will make changes to your prescription with you as you progress through the program.   Duration: Be able to do 30 to 45 minutes of continuous aerobic exercise in addition to a 5 minute warm-up and a 5 minute cool-down routine.   Nutrition Goals: Your personal nutrition goals will be established when you do your nutrition analysis with the dietician.  The following are general nutrition guidelines to follow: Cholesterol < 200mg /day Sodium < 1500mg /day Fiber: Men over 50 yrs - 30 grams per day  Personal Goals:  Personal Goals and Risk  Factors at Admission - 11/12/22 1506       Core Components/Risk Factors/Patient Goals on Admission    Weight Management Weight Maintenance    Hypertension Yes    Intervention Provide education on lifestyle modifcations including regular physical activity/exercise, weight management, moderate sodium restriction and increased consumption of fresh fruit, vegetables, and low fat dairy, alcohol moderation, and smoking cessation.;Monitor prescription use compliance.    Expected Outcomes Short Term: Continued assessment and intervention until BP is < 140/73mm HG in hypertensive participants. < 130/8mm HG in hypertensive participants with diabetes, heart failure or chronic kidney disease.;Long Term: Maintenance of blood pressure at goal levels.    Lipids Yes    Intervention Provide education and support for participant on nutrition & aerobic/resistive exercise along with prescribed medications to achieve LDL 70mg , HDL >40mg .    Expected Outcomes Short Term: Participant states understanding of desired cholesterol values and is compliant with medications prescribed. Participant is following exercise prescription and nutrition guidelines.;Long Term: Cholesterol controlled with medications as prescribed, with individualized exercise RX and with personalized nutrition plan. Value goals: LDL < 70mg , HDL > 40 mg.             Tobacco Use Initial Evaluation: Social History   Tobacco Use  Smoking Status  Some Days   Current packs/day: 0.25   Average packs/day: 0.3 packs/day for 47.0 years (11.8 ttl pk-yrs)   Types: Cigars, Cigarettes  Smokeless Tobacco Never  Tobacco Comments   Cigars- chews on tips    Exercise Goals and Review:  Exercise Goals     Row Name 11/12/22 1606             Exercise Goals   Increase Physical Activity Yes       Intervention Provide advice, education, support and counseling about physical activity/exercise needs.;Develop an individualized exercise prescription for  aerobic and resistive training based on initial evaluation findings, risk stratification, comorbidities and participant's personal goals.       Expected Outcomes Short Term: Attend rehab on a regular basis to increase amount of physical activity.;Long Term: Exercising regularly at least 3-5 days a week.;Long Term: Add in home exercise to make exercise part of routine and to increase amount of physical activity.       Increase Strength and Stamina Yes       Intervention Provide advice, education, support and counseling about physical activity/exercise needs.;Develop an individualized exercise prescription for aerobic and resistive training based on initial evaluation findings, risk stratification, comorbidities and participant's personal goals.       Expected Outcomes Short Term: Increase workloads from initial exercise prescription for resistance, speed, and METs.;Short Term: Perform resistance training exercises routinely during rehab and add in resistance training at home;Long Term: Improve cardiorespiratory fitness, muscular endurance and strength as measured by increased METs and functional capacity ( )       Able to understand and use rate of perceived exertion (RPE) scale Yes       Intervention Provide education and explanation on how to use RPE scale       Expected Outcomes Short Term: Able to use RPE daily in rehab to express subjective intensity level;Long Term:  Able to use RPE to guide intensity level when exercising independently       Knowledge and understanding of Target Heart Rate Range (THRR) Yes       Intervention Provide education and explanation of THRR including how the numbers were predicted and where they are located for reference       Expected Outcomes Long Term: Able to use THRR to govern intensity when exercising independently;Short Term: Able to state/look up THRR;Short Term: Able to use daily as guideline for intensity in rehab       Able to check pulse independently Yes        Intervention Provide education and demonstration on how to check pulse in carotid and radial arteries.;Review the importance of being able to check your own pulse for safety during independent exercise       Expected Outcomes Short Term: Able to explain why pulse checking is important during independent exercise;Long Term: Able to check pulse independently and accurately       Understanding of Exercise Prescription Yes       Intervention Provide education, explanation, and written materials on patient's individual exercise prescription       Expected Outcomes Short Term: Able to explain program exercise prescription;Long Term: Able to explain home exercise prescription to exercise independently              Copy of goals given to participant.

## 2022-11-12 NOTE — Progress Notes (Signed)
Cardiac Individual Treatment Plan  Patient Details  Name: Jon Jackson MRN: [id number] Date of Birth: 1950/10/30 Referring Provider:   Flowsheet Row CARDIAC REHAB PHASE II ORIENTATION from 11/12/2022 in [facility] CARDIAC REHABILITATION  Referring Provider Dr. Laneta Simmers       Initial Encounter Date:  Flowsheet Row CARDIAC REHAB PHASE II ORIENTATION from 11/12/2022 in [place] CARDIAC REHABILITATION  Date 11/12/22       Visit Diagnosis: S/P aortic valve replacement with bioprosthetic valve  NSTEMI (non-ST elevated myocardial infarction) (HCC)  S/P CABG x 1  Patient's Home Medications on Admission:  Current Outpatient Medications:    aspirin EC 325 MG tablet, Take 1 tablet (325 mg total) by mouth daily. (Patient taking differently: Take 81 mg by mouth daily.), Disp: , Rfl:    atorvastatin (LIPITOR) 40 MG tablet, Take 1 tablet (40 mg total) by mouth every evening., Disp: 30 tablet, Rfl: 3   citalopram (CELEXA) 20 MG tablet, Take 1 tablet (20 mg total) by mouth daily., Disp: 90 tablet, Rfl: 1   Fe Fum-Vit C-Vit B12-FA (TRIGELS-F FORTE) CAPS capsule, Take 1 capsule by mouth daily after breakfast., Disp: 30 capsule, Rfl: 2   losartan (COZAAR) 50 MG tablet, Take 1 tablet (50 mg total) by mouth daily., Disp: 90 tablet, Rfl: 1   Multiple Vitamin (MULTIVITAMIN) tablet, Take 1 tablet by mouth daily., Disp: , Rfl:    omeprazole (PRILOSEC) 40 MG capsule, Take 1 capsule (40 mg total) by mouth daily., Disp: 90 capsule, Rfl: 1  Past Medical History: Past Medical History:  Diagnosis Date   Anemia    as a child   Anxiety    Arthritis    Atrial fibrillation (HCC)    Basal cell carcinoma    head   Coronary artery disease    Depression    Dysrhythmia    GERD (gastroesophageal reflux disease)    Heart murmur    Hyperlipidemia    Myocardial infarction (HCC) 10/02/2022   Pneumonia 02/2022   Squamous carcinoma     Tobacco Use: Social History   Tobacco Use  Smoking Status Some Days    Current packs/day: 0.25   Average packs/day: 0.3 packs/day for 47.0 years (11.8 ttl pk-yrs)   Types: Cigars, Cigarettes  Smokeless Tobacco Never  Tobacco Comments   Cigars- chews on tips    Labs: Review Flowsheet  More data exists      Latest Ref Rng & Units 12/27/2019 07/10/2022 10/03/2022 10/09/2022 10/10/2022  Labs for ITP Cardiac and Pulmonary Rehab  Cholestrol 0 - 200 mg/dL 956  213  086  - -  LDL (calc) 0 - 99 mg/dL 578  469  629  - -  HDL-C >40 mg/dL 47  50  47  - -  Trlycerides <150 mg/dL 528  90  75  - -  Hemoglobin A1c 4.8 - 5.6 % - - - 5.2  -  PH, Arterial 7.35 - 7.45 - - 7.393  7.41  7.424  7.313  7.253  7.327  7.390  7.355  7.303   PCO2 arterial 32 - 48 mmHg - - 36.6  38  29.8  46.0  52.1  45.9  40.1  42.5  51.8   Bicarbonate 20.0 - 28.0 mmol/L - - 22.3  24.9  25.0  24.1  19.7  23.3  23.0  24.0  24.2  31.1  23.8  25.7   TCO2 22 - 32 mmol/L - - 23  26  26   - 21  25  25  25  26  26  25  27   33  25  28  27  28    Acid-base deficit 0.0 - 2.0 mmol/L - - 2.0  1.0  1.0  0.3  4.0  3.0  5.0  2.0  1.0  2.0  1.0   O2 Saturation % - - 95  73  76  99.4  99  99  96  100  100  81  100  100     Details       Multiple values from one day are sorted in reverse-chronological order         Capillary Blood Glucose: Lab Results  Component Value Date   GLUCAP 106 (H) 10/14/2022   GLUCAP 112 (H) 10/13/2022   GLUCAP 106 (H) 10/13/2022   GLUCAP 85 10/13/2022   GLUCAP 90 10/13/2022     Exercise Target Goals: Exercise Program Goal: Individual exercise prescription set using results from initial 6 min walk test and THRR while considering  patient's activity barriers and safety.   Exercise Prescription Goal: Starting with aerobic activity 30 plus minutes a day, 3 days per week for initial exercise prescription. Provide home exercise prescription and guidelines that participant acknowledges understanding prior to discharge.  Activity Barriers & Risk Stratification:  Activity Barriers  & Cardiac Risk Stratification - 11/12/22 1413       Activity Barriers & Cardiac Risk Stratification   Activity Barriers Other (comment)    Comments Patient has limited mobility in his left arm due to old shoulder injury repaired with surgery.    Cardiac Risk Stratification High             6 Minute Walk:  6 Minute Walk     Row Name 11/12/22 1603         6 Minute Walk   Phase Initial     Distance 1600 feet     Walk Time 6 minutes     # of Rest Breaks 0     MPH 3.03     METS 3.11     RPE 11     VO2 Peak 10.9     Symptoms No     Resting HR 63 bpm     Resting BP 160/70     Resting Oxygen Saturation  96 %     Exercise Oxygen Saturation  during 6 min walk 96 %     Max Ex. HR 77 bpm     Max Ex. BP 156/68     2 Minute Post BP 146/64              Oxygen Initial Assessment:   Oxygen Re-Evaluation:   Oxygen Discharge (Final Oxygen Re-Evaluation):   Initial Exercise Prescription:  Initial Exercise Prescription - 11/12/22 1600       Date of Initial Exercise RX and Referring Provider   Date 11/12/22    Referring Provider Dr. Laneta Simmers      Oxygen   Maintain Oxygen Saturation 88% or higher      Treadmill   MPH 2    Grade 0.5    Minutes 15      REL-XR   Level 2    Speed 50    Minutes 15      Prescription Details   Frequency (times per week) 3    Duration Progress to 30 minutes of continuous aerobic without signs/symptoms of physical distress      Intensity   THRR 40-80% of Max Heartrate  97-131    Ratings of Perceived Exertion 11-13      Resistance Training   Training Prescription Yes    Weight 4    Reps 10-15             Perform Capillary Blood Glucose checks as needed.  Exercise Prescription Changes:   Exercise Comments:   Exercise Goals and Review:   Exercise Goals     Row Name 11/12/22 1606             Exercise Goals   Increase Physical Activity Yes       Intervention Provide advice, education, support and counseling  about physical activity/exercise needs.;Develop an individualized exercise prescription for aerobic and resistive training based on initial evaluation findings, risk stratification, comorbidities and participant's personal goals.       Expected Outcomes Short Term: Attend rehab on a regular basis to increase amount of physical activity.;Long Term: Exercising regularly at least 3-5 days a week.;Long Term: Add in home exercise to make exercise part of routine and to increase amount of physical activity.       Increase Strength and Stamina Yes       Intervention Provide advice, education, support and counseling about physical activity/exercise needs.;Develop an individualized exercise prescription for aerobic and resistive training based on initial evaluation findings, risk stratification, comorbidities and participant's personal goals.       Expected Outcomes Short Term: Increase workloads from initial exercise prescription for resistance, speed, and METs.;Short Term: Perform resistance training exercises routinely during rehab and add in resistance training at home;Long Term: Improve cardiorespiratory fitness, muscular endurance and strength as measured by increased METs and functional capacity ( )       Able to understand and use rate of perceived exertion (RPE) scale Yes       Intervention Provide education and explanation on how to use RPE scale       Expected Outcomes Short Term: Able to use RPE daily in rehab to express subjective intensity level;Long Term:  Able to use RPE to guide intensity level when exercising independently       Knowledge and understanding of Target Heart Rate Range (THRR) Yes       Intervention Provide education and explanation of THRR including how the numbers were predicted and where they are located for reference       Expected Outcomes Long Term: Able to use THRR to govern intensity when exercising independently;Short Term: Able to state/look up THRR;Short Term: Able to  use daily as guideline for intensity in rehab       Able to check pulse independently Yes       Intervention Provide education and demonstration on how to check pulse in carotid and radial arteries.;Review the importance of being able to check your own pulse for safety during independent exercise       Expected Outcomes Short Term: Able to explain why pulse checking is important during independent exercise;Long Term: Able to check pulse independently and accurately       Understanding of Exercise Prescription Yes       Intervention Provide education, explanation, and written materials on patient's individual exercise prescription       Expected Outcomes Short Term: Able to explain program exercise prescription;Long Term: Able to explain home exercise prescription to exercise independently                Exercise Goals Re-Evaluation :    Discharge Exercise Prescription (Final Exercise Prescription Changes):   Nutrition:  Target Goals: Understanding of nutrition guidelines, daily intake of sodium 1500mg , cholesterol 200mg , calories 30% from fat and 7% or less from saturated fats, daily to have 5 or more servings of fruits and vegetables.  Biometrics:  Pre Biometrics - 11/12/22 1607       Pre Biometrics   Height 5\' 10"  (1.778 m)    Weight 95.3 kg    Waist Circumference 42 inches    Hip Circumference 41 inches    Waist to Hip Ratio 1.02 %    BMI (Calculated) 30.15    Grip Strength 22.9 kg    Flexibility 0 in    Single Leg Stand 21.64 seconds              Nutrition Therapy Plan and Nutrition Goals:   Nutrition Assessments:  MEDIFICTS Score Key: ?70 Need to make dietary changes  40-70 Heart Healthy Diet ? 40 Therapeutic Level Cholesterol Diet   Picture Your Plate Scores: <09 Unhealthy dietary pattern with much room for improvement. 41-50 Dietary pattern unlikely to meet recommendations for good health and room for improvement. 51-60 More healthful dietary  pattern, with some room for improvement.  >60 Healthy dietary pattern, although there may be some specific behaviors that could be improved.    Nutrition Goals Re-Evaluation:   Nutrition Goals Discharge (Final Nutrition Goals Re-Evaluation):   Psychosocial: Target Goals: Acknowledge presence or absence of significant depression and/or stress, maximize coping skills, provide positive support system. Participant is able to verbalize types and ability to use techniques and skills needed for reducing stress and depression.  Initial Review & Psychosocial Screening:  Initial Psych Review & Screening - 11/12/22 1506       Initial Review   Current issues with History of Depression      Family Dynamics   Good Support System? Yes    Comments Patient's wife and 4 chidlren are his support.      Barriers   Psychosocial barriers to participate in program There are no identifiable barriers or psychosocial needs.      Screening Interventions   Interventions Encouraged to exercise;Provide feedback about the scores to participant    Expected Outcomes Short Term goal: Utilizing psychosocial counselor, staff and physician to assist with identification of specific Stressors or current issues interfering with healing process. Setting desired goal for each stressor or current issue identified.;Long Term Goal: Stressors or current issues are controlled or eliminated.;Short Term goal: Identification and review with participant of any Quality of Life or Depression concerns found by scoring the questionnaire.;Long Term goal: The participant improves quality of Life and PHQ9 Scores as seen by post scores and/or verbalization of changes             Quality of Life Scores:  Quality of Life - 11/12/22 1608       Quality of Life   Select Quality of Life      Quality of Life Scores   Health/Function Pre 19.39 %    Socioeconomic Pre 21.64 %    Psych/Spiritual Pre 20.86 %    Family Pre 27.6 %    GLOBAL  Pre 21.42 %            Scores of 19 and below usually indicate a poorer quality of life in these areas.  A difference of  2-3 points is a clinically meaningful difference.  A difference of 2-3 points in the total score of the Quality of Life Index has been associated with significant improvement in overall quality of  life, self-image, physical symptoms, and general health in studies assessing change in quality of life.  PHQ-9: Review Flowsheet  More data exists      11/12/2022 10/17/2022 07/10/2022 05/09/2022 02/12/2021  Depression screen PHQ 2/9  Decreased Interest 0 1 1 0 0  Down, Depressed, Hopeless 0 0 0 0 0  PHQ - 2 Score 0 1 1 0 0  Altered sleeping 0 0 0 - 0  Tired, decreased energy 1 1 1  - 0  Change in appetite 0 0 0 - 1  Feeling bad or failure about yourself  0 0 0 - 0  Trouble concentrating 1 1 0 - 0  Moving slowly or fidgety/restless 0 0 0 - 0  Suicidal thoughts 0 0 0 - 0  PHQ-9 Score 2 3 2  - 1  Difficult doing work/chores Not difficult at all Not difficult at all Somewhat difficult - Not difficult at all    Details           Interpretation of Total Score  Total Score Depression Severity:  1-4 = Minimal depression, 5-9 = Mild depression, 10-14 = Moderate depression, 15-19 = Moderately severe depression, 20-27 = Severe depression   Psychosocial Evaluation and Intervention:  Psychosocial Evaluation - 11/12/22 1507       Psychosocial Evaluation & Interventions   Interventions Relaxation education;Stress management education;Encouraged to exercise with the program and follow exercise prescription    Comments Patient referred to cardiac rehab with S/P Aortic Valve Replacement; CABGx1 and NSTEMI. His PHQ-9 score was 2 due to lack of energy and trouble concentrating at times. He does have a history of depression and is currently on Celexa. He says he has been taking this medication for 10+ years and feels it manages his depression well. He denies any anxiety or significant  stressors in his life. He quit smoking during his hospitalization 10/02/22. He was using nicotine patches but says he quit using. He says he is able to deal with his urges to smoke. He says these episodes are worse if he is around someone smoking so he tries to avoid this. No one in his family smokes. He and his son-in-law quit at the same time. He reports sleeping well. He is retired from [employer]. 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 He has a lot of farm animals to care for and he says he really enjoys being outside caring for his animals and taking care of his farm. He is currently not exercising but says he does do a lot of walking on his farm. His goals for the program are to improve his stamina and to feel better overall and be able to get back to his normal activities. He has no barriers identified to participate in the program.    Expected Outcomes Short Term: attend the program consistently; Long Term: Improve his stamina and feel better.    Continue Psychosocial Services  Follow up required by staff             Psychosocial Re-Evaluation:   Psychosocial Discharge (Final Psychosocial Re-Evaluation):   Vocational Rehabilitation: Provide vocational rehab assistance to qualifying candidates.   Vocational Rehab Evaluation & Intervention:  Vocational Rehab - 11/12/22 1506       Initial Vocational Rehab Evaluation & Intervention   Assessment shows need for Vocational Rehabilitation No      Vocational Rehab Re-Evaulation   Comments Patient is retired.  Education: Education Goals: Education classes will be provided on a weekly basis, covering required topics. Participant will state understanding/return demonstration of topics presented.  Learning Barriers/Preferences:  Learning Barriers/Preferences - 11/12/22 1505       Learning Barriers/Preferences   Learning Barriers None     Learning Preferences Audio             Education Topics: Hypertension, Hypertension Reduction -Define heart disease and high blood pressure. Discus how high blood pressure affects the body and ways to reduce high blood pressure.   Exercise and Your Heart -Discuss why it is important to exercise, the FITT principles of exercise, normal and abnormal responses to exercise, and how to exercise safely.   Angina -Discuss definition of angina, causes of angina, treatment of angina, and how to decrease risk of having angina.   Cardiac Medications -Review what the following cardiac medications are used for, how they affect the body, and side effects that may occur when taking the medications.  Medications include Aspirin, Beta blockers, calcium channel blockers, ACE Inhibitors, angiotensin receptor blockers, diuretics, digoxin, and antihyperlipidemics.   Congestive Heart Failure -Discuss the definition of CHF, how to live with CHF, the signs and symptoms of CHF, and how keep track of weight and sodium intake.   Heart Disease and Intimacy -Discus the effect sexual activity has on the heart, how changes occur during intimacy as we age, and safety during sexual activity.   Smoking Cessation / COPD -Discuss different methods to quit smoking, the health benefits of quitting smoking, and the definition of COPD.   Nutrition I: Fats -Discuss the types of cholesterol, what cholesterol does to the heart, and how cholesterol levels can be controlled.   Nutrition II: Labels -Discuss the different components of food labels and how to read food label   Heart Parts/Heart Disease and PAD -Discuss the anatomy of the heart, the pathway of blood circulation through the heart, and these are affected by heart disease.   Stress I: Signs and Symptoms -Discuss the causes of stress, how stress may lead to anxiety and depression, and ways to limit stress.   Stress II: Relaxation -Discuss different  types of relaxation techniques to limit stress.   Warning Signs of Stroke / TIA -Discuss definition of a stroke, what the signs and symptoms are of a stroke, and how to identify when someone is having stroke.   Knowledge Questionnaire Score:   Core Components/Risk Factors/Patient Goals at Admission:  Personal Goals and Risk Factors at Admission - 11/12/22 1506       Core Components/Risk Factors/Patient Goals on Admission    Weight Management Weight Maintenance    Hypertension Yes    Intervention Provide education on lifestyle modifcations including regular physical activity/exercise, weight management, moderate sodium restriction and increased consumption of fresh fruit, vegetables, and low fat dairy, alcohol moderation, and smoking cessation.;Monitor prescription use compliance.    Expected Outcomes Short Term: Continued assessment and intervention until BP is < 140/103mm HG in hypertensive participants. < 130/72mm HG in hypertensive participants with diabetes, heart failure or chronic kidney disease.;Long Term: Maintenance of blood pressure at goal levels.    Lipids Yes    Intervention Provide education and support for participant on nutrition & aerobic/resistive exercise along with prescribed medications to achieve LDL 70mg , HDL >40mg .    Expected Outcomes Short Term: Participant states understanding of desired cholesterol values and is compliant with medications prescribed. Participant is following exercise prescription and nutrition guidelines.;Long Term: Cholesterol controlled with medications as prescribed,  with individualized exercise RX and with personalized nutrition plan. Value goals: LDL < 70mg , HDL > 40 mg.             Core Components/Risk Factors/Patient Goals Review:    Core Components/Risk Factors/Patient Goals at Discharge (Final Review):    ITP Comments:  ITP Comments     Row Name 11/12/22 1611           ITP Comments Patient attend orientation today.   Patient is attendingCardiac Rehabilitation Program.  Documentation for diagnosis [facility] in [facility] 10[facility]10[facility].  Reviewed medical chart, RPE/RPD, gym safety, and program guidelines.  Patient was fitted to equipment they will be using during rehab.  Patient is scheduled to start exercise on 11/17/22 at 915.   Initial ITP created and sent for review and signature by Dr. Dina Rich, Medical Director for Cardiac Rehabilitation Program.                Comments: Initial ITP

## 2022-11-17 ENCOUNTER — Encounter (HOSPITAL_COMMUNITY): Admission: RE | Admit: 2022-11-17 | Payer: Medicare Other | Source: Ambulatory Visit

## 2022-11-17 DIAGNOSIS — Z953 Presence of xenogenic heart valve: Secondary | ICD-10-CM | POA: Diagnosis not present

## 2022-11-17 DIAGNOSIS — Z951 Presence of aortocoronary bypass graft: Secondary | ICD-10-CM

## 2022-11-17 DIAGNOSIS — I214 Non-ST elevation (NSTEMI) myocardial infarction: Secondary | ICD-10-CM | POA: Diagnosis not present

## 2022-11-17 NOTE — Progress Notes (Signed)
Daily Session Note  Patient Details  Name: Jon Jackson MRN: [id number] Date of Birth: 1951/01/07 Referring Provider:   Flowsheet Row CARDIAC REHAB PHASE II ORIENTATION from 11/12/2022 in [facility] CARDIAC REHABILITATION  Referring Provider Dr. Laneta Simmers       Encounter Date: 11/17/2022  Check In:  Session Check In - 11/17/22 0900       Check-In   Supervising physician immediately available to respond to emergencies See telemetry face sheet for immediately available MD    Staff Present Fabio Pierce, MA, RCEP, CCRP, Dow Adolph, RN, Pleas Koch, RN, BSN    Virtual Visit No    Medication changes reported     No    Fall or balance concerns reported    No    Tobacco Cessation No Change    Warm-up and Cool-down Performed on first and last piece of equipment    Resistance Training Performed Yes    VAD Patient? No      Pain Assessment   Currently in Pain? No/denies             Capillary Blood Glucose: No results found for this or any previous visit (from the past 24 hour(s)).    Social History   Tobacco Use  Smoking Status Some Days   Current packs/day: 0.25   Average packs/day: 0.3 packs/day for 47.0 years (11.8 ttl pk-yrs)   Types: Cigars, Cigarettes  Smokeless Tobacco Never  Tobacco Comments   Cigars- chews on tips    Goals Met:  Independence with exercise equipment Exercise tolerated well No report of concerns or symptoms today Strength training completed today  Goals Unmet:  Not Applicable  Comments: Pt able to follow exercise prescription today without complaint.  Will continue to monitor for progression.    Dr. Dina Rich is Medical Director for [facility] Cardiac Rehab

## 2022-11-18 ENCOUNTER — Ambulatory Visit (INDEPENDENT_AMBULATORY_CARE_PROVIDER_SITE_OTHER): Payer: Medicare Other | Admitting: Family Medicine

## 2022-11-18 ENCOUNTER — Encounter: Payer: Self-pay | Admitting: Family Medicine

## 2022-11-18 VITALS — BP 148/72 | HR 63 | Wt 207.6 lb

## 2022-11-18 DIAGNOSIS — I10 Essential (primary) hypertension: Secondary | ICD-10-CM | POA: Diagnosis not present

## 2022-11-18 DIAGNOSIS — D649 Anemia, unspecified: Secondary | ICD-10-CM | POA: Diagnosis not present

## 2022-11-18 DIAGNOSIS — E785 Hyperlipidemia, unspecified: Secondary | ICD-10-CM | POA: Diagnosis not present

## 2022-11-18 DIAGNOSIS — I214 Non-ST elevation (NSTEMI) myocardial infarction: Secondary | ICD-10-CM | POA: Diagnosis not present

## 2022-11-18 NOTE — Patient Instructions (Signed)
Check BP regularly.  Continue your medications.  Labs today.   Follow up in 6 months.

## 2022-11-18 NOTE — Progress Notes (Unsigned)
Subjective:  Patient ID: Jon Jackson, male    DOB: 1950/05/06  Age: 72 y.o. MRN: [id number]  CC: Follow up  HPI:  72 year old male with the below mentioned medical problems presents for follow up.  BP elevated today. Likely spurious as was well controlled recently at cardiology. Losartan recently increased to 50 mg.   Denies chest pain, SOB. Doing well with Cardiac rehab.  Last LDL 124. Now on Lipitor. Needs recheck of lipids. Goal at least less than 70.  Needs labs today to recheck lipids and hemoglobin.  Patient Active Problem List   Diagnosis Date Noted   S/P aortic valve replacement with bioprosthetic valve 10/10/2022   NSTEMI (non-ST elevated myocardial infarction) (HCC) 10/02/2022   Hyperlipidemia 07/10/2022   S/P lumbar fusion 11/19/2020   Osteopenia 11/07/2020   Essential hypertension 11/18/2017   Atrial fibrillation (HCC)    Depression 04/08/2013   Allergic rhinitis 12/12/2012   GERD (gastroesophageal reflux disease) 06/21/2012    Social Hx   Social History   Socioeconomic History   Marital status: Married    Spouse name: Not on file   Number of children: Not on file   Years of education: Not on file   Highest education level: Not on file  Occupational History   Not on file  Tobacco Use   Smoking status: Some Days    Current packs/day: 0.25    Average packs/day: 0.3 packs/day for 47.0 years (11.8 ttl pk-yrs)    Types: Cigars, Cigarettes   Smokeless tobacco: Never   Tobacco comments:    Cigars- chews on tips  Vaping Use   Vaping status: Never Used  Substance and Sexual Activity   Alcohol use: Yes    Alcohol/week: 12.0 standard drinks of alcohol    Types: 12 Cans of beer per week    Comment: daily 3-4 in the summer; soemtimes more on weekends.   Drug use: No   Sexual activity: Yes  Other Topics Concern   Not on file  Social History Narrative   Not on file   Social Determinants of Health   Financial Resource Strain: Low Risk  (05/09/2022)    Overall Financial Resource Strain (CARDIA)    Difficulty of Paying Living Expenses: Not hard at all  Food Insecurity: No Food Insecurity (10/16/2022)   Hunger Vital Sign    Worried About Running Out of Food in the Last Year: Never true    Ran Out of Food in the Last Year: Never true  Transportation Needs: No Transportation Needs (10/16/2022)   PRAPARE - Administrator, Civil Service (Medical): No    Lack of Transportation (Non-Medical): No  Physical Activity: Sufficiently Active (05/09/2022)   Exercise Vital Sign    Days of Exercise per Week: 7 days    Minutes of Exercise per Session: 30 min  Stress: No Stress Concern Present (05/09/2022)   [employer] of Occupational Health - Occupational Stress Questionnaire    Feeling of Stress : Not at all  Social Connections: Moderately Isolated (10/02/2022)   Social Connection and Isolation Panel [NHANES]    Frequency of Communication with Friends and Family: More than three times a week    Frequency of Social Gatherings with Friends and Family: More than three times a week    Attends Religious Services: Never    Database administrator or Organizations: No    Attends Banker Meetings: Never    Marital Status: Married    Review of  Systems Per HPI  Objective:  BP (!) 148/72   Pulse 63   Wt 207 lb 9.6 oz (94.2 kg)   SpO2 97%   BMI 29.79 kg/m      11/18/2022   11:00 AM 11/18/2022   10:34 AM 11/12/2022    4:07 PM  BP/Weight  Systolic BP 148 172   Diastolic BP 72 73   Wt. (Lbs)  207.6 210.1  BMI  29.79 kg/m2 30.15 kg/m2    Physical Exam Vitals and nursing note reviewed.  Constitutional:      General: He is not in acute distress.    Appearance: Normal appearance.  HENT:     Head: Normocephalic and atraumatic.  Eyes:     General:        Right eye: No discharge.        Left eye: No discharge.     Conjunctiva/sclera: Conjunctivae normal.  Cardiovascular:     Rate and Rhythm: Normal rate and regular  rhythm.  Pulmonary:     Effort: Pulmonary effort is normal.     Breath sounds: Normal breath sounds.  Neurological:     Mental Status: He is alert.  Psychiatric:        Mood and Affect: Mood normal.        Behavior: Behavior normal.     Lab Results  Component Value Date   WBC 7.9 11/18/2022   HGB 13.5 11/18/2022   HCT 42.3 11/18/2022   PLT 287 11/18/2022   GLUCOSE 111 (H) 10/13/2022   CHOL 144 11/18/2022   TRIG 103 11/18/2022   HDL 45 11/18/2022   LDLCALC 80 11/18/2022   ALT 26 10/09/2022   AST 19 10/09/2022   NA 136 10/13/2022   K 4.1 10/13/2022   CL 102 10/13/2022   CREATININE 1.07 10/13/2022   BUN 18 10/13/2022   CO2 26 10/13/2022   TSH 1.520 02/24/2017   PSA 0.55 04/07/2014   INR 1.4 (H) 10/10/2022   HGBA1C 5.2 10/09/2022     Assessment & Plan:   Problem List Items Addressed This Visit       Cardiovascular and Mediastinum   NSTEMI (non-ST elevated myocardial infarction) [facility])    Doing well with cardiac rehab.  Continue current medications.      Relevant Medications   atorvastatin (LIPITOR) 80 MG tablet   Essential hypertension - Primary    Has been well-controlled.  Will continue to monitor.  Continue current medications.      Relevant Medications   atorvastatin (LIPITOR) 80 MG tablet     Other   Hyperlipidemia    LDL returned at 80.  Goal less than 70.  Needs increase in Lipitor to 80 mg daily.      Relevant Medications   atorvastatin (LIPITOR) 80 MG tablet   Other Relevant Orders   Lipid panel (Completed)   Other Visit Diagnoses     Normocytic anemia       Relevant Orders   CBC (Completed)   Iron, TIBC and Ferritin Panel (Completed)       Meds ordered this encounter  Medications   atorvastatin (LIPITOR) 80 MG tablet    Sig: Take 1 tablet (80 mg total) by mouth daily.    Dispense:  90 tablet    Refill:  3    Follow-up:  6 months  Dailey Alberson Adriana Simas DO [facility]

## 2022-11-19 ENCOUNTER — Encounter (HOSPITAL_COMMUNITY): Admission: RE | Admit: 2022-11-19 | Payer: Medicare Other | Source: Ambulatory Visit

## 2022-11-19 DIAGNOSIS — Z953 Presence of xenogenic heart valve: Secondary | ICD-10-CM | POA: Diagnosis not present

## 2022-11-19 DIAGNOSIS — Z951 Presence of aortocoronary bypass graft: Secondary | ICD-10-CM | POA: Diagnosis not present

## 2022-11-19 DIAGNOSIS — I214 Non-ST elevation (NSTEMI) myocardial infarction: Secondary | ICD-10-CM | POA: Diagnosis not present

## 2022-11-19 LAB — IRON,TIBC AND FERRITIN PANEL
Ferritin: 66 ng/mL (ref 30–400)
Iron Saturation: 19 % (ref 15–55)
Iron: 62 ug/dL (ref 38–169)
Total Iron Binding Capacity: 319 ug/dL (ref 250–450)
UIBC: 257 ug/dL (ref 111–343)

## 2022-11-19 LAB — CBC
Hematocrit: 42.3 % (ref 37.5–51.0)
Hemoglobin: 13.5 g/dL (ref 13.0–17.7)
MCH: 30.7 pg (ref 26.6–33.0)
MCHC: 31.9 g/dL (ref 31.5–35.7)
MCV: 96 fL (ref 79–97)
Platelets: 287 10*3/uL (ref 150–450)
RBC: 4.4 x10E6/uL (ref 4.14–5.80)
RDW: 11.7 % (ref 11.6–15.4)
WBC: 7.9 10*3/uL (ref 3.4–10.8)

## 2022-11-19 LAB — LIPID PANEL
Chol/HDL Ratio: 3.2 ratio (ref 0.0–5.0)
Cholesterol, Total: 144 mg/dL (ref 100–199)
HDL: 45 mg/dL (ref >39)
LDL Chol Calc (NIH): 80 mg/dL (ref 0–99)
Triglycerides: 103 mg/dL (ref 0–149)
VLDL Cholesterol Cal: 19 mg/dL (ref 5–40)

## 2022-11-19 NOTE — Progress Notes (Signed)
Daily Session Note  Patient Details  Name: Jon Jackson MRN: [id number] Date of Birth: February 09, 1951 Referring Provider:   Flowsheet Row CARDIAC REHAB PHASE II ORIENTATION from 11/12/2022 in [facility] CARDIAC REHABILITATION  Referring Provider Dr. Laneta Simmers       Encounter Date: 11/19/2022  Check In:  Session Check In - 11/19/22 0915       Check-In   Supervising physician immediately available to respond to emergencies See telemetry face sheet for immediately available ER MD    Location [facility]    Staff Present Fabio Pierce, MA, RCEP, CCRP, CCET;Hillary Troutman BSN, RN;Heather Kimberly, [place], Exercise Physiologist;Kamiah Fite Laural Benes, RN, BSN    Virtual Visit No    Medication changes reported     No    Fall or balance concerns reported    No    Warm-up and Cool-down Performed on first and last piece of equipment    Resistance Training Performed Yes    VAD Patient? No    PAD/SET Patient? No      Pain Assessment   Currently in Pain? No/denies    Multiple Pain Sites No             Capillary Blood Glucose: Results for orders placed or performed in visit on 11/18/22 (from the past 24 hour(s))  Lipid panel     Status: None   Collection Time: 11/18/22 11:15 AM  Result Value Ref Range   Cholesterol, Total 144 100 - 199 mg/dL   Triglycerides 952 0 - 149 mg/dL   HDL 45 >84 mg/dL   VLDL Cholesterol Cal 19 5 - 40 mg/dL   LDL Chol Calc (NIH) 80 0 - 99 mg/dL   Chol/HDL Ratio 3.2 0.0 - 5.0 ratio   Narrative   Performed at:  [address], [place], [place]  [id number] Lab Director: Jolene Schimke MD, Phone:  [phone]  CBC     Status: None   Collection Time: 11/18/22 11:15 AM  Result Value Ref Range   WBC 7.9 3.4 - 10.8 x10E3/uL   RBC 4.40 4.14 - 5.80 x10E6/uL   Hemoglobin 13.5 13.0 - 17.7 g/dL   Hematocrit 47.4 25.9 - 51.0 %   MCV 96 79 - 97 fL   MCH 30.7 26.6 - 33.0 pg   MCHC 31.9 31.5 - 35.7 g/dL   RDW 56.3 87.5 - 64.3 %    Platelets 287 150 - 450 x10E3/uL   Narrative   Performed at:  [address], [place], [place]  [id number] Lab Director: Jolene Schimke MD, Phone:  [phone]  Iron, TIBC and Ferritin Panel     Status: None   Collection Time: 11/18/22 11:15 AM  Result Value Ref Range   Total Iron Binding Capacity 319 250 - 450 ug/dL   UIBC 093 235 - 573 ug/dL   Iron 62 38 - 220 ug/dL   Iron Saturation 19 15 - 55 %   Ferritin 66 30 - 400 ng/mL   Narrative   Performed at:  [address] [place], [place]  [id number] Lab Director: Jolene Schimke MD, Phone:  [phone]      Social History   Tobacco Use  Smoking Status Some Days   Current packs/day: 0.25   Average packs/day: 0.3 packs/day for 47.0 years (11.8 ttl pk-yrs)   Types: Cigars, Cigarettes  Smokeless Tobacco Never  Tobacco Comments   Cigars- chews on tips    Goals Met:  Independence  with exercise equipment Exercise tolerated well No report of concerns or symptoms today Strength training completed today  Goals Unmet:  Not Applicable  Comments: Pt able to follow exercise prescription today without complaint.  Will continue to monitor for progression.    Dr. Dina Rich is Medical Director for [facility] Card[facility]

## 2022-11-20 MED ORDER — ATORVASTATIN CALCIUM 80 MG PO TABS: 80.0000 mg | ORAL_TABLET | Freq: Every day | ORAL | 3 refills | Status: DC

## 2022-11-20 NOTE — Assessment & Plan Note (Signed)
Has been well-controlled.  Will continue to monitor.  Continue current medications.

## 2022-11-20 NOTE — Assessment & Plan Note (Signed)
LDL returned at 80.  Goal less than 70.  Needs increase in Lipitor to 80 mg daily.

## 2022-11-20 NOTE — Assessment & Plan Note (Addendum)
Doing well with cardiac rehab.  Continue current medications.

## 2022-11-21 ENCOUNTER — Encounter (HOSPITAL_COMMUNITY)
Admission: RE | Admit: 2022-11-21 | Discharge: 2022-11-21 | Disposition: A | Discharge status: Home / Self Care | Payer: Medicare Other | Source: Ambulatory Visit | Attending: Surgery | Admitting: Surgery

## 2022-11-21 DIAGNOSIS — Z951 Presence of aortocoronary bypass graft: Secondary | ICD-10-CM

## 2022-11-21 DIAGNOSIS — Z953 Presence of xenogenic heart valve: Secondary | ICD-10-CM

## 2022-11-21 DIAGNOSIS — I214 Non-ST elevation (NSTEMI) myocardial infarction: Secondary | ICD-10-CM | POA: Diagnosis not present

## 2022-11-21 NOTE — Progress Notes (Signed)
Daily Session Note  Patient Details  Name: Jon Jackson MRN: [id number] Date of Birth: June 24, 1950 Referring Provider:   Flowsheet Row CARDIAC REHAB PHASE II ORIENTATION from 11/12/2022 in [facility] CARDIAC REHABILITATION  Referring Provider Dr. Laneta Simmers       Encounter Date: 11/21/2022  Check In:  Session Check In - 11/21/22 0929       Check-In   Supervising physician immediately available to respond to emergencies See telemetry face sheet for immediately available MD    Location [facility]    Staff Present Ross Ludwig, BS, Exercise Physiologist;Leontyne Manville Juanetta Gosling, MA, RCEP, CCRP, Dow Adolph, RN, BSN    Virtual Visit No    Medication changes reported     No    Fall or balance concerns reported    No    Resistance Training Performed Yes    VAD Patient? No    PAD/SET Patient? No      Pain Assessment   Currently in Pain? No/denies             Capillary Blood Glucose: No results found for this or any previous visit (from the past 24 hour(s)).    Social History   Tobacco Use  Smoking Status Some Days   Current packs/day: 0.25   Average packs/day: 0.3 packs/day for 47.0 years (11.8 ttl pk-yrs)   Types: Cigars, Cigarettes  Smokeless Tobacco Never  Tobacco Comments   Cigars- chews on tips    Goals Met:  Independence with exercise equipment Exercise tolerated well No report of concerns or symptoms today Strength training completed today  Goals Unmet:  Not Applicable  Comments: Pt able to follow exercise prescription today without complaint.  Will continue to monitor for progression.    Dr. Dina Rich is Medical Director for [facility]

## 2022-11-24 ENCOUNTER — Encounter: Payer: Self-pay | Admitting: *Deleted

## 2022-11-24 ENCOUNTER — Encounter (HOSPITAL_COMMUNITY)
Admission: RE | Admit: 2022-11-24 | Discharge: 2022-11-24 | Disposition: A | Discharge status: Home / Self Care | Payer: Medicare Other | Source: Ambulatory Visit | Attending: Surgery | Admitting: Surgery

## 2022-11-24 DIAGNOSIS — Z951 Presence of aortocoronary bypass graft: Secondary | ICD-10-CM

## 2022-11-24 DIAGNOSIS — I214 Non-ST elevation (NSTEMI) myocardial infarction: Secondary | ICD-10-CM | POA: Diagnosis not present

## 2022-11-24 DIAGNOSIS — Z953 Presence of xenogenic heart valve: Secondary | ICD-10-CM | POA: Diagnosis not present

## 2022-11-24 NOTE — Progress Notes (Signed)
Daily Session Note  Patient Details  Name: OVAL LITTREL MRN: [id number] Date of Birth: 26-Mar-1951 Referring Provider:   Flowsheet Row CARDIAC REHAB PHASE II ORIENTATION from 11/12/2022 in [facility] CARDIAC REHABILITATION  Referring Provider Dr. Laneta Simmers       Encounter Date: 11/24/2022  Check In:  Session Check In - 11/24/22 0900       Check-In   Supervising physician immediately available to respond to emergencies See telemetry face sheet for immediately available ER MD    Location [facility]    Staff Present Rodena Medin, RN, BSN;Jessica Juanetta Gosling, MA, RCEP, CCRP, CCET;Heather Fredric Mare, [place], Exercise Physiologist    Virtual Visit No    Medication changes reported     No    Fall or balance concerns reported    No    Warm-up and Cool-down Performed on first and last piece of equipment    Resistance Training Performed Yes    VAD Patient? No    PAD/SET Patient? No      Pain Assessment   Currently in Pain? No/denies    Multiple Pain Sites No             Capillary Blood Glucose: No results found for this or any previous visit (from the past 24 hour(s)).    Social History   Tobacco Use  Smoking Status Some Days   Current packs/day: 0.25   Average packs/day: 0.3 packs/day for 47.0 years (11.8 ttl pk-yrs)   Types: Cigars, Cigarettes  Smokeless Tobacco Never  Tobacco Comments   Cigars- chews on tips    Goals Met:  Independence with exercise equipment Exercise tolerated well No report of concerns or symptoms today Strength training completed today  Goals Unmet:  Not Applicable  Comments: Pt able to follow exercise prescription today without complaint.  Will continue to monitor for progression.    Dr. Dina Rich is Medical Director for [facility] Cardiac Re[facility]

## 2022-11-26 ENCOUNTER — Encounter (HOSPITAL_COMMUNITY)
Admission: RE | Admit: 2022-11-26 | Discharge: 2022-11-26 | Disposition: A | Discharge status: Home / Self Care | Payer: Medicare Other | Source: Ambulatory Visit | Attending: Surgery | Admitting: Surgery

## 2022-11-26 DIAGNOSIS — Z951 Presence of aortocoronary bypass graft: Secondary | ICD-10-CM | POA: Diagnosis not present

## 2022-11-26 DIAGNOSIS — Z953 Presence of xenogenic heart valve: Secondary | ICD-10-CM

## 2022-11-26 DIAGNOSIS — I214 Non-ST elevation (NSTEMI) myocardial infarction: Secondary | ICD-10-CM | POA: Diagnosis not present

## 2022-11-26 NOTE — Progress Notes (Signed)
Daily Session Note  Patient Details  Name: Jon Jackson MRN: [id number] Date of Birth: 07-Apr-1950 Referring Provider:   Flowsheet Row CARDIAC REHAB PHASE II ORIENTATION from 11/12/2022 in [facility] CARDIAC REHABILITATION  Referring Provider Dr. Laneta Simmers       Encounter Date: 11/26/2022  Check In:  Session Check In - 11/26/22 0948       Check-In   Supervising physician immediately available to respond to emergencies See telemetry face sheet for immediately available MD    Location [facility]    Staff Present Rodena Medin, RN, BSN;Bettie Capistran Juanetta Gosling, MA, RCEP, CCRP, CCET;Heather Gaynell Face, Exercise Physiologist;Hillary Leonidas Romberg BSN, RN    Virtual Visit No    Medication changes reported     No    Fall or balance concerns reported    No    Warm-up and Cool-down Performed on first and last piece of equipment    Resistance Training Performed Yes    VAD Patient? No    PAD/SET Patient? No      Pain Assessment   Currently in Pain? No/denies             Capillary Blood Glucose: No results found for this or any previous visit (from the past 24 hour(s)).    Social History   Tobacco Use  Smoking Status Some Days   Current packs/day: 0.25   Average packs/day: 0.3 packs/day for 47.0 years (11.8 ttl pk-yrs)   Types: Cigars, Cigarettes  Smokeless Tobacco Never  Tobacco Comments   Cigars- chews on tips    Goals Met:  Exercise tolerated well No report of concerns or symptoms today Strength training completed today  Goals Unmet:  Not Applicable  Comments: Pt able to follow exercise prescription today without complaint.  Will continue to monitor for progression.    Dr. Dina Rich is Medical Director for [facility] Card[facility] Re[facility]

## 2022-11-28 ENCOUNTER — Encounter (HOSPITAL_COMMUNITY): Payer: Medicare Other

## 2022-12-03 ENCOUNTER — Encounter (HOSPITAL_COMMUNITY)
Admission: RE | Admit: 2022-12-03 | Discharge: 2022-12-03 | Disposition: A | Discharge status: Home / Self Care | Payer: Medicare Other | Source: Ambulatory Visit | Attending: Surgery | Admitting: Surgery

## 2022-12-03 DIAGNOSIS — I214 Non-ST elevation (NSTEMI) myocardial infarction: Secondary | ICD-10-CM | POA: Insufficient documentation

## 2022-12-03 DIAGNOSIS — Z953 Presence of xenogenic heart valve: Secondary | ICD-10-CM | POA: Insufficient documentation

## 2022-12-03 DIAGNOSIS — Z951 Presence of aortocoronary bypass graft: Secondary | ICD-10-CM | POA: Insufficient documentation

## 2022-12-03 NOTE — Progress Notes (Signed)
Daily Session Note  Patient Details  Name: Jon Jackson MRN: [id number] Date of Birth: April 27, 1950 Referring Provider:   Flowsheet Row CARDIAC REHAB PHASE II ORIENTATION from 11/12/2022 in [facility] CARDIAC REHABILITATION  Referring Provider Dr. Laneta Simmers       Encounter Date: 12/03/2022  Check In:  Session Check In - 12/03/22 0815       Check-In   Supervising physician immediately available to respond to emergencies See telemetry face sheet for immediately available MD    Location [facility]    Staff Present Ross Ludwig, BS, Exercise Physiologist;Tequita Marrs Daphine Deutscher, RN, BSN;Jessica Hawkins, MA, RCEP, CCRP, CCET    Virtual Visit No    Medication changes reported     No    Fall or balance concerns reported    No    Tobacco Cessation No Change    Warm-up and Cool-down Performed on first and last piece of equipment    Resistance Training Performed Yes    VAD Patient? No      Pain Assessment   Currently in Pain? No/denies             Capillary Blood Glucose: No results found for this or any previous visit (from the past 24 hour(s)).    Social History   Tobacco Use  Smoking Status Some Days   Current packs/day: 0.25   Average packs/day: 0.3 packs/day for 47.0 years (11.8 ttl pk-yrs)   Types: Cigars, Cigarettes  Smokeless Tobacco Never  Tobacco Comments   Cigars- chews on tips    Goals Met:  Independence with exercise equipment Exercise tolerated well No report of concerns or symptoms today Strength training completed today  Goals Unmet:  Not Applicable  Comments: Pt able to follow exercise prescription today without complaint.  Will continue to monitor for progression.    Dr. Dina Rich is Medical Director for [facility] Cardiac Rehab

## 2022-12-05 ENCOUNTER — Encounter (HOSPITAL_COMMUNITY): Admission: RE | Admit: 2022-12-05 | Payer: Medicare Other | Source: Ambulatory Visit

## 2022-12-08 ENCOUNTER — Encounter (HOSPITAL_COMMUNITY): Payer: Medicare Other

## 2022-12-10 ENCOUNTER — Encounter (HOSPITAL_COMMUNITY): Payer: Medicare Other

## 2022-12-10 ENCOUNTER — Encounter (HOSPITAL_COMMUNITY): Payer: Self-pay | Admitting: *Deleted

## 2022-12-10 DIAGNOSIS — I214 Non-ST elevation (NSTEMI) myocardial infarction: Secondary | ICD-10-CM

## 2022-12-10 DIAGNOSIS — Z953 Presence of xenogenic heart valve: Secondary | ICD-10-CM

## 2022-12-10 DIAGNOSIS — Z951 Presence of aortocoronary bypass graft: Secondary | ICD-10-CM

## 2022-12-10 NOTE — Progress Notes (Signed)
Cardiac Individual Treatment Plan  Patient Details  Name: Jon Jackson MRN: [id number] Date of Birth: [birth year] Referring Provider:   Flowsheet Row CARDIAC REHAB PHASE II ORIENTATION from 11/12/2022 in [facility] CARDIAC REHABILITATION  Referring Provider Dr. Laneta Simmers       Initial Encounter Date:  Flowsheet Row CARDIAC REHAB PHASE II ORIENTATION from 11/12/2022 in [place] CARDIAC REHABILITATION  Date 11/12/22       Visit Diagnosis: NSTEMI (non-ST elevated myocardial infarction) (HCC)  S/P CABG x 1  S/P aortic valve replacement with bioprosthetic valve  Patient's Home Medications on Admission:  Current Outpatient Medications:    aspirin EC 325 MG tablet, Take 1 tablet (325 mg total) by mouth daily. (Patient taking differently: Take 81 mg by mouth daily.), Disp: , Rfl:    atorvastatin (LIPITOR) 80 MG tablet, Take 1 tablet (80 mg total) by mouth daily., Disp: 90 tablet, Rfl: 3   citalopram (CELEXA) 20 MG tablet, Take 1 tablet (20 mg total) by mouth daily., Disp: 90 tablet, Rfl: 1   Fe Fum-Vit C-Vit B12-FA (TRIGELS-F FORTE) CAPS capsule, Take 1 capsule by mouth daily after breakfast., Disp: 30 capsule, Rfl: 2   losartan (COZAAR) 50 MG tablet, Take 1 tablet (50 mg total) by mouth daily., Disp: 90 tablet, Rfl: 1   Multiple Vitamin (MULTIVITAMIN) tablet, Take 1 tablet by mouth daily., Disp: , Rfl:    omeprazole (PRILOSEC) 40 MG capsule, Take 1 capsule (40 mg total) by mouth daily., Disp: 90 capsule, Rfl: 1  Past Medical History: Past Medical History:  Diagnosis Date   Anemia    as a child   Anxiety    Arthritis    Atrial fibrillation (HCC)    Basal cell carcinoma    head   Coronary artery disease    Depression    Dysrhythmia    GERD (gastroesophageal reflux disease)    Heart murmur    Hyperlipidemia    Myocardial infarction (HCC) 10/02/2022   Pneumonia 02/2022   Squamous carcinoma     Tobacco Use: Social History   Tobacco Use  Smoking Status Some Days    Current packs/day: 0.25   Average packs/day: 0.3 packs/day for 47.0 years (11.8 ttl pk-yrs)   Types: Cigars, Cigarettes  Smokeless Tobacco Never  Tobacco Comments   Cigars- chews on tips    Labs: Review Flowsheet  More data exists      Latest Ref Rng & Units 07/10/2022 10/03/2022 10/09/2022 10/10/2022 11/18/2022  Labs for ITP Cardiac and Pulmonary Rehab  Cholestrol 100 - 199 mg/dL 627  035  - - 009   LDL (calc) 0 - 99 mg/dL 381  829  - - 80   HDL-C >39 mg/dL 50  47  - - 45   Trlycerides 0 - 149 mg/dL 90  75  - - 937   Hemoglobin A1c 4.8 - 5.6 % - - 5.2  - -  PH, Arterial 7.35 - 7.45 - 7.393  7.41  7.424  7.313  7.253  7.327  7.390  7.355  7.303  -  PCO2 arterial 32 - 48 mmHg - 36.6  38  29.8  46.0  52.1  45.9  40.1  42.5  51.8  -  Bicarbonate 20.0 - 28.0 mmol/L - 22.3  24.9  25.0  24.1  19.7  23.3  23.0  24.0  24.2  31.1  23.8  25.7  -  TCO2 22 - 32 mmol/L - 23  26  26   - 21  25  25  25  26  26  25  27   33  25  28  27  28   -  Acid-base deficit 0.0 - 2.0 mmol/L - 2.0  1.0  1.0  0.3  4.0  3.0  5.0  2.0  1.0  2.0  1.0  -  O2 Saturation % - 95  73  76  99.4  99  99  96  100  100  81  100  100  -    Details       Multiple values from one day are sorted in reverse-chronological order         Capillary Blood Glucose: Lab Results  Component Value Date   GLUCAP 106 (H) 10/14/2022   GLUCAP 112 (H) 10/13/2022   GLUCAP 106 (H) 10/13/2022   GLUCAP 85 10/13/2022   GLUCAP 90 10/13/2022     Exercise Target Goals: Exercise Program Goal: Individual exercise prescription set using results from initial 6 min walk test and THRR while considering  patient's activity barriers and safety.   Exercise Prescription Goal: Starting with aerobic activity 30 plus minutes a day, 3 days per week for initial exercise prescription. Provide home exercise prescription and guidelines that participant acknowledges understanding prior to discharge.  Activity Barriers & Risk Stratification:  Activity Barriers  & Cardiac Risk Stratification - 11/12/22 1413       Activity Barriers & Cardiac Risk Stratification   Activity Barriers Other (comment)    Comments Patient has limited mobility in his left arm due to old shoulder injury repaired with surgery.    Cardiac Risk Stratification High             6 Minute Walk:  6 Minute Walk     Row Name 11/12/22 1603         6 Minute Walk   Phase Initial     Distance 1600 feet     Walk Time 6 minutes     # of Rest Breaks 0     MPH 3.03     METS 3.11     RPE 11     VO2 Peak 10.9     Symptoms No     Resting HR 63 bpm     Resting BP 160/70     Resting Oxygen Saturation  96 %     Exercise Oxygen Saturation  during 6 min walk 96 %     Max Ex. HR 77 bpm     Max Ex. BP 156/68     2 Minute Post BP 146/64              Oxygen Initial Assessment:   Oxygen Re-Evaluation:   Oxygen Discharge (Final Oxygen Re-Evaluation):   Initial Exercise Prescription:  Initial Exercise Prescription - 11/12/22 1600       Date of Initial Exercise RX and Referring Provider   Date 11/12/22    Referring Provider Dr. Laneta Simmers      Oxygen   Maintain Oxygen Saturation 88% or higher      Treadmill   MPH 2    Grade 0.5    Minutes 15      REL-XR   Level 2    Speed 50    Minutes 15      Prescription Details   Frequency (times per week) 3    Duration Progress to 30 minutes of continuous aerobic without signs/symptoms of physical distress      Intensity   THRR 40-80% of Max  Heartrate 97-131    Ratings of Perceived Exertion 11-13      Resistance Training   Training Prescription Yes    Weight 4    Reps 10-15             Perform Capillary Blood Glucose checks as needed.  Exercise Prescription Changes:   Exercise Prescription Changes     Row Name 12/03/22 1200             Response to Exercise   Blood Pressure (Admit) 132/72       Blood Pressure (Exercise) 134/62       Blood Pressure (Exit) 128/70       Heart Rate (Admit) 58 bpm        Heart Rate (Exercise) 101 bpm       Heart Rate (Exit) 64 bpm       Rating of Perceived Exertion (Exercise) 13       Duration Continue with 30 min of aerobic exercise without signs/symptoms of physical distress.       Intensity THRR unchanged         Progression   Progression Continue to progress workloads to maintain intensity without signs/symptoms of physical distress.         Resistance Training   Training Prescription Yes       Weight 5       Reps 10-15         Treadmill   MPH 2.5       Grade 0.5       Minutes 15       METs 3.09         NuStep   Level 3       SPM 75       Minutes 15       METs 1.9         Oxygen   Maintain Oxygen Saturation 88% or higher                Exercise Comments:   Exercise Goals and Review:   Exercise Goals     Row Name 11/12/22 1606             Exercise Goals   Increase Physical Activity Yes       Intervention Provide advice, education, support and counseling about physical activity/exercise needs.;Develop an individualized exercise prescription for aerobic and resistive training based on initial evaluation findings, risk stratification, comorbidities and participant's personal goals.       Expected Outcomes Short Term: Attend rehab on a regular basis to increase amount of physical activity.;Long Term: Exercising regularly at least 3-5 days a week.;Long Term: Add in home exercise to make exercise part of routine and to increase amount of physical activity.       Increase Strength and Stamina Yes       Intervention Provide advice, education, support and counseling about physical activity/exercise needs.;Develop an individualized exercise prescription for aerobic and resistive training based on initial evaluation findings, risk stratification, comorbidities and participant's personal goals.       Expected Outcomes Short Term: Increase workloads from initial exercise prescription for resistance, speed, and METs.;Short Term:  Perform resistance training exercises routinely during rehab and add in resistance training at home;Long Term: Improve cardiorespiratory fitness, muscular endurance and strength as measured by increased METs and functional capacity ( )       Able to understand and use rate of perceived exertion (RPE) scale Yes       Intervention Provide  education and explanation on how to use RPE scale       Expected Outcomes Short Term: Able to use RPE daily in rehab to express subjective intensity level;Long Term:  Able to use RPE to guide intensity level when exercising independently       Knowledge and understanding of Target Heart Rate Range (THRR) Yes       Intervention Provide education and explanation of THRR including how the numbers were predicted and where they are located for reference       Expected Outcomes Long Term: Able to use THRR to govern intensity when exercising independently;Short Term: Able to state/look up THRR;Short Term: Able to use daily as guideline for intensity in rehab       Able to check pulse independently Yes       Intervention Provide education and demonstration on how to check pulse in carotid and radial arteries.;Review the importance of being able to check your own pulse for safety during independent exercise       Expected Outcomes Short Term: Able to explain why pulse checking is important during independent exercise;Long Term: Able to check pulse independently and accurately       Understanding of Exercise Prescription Yes       Intervention Provide education, explanation, and written materials on patient's individual exercise prescription       Expected Outcomes Short Term: Able to explain program exercise prescription;Long Term: Able to explain home exercise prescription to exercise independently                Exercise Goals Re-Evaluation :  Exercise Goals Re-Evaluation     Row Name 11/26/22 1008             Exercise Goal Re-Evaluation   Exercise Goals  Review Increase Physical Activity;Increase Strength and Stamina;Able to understand and use rate of perceived exertion (RPE) scale;Knowledge and understanding of Target Heart Rate Range (THRR);Understanding of Exercise Prescription       Comments Patient says he feels he is doing very well in the program. He reports he feels he is getting stronger and his stamina is improving. 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 He walks his dogs on trails and does a lot of walking taking care of his farm and animals. He is progressing on the equipment.       Expected Outcomes Short Term: Patient will continu to work hard during sessions and continue to walk at home. Long Term: Patient will continue to exercise at home and continue to be active.                 Discharge Exercise Prescription (Final Exercise Prescription Changes):  Exercise Prescription Changes - 12/03/22 1200       Response to Exercise   Blood Pressure (Admit) 132/72    Blood Pressure (Exercise) 134/62    Blood Pressure (Exit) 128/70    Heart Rate (Admit) 58 bpm    Heart Rate (Exercise) 101 bpm    Heart Rate (Exit) 64 bpm    Rating of Perceived Exertion (Exercise) 13    Duration Continue with 30 min of aerobic exercise without signs/symptoms of physical distress.    Intensity THRR unchanged      Progression   Progression Continue to progress workloads to maintain intensity without signs/symptoms of physical distress.      Resistance Training   Training Prescription Yes  Weight 5    Reps 10-15      Treadmill   MPH 2.5    Grade 0.5    Minutes 15    METs 3.09      NuStep   Level 3    SPM 75    Minutes 15    METs 1.9      Oxygen   Maintain Oxygen Saturation 88% or higher             Nutrition:  Target Goals: Understanding of nutrition guidelines, daily intake of sodium 1500mg , cholesterol 200mg , calories 30% from fat and 7% or less  from saturated fats, daily to have 5 or more servings of fruits and vegetables.  Biometrics:  Pre Biometrics - 11/12/22 1607       Pre Biometrics   Height 5\' 10"  (1.778 m)    Weight 210 lb 1.6 oz (95.3 kg)    Waist Circumference 42 inches    Hip Circumference 41 inches    Waist to Hip Ratio 1.02 %    BMI (Calculated) 30.15    Grip Strength 22.9 kg    Flexibility 0 in    Single Leg Stand 21.64 seconds              Nutrition Therapy Plan and Nutrition Goals:   Nutrition Assessments:  MEDIFICTS Score Key: >=70 Need to make dietary changes  40-70 Heart Healthy Diet <= 40 Therapeutic Level Cholesterol Diet  Flowsheet Row CARDIAC REHAB PHASE II EXERCISE from 11/17/2022 in [facility] CARDIAC REHABILITATION  Picture Your Plate Total Score on Admission 55      Picture Your Plate Scores: <62 Unhealthy dietary pattern with much room for improvement. 41-50 Dietary pattern unlikely to meet recommendations for good health and room for improvement. 51-60 More healthful dietary pattern, with some room for improvement.  >60 Healthy dietary pattern, although there may be some specific behaviors that could be improved.    Nutrition Goals Re-Evaluation:  Nutrition Goals Re-Evaluation     Row Name 11/26/22 1004             Goals   Current Weight 207 lb (93.9 kg)       Nutrition Goal Patient wants to continue to work on eating less fatty food and work on portion control.       Comment Patient says he knows he needs to work on his diet. He is trying to eat more fish like tuna for lunch and cut back on pork and beef. He wants to remain below 210 lbs.       Expected Outcome Short Term: continue to work on eating less fatty food and portion control. Long Term: Keep his weight under 210 lbs.                Nutrition Goals Discharge (Final Nutrition Goals Re-Evaluation):  Nutrition Goals Re-Evaluation - 11/26/22 1004       Goals   Current Weight 207 lb (93.9 kg)     Nutrition Goal Patient wants to continue to work on eating less fatty food and work on portion control.    Comment Patient says he knows he needs to work on his diet. He is trying to eat more fish like tuna for lunch and cut back on pork and beef. He wants to remain below 210 lbs.    Expected Outcome Short Term: continue to work on eating less fatty food and portion control. Long Term: Keep his weight under 210 lbs.  Psychosocial: Target Goals: Acknowledge presence or absence of significant depression and/or stress, maximize coping skills, provide positive support system. Participant is able to verbalize types and ability to use techniques and skills needed for reducing stress and depression.  Initial Review & Psychosocial Screening:  Initial Psych Review & Screening - 11/12/22 1506       Initial Review   Current issues with History of Depression      Family Dynamics   Good Support System? Yes    Comments Patient's wife and 4 chidlren are his support.      Barriers   Psychosocial barriers to participate in program There are no identifiable barriers or psychosocial needs.      Screening Interventions   Interventions Encouraged to exercise;Provide feedback about the scores to participant    Expected Outcomes Short Term goal: Utilizing psychosocial counselor, staff and physician to assist with identification of specific Stressors or current issues interfering with healing process. Setting desired goal for each stressor or current issue identified.;Long Term Goal: Stressors or current issues are controlled or eliminated.;Short Term goal: Identification and review with participant of any Quality of Life or Depression concerns found by scoring the questionnaire.;Long Term goal: The participant improves quality of Life and PHQ9 Scores as seen by post scores and/or verbalization of changes             Quality of Life Scores:  Quality of Life - 11/12/22 1608       Quality  of Life   Select Quality of Life      Quality of Life Scores   Health/Function Pre 19.39 %    Socioeconomic Pre 21.64 %    Psych/Spiritual Pre 20.86 %    Family Pre 27.6 %    GLOBAL Pre 21.42 %            Scores of 19 and below usually indicate a poorer quality of life in these areas.  A difference of  2-3 points is a clinically meaningful difference.  A difference of 2-3 points in the total score of the Quality of Life Index has been associated with significant improvement in overall quality of life, self-image, physical symptoms, and general health in studies assessing change in quality of life.  PHQ-9: Review Flowsheet  More data exists      11/18/2022 11/12/2022 10/17/2022 07/10/2022 05/09/2022  Depression screen PHQ 2/9  Decreased Interest 1 0 1 1 0  Down, Depressed, Hopeless 0 0 0 0 0  PHQ - 2 Score 1 0 1 1 0  Altered sleeping 0 0 0 0 -  Tired, decreased energy 1 1 1 1  -  Change in appetite 0 0 0 0 -  Feeling bad or failure about yourself  0 0 0 0 -  Trouble concentrating 0 1 1 0 -  Moving slowly or fidgety/restless 0 0 0 0 -  Suicidal thoughts 0 0 0 0 -  PHQ-9 Score 2 2 3 2  -  Difficult doing work/chores - Not difficult at all Not difficult at all Somewhat difficult -    Details           Interpretation of Total Score  Total Score Depression Severity:  1-4 = Minimal depression, 5-9 = Mild depression, 10-14 = Moderate depression, 15-19 = Moderately severe depression, 20-27 = Severe depression   Psychosocial Evaluation and Intervention:  Psychosocial Evaluation - 11/12/22 1507       Psychosocial Evaluation & Interventions   Interventions Relaxation education;Stress management education;Encouraged to exercise with  the program and follow exercise prescription    Comments Patient referred to cardiac rehab with S/P Aortic Valve Replacement; CABGx1 and NSTEMI. His PHQ-9 score was 2 due to lack of energy and trouble concentrating at times. He does have a history of  depression and is currently on Celexa. He says he has been taking this medication for 10+ years and feels it manages his depression well. He denies any anxiety or significant stressors in his life. He quit smoking during his hospitalization 10/02/22. He was using nicotine patches but says he quit using. He says he is able to deal with his urges to smoke. He says these episodes are worse if he is around someone smoking so he tries to avoid this. No one in his family smokes. He and his son-in-law quit at the same time. He reports sleeping well. He is retired from [employer]. 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 He has a lot of farm animals to care for and he says he really enjoys being outside caring for his animals and taking care of his farm. He is currently not exercising but says he does do a lot of walking on his farm. His goals for the program are to improve his stamina and to feel better overall and be able to get back to his normal activities. He has no barriers identified to participate in the program.    Expected Outcomes Short Term: attend the program consistently; Long Term: Improve his stamina and feel better.    Continue Psychosocial Services  Follow up required by staff             Psychosocial Re-Evaluation:  Psychosocial Re-Evaluation     Row Name 11/26/22 1103             Psychosocial Re-Evaluation   Current issues with Current Depression;Current Psychotropic Meds       Comments Patient is doing very well in the program. He says he feels like he is making a lot of progress and is meeting his goals. His strength and stamina are improving and he is able to do all the activities he wants to do without difficulty. He continues to remain tobacco free. He says he still has urges at times but these are decreasing. He is sleeping well and reports no stress concerns. He continue to take celexa to manage his  depression and feels it is managed well. We will continue to monitor his progress.       Expected Outcomes Short Term Goal: Continue to attend sessions and get stronger and abstain from smoking. Long Term: Continue to abstain from smoking and continue to keep his depression mananged.       Interventions Encouraged to attend Cardiac Rehabilitation for the exercise;Relaxation education;Stress management education       Continue Psychosocial Services  Follow up required by staff                Psychosocial Discharge (Final Psychosocial Re-Evaluation):  Psychosocial Re-Evaluation - 11/26/22 1103       Psychosocial Re-Evaluation   Current issues with Current Depression;Current Psychotropic Meds    Comments Patient is doing very well in the program. He says he feels like he is making a lot of progress and is meeting his goals. His strength and stamina are improving and he is able to do all the activities he wants to do without difficulty. He continues  to remain tobacco free. He says he still has urges at times but these are decreasing. He is sleeping well and reports no stress concerns. He continue to take celexa to manage his depression and feels it is managed well. We will continue to monitor his progress.    Expected Outcomes Short Term Goal: Continue to attend sessions and get stronger and abstain from smoking. Long Term: Continue to abstain from smoking and continue to keep his depression mananged.    Interventions Encouraged to attend Cardiac Rehabilitation for the exercise;Relaxation education;Stress management education    Continue Psychosocial Services  Follow up required by staff             Vocational Rehabilitation: Provide vocational rehab assistance to qualifying candidates.   Vocational Rehab Evaluation & Intervention:  Vocational Rehab - 11/12/22 1506       Initial Vocational Rehab Evaluation & Intervention   Assessment shows need for Vocational Rehabilitation No       Vocational Rehab Re-Evaulation   Comments Patient is retired.             Education: Education Goals: Education classes will be provided on a weekly basis, covering required topics. Participant will state understanding/return demonstration of topics presented.  Learning Barriers/Preferences:  Learning Barriers/Preferences - 11/12/22 1505       Learning Barriers/Preferences   Learning Barriers None    Learning Preferences Audio             Education Topics: Hypertension, Hypertension Reduction -Define heart disease and high blood pressure. Discus how high blood pressure affects the body and ways to reduce high blood pressure.   Exercise and Your Heart -Discuss why it is important to exercise, the FITT principles of exercise, normal and abnormal responses to exercise, and how to exercise safely.   Angina -Discuss definition of angina, causes of angina, treatment of angina, and how to decrease risk of having angina.   Cardiac Medications -Review what the following cardiac medications are used for, how they affect the body, and side effects that may occur when taking the medications.  Medications include Aspirin, Beta blockers, calcium channel blockers, ACE Inhibitors, angiotensin receptor blockers, diuretics, digoxin, and antihyperlipidemics. Flowsheet Row CARDIAC REHAB PHASE II EXERCISE from 11/26/2022 in [place] CARDIAC REHABILITATION  Date 11/26/22  Educator DJ  Instruction Review Code 1- Verbalizes Understanding       Congestive Heart Failure -Discuss the definition of CHF, how to live with CHF, the signs and symptoms of CHF, and how keep track of weight and sodium intake.   Heart Disease and Intimacy -Discus the effect sexual activity has on the heart, how changes occur during intimacy as we age, and safety during sexual activity.   Smoking Cessation / COPD -Discuss different methods to quit smoking, the health benefits of quitting smoking, and the  definition of COPD. Flowsheet Row CARDIAC REHAB PHASE II EXERCISE from 12/03/2022 in [place] CARDIAC REHABILITATION  Date 12/03/22  Educator [facility]  Instruction Review Code 2- Demonstrated Understanding       Nutrition I: Fats -Discuss the types of cholesterol, what cholesterol does to the heart, and how cholesterol levels can be controlled.   Nutrition II: Labels -Discuss the different components of food labels and how to read food label   Heart Parts/Heart Disease and PAD -Discuss the anatomy of the heart, the pathway of blood circulation through the heart, and these are affected by heart disease.   Stress I: Signs and Symptoms -Discuss the causes of stress,  how stress may lead to anxiety and depression, and ways to limit stress. Flowsheet Row CARDIAC REHAB PHASE II EXERCISE from 11/26/2022 in [place] CARDIAC REHABILITATION  Date 11/19/22  Educator [facility]  Instruction Review Code 1- Verbalizes Understanding       Stress II: Relaxation -Discuss different types of relaxation techniques to limit stress. Flowsheet Row CARDIAC REHAB PHASE II EXERCISE from 11/26/2022 in [place] CARDIAC REHABILITATION  Date 11/19/22  Educator [facility]  Instruction Review Code 1- Verbalizes Understanding       Warning Signs of Stroke / TIA -Discuss definition of a stroke, what the signs and symptoms are of a stroke, and how to identify when someone is having stroke.   Knowledge Questionnaire Score:  Knowledge Questionnaire Score - 11/19/22 1040       Knowledge Questionnaire Score   Pre Score 20/24             Core Components/Risk Factors/Patient Goals at Admission:  Personal Goals and Risk Factors at Admission - 11/12/22 1506       Core Components/Risk Factors/Patient Goals on Admission    Weight Management Weight Maintenance    Hypertension Yes    Intervention Provide education on lifestyle modifcations including regular physical activity/exercise, weight management, moderate sodium  restriction and increased consumption of fresh fruit, vegetables, and low fat dairy, alcohol moderation, and smoking cessation.;Monitor prescription use compliance.    Expected Outcomes Short Term: Continued assessment and intervention until BP is < 140/24mm HG in hypertensive participants. < 130/27mm HG in hypertensive participants with diabetes, heart failure or chronic kidney disease.;Long Term: Maintenance of blood pressure at goal levels.    Lipids Yes    Intervention Provide education and support for participant on nutrition & aerobic/resistive exercise along with prescribed medications to achieve LDL 70mg , HDL >40mg .    Expected Outcomes Short Term: Participant states understanding of desired cholesterol values and is compliant with medications prescribed. Participant is following exercise prescription and nutrition guidelines.;Long Term: Cholesterol controlled with medications as prescribed, with individualized exercise RX and with personalized nutrition plan. Value goals: LDL < 70mg , HDL > 40 mg.             Core Components/Risk Factors/Patient Goals Review:   Goals and Risk Factor Review     Row Name 11/26/22 0956             Core Components/Risk Factors/Patient Goals Review   Personal Goals Review Weight Management/Obesity;Hypertension;Lipids       Review Patient is doing well in the program with consistent attendance. His goal for his weight is to remain under 210 lbs. He is currently at 207 lbs so he is pleased with this. He is trying to watch his diet and portion control. He says he feels great and is able to do all he wants to do. His blood pressure is above goal averaging 140 to 150 systolic. He says he does check his blood pressure at home. He says he usually gets 150/60. He says he is taking his medications as prescribed and he takes his blood pressure medication at night because they make him drowsy. I told him we were going to notiffy his cardiologist of his readings. He  requested we reach out to Robin Searing, FNP as this is the provider he has seen the most. We will continue to monitor his progress in the program.       Expected Outcomes Short Term: Continue to monitor b/p at home. Long term: Continue to be compliant with his  medications and with weight management.                Core Components/Risk Factors/Patient Goals at Discharge (Final Review):   Goals and Risk Factor Review - 11/26/22 0956       Core Components/Risk Factors/Patient Goals Review   Personal Goals Review Weight Management/Obesity;Hypertension;Lipids    Review Patient is doing well in the program with consistent attendance. His goal for his weight is to remain under 210 lbs. He is currently at 207 lbs so he is pleased with this. He is trying to watch his diet and portion control. He says he feels great and is able to do all he wants to do. His blood pressure is above goal averaging 140 to 150 systolic. He says he does check his blood pressure at home. He says he usually gets 150/60. He says he is taking his medications as prescribed and he takes his blood pressure medication at night because they make him drowsy. I told him we were going to notiffy his cardiologist of his readings. He requested we reach out to Robin Searing, FNP as this is the provider he has seen the most. We will continue to monitor his progress in the program.    Expected Outcomes Short Term: Continue to monitor b/p at home. Long term: Continue to be compliant with his medications and with weight management.             ITP Comments:  ITP Comments     Row Name 11/12/22 1611 12/10/22 0754         ITP Comments Patient attend orientation today.  Patient is attendingCardiac Rehabilitation Program.  Documentation for diagnosis can be found in [facility] 10/10/22.  Reviewed medical chart, RPE/RPD, gym safety, and program guidelines.  Patient was fitted to equipment they will be using during rehab.  Patient is scheduled to start  exercise on 11/17/22 at 915.   Initial ITP created and sent for review and signature by Dr. Dina Rich, Medical Director for Cardiac Rehabilitation Program. 30 day review completed. ITP sent to Dr. Dina Rich, Medical Director of Cardiac Rehab. Continue with ITP unless changes are made by physician.   Pt called out yesterday, exposed to COVID on Monday, c/o headache, encouraged to test and keep [place] posted.               Comments: 30 day review

## 2022-12-12 ENCOUNTER — Encounter (HOSPITAL_COMMUNITY): Payer: Medicare Other

## 2022-12-15 ENCOUNTER — Encounter (HOSPITAL_COMMUNITY)
Admission: RE | Admit: 2022-12-15 | Discharge: 2022-12-15 | Disposition: A | Discharge status: Home / Self Care | Payer: Medicare Other | Source: Ambulatory Visit | Attending: Surgery

## 2022-12-15 DIAGNOSIS — I214 Non-ST elevation (NSTEMI) myocardial infarction: Secondary | ICD-10-CM

## 2022-12-15 DIAGNOSIS — Z951 Presence of aortocoronary bypass graft: Secondary | ICD-10-CM | POA: Diagnosis not present

## 2022-12-15 DIAGNOSIS — Z953 Presence of xenogenic heart valve: Secondary | ICD-10-CM

## 2022-12-15 NOTE — Progress Notes (Signed)
Daily Session Note  Patient Details  Name: Jon Jackson MRN: [id number] Date of Birth: 1950/06/25 Referring Provider:   Flowsheet Row CARDIAC REHAB PHASE II ORIENTATION from 11/12/2022 in [facility] CARDIAC REHABILITATION  Referring Provider Dr. Laneta Simmers       Encounter Date: 12/15/2022  Check In:  Session Check In - 12/15/22 0900       Check-In   Supervising physician immediately available to respond to emergencies See telemetry face sheet for immediately available ER MD    Location [facility]-Card[facility]    Staff Present Ross Ludwig, BS, Exercise Physiologist;Jessica Juanetta Gosling, MA, RCEP, CCRP, Dow Adolph, RN, BSN    Virtual Visit No    Medication changes reported     No    Fall or balance concerns reported    No    Warm-up and Cool-down Performed on first and last piece of equipment    Resistance Training Performed Yes    VAD Patient? No    PAD/SET Patient? No      Pain Assessment   Currently in Pain? No/denies    Multiple Pain Sites No             Capillary Blood Glucose: No results found for this or any previous visit (from the past 24 hour(s)).    Social History   Tobacco Use  Smoking Status Some Days   Current packs/day: 0.25   Average packs/day: 0.3 packs/day for 47.0 years (11.8 ttl pk-yrs)   Types: Cigars, Cigarettes  Smokeless Tobacco Never  Tobacco Comments   Cigars- chews on tips    Goals Met:  Independence with exercise equipment Exercise tolerated well No report of concerns or symptoms today Strength training completed today  Goals Unmet:  Not Applicable  Comments: Pt able to follow exercise prescription today without complaint.  Will continue to monitor for progression.    Dr. Dina Rich is Medical Director for [facility] Cardiac Rehab

## 2022-12-17 ENCOUNTER — Encounter (HOSPITAL_COMMUNITY): Payer: Medicare Other

## 2022-12-18 NOTE — Progress Notes (Deleted)
Electrophysiology Office Note:   Date:  12/18/2022  ID:  FRANKEY WENDLAND, DOB 11-27-1950, MRN [id number]  Primary Cardiologist: None Electrophysiologist: Will Jorja Loa, MD  {Click to update primary MD,subspecialty MD or APP then REFRESH:1}    History of Present Illness:   Jon Jackson is a 72 y.o. male with h/o *** seen today for routine electrophysiology followup.     Since last being seen in our clinic the patient reports doing ***.  he denies chest pain, palpitations, dyspnea, PND, orthopnea, nausea, vomiting, dizziness, syncope, edema, weight gain, or early satiety.   Review of systems complete and found to be negative unless listed in HPI.   EP Information / Studies Reviewed:    {EKGtoday:28818}      Studies ***   Risk Assessment/Calculations:   {Does this patient have ATRIAL FIBRILLATION?:248 [id number]} No BP recorded.  {Refresh Note OR Click here to enter BP  :1}***        Physical Exam:   VS:  There were no vitals taken for this visit.   Wt Readings from Last 3 Encounters:  11/18/22 207 lb 9.6 oz (94.2 kg)  11/12/22 210 lb 1.6 oz (95.3 kg)  11/10/22 207 lb (93.9 kg)     GEN: Well nourished, well developed in no acute distress NECK: No JVD; No carotid bruits CARDIAC: {EPRHYTHM:28826}, no murmurs, rubs, gallops RESPIRATORY:  Clear to auscultation without rales, wheezing or rhonchi  ABDOMEN: Soft, non-tender, non-distended EXTREMITIES:  No edema; No deformity   ASSESSMENT AND PLAN:    Atrial Fibrillation  CHA2DS2-VASc *** -***    HTN  -controlled on current regimen ***   CAD  HLD  -continue ASA, lipitor***  VHD s/p Bioprosthetic AVR -***    Follow up with {OVFIE:33295} {EPFOLLOW JO:84166}  Signed, Canary Brim, MSN, APRN, NP-C, AGACNP-BC [facility] - Electrophysiology  12/18/2022, 10:55 AM

## 2022-12-19 ENCOUNTER — Ambulatory Visit: Payer: Medicare Other | Attending: Student | Admitting: Student

## 2022-12-19 ENCOUNTER — Encounter (HOSPITAL_COMMUNITY): Payer: Medicare Other

## 2022-12-19 ENCOUNTER — Encounter: Payer: Self-pay | Admitting: Student

## 2022-12-19 VITALS — BP 130/74 | HR 70 | Ht 70.0 in | Wt 211.6 lb

## 2022-12-19 DIAGNOSIS — I10 Essential (primary) hypertension: Secondary | ICD-10-CM | POA: Insufficient documentation

## 2022-12-19 DIAGNOSIS — Z953 Presence of xenogenic heart valve: Secondary | ICD-10-CM | POA: Diagnosis not present

## 2022-12-19 DIAGNOSIS — I2510 Atherosclerotic heart disease of native coronary artery without angina pectoris: Secondary | ICD-10-CM | POA: Insufficient documentation

## 2022-12-19 DIAGNOSIS — E785 Hyperlipidemia, unspecified: Secondary | ICD-10-CM | POA: Diagnosis not present

## 2022-12-19 DIAGNOSIS — I4891 Unspecified atrial fibrillation: Secondary | ICD-10-CM | POA: Insufficient documentation

## 2022-12-19 NOTE — Progress Notes (Signed)
Electrophysiology Office Note:   Date:  12/19/2022  ID:  SHEMUEL Jackson, DOB 05-16-1950, MRN [id number]  Primary Cardiologist: None Electrophysiologist: Will Jorja Loa, MD      History of Present Illness:   Jon Jackson is a 72 y.o. male with h/o AF, CAD s/p CABG, AS s/p AVR, HTN, and HLD  seen today for routine electrophysiology followup.   Since last being seen in our clinic the patient reports doing very well having undergone CABG and AVR this summer.  he denies chest pain, palpitations, PND, orthopnea, nausea, vomiting, dizziness, syncope, edema, weight gain, or early satiety.   Mild SOB with activity, but working with cardiac rehab.   Review of systems complete and found to be negative unless listed in HPI.   EP Information / Studies Reviewed:    EKG is not ordered today. EKG from 10/11/2022 reviewed which showed NSR at 97 bpm       Arrhythmia History  S/p AF ablation 06/2016  Physical Exam:   VS:  BP 130/74 (BP Location: Left Arm, Patient Position: Sitting, Cuff Size: Large)   Pulse 70   Ht 5\' 10"  (1.778 m)   Wt 211 lb 9.6 oz (96 kg)   SpO2 96%   BMI 30.36 kg/m    Wt Readings from Last 3 Encounters:  12/19/22 211 lb 9.6 oz (96 kg)  11/18/22 207 lb 9.6 oz (94.2 kg)  11/12/22 210 lb 1.6 oz (95.3 kg)     GEN: Well nourished, well developed in no acute distress NECK: No JVD; No carotid bruits CARDIAC: Regular rate and rhythm, no murmurs, rubs, gallops RESPIRATORY:  Clear to auscultation without rales, wheezing or rhonchi  ABDOMEN: Soft, non-tender, non-distended EXTREMITIES:  No edema; No deformity   ASSESSMENT AND PLAN:    Paroxysmal Atrial Fibrillation  S/p Ablation 06/2016 Maintaining NSR Eliquis stopped in 12/2021 as he has not had recurrence.  Suprisingly, didn't even have post op AF from his procedures below.  CHA2DS2/VASc is 3. In shared decision making pt wishes to remain off of OAC unless he has more AF. Recommend monitoring his rhythm with a  wearable such as an apple watch.  HTN Stable on current regimen  HLD Continue statin  CAD s/p CABG 09/2022 Denies s/s ischemia  AS s/p AVR 10/10/2022 Followed by [facility] Stable     Follow up with Dr. Elberta Fortis in 12 months  Signed, Graciella Freer, PA-C

## 2022-12-19 NOTE — Patient Instructions (Signed)
Medication Instructions:  Your physician recommends that you continue on your current medications as directed. Please refer to the Current Medication list given to you today.  *If you need a refill on your cardiac medications before your next appointment, please call your pharmacy*  Lab Work: None ordered If you have labs (blood work) drawn today and your tests are completely normal, you will receive your results only by: MyChart Message (if you have MyChart) OR A paper copy in the mail If you have any lab test that is abnormal or we need to change your treatment, we will call you to review the results.  Follow-Up: At [facility], you and your health needs are our priority.  As part of our continuing mission to provide you with exceptional heart care, we have created designated Provider Care Teams.  These Care Teams include your primary Cardiologist (physician) and Advanced Practice Providers (APPs -  Physician Assistants and Nurse Practitioners) who all work together to provide you with the care you need, when you need it.  Your next appointment:   1 year(s)  Provider:   Will Camnitz, MD  

## 2022-12-22 ENCOUNTER — Encounter (HOSPITAL_COMMUNITY)
Admission: RE | Admit: 2022-12-22 | Discharge: 2022-12-22 | Disposition: A | Discharge status: Home / Self Care | Payer: Medicare Other | Source: Ambulatory Visit | Attending: Surgery

## 2022-12-22 DIAGNOSIS — Z951 Presence of aortocoronary bypass graft: Secondary | ICD-10-CM | POA: Diagnosis not present

## 2022-12-22 DIAGNOSIS — Z953 Presence of xenogenic heart valve: Secondary | ICD-10-CM | POA: Diagnosis not present

## 2022-12-22 DIAGNOSIS — I214 Non-ST elevation (NSTEMI) myocardial infarction: Secondary | ICD-10-CM | POA: Diagnosis not present

## 2022-12-22 NOTE — Progress Notes (Signed)
Daily Session Note  Patient Details  Name: Jon Jackson MRN: [id number] Date of Birth: March 31, 1951 Referring Provider:   Flowsheet Row CARDIAC REHAB PHASE II ORIENTATION from 11/12/2022 in [facility] CARDIAC REHABILITATION  Referring Provider Dr. Laneta Simmers       Encounter Date: 12/22/2022  Check In:  Session Check In - 12/22/22 0915       Check-In   Supervising physician immediately available to respond to emergencies See telemetry face sheet for immediately available MD    Location [facility]-Cardiac [facility] Pulmonary Rehab    Staff Present Ross Ludwig, BS, Exercise Physiologist;Debra Laural Benes, RN, BSN;Sondra Blixt, RN;Daphyne Daphine Deutscher, RN, BSN;Jessica [place], MA, RCEP, CCRP, CCET    Virtual Visit No    Medication changes reported     No    Fall or balance concerns reported    No    Tobacco Cessation No Change    Warm-up and Cool-down Performed on first and last piece of equipment    Resistance Training Performed Yes    VAD Patient? No    PAD/SET Patient? No      Pain Assessment   Currently in Pain? No/denies    Multiple Pain Sites No             Capillary Blood Glucose: No results found for this or any previous visit (from the past 24 hour(s)).    Social History   Tobacco Use  Smoking Status Some Days   Current packs/day: 0.25   Average packs/day: 0.3 packs/day for 47.0 years (11.8 ttl pk-yrs)   Types: Cigars, Cigarettes  Smokeless Tobacco Never  Tobacco Comments   Cigars- chews on tips    Goals Met:  Independence with exercise equipment Exercise tolerated well No report of concerns or symptoms today Strength training completed today  Goals Unmet:  Not Applicable  Comments: Pt able to follow exercise prescription today without complaint.  Will continue to monitor for progression.    Dr. Dina Rich is Medical Director for [facility] Cardiac Rehab

## 2022-12-24 ENCOUNTER — Encounter (HOSPITAL_COMMUNITY)
Admission: RE | Admit: 2022-12-24 | Discharge: 2022-12-24 | Disposition: A | Discharge status: Home / Self Care | Payer: Medicare Other | Source: Ambulatory Visit | Attending: Surgery | Admitting: Surgery

## 2022-12-24 DIAGNOSIS — I214 Non-ST elevation (NSTEMI) myocardial infarction: Secondary | ICD-10-CM | POA: Diagnosis not present

## 2022-12-24 DIAGNOSIS — Z953 Presence of xenogenic heart valve: Secondary | ICD-10-CM

## 2022-12-24 DIAGNOSIS — Z951 Presence of aortocoronary bypass graft: Secondary | ICD-10-CM

## 2022-12-24 NOTE — Progress Notes (Signed)
Daily Session Note  Patient Details  Name: Jon Jackson MRN: [id number] Date of Birth: May 16, 1950 Referring Provider:   Flowsheet Row CARDIAC REHAB PHASE II ORIENTATION from 11/12/2022 in [facility] CARDIAC REHABILITATION  Referring Provider Dr. Laneta Simmers       Encounter Date: 12/24/2022  Check In:  Session Check In - 12/24/22 0915       Check-In   Supervising physician immediately available to respond to emergencies [facility] MD immediately available    Physician(s) Dr. Wyline Mood    Location [facility]-Cardiac [facility] Pulmonary Rehab    Staff Present Ross Ludwig, BS, Exercise Physiologist;Benjy Kana BSN, RN;Jessica [place], MA, RCEP, CCRP, CCET    Virtual Visit No    Medication changes reported     No    Fall or balance concerns reported    No    Tobacco Cessation No Change    Warm-up and Cool-down Performed on first and last piece of equipment    Resistance Training Performed Yes    VAD Patient? No    PAD/SET Patient? No      Pain Assessment   Currently in Pain? No/denies    Multiple Pain Sites No             Capillary Blood Glucose: No results found for this or any previous visit (from the past 24 hour(s)).    Social History   Tobacco Use  Smoking Status Some Days   Current packs/day: 0.25   Average packs/day: 0.3 packs/day for 47.0 years (11.8 ttl pk-yrs)   Types: Cigars, Cigarettes  Smokeless Tobacco Never  Tobacco Comments   Cigars- chews on tips    Goals Met:  Independence with exercise equipment Exercise tolerated well No report of concerns or symptoms today Strength training completed today  Goals Unmet:  Not Applicable  Comments: Marland KitchenMarland KitchenPt able to follow exercise prescription today without complaint.  Will continue to monitor for progression.    Dr. Dina Rich is Medical Director for [facility] Cardiac Rehab

## 2022-12-24 NOTE — Progress Notes (Signed)
Reviewed home exercise with pt today.  Pt plans to walk on his land at home for exercise.  Reviewed THR, pulse, RPE, sign and symptoms, pulse oximetery and when to call 911 or MD.  Also discussed weather considerations and indoor options.  Pt voiced understanding.

## 2022-12-26 ENCOUNTER — Encounter (HOSPITAL_COMMUNITY)
Admission: RE | Admit: 2022-12-26 | Discharge: 2022-12-26 | Disposition: A | Discharge status: Home / Self Care | Payer: Medicare Other | Source: Ambulatory Visit | Attending: Surgery | Admitting: Surgery

## 2022-12-26 DIAGNOSIS — Z951 Presence of aortocoronary bypass graft: Secondary | ICD-10-CM

## 2022-12-26 DIAGNOSIS — Z953 Presence of xenogenic heart valve: Secondary | ICD-10-CM

## 2022-12-26 DIAGNOSIS — I214 Non-ST elevation (NSTEMI) myocardial infarction: Secondary | ICD-10-CM

## 2022-12-26 NOTE — Progress Notes (Signed)
Daily Session Note  Patient Details  Name: Jon Jackson MRN: [id number] Date of Birth: 19-Sep-1950 Referring Provider:   Flowsheet Row CARDIAC REHAB PHASE II ORIENTATION from 11/12/2022 in [facility] CARDIAC REHABILITATION  Referring Provider Dr. Laneta Simmers       Encounter Date: 12/26/2022  Check In:  Session Check In - 12/26/22 0915       Check-In   Supervising physician immediately available to respond to emergencies See telemetry face sheet for immediately available MD    Location [facility]-Cardiac [facility] Pulmonary Rehab    Staff Present Ross Ludwig, BS, Exercise Physiologist;Phyllis Billingsley, RN;Jessica [place], MA, RCEP, CCRP, Dow Adolph, RN, BSN    Virtual Visit No    Medication changes reported     No    Fall or balance concerns reported    No    Tobacco Cessation No Change    Warm-up and Cool-down Performed on first and last piece of equipment    Resistance Training Performed Yes    VAD Patient? No    PAD/SET Patient? No      Pain Assessment   Currently in Pain? No/denies    Multiple Pain Sites No             Capillary Blood Glucose: No results found for this or any previous visit (from the past 24 hour(s)).    Social History   Tobacco Use  Smoking Status Some Days   Current packs/day: 0.25   Average packs/day: 0.3 packs/day for 47.0 years (11.8 ttl pk-yrs)   Types: Cigars, Cigarettes  Smokeless Tobacco Never  Tobacco Comments   Cigars- chews on tips    Goals Met:  Independence with exercise equipment Exercise tolerated well No report of concerns or symptoms today Strength training completed today  Goals Unmet:  Not Applicable  Comments: Pt able to follow exercise prescription today without complaint.  Will continue to monitor for progression.    Dr. Dina Rich is Medical Director for [facility] Cardiac Rehab

## 2022-12-29 ENCOUNTER — Encounter (HOSPITAL_COMMUNITY)
Admission: RE | Admit: 2022-12-29 | Discharge: 2022-12-29 | Disposition: A | Discharge status: Home / Self Care | Payer: Medicare Other | Source: Ambulatory Visit | Attending: Surgery | Admitting: Surgery

## 2022-12-29 DIAGNOSIS — Z953 Presence of xenogenic heart valve: Secondary | ICD-10-CM | POA: Diagnosis not present

## 2022-12-29 DIAGNOSIS — Z951 Presence of aortocoronary bypass graft: Secondary | ICD-10-CM

## 2022-12-29 DIAGNOSIS — I214 Non-ST elevation (NSTEMI) myocardial infarction: Secondary | ICD-10-CM | POA: Diagnosis not present

## 2022-12-29 NOTE — Progress Notes (Signed)
Daily Session Note  Patient Details  Name: Jon Jackson MRN: [id number] Date of Birth: 1950-12-13 Referring Provider:   Flowsheet Row CARDIAC REHAB PHASE II ORIENTATION from 11/12/2022 in [facility] CARDIAC REHABILITATION  Referring Provider Dr. Laneta Simmers       Encounter Date: 12/29/2022  Check In:  Session Check In - 12/29/22 0915       Check-In   Supervising physician immediately available to respond to emergencies See telemetry face sheet for immediately available MD    Location [facility]-Cardiac [facility] Pulmonary Rehab    Staff Present Ross Ludwig, BS, Exercise Physiologist;Jessica Juanetta Gosling, MA, RCEP, CCRP, Dow Adolph, RN, BSN    Virtual Visit No    Medication changes reported     No    Fall or balance concerns reported    No    Tobacco Cessation No Change    Warm-up and Cool-down Performed on first and last piece of equipment    Resistance Training Performed Yes    VAD Patient? No    PAD/SET Patient? No      Pain Assessment   Currently in Pain? No/denies    Multiple Pain Sites No             Capillary Blood Glucose: No results found for this or any previous visit (from the past 24 hour(s)).    Social History   Tobacco Use  Smoking Status Some Days   Current packs/day: 0.25   Average packs/day: 0.3 packs/day for 47.0 years (11.8 ttl pk-yrs)   Types: Cigars, Cigarettes  Smokeless Tobacco Never  Tobacco Comments   Cigars- chews on tips    Goals Met:  Independence with exercise equipment Exercise tolerated well No report of concerns or symptoms today Strength training completed today  Goals Unmet:  Not Applicable  Comments: Pt able to follow exercise prescription today without complaint.  Will continue to monitor for progression.    Dr. Dina Rich is Medical Director for [facility] Cardiac Rehab

## 2022-12-31 ENCOUNTER — Encounter (HOSPITAL_COMMUNITY)
Admission: RE | Admit: 2022-12-31 | Discharge: 2022-12-31 | Disposition: A | Discharge status: Home / Self Care | Payer: Medicare Other | Source: Ambulatory Visit | Attending: Surgery | Admitting: Surgery

## 2022-12-31 DIAGNOSIS — Z953 Presence of xenogenic heart valve: Secondary | ICD-10-CM

## 2022-12-31 DIAGNOSIS — I214 Non-ST elevation (NSTEMI) myocardial infarction: Secondary | ICD-10-CM | POA: Diagnosis not present

## 2022-12-31 DIAGNOSIS — Z951 Presence of aortocoronary bypass graft: Secondary | ICD-10-CM | POA: Diagnosis not present

## 2022-12-31 NOTE — Progress Notes (Signed)
Daily Session Note  Patient Details  Name: Jon Jackson MRN: [id number] Date of Birth: 1951/03/12 Referring Provider:   Flowsheet Row CARDIAC REHAB PHASE II ORIENTATION from 11/12/2022 in [facility] CARDIAC REHABILITATION  Referring Provider Dr. Laneta Simmers       Encounter Date: 12/31/2022  Check In:  Session Check In - 12/31/22 0918       Check-In   Supervising physician immediately available to respond to emergencies See telemetry face sheet for immediately available MD    Location [facility]-Cardiac [facility] Pulmonary [facility]    Staff Present Ross Ludwig, BS, Exercise Physiologist;Jamair Cato Stiles BSN, RN;Jessica [place], MA, RCEP, CCRP, Dow Adolph, RN, BSN    Virtual Visit No    Medication changes reported     No    Fall or balance concerns reported    No    Tobacco Cessation No Change    Warm-up and Cool-down Performed on first and last piece of equipment    Resistance Training Performed Yes    VAD Patient? No    PAD/SET Patient? No      Pain Assessment   Currently in Pain? No/denies    Multiple Pain Sites No             Capillary Blood Glucose: No results found for this or any previous visit (from the past 24 hour(s)).    Social History   Tobacco Use  Smoking Status Some Days   Current packs/day: 0.25   Average packs/day: 0.3 packs/day for 47.0 years (11.8 ttl pk-yrs)   Types: Cigars, Cigarettes  Smokeless Tobacco Never  Tobacco Comments   Cigars- chews on tips    Goals Met:  Independence with exercise equipment Exercise tolerated well No report of concerns or symptoms today Strength training completed today  Goals Unmet:  Not Applicable  Comments: Marland KitchenMarland KitchenPt able to follow exercise prescription today without complaint.  Will continue to monitor for progression.    Dr. Dina Rich is Medical Director for [facility] Cardiac Rehab

## 2023-01-02 ENCOUNTER — Encounter (HOSPITAL_COMMUNITY)
Admission: RE | Admit: 2023-01-02 | Discharge: 2023-01-02 | Disposition: A | Discharge status: Home / Self Care | Payer: Medicare Other | Source: Ambulatory Visit | Attending: Surgery

## 2023-01-02 DIAGNOSIS — Z951 Presence of aortocoronary bypass graft: Secondary | ICD-10-CM | POA: Diagnosis not present

## 2023-01-02 DIAGNOSIS — Z953 Presence of xenogenic heart valve: Secondary | ICD-10-CM

## 2023-01-02 DIAGNOSIS — I214 Non-ST elevation (NSTEMI) myocardial infarction: Secondary | ICD-10-CM | POA: Diagnosis not present

## 2023-01-02 NOTE — Progress Notes (Signed)
Daily Session Note  Patient Details  Name: Jon Jackson MRN: [id number] Date of Birth: 08/28/1950 Referring Provider:   Flowsheet Row CARDIAC REHAB PHASE II ORIENTATION from 11/12/2022 in [facility] CARDIAC REHABILITATION  Referring Provider Dr. Laneta Simmers       Encounter Date: 01/02/2023  Check In:  Session Check In - 01/02/23 0915       Check-In   Supervising physician immediately available to respond to emergencies See telemetry face sheet for immediately available ER MD    Location [facility]-Cardiac [facility] Pulmonary Rehab    Staff Present Rodena Medin, RN, BSN;Jessica Juanetta Gosling, MA, RCEP, CCRP, CCET;Hillary [employer], RN    Virtual Visit No    Medication changes reported     No    Fall or balance concerns reported    No    Warm-up and Cool-down Performed on first and last piece of equipment    Resistance Training Performed Yes    VAD Patient? No    PAD/SET Patient? No      Pain Assessment   Currently in Pain? No/denies    Multiple Pain Sites No             Capillary Blood Glucose: No results found for this or any previous visit (from the past 24 hour(s)).    Social History   Tobacco Use  Smoking Status Some Days   Current packs/day: 0.25   Average packs/day: 0.3 packs/day for 47.0 years (11.8 ttl pk-yrs)   Types: Cigars, Cigarettes  Smokeless Tobacco Never  Tobacco Comments   Cigars- chews on tips    Goals Met:  Independence with exercise equipment Exercise tolerated well No report of concerns or symptoms today Strength training completed today  Goals Unmet:  Not Applicable  Comments: Pt able to follow exercise prescription today without complaint.  Will continue to monitor for progression.    Dr. Dina Rich is Medical Director for [facility] Cardiac Rehab

## 2023-01-05 ENCOUNTER — Encounter (HOSPITAL_COMMUNITY)
Admission: RE | Admit: 2023-01-05 | Discharge: 2023-01-05 | Disposition: A | Discharge status: Home / Self Care | Payer: Medicare Other | Source: Ambulatory Visit | Attending: Surgery

## 2023-01-05 DIAGNOSIS — I214 Non-ST elevation (NSTEMI) myocardial infarction: Secondary | ICD-10-CM | POA: Diagnosis not present

## 2023-01-05 DIAGNOSIS — Z953 Presence of xenogenic heart valve: Secondary | ICD-10-CM | POA: Diagnosis not present

## 2023-01-05 DIAGNOSIS — Z951 Presence of aortocoronary bypass graft: Secondary | ICD-10-CM

## 2023-01-05 NOTE — Progress Notes (Signed)
Daily Session Note  Patient Details  Name: Jon Jackson MRN: [id number] Date of Birth: January 20, 1951 Referring Provider:   Flowsheet Row CARDIAC REHAB PHASE II ORIENTATION from 11/12/2022 in [facility] C[facility]AC [facility]ATION  Referring Provider Dr. Laneta Simmers       Encounter Date: 01/05/2023  Check In:  Session Check In - 01/05/23 0900       Check-In   Supervising physician immediately available to respond to emergencies See telemetry face sheet for immediately available ER MD    Location [facility]    Staff Present Fabio Pierce, MA, RCEP, CCRP, Dow Adolph, RN, Pleas Koch, RN, BSN    Virtual Visit No    Medication changes reported     No    Fall or balance concerns reported    No    Warm-up and Cool-down Performed on first and last piece of equipment    Resistance Training Performed Yes    VAD Patient? No    PAD/SET Patient? No      Pain Assessment   Currently in Pain? No/denies    Multiple Pain Sites No             Capillary Blood Glucose: No results found for this or any previous visit (from the past 24 hour(s)).    Social History   Tobacco Use  Smoking Status Some Days   Current packs/day: 0.25   Average packs/day: 0.3 packs/day for 47.0 years (11.8 ttl pk-yrs)   Types: Cigars, Cigarettes  Smokeless Tobacco Never  Tobacco Comments   Cigars- chews on tips    Goals Met:  Independence with exercise equipment Exercise tolerated well No report of concerns or symptoms today Strength training completed today  Goals Unmet:  Not Applicable  Comments: Pt able to follow exercise prescription today without complaint.  Will continue to monitor for progression.    Dr. Dina Rich is Medical Director for [facility]

## 2023-01-07 ENCOUNTER — Encounter (HOSPITAL_COMMUNITY): Payer: Self-pay | Admitting: *Deleted

## 2023-01-07 ENCOUNTER — Encounter (HOSPITAL_COMMUNITY)
Admission: RE | Admit: 2023-01-07 | Discharge: 2023-01-07 | Disposition: A | Discharge status: Home / Self Care | Payer: Medicare Other | Source: Ambulatory Visit | Attending: Surgery | Admitting: Surgery

## 2023-01-07 DIAGNOSIS — I214 Non-ST elevation (NSTEMI) myocardial infarction: Secondary | ICD-10-CM | POA: Diagnosis not present

## 2023-01-07 DIAGNOSIS — Z953 Presence of xenogenic heart valve: Secondary | ICD-10-CM | POA: Diagnosis not present

## 2023-01-07 DIAGNOSIS — Z951 Presence of aortocoronary bypass graft: Secondary | ICD-10-CM

## 2023-01-07 NOTE — Progress Notes (Signed)
Cardiac Individual Treatment Plan  Patient Details  Name: Jon Jackson MRN: [id number] Date of Birth: Nov 01, 1950 Referring Provider:   Flowsheet Row CARDIAC REHAB PHASE II ORIENTATION from 11/12/2022 in [facility] CARDIAC REHABILITATION  Referring Provider Dr. Laneta Simmers       Initial Encounter Date:  Flowsheet Row CARDIAC REHAB PHASE II ORIENTATION from 11/12/2022 in [place] CARDIAC REHABILITATION  Date 11/12/22       Visit Diagnosis: NSTEMI (non-ST elevated myocardial infarction) (HCC)  S/P aortic valve replacement with bioprosthetic valve  S/P CABG x 1  Patient's Home Medications on Admission:  Current Outpatient Medications:    aspirin EC 325 MG tablet, Take 1 tablet (325 mg total) by mouth daily. (Patient taking differently: Take 81 mg by mouth daily.), Disp: , Rfl:    atorvastatin (LIPITOR) 80 MG tablet, Take 1 tablet (80 mg total) by mouth daily., Disp: 90 tablet, Rfl: 3   citalopram (CELEXA) 20 MG tablet, Take 1 tablet (20 mg total) by mouth daily., Disp: 90 tablet, Rfl: 1   Fe Fum-Vit C-Vit B12-FA (TRIGELS-F FORTE) CAPS capsule, Take 1 capsule by mouth daily after breakfast., Disp: 30 capsule, Rfl: 2   losartan (COZAAR) 50 MG tablet, Take 1 tablet (50 mg total) by mouth daily., Disp: 90 tablet, Rfl: 1   Multiple Vitamin (MULTIVITAMIN) tablet, Take 1 tablet by mouth daily., Disp: , Rfl:    omeprazole (PRILOSEC) 40 MG capsule, Take 1 capsule (40 mg total) by mouth daily., Disp: 90 capsule, Rfl: 1  Past Medical History: Past Medical History:  Diagnosis Date   Anemia    as a child   Anxiety    Arthritis    Atrial fibrillation (HCC)    Basal cell carcinoma    head   Coronary artery disease    Depression    Dysrhythmia    GERD (gastroesophageal reflux disease)    Heart murmur    Hyperlipidemia    Myocardial infarction (HCC) 10/02/2022   Pneumonia 02/2022   Squamous carcinoma     Tobacco Use: Social History   Tobacco Use  Smoking Status Some Days    Current packs/day: 0.25   Average packs/day: 0.3 packs/day for 47.0 years (11.8 ttl pk-yrs)   Types: Cigars, Cigarettes  Smokeless Tobacco Never  Tobacco Comments   Cigars- chews on tips    Labs: Review Flowsheet  More data exists      Latest Ref Rng & Units 07/10/2022 10/03/2022 10/09/2022 10/10/2022 11/18/2022  Labs for ITP Cardiac and Pulmonary Rehab  Cholestrol 100 - 199 mg/dL 093  235  - - 573   LDL (calc) 0 - 99 mg/dL 220  254  - - 80   HDL-C >39 mg/dL 50  47  - - 45   Trlycerides 0 - 149 mg/dL 90  75  - - 270   Hemoglobin A1c 4.8 - 5.6 % - - 5.2  - -  PH, Arterial 7.35 - 7.45 - 7.393  7.41  7.424  7.313  7.253  7.327  7.390  7.355  7.303  -  PCO2 arterial 32 - 48 mmHg - 36.6  38  29.8  46.0  52.1  45.9  40.1  42.5  51.8  -  Bicarbonate 20.0 - 28.0 mmol/L - 22.3  24.9  25.0  24.1  19.7  23.3  23.0  24.0  24.2  31.1  23.8  25.7  -  TCO2 22 - 32 mmol/L - 23  26  26   - 21  25  25  25  26  26  25  27   33  25  28  27  28   -  Acid-base deficit 0.0 - 2.0 mmol/L - 2.0  1.0  1.0  0.3  4.0  3.0  5.0  2.0  1.0  2.0  1.0  -  O2 Saturation % - 95  73  76  99.4  99  99  96  100  100  81  100  100  -    Details       Multiple values from one day are sorted in reverse-chronological order         Capillary Blood Glucose: Lab Results  Component Value Date   GLUCAP 106 (H) 10/14/2022   GLUCAP 112 (H) 10/13/2022   GLUCAP 106 (H) 10/13/2022   GLUCAP 85 10/13/2022   GLUCAP 90 10/13/2022     Exercise Target Goals: Exercise Program Goal: Individual exercise prescription set using results from initial 6 min walk test and THRR while considering  patient's activity barriers and safety.   Exercise Prescription Goal: Starting with aerobic activity 30 plus minutes a day, 3 days per week for initial exercise prescription. Provide home exercise prescription and guidelines that participant acknowledges understanding prior to discharge.  Activity Barriers & Risk Stratification:  Activity Barriers  & Cardiac Risk Stratification - 11/12/22 1413       Activity Barriers & Cardiac Risk Stratification   Activity Barriers Other (comment)    Comments Patient has limited mobility in his left arm due to old shoulder injury repaired with surgery.    Cardiac Risk Stratification High             6 Minute Walk:  6 Minute Walk     Row Name 11/12/22 1603         6 Minute Walk   Phase Initial     Distance 1600 feet     Walk Time 6 minutes     # of Rest Breaks 0     MPH 3.03     METS 3.11     RPE 11     VO2 Peak 10.9     Symptoms No     Resting HR 63 bpm     Resting BP 160/70     Resting Oxygen Saturation  96 %     Exercise Oxygen Saturation  during 6 min walk 96 %     Max Ex. HR 77 bpm     Max Ex. BP 156/68     2 Minute Post BP 146/64              Oxygen Initial Assessment:   Oxygen Re-Evaluation:   Oxygen Discharge (Final Oxygen Re-Evaluation):   Initial Exercise Prescription:  Initial Exercise Prescription - 11/12/22 1600       Date of Initial Exercise RX and Referring Provider   Date 11/12/22    Referring Provider Dr. Laneta Simmers      Oxygen   Maintain Oxygen Saturation 88% or higher      Treadmill   MPH 2    Grade 0.5    Minutes 15      REL-XR   Level 2    Speed 50    Minutes 15      Prescription Details   Frequency (times per week) 3    Duration Progress to 30 minutes of continuous aerobic without signs/symptoms of physical distress      Intensity   THRR 40-80% of Max  Heartrate 97-131    Ratings of Perceived Exertion 11-13      Resistance Training   Training Prescription Yes    Weight 4    Reps 10-15             Perform Capillary Blood Glucose checks as needed.  Exercise Prescription Changes:   Exercise Prescription Changes     Row Name 12/03/22 1200 12/22/22 0854 12/24/22 0900         Response to Exercise   Blood Pressure (Admit) 132/72 130/60 --     Blood Pressure (Exercise) 134/62 -- --     Blood Pressure (Exit)  128/70 138/80 --     Heart Rate (Admit) 58 bpm 90 bpm --     Heart Rate (Exercise) 101 bpm 107 bpm --     Heart Rate (Exit) 64 bpm 85 bpm --     Rating of Perceived Exertion (Exercise) 13 13 --     Duration Continue with 30 min of aerobic exercise without signs/symptoms of physical distress. Continue with 30 min of aerobic exercise without signs/symptoms of physical distress. --     Intensity THRR unchanged THRR unchanged --       Progression   Progression Continue to progress workloads to maintain intensity without signs/symptoms of physical distress. Continue to progress workloads to maintain intensity without signs/symptoms of physical distress. --       Paramedic Prescription Yes Yes --     Weight 5 5 lbs --     Reps 10-15 10-15 --     Time -- 10 Minutes --       Treadmill   MPH 2.5 2.3 --     Grade 0.5 0.5 --     Minutes 15 15 --     METs 3.09 2.92 --       NuStep   Level 3 3 --     SPM 75 70 --     Minutes 15 15 --     METs 1.9 1.8 --       Home Exercise Plan   Plans to continue exercise at -- -- Home (comment)  walking, staff videos     Frequency -- -- Add 2 additional days to program exercise sessions.     Initial Home Exercises Provided -- -- 12/24/22       Oxygen   Maintain Oxygen Saturation 88% or higher 88% or higher --              Exercise Comments:   Exercise Goals and Review:   Exercise Goals     Row Name 11/12/22 1606             Exercise Goals   Increase Physical Activity Yes       Intervention Provide advice, education, support and counseling about physical activity/exercise needs.;Develop an individualized exercise prescription for aerobic and resistive training based on initial evaluation findings, risk stratification, comorbidities and participant's personal goals.       Expected Outcomes Short Term: Attend rehab on a regular basis to increase amount of physical activity.;Long Term: Exercising regularly at least 3-5  days a week.;Long Term: Add in home exercise to make exercise part of routine and to increase amount of physical activity.       Increase Strength and Stamina Yes       Intervention Provide advice, education, support and counseling about physical activity/exercise needs.;Develop an individualized exercise prescription for aerobic and resistive training based on initial  evaluation findings, risk stratification, comorbidities and participant's personal goals.       Expected Outcomes Short Term: Increase workloads from initial exercise prescription for resistance, speed, and METs.;Short Term: Perform resistance training exercises routinely during rehab and add in resistance training at home;Long Term: Improve cardiorespiratory fitness, muscular endurance and strength as measured by increased METs and functional capacity ( )       Able to understand and use rate of perceived exertion (RPE) scale Yes       Intervention Provide education and explanation on how to use RPE scale       Expected Outcomes Short Term: Able to use RPE daily in rehab to express subjective intensity level;Long Term:  Able to use RPE to guide intensity level when exercising independently       Knowledge and understanding of Target Heart Rate Range (THRR) Yes       Intervention Provide education and explanation of THRR including how the numbers were predicted and where they are located for reference       Expected Outcomes Long Term: Able to use THRR to govern intensity when exercising independently;Short Term: Able to state/look up THRR;Short Term: Able to use daily as guideline for intensity in rehab       Able to check pulse independently Yes       Intervention Provide education and demonstration on how to check pulse in carotid and radial arteries.;Review the importance of being able to check your own pulse for safety during independent exercise       Expected Outcomes Short Term: Able to explain why pulse checking is important  during independent exercise;Long Term: Able to check pulse independently and accurately       Understanding of Exercise Prescription Yes       Intervention Provide education, explanation, and written materials on patient's individual exercise prescription       Expected Outcomes Short Term: Able to explain program exercise prescription;Long Term: Able to explain home exercise prescription to exercise independently                Exercise Goals Re-Evaluation :  Exercise Goals Re-Evaluation     Row Name 11/26/22 1008 12/23/22 0855 12/24/22 0955         Exercise Goal Re-Evaluation   Exercise Goals Review Increase Physical Activity;Increase Strength and Stamina;Able to understand and use rate of perceived exertion (RPE) scale;Knowledge and understanding of Target Heart Rate Range (THRR);Understanding of Exercise Prescription Increase Physical Activity;Increase Strength and Stamina;Understanding of Exercise Prescription Increase Physical Activity;Able to understand and use rate of perceived exertion (RPE) scale;Increase Strength and Stamina;Able to understand and use Dyspnea scale;Knowledge and understanding of Target Heart Rate Range (THRR);Able to check pulse independently;Understanding of Exercise Prescription     Comments Patient says he feels he is doing very well in the program. He reports he feels he is getting stronger and his stamina is improving. 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 He walks his dogs on trails and does a lot of walking taking care of his farm and animals. He is progressing on the equipment. Athan has been out of rehab and is getting back into his levels. He decreased his workloads on both sets of equipment. He is exercising at 2.3 speed on the treadmill and decreased his SPM on the NuStep. He is at an RPE of 13 on both sets. Will continue to monitor and  progress as able. Caylob has been doing  well in rehab again.  He is walking some on his off days and starting to notice improvements in his stamina. Reviewed home exercise with pt today.  Pt plans to walk on his land at home for exercise.  Reviewed THR, pulse, RPE, sign and symptoms, pulse oximetery and when to call 911 or MD.  Also discussed weather considerations and indoor options.  Pt voiced understanding.     Expected Outcomes Short Term: Patient will continu to work hard during sessions and continue to walk at home. Long Term: Patient will continue to exercise at home and continue to be active. Short term: Contiue to exercise at workloads and get back to workloads before taking time off   long term: continue to attend rehab Short: Add in more walking at home consistentnly Long; Conitnue to exercise independently               Discharge Exercise Prescription (Final Exercise Prescription Changes):  Exercise Prescription Changes - 12/24/22 0900       Home Exercise Plan   Plans to continue exercise at Home (comment)   walking, staff videos   Frequency Add 2 additional days to program exercise sessions.    Initial Home Exercises Provided 12/24/22             Nutrition:  Target Goals: Understanding of nutrition guidelines, daily intake of sodium 1500mg , cholesterol 200mg , calories 30% from fat and 7% or less from saturated fats, daily to have 5 or more servings of fruits and vegetables.  Biometrics:  Pre Biometrics - 11/12/22 1607       Pre Biometrics   Height 5\' 10"  (1.778 m)    Weight 210 lb 1.6 oz (95.3 kg)    Waist Circumference 42 inches    Hip Circumference 41 inches    Waist to Hip Ratio 1.02 %    BMI (Calculated) 30.15    Grip Strength 22.9 kg    Flexibility 0 in    Single Leg Stand 21.64 seconds              Nutrition Therapy Plan and Nutrition Goals:   Nutrition Assessments:  MEDIFICTS Score Key: >=70 Need to make dietary changes  40-70 Heart Healthy Diet <= 40 Therapeutic Level  Cholesterol Diet  Flowsheet Row CARDIAC REHAB PHASE II EXERCISE from 11/17/2022 in [facility] CARDIAC REHABILITATION  Picture Your Plate Total Score on Admission 55      Picture Your Plate Scores: <16 Unhealthy dietary pattern with much room for improvement. 41-50 Dietary pattern unlikely to meet recommendations for good health and room for improvement. 51-60 More healthful dietary pattern, with some room for improvement.  >60 Healthy dietary pattern, although there may be some specific behaviors that could be improved.    Nutrition Goals Re-Evaluation:  Nutrition Goals Re-Evaluation     Row Name 11/26/22 1004 12/24/22 0959           Goals   Current Weight 207 lb (93.9 kg) --      Nutrition Goal Patient wants to continue to work on eating less fatty food and work on portion control. Short Term: continue to work on eating less fatty food and portion control. Long Term: Keep his weight under 210 lbs.      Comment Patient says he knows he needs to work on his diet. He is trying to eat more fish like tuna for lunch and cut back on pork and beef. He wants to  remain below 210 lbs. Claire is doing well in rehab.  He usually does pretty good with his diet.  They are doing more steamed vegetables and avoiding fried foods overall.  He did admit to cheating some last week with pizza.  They avoid fried foods.  He is trying to stay away from salt.  He continues to work on portion control.      Expected Outcome Short Term: continue to work on eating less fatty food and portion control. Long Term: Keep his weight under 210 lbs. Short: Continue to work on portion control Long: Continue to focus on heart healthy eating               Nutrition Goals Discharge (Final Nutrition Goals Re-Evaluation):  Nutrition Goals Re-Evaluation - 12/24/22 0959       Goals   Nutrition Goal Short Term: continue to work on eating less fatty food and portion control. Long Term: Keep his weight under 210 lbs.     Comment Leo is doing well in rehab.  He usually does pretty good with his diet.  They are doing more steamed vegetables and avoiding fried foods overall.  He did admit to cheating some last week with pizza.  They avoid fried foods.  He is trying to stay away from salt.  He continues to work on portion control.    Expected Outcome Short: Continue to work on portion control Long: Continue to focus on heart healthy eating             Psychosocial: Target Goals: Acknowledge presence or absence of significant depression and/or stress, maximize coping skills, provide positive support system. Participant is able to verbalize types and ability to use techniques and skills needed for reducing stress and depression.  Initial Review & Psychosocial Screening:  Initial Psych Review & Screening - 11/12/22 1506       Initial Review   Current issues with History of Depression      Family Dynamics   Good Support System? Yes    Comments Patient's wife and 4 chidlren are his support.      Barriers   Psychosocial barriers to participate in program There are no identifiable barriers or psychosocial needs.      Screening Interventions   Interventions Encouraged to exercise;Provide feedback about the scores to participant    Expected Outcomes Short Term goal: Utilizing psychosocial counselor, staff and physician to assist with identification of specific Stressors or current issues interfering with healing process. Setting desired goal for each stressor or current issue identified.;Long Term Goal: Stressors or current issues are controlled or eliminated.;Short Term goal: Identification and review with participant of any Quality of Life or Depression concerns found by scoring the questionnaire.;Long Term goal: The participant improves quality of Life and PHQ9 Scores as seen by post scores and/or verbalization of changes             Quality of Life Scores:  Quality of Life - 11/12/22 1608        Quality of Life   Select Quality of Life      Quality of Life Scores   Health/Function Pre 19.39 %    Socioeconomic Pre 21.64 %    Psych/Spiritual Pre 20.86 %    Family Pre 27.6 %    GLOBAL Pre 21.42 %            Scores of 19 and below usually indicate a poorer quality of life in these areas.  A difference of  2-3 points is a clinically meaningful difference.  A difference of 2-3 points in the total score of the Quality of Life Index has been associated with significant improvement in overall quality of life, self-image, physical symptoms, and general health in studies assessing change in quality of life.  PHQ-9: Review Flowsheet  More data exists      11/18/2022 11/12/2022 10/17/2022 07/10/2022 05/09/2022  Depression screen PHQ 2/9  Decreased Interest 1 0 1 1 0  Down, Depressed, Hopeless 0 0 0 0 0  PHQ - 2 Score 1 0 1 1 0  Altered sleeping 0 0 0 0 -  Tired, decreased energy 1 1 1 1  -  Change in appetite 0 0 0 0 -  Feeling bad or failure about yourself  0 0 0 0 -  Trouble concentrating 0 1 1 0 -  Moving slowly or fidgety/restless 0 0 0 0 -  Suicidal thoughts 0 0 0 0 -  PHQ-9 Score 2 2 3 2  -  Difficult doing work/chores - Not difficult at all Not difficult at all Somewhat difficult -    Details           Interpretation of Total Score  Total Score Depression Severity:  1-4 = Minimal depression, 5-9 = Mild depression, 10-14 = Moderate depression, 15-19 = Moderately severe depression, 20-27 = Severe depression   Psychosocial Evaluation and Intervention:  Psychosocial Evaluation - 11/12/22 1507       Psychosocial Evaluation & Interventions   Interventions Relaxation education;Stress management education;Encouraged to exercise with the program and follow exercise prescription    Comments Patient referred to cardiac rehab with S/P Aortic Valve Replacement; CABGx1 and NSTEMI. His PHQ-9 score was 2 due to lack of energy and trouble concentrating at times. He does have a history  of depression and is currently on Celexa. He says he has been taking this medication for 10+ years and feels it manages his depression well. He denies any anxiety or significant stressors in his life. He quit smoking during his hospitalization 10/02/22. He was using nicotine patches but says he quit using. He says he is able to deal with his urges to smoke. He says these episodes are worse if he is around someone smoking so he tries to avoid this. No one in his family smokes. He and his son-in-law quit at the same time. He reports sleeping well. He is retired from [employer]. 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 He has a lot of farm animals to care for and he says he really enjoys being outside caring for his animals and taking care of his farm. He is currently not exercising but says he does do a lot of walking on his farm. His goals for the program are to improve his stamina and to feel better overall and be able to get back to his normal activities. He has no barriers identified to participate in the program.    Expected Outcomes Short Term: attend the program consistently; Long Term: Improve his stamina and feel better.    Continue Psychosocial Services  Follow up required by staff             Psychosocial Re-Evaluation:  Psychosocial Re-Evaluation     Row Name 11/26/22 1103 12/24/22 0956           Psychosocial Re-Evaluation   Current issues with Current Depression;Current Psychotropic Meds Current Depression;Current Psychotropic  Meds;Current Stress Concerns      Comments Patient is doing very well in the program. He says he feels like he is making a lot of progress and is meeting his goals. His strength and stamina are improving and he is able to do all the activities he wants to do without difficulty. He continues to remain tobacco free. He says he still has urges at times but these are decreasing. He is sleeping  well and reports no stress concerns. He continue to take celexa to manage his depression and feels it is managed well. We will continue to monitor his progress. Zyonn is doing well in rehab. He missed some from being sick and then last week his mother passed.  He is happy for her as she is no longer suffering, but now dealing with his grief and affairs of her state.  Otherwise, he is feeling better and doing well.  He denies any depression symptoms.  He still struggles with sleep as he wakes around 1-2am and difficultly to get back to sleep.  He is using melatonin to help.      Expected Outcomes Short Term Goal: Continue to attend sessions and get stronger and abstain from smoking. Long Term: Continue to abstain from smoking and continue to keep his depression mananged. Short: Conitnue to cope with loss of mother Long: Conitnue to exercise for mental boost      Interventions Encouraged to attend Cardiac Rehabilitation for the exercise;Relaxation education;Stress management education Stress management education;Encouraged to attend Cardiac Rehabilitation for the exercise      Continue Psychosocial Services  Follow up required by staff Follow up required by staff               Psychosocial Discharge (Final Psychosocial Re-Evaluation):  Psychosocial Re-Evaluation - 12/24/22 0956       Psychosocial Re-Evaluation   Current issues with Current Depression;Current Psychotropic Meds;Current Stress Concerns    Comments Eaden is doing well in rehab. He missed some from being sick and then last week his mother passed.  He is happy for her as she is no longer suffering, but now dealing with his grief and affairs of her state.  Otherwise, he is feeling better and doing well.  He denies any depression symptoms.  He still struggles with sleep as he wakes around 1-2am and difficultly to get back to sleep.  He is using melatonin to help.    Expected Outcomes Short: Conitnue to cope with loss of mother Long:  Conitnue to exercise for mental boost    Interventions Stress management education;Encouraged to attend Cardiac Rehabilitation for the exercise    Continue Psychosocial Services  Follow up required by staff             Vocational Rehabilitation: Provide vocational rehab assistance to qualifying candidates.   Vocational Rehab Evaluation & Intervention:  Vocational Rehab - 11/12/22 1506       Initial Vocational Rehab Evaluation & Intervention   Assessment shows need for Vocational Rehabilitation No      Vocational Rehab Re-Evaulation   Comments Patient is retired.             Education: Education Goals: Education classes will be provided on a weekly basis, covering required topics. Participant will state understanding/return demonstration of topics presented.  Learning Barriers/Preferences:  Learning Barriers/Preferences - 11/12/22 1505       Learning Barriers/Preferences   Learning Barriers None    Learning Preferences Audio  Education Topics: Hypertension, Hypertension Reduction -Define heart disease and high blood pressure. Discus how high blood pressure affects the body and ways to reduce high blood pressure.   Exercise and Your Heart -Discuss why it is important to exercise, the FITT principles of exercise, normal and abnormal responses to exercise, and how to exercise safely. Flowsheet Row CARDIAC REHAB PHASE II EXERCISE from 12/31/2022 in [place] CARDIAC REHABILITATION  Date 12/24/22  Educator [facility]  Instruction Review Code 1- Verbalizes Understanding       Angina -Discuss definition of angina, causes of angina, treatment of angina, and how to decrease risk of having angina.   Cardiac Medications -Review what the following cardiac medications are used for, how they affect the body, and side effects that may occur when taking the medications.  Medications include Aspirin, Beta blockers, calcium channel blockers, ACE Inhibitors,  angiotensin receptor blockers, diuretics, digoxin, and antihyperlipidemics. Flowsheet Row CARDIAC REHAB PHASE II EXERCISE from 11/26/2022 in [place] CARDIAC REHABILITATION  Date 11/26/22  Educator DJ  Instruction Review Code 1- Verbalizes Understanding       Congestive Heart Failure -Discuss the definition of CHF, how to live with CHF, the signs and symptoms of CHF, and how keep track of weight and sodium intake.   Heart Disease and Intimacy -Discus the effect sexual activity has on the heart, how changes occur during intimacy as we age, and safety during sexual activity. Flowsheet Row CARDIAC REHAB PHASE II EXERCISE from 12/31/2022 in [place] CARDIAC REHABILITATION  Date 12/31/22  Educator HB  Instruction Review Code 1- Verbalizes Understanding       Smoking Cessation / COPD -Discuss different methods to quit smoking, the health benefits of quitting smoking, and the definition of COPD. Flowsheet Row CARDIAC REHAB PHASE II EXERCISE from 12/31/2022 in [place] CARDIAC REHABILITATION  Date 12/03/22  Educator [facility]  Instruction Review Code 2- Demonstrated Understanding       Nutrition I: Fats -Discuss the types of cholesterol, what cholesterol does to the heart, and how cholesterol levels can be controlled.   Nutrition II: Labels -Discuss the different components of food labels and how to read food label   Heart Parts/Heart Disease and PAD -Discuss the anatomy of the heart, the pathway of blood circulation through the heart, and these are affected by heart disease.   Stress I: Signs and Symptoms -Discuss the causes of stress, how stress may lead to anxiety and depression, and ways to limit stress. Flowsheet Row CARDIAC REHAB PHASE II EXERCISE from 11/26/2022 in [place] CARDIAC REHABILITATION  Date 11/19/22  Educator [facility]  Instruction Review Code 1- Verbalizes Understanding       Stress II: Relaxation -Discuss different types of relaxation techniques to limit  stress. Flowsheet Row CARDIAC REHAB PHASE II EXERCISE from 11/26/2022 in [place] CARDIAC REHABILITATION  Date 11/19/22  Educator [facility]  Instruction Review Code 1- Verbalizes Understanding       Warning Signs of Stroke / TIA -Discuss definition of a stroke, what the signs and symptoms are of a stroke, and how to identify when someone is having stroke.   Knowledge Questionnaire Score:  Knowledge Questionnaire Score - 11/19/22 1040       Knowledge Questionnaire Score   Pre Score 20/24             Core Components/Risk Factors/Patient Goals at Admission:  Personal Goals and Risk Factors at Admission - 11/12/22 1506       Core Components/Risk Factors/Patient Goals on Admission  Weight Management Weight Maintenance    Hypertension Yes    Intervention Provide education on lifestyle modifcations including regular physical activity/exercise, weight management, moderate sodium restriction and increased consumption of fresh fruit, vegetables, and low fat dairy, alcohol moderation, and smoking cessation.;Monitor prescription use compliance.    Expected Outcomes Short Term: Continued assessment and intervention until BP is < 140/46mm HG in hypertensive participants. < 130/15mm HG in hypertensive participants with diabetes, heart failure or chronic kidney disease.;Long Term: Maintenance of blood pressure at goal levels.    Lipids Yes    Intervention Provide education and support for participant on nutrition & aerobic/resistive exercise along with prescribed medications to achieve LDL 70mg , HDL >40mg .    Expected Outcomes Short Term: Participant states understanding of desired cholesterol values and is compliant with medications prescribed. Participant is following exercise prescription and nutrition guidelines.;Long Term: Cholesterol controlled with medications as prescribed, with individualized exercise RX and with personalized nutrition plan. Value goals: LDL < 70mg , HDL > 40 mg.              Core Components/Risk Factors/Patient Goals Review:   Goals and Risk Factor Review     Row Name 11/26/22 0956 12/24/22 1001           Core Components/Risk Factors/Patient Goals Review   Personal Goals Review Weight Management/Obesity;Hypertension;Lipids Weight Management/Obesity;Hypertension;Lipids      Review Patient is doing well in the program with consistent attendance. His goal for his weight is to remain under 210 lbs. He is currently at 207 lbs so he is pleased with this. He is trying to watch his diet and portion control. He says he feels great and is able to do all he wants to do. His blood pressure is above goal averaging 140 to 150 systolic. He says he does check his blood pressure at home. He says he usually gets 150/60. He says he is taking his medications as prescribed and he takes his blood pressure medication at night because they make him drowsy. I told him we were going to notiffy his cardiologist of his readings. He requested we reach out to Robin Searing, FNP as this is the provider he has seen the most. We will continue to monitor his progress in the program. Tasman is doing well in rehab.  He really wants to get below 210 lb.  However, his weight is starting to increase as he finds when he is active he wants to eat more. We talked about allowing body to adjust to burning off stores versus eating more to compensate.  His pressures are doing well and he continues to check them at home.  He is feeling well managed on his medications and good overall.      Expected Outcomes Short Term: Continue to monitor b/p at home. Long term: Continue to be compliant with his medications and with weight management. Short: Conitnue to work on weight loss long; Conitnue to montior risk factors               Core Components/Risk Factors/Patient Goals at Discharge (Final Review):   Goals and Risk Factor Review - 12/24/22 1001       Core Components/Risk Factors/Patient Goals Review    Personal Goals Review Weight Management/Obesity;Hypertension;Lipids    Review Dieter is doing well in rehab.  He really wants to get below 210 lb.  However, his weight is starting to increase as he finds when he is active he wants to eat more. We talked about allowing body to  adjust to burning off stores versus eating more to compensate.  His pressures are doing well and he continues to check them at home.  He is feeling well managed on his medications and good overall.    Expected Outcomes Short: Conitnue to work on weight loss long; Conitnue to montior risk factors             ITP Comments:  ITP Comments     Row Name 11/12/22 1611 12/10/22 0754 01/07/23 0649       ITP Comments Patient attend orientation today.  Patient is attendingCardiac Rehabilitation Program.  Documentation for diagnosis can be found in [facility] 10/10/22.  Reviewed medical chart, RPE/RPD, gym safety, and program guidelines.  Patient was fitted to equipment they will be using during rehab.  Patient is scheduled to start exercise on 11/17/22 at 915.   Initial ITP created and sent for review and signature by Dr. Dina Rich, Medical Director for Cardiac Rehabilitation Program. 30 day review completed. ITP sent to Dr. Dina Rich, Medical Director of Cardiac Rehab. Continue with ITP unless changes are made by physician.   Pt called out yesterday, exposed to COVID on Monday, c/o headache, encouraged to test and keep [place] posted. 30 day review completed. ITP sent to Dr. Dina Rich, Medical Director of Cardiac Rehab. Continue with ITP unless changes are made by physician.              Comments: 30 day review

## 2023-01-09 ENCOUNTER — Encounter (HOSPITAL_COMMUNITY)
Admission: RE | Admit: 2023-01-09 | Discharge: 2023-01-09 | Disposition: A | Discharge status: Home / Self Care | Payer: Medicare Other | Source: Ambulatory Visit | Attending: Surgery | Admitting: Surgery

## 2023-01-09 DIAGNOSIS — Z951 Presence of aortocoronary bypass graft: Secondary | ICD-10-CM | POA: Diagnosis not present

## 2023-01-09 DIAGNOSIS — Z953 Presence of xenogenic heart valve: Secondary | ICD-10-CM | POA: Diagnosis not present

## 2023-01-09 DIAGNOSIS — I214 Non-ST elevation (NSTEMI) myocardial infarction: Secondary | ICD-10-CM

## 2023-01-09 NOTE — Progress Notes (Signed)
Daily Session Note  Patient Details  Name: Jon Jackson MRN: [id number] Date of Birth: Dec 20, 1950 Referring Provider:   Flowsheet Row CARDIAC REHAB PHASE II ORIENTATION from 11/12/2022 in [facility] CARDIAC REHABILITATION  Referring Provider Dr. Laneta Simmers       Encounter Date: 01/09/2023  Check In:  Session Check In - 01/09/23 0900       Check-In   Supervising physician immediately available to respond to emergencies See telemetry face sheet for immediately available MD    Location [facility]    Staff Present Ross Ludwig, BS, Exercise Physiologist;Cullen Vanallen BSN, RN;Other    Virtual Visit No    Medication changes reported     No    Fall or balance concerns reported    No    Tobacco Cessation No Change    Warm-up and Cool-down Performed on first and last piece of equipment    Resistance Training Performed Yes    VAD Patient? No    PAD/SET Patient? No      Pain Assessment   Currently in Pain? No/denies    Multiple Pain Sites No             Capillary Blood Glucose: No results found for this or any previous visit (from the past 24 hour(s)).    Social History   Tobacco Use  Smoking Status Some Days   Current packs/day: 0.25   Average packs/day: 0.3 packs/day for 47.0 years (11.8 ttl pk-yrs)   Types: Cigars, Cigarettes  Smokeless Tobacco Never  Tobacco Comments   Cigars- chews on tips    Goals Met:  Independence with exercise equipment Exercise tolerated well No report of concerns or symptoms today Strength training completed today  Goals Unmet:  Not Applicable  Comments: Marland KitchenMarland KitchenPt able to follow exercise prescription today without complaint.  Will continue to monitor for progression.

## 2023-01-12 ENCOUNTER — Encounter (HOSPITAL_COMMUNITY)
Admission: RE | Admit: 2023-01-12 | Discharge: 2023-01-12 | Disposition: A | Discharge status: Home / Self Care | Payer: Medicare Other | Source: Ambulatory Visit | Attending: Surgery | Admitting: Surgery

## 2023-01-12 DIAGNOSIS — I214 Non-ST elevation (NSTEMI) myocardial infarction: Secondary | ICD-10-CM

## 2023-01-12 DIAGNOSIS — Z951 Presence of aortocoronary bypass graft: Secondary | ICD-10-CM | POA: Diagnosis not present

## 2023-01-12 DIAGNOSIS — Z953 Presence of xenogenic heart valve: Secondary | ICD-10-CM | POA: Diagnosis not present

## 2023-01-12 NOTE — Progress Notes (Signed)
Daily Session Note  Patient Details  Name: Jon Jackson MRN: [id number] Date of Birth: June 07, 1950 Referring Provider:   Flowsheet Row CARDIAC REHAB PHASE II ORIENTATION from 11/12/2022 in [facility] CARDIAC REHABILITATION  Referring Provider Dr. Laneta Simmers       Encounter Date: 01/12/2023  Check In:  Session Check In - 01/12/23 0900       Check-In   Supervising physician immediately available to respond to emergencies See telemetry face sheet for immediately available ER MD    Location [facility]    Staff Present Rodena Medin, RN, BSN;Heather Fredric Mare, BS, Exercise Physiologist    Virtual Visit No    Medication changes reported     No    Fall or balance concerns reported    No    Warm-up and Cool-down Performed on first and last piece of equipment    Resistance Training Performed Yes    VAD Patient? No    PAD/SET Patient? No      Pain Assessment   Currently in Pain? No/denies    Multiple Pain Sites No             Capillary Blood Glucose: No results found for this or any previous visit (from the past 24 hour(s)).    Social History   Tobacco Use  Smoking Status Some Days   Current packs/day: 0.25   Average packs/day: 0.3 packs/day for 47.0 years (11.8 ttl pk-yrs)   Types: Cigars, Cigarettes  Smokeless Tobacco Never  Tobacco Comments   Cigars- chews on tips    Goals Met:  Independence with exercise equipment Exercise tolerated well No report of concerns or symptoms today Strength training completed today  Goals Unmet:  Not Applicable  Comments: Pt able to follow exercise prescription today without complaint.  Will continue to monitor for progression.

## 2023-01-14 ENCOUNTER — Encounter (HOSPITAL_COMMUNITY)
Admission: RE | Admit: 2023-01-14 | Discharge: 2023-01-14 | Disposition: A | Discharge status: Home / Self Care | Payer: Medicare Other | Source: Ambulatory Visit | Attending: Surgery

## 2023-01-14 DIAGNOSIS — Z951 Presence of aortocoronary bypass graft: Secondary | ICD-10-CM | POA: Diagnosis not present

## 2023-01-14 DIAGNOSIS — Z953 Presence of xenogenic heart valve: Secondary | ICD-10-CM

## 2023-01-14 DIAGNOSIS — I214 Non-ST elevation (NSTEMI) myocardial infarction: Secondary | ICD-10-CM | POA: Diagnosis not present

## 2023-01-14 NOTE — Progress Notes (Signed)
Daily Session Note  Patient Details  Name: Jon Jackson MRN: [id number] Date of Birth: July 23, 1950 Referring Provider:   Flowsheet Row CARDIAC REHAB PHASE II ORIENTATION from 11/12/2022 in [facility] CARDIAC REHABILITATION  Referring Provider Dr. Laneta Simmers       Encounter Date: 01/14/2023  Check In:  Session Check In - 01/14/23 0938       Check-In   Supervising physician immediately available to respond to emergencies See telemetry face sheet for immediately available MD    Location [facility]    Staff Present Ross Ludwig, BS, Exercise Physiologist;Jessica Juanetta Gosling, MA, RCEP, CCRP, CCET    Virtual Visit No    Medication changes reported     No    Fall or balance concerns reported    No    Tobacco Cessation No Change    Warm-up and Cool-down Performed on first and last piece of equipment    Resistance Training Performed Yes    VAD Patient? No    PAD/SET Patient? No      Pain Assessment   Currently in Pain? No/denies    Multiple Pain Sites No             Capillary Blood Glucose: No results found for this or any previous visit (from the past 24 hour(s)).    Social History   Tobacco Use  Smoking Status Some Days   Current packs/day: 0.25   Average packs/day: 0.3 packs/day for 47.0 years (11.8 ttl pk-yrs)   Types: Cigars, Cigarettes  Smokeless Tobacco Never  Tobacco Comments   Cigars- chews on tips    Goals Met:  Independence with exercise equipment Exercise tolerated well No report of concerns or symptoms today Strength training completed today  Goals Unmet:  Not Applicable  Comments: Pt able to follow exercise prescription today without complaint.  Will continue to monitor for progression.

## 2023-01-16 ENCOUNTER — Encounter (HOSPITAL_COMMUNITY)
Admission: RE | Admit: 2023-01-16 | Discharge: 2023-01-16 | Disposition: A | Discharge status: Home / Self Care | Payer: Medicare Other | Source: Ambulatory Visit | Attending: Surgery | Admitting: Surgery

## 2023-01-16 DIAGNOSIS — I214 Non-ST elevation (NSTEMI) myocardial infarction: Secondary | ICD-10-CM

## 2023-01-16 DIAGNOSIS — Z951 Presence of aortocoronary bypass graft: Secondary | ICD-10-CM | POA: Diagnosis not present

## 2023-01-16 DIAGNOSIS — Z953 Presence of xenogenic heart valve: Secondary | ICD-10-CM | POA: Diagnosis not present

## 2023-01-16 NOTE — Progress Notes (Signed)
Daily Session Note  Patient Details  Name: SAVIAN HABETZ MRN: [id number] Date of Birth: July 09, 1950 Referring Provider:   Flowsheet Row CARDIAC REHAB PHASE II ORIENTATION from 11/12/2022 in [facility] CARDIAC REHABILITATION  Referring Provider Dr. Laneta Simmers       Encounter Date: 01/16/2023  Check In:  Session Check In - 01/16/23 0900       Check-In   Supervising physician immediately available to respond to emergencies See telemetry face sheet for immediately available MD    Location [facility]    Staff Present [facility] BSN, RN;Jessica [place], MA, RCEP, CCRP, CCET;Enid Derry, RN, BSN    Virtual Visit No    Medication changes reported     No    Fall or balance concerns reported    No    Tobacco Cessation No Change    Warm-up and Cool-down Performed on first and last piece of equipment    Resistance Training Performed Yes    VAD Patient? No    PAD/SET Patient? No      Pain Assessment   Currently in Pain? No/denies    Multiple Pain Sites No             Capillary Blood Glucose: No results found for this or any previous visit (from the past 24 hour(s)).    Social History   Tobacco Use  Smoking Status Some Days   Current packs/day: 0.25   Average packs/day: 0.3 packs/day for 47.0 years (11.8 ttl pk-yrs)   Types: Cigars, Cigarettes  Smokeless Tobacco Never  Tobacco Comments   Cigars- chews on tips    Goals Met:  Independence with exercise equipment Exercise tolerated well No report of concerns or symptoms today Strength training completed today  Goals Unmet:  Not Applicable  Comments: Marland KitchenMarland KitchenPt able to follow exercise prescription today without complaint.  Will continue to monitor for progression.

## 2023-01-19 ENCOUNTER — Encounter (HOSPITAL_COMMUNITY)
Admission: RE | Admit: 2023-01-19 | Discharge: 2023-01-19 | Disposition: A | Discharge status: Home / Self Care | Payer: Medicare Other | Source: Ambulatory Visit | Attending: Surgery | Admitting: *Deleted

## 2023-01-19 DIAGNOSIS — Z953 Presence of xenogenic heart valve: Secondary | ICD-10-CM | POA: Diagnosis not present

## 2023-01-19 DIAGNOSIS — I214 Non-ST elevation (NSTEMI) myocardial infarction: Secondary | ICD-10-CM | POA: Diagnosis not present

## 2023-01-19 DIAGNOSIS — Z951 Presence of aortocoronary bypass graft: Secondary | ICD-10-CM

## 2023-01-19 NOTE — Progress Notes (Signed)
Daily Session Note  Patient Details  Name: Jon Jackson MRN: [id number] Date of Birth: 1951/01/06 Referring Provider:   Flowsheet Row CARDIAC REHAB PHASE II ORIENTATION from 11/12/2022 in [facility] CARDIAC REHABILITATION  Referring Provider Dr. Laneta Simmers       Encounter Date: 01/19/2023  Check In:  Session Check In - 01/19/23 0933       Check-In   Supervising physician immediately available to respond to emergencies See telemetry face sheet for immediately available MD    Location [facility]    Staff Present Ross Ludwig, BS, Exercise Physiologist;Yliana Gravois Juanetta Gosling, MA, RCEP, CCRP, CCET;Phyllis Billingsley, RN    Virtual Visit No    Medication changes reported     No    Fall or balance concerns reported    No    Warm-up and Cool-down Performed on first and last piece of equipment    Resistance Training Performed Yes    VAD Patient? No    PAD/SET Patient? No      Pain Assessment   Currently in Pain? No/denies             Capillary Blood Glucose: No results found for this or any previous visit (from the past 24 hour(s)).    Social History   Tobacco Use  Smoking Status Some Days   Current packs/day: 0.25   Average packs/day: 0.3 packs/day for 47.0 years (11.8 ttl pk-yrs)   Types: Cigars, Cigarettes  Smokeless Tobacco Never  Tobacco Comments   Cigars- chews on tips    Goals Met:  Independence with exercise equipment Exercise tolerated well No report of concerns or symptoms today Strength training completed today  Goals Unmet:  Not Applicable  Comments: Pt able to follow exercise prescription today without complaint.  Will continue to monitor for progression.

## 2023-01-20 DIAGNOSIS — Z85828 Personal history of other malignant neoplasm of skin: Secondary | ICD-10-CM | POA: Diagnosis not present

## 2023-01-20 DIAGNOSIS — D485 Neoplasm of uncertain behavior of skin: Secondary | ICD-10-CM | POA: Diagnosis not present

## 2023-01-20 DIAGNOSIS — L570 Actinic keratosis: Secondary | ICD-10-CM | POA: Diagnosis not present

## 2023-01-20 DIAGNOSIS — Z1283 Encounter for screening for malignant neoplasm of skin: Secondary | ICD-10-CM | POA: Diagnosis not present

## 2023-01-21 ENCOUNTER — Encounter (HOSPITAL_COMMUNITY)
Admission: RE | Admit: 2023-01-21 | Discharge: 2023-01-21 | Disposition: A | Discharge status: Home / Self Care | Payer: Medicare Other | Source: Ambulatory Visit | Attending: Surgery

## 2023-01-21 DIAGNOSIS — Z951 Presence of aortocoronary bypass graft: Secondary | ICD-10-CM

## 2023-01-21 DIAGNOSIS — Z953 Presence of xenogenic heart valve: Secondary | ICD-10-CM | POA: Diagnosis not present

## 2023-01-21 DIAGNOSIS — I214 Non-ST elevation (NSTEMI) myocardial infarction: Secondary | ICD-10-CM

## 2023-01-21 NOTE — Progress Notes (Signed)
Daily Session Note  Patient Details  Name: TAIGE DESANTIS MRN: [id number] Date of Birth: 1950/06/27 Referring Provider:   Flowsheet Row CARDIAC REHAB PHASE II ORIENTATION from 11/12/2022 in [facility] CARDIAC REHABILITATION  Referring Provider Dr. Laneta Simmers       Encounter Date: 01/21/2023  Check In:  Session Check In - 01/21/23 0942       Check-In   Supervising physician immediately available to respond to emergencies See telemetry face sheet for immediately available MD    Location [facility]    Staff Present Ross Ludwig, BS, Exercise Physiologist;Jevaughn Degollado Juanetta Gosling, MA, RCEP, CCRP, CCET;Hillary Troutman BSN, RN    Virtual Visit No    Medication changes reported     No    Fall or balance concerns reported    No    Warm-up and Cool-down Performed on first and last piece of equipment    Resistance Training Performed Yes    VAD Patient? No    PAD/SET Patient? No      Pain Assessment   Currently in Pain? No/denies             Capillary Blood Glucose: No results found for this or any previous visit (from the past 24 hour(s)).    Social History   Tobacco Use  Smoking Status Some Days   Current packs/day: 0.25   Average packs/day: 0.3 packs/day for 47.0 years (11.8 ttl pk-yrs)   Types: Cigars, Cigarettes  Smokeless Tobacco Never  Tobacco Comments   Cigars- chews on tips    Goals Met:  Independence with exercise equipment Exercise tolerated well No report of concerns or symptoms today Strength training completed today  Goals Unmet:  Not Applicable  Comments: Pt able to follow exercise prescription today without complaint.  Will continue to monitor for progression.

## 2023-01-23 ENCOUNTER — Encounter (HOSPITAL_COMMUNITY)
Admission: RE | Admit: 2023-01-23 | Discharge: 2023-01-23 | Disposition: A | Discharge status: Home / Self Care | Payer: Medicare Other | Source: Ambulatory Visit | Attending: Surgery

## 2023-01-23 DIAGNOSIS — Z953 Presence of xenogenic heart valve: Secondary | ICD-10-CM

## 2023-01-23 DIAGNOSIS — I214 Non-ST elevation (NSTEMI) myocardial infarction: Secondary | ICD-10-CM

## 2023-01-23 DIAGNOSIS — Z951 Presence of aortocoronary bypass graft: Secondary | ICD-10-CM

## 2023-01-23 NOTE — Progress Notes (Signed)
Daily Session Note  Patient Details  Name: Jon Jackson MRN: [id number] Date of Birth: 1951/03/31 Referring Provider:   Flowsheet Row CARDIAC REHAB PHASE II ORIENTATION from 11/12/2022 in [facility] CARDIAC REHABILITATION  Referring Provider Dr. Laneta Simmers       Encounter Date: 01/23/2023  Check In:  Session Check In - 01/23/23 0915       Check-In   Supervising physician immediately available to respond to emergencies See telemetry face sheet for immediately available MD    Location [facility]    Staff Present [facility] BSN, RN;Jessica [place], [place], RCEP, CCRP, CCET    Virtual Visit No    Medication changes reported     No    Fall or balance concerns reported    No    Tobacco Cessation No Change    Warm-up and Cool-down Performed on first and last piece of equipment    Resistance Training Performed Yes    VAD Patient? No    PAD/SET Patient? No      Pain Assessment   Currently in Pain? No/denies    Multiple Pain Sites No             Capillary Blood Glucose: No results found for this or any previous visit (from the past 24 hour(s)).    Social History   Tobacco Use  Smoking Status Some Days   Current packs/day: 0.25   Average packs/day: 0.3 packs/day for 47.0 years (11.8 ttl pk-yrs)   Types: Cigars, Cigarettes  Smokeless Tobacco Never  Tobacco Comments   Cigars- chews on tips    Goals Met:  Independence with exercise equipment Exercise tolerated well No report of concerns or symptoms today Strength training completed today  Goals Unmet:  Not Applicable  Comments: Marland KitchenMarland KitchenPt able to follow exercise prescription today without complaint.  Will continue to monitor for progression.

## 2023-01-26 ENCOUNTER — Encounter (HOSPITAL_COMMUNITY)
Admission: RE | Admit: 2023-01-26 | Discharge: 2023-01-26 | Disposition: A | Discharge status: Home / Self Care | Payer: Medicare Other | Source: Ambulatory Visit | Attending: Surgery | Admitting: *Deleted

## 2023-01-26 VITALS — Ht 70.0 in | Wt 218.0 lb

## 2023-01-26 DIAGNOSIS — Z953 Presence of xenogenic heart valve: Secondary | ICD-10-CM

## 2023-01-26 DIAGNOSIS — Z951 Presence of aortocoronary bypass graft: Secondary | ICD-10-CM

## 2023-01-26 DIAGNOSIS — I214 Non-ST elevation (NSTEMI) myocardial infarction: Secondary | ICD-10-CM

## 2023-01-26 NOTE — Patient Instructions (Addendum)
Discharge Patient Instructions  Patient Details  Name: Jon Jackson MRN: [id number] Date of Birth: 08-May-1950 Referring Provider:  Tommie Sams, DO   Number of Visits: 78  Reason for Discharge:  Patient reached a stable level of exercise. Patient independent in their exercise. Patient has met program and personal goals.  Smoking History:  Social History   Tobacco Use  Smoking Status Some Days   Current packs/day: 0.25   Average packs/day: 0.3 packs/day for 47.0 years (11.8 ttl pk-yrs)   Types: Cigars, Cigarettes  Smokeless Tobacco Never  Tobacco Comments   Cigars- chews on tips    Diagnosis:  NSTEMI (non-ST elevated myocardial infarction) (HCC)  S/P CABG x 1  S/P aortic valve replacement with bioprosthetic valve  Initial Exercise Prescription:  Initial Exercise Prescription - 11/12/22 1600       Date of Initial Exercise RX and Referring Provider   Date 11/12/22    Referring Provider Dr. Laneta Simmers      Oxygen   Maintain Oxygen Saturation 88% or higher      Treadmill   MPH 2    Grade 0.5    Minutes 15      REL-XR   Level 2    Speed 50    Minutes 15      Prescription Details   Frequency (times per week) 3    Duration Progress to 30 minutes of continuous aerobic without signs/symptoms of physical distress      Intensity   THRR 40-80% of Max Heartrate 97-131    Ratings of Perceived Exertion 11-13      Resistance Training   Training Prescription Yes    Weight 4    Reps 10-15             Discharge Exercise Prescription (Final Exercise Prescription Changes):  Exercise Prescription Changes - 01/12/23 1000       Response to Exercise   Blood Pressure (Admit) 170/80    Blood Pressure (Exit) 130/64    Heart Rate (Admit) 103 bpm    Heart Rate (Exercise) 102 bpm    Heart Rate (Exit) 89 bpm    Rating of Perceived Exertion (Exercise) 13    Duration Continue with 30 min of aerobic exercise without signs/symptoms of physical distress.    Intensity  THRR unchanged      Progression   Progression Continue to progress workloads to maintain intensity without signs/symptoms of physical distress.      Resistance Training   Training Prescription Yes    Weight 5lbs    Reps 10-15      Treadmill   MPH 2.3    Grade 3    Minutes 15    METs 3.71      NuStep   Level 3    SPM 77    Minutes 15    METs 2      Home Exercise Plan   Plans to continue exercise at Home (comment)    Frequency Add 2 additional days to program exercise sessions.      Oxygen   Maintain Oxygen Saturation 88% or higher             Functional Capacity:  6 Minute Walk     Row Name 11/12/22 1603 01/26/23 0942       6 Minute Walk   Phase Initial Discharge    Distance 1600 feet 1510 feet  initial recalculated to 1440 ft    Distance % Change -- 4.9 %  Distance Feet Change -- 70 ft    Walk Time 6 minutes 6 minutes    # of Rest Breaks 0 0    MPH 3.03 2.86    METS 3.11 3.13    RPE 11 13    VO2 Peak 10.9 10.97    Symptoms No No    Resting HR 63 bpm 75 bpm    Resting BP 160/70 120/64    Resting Oxygen Saturation  96 % --    Exercise Oxygen Saturation  during 6 min walk 96 % --    Max Ex. HR 77 bpm 94 bpm    Max Ex. BP 156/68 162/62    2 Minute Post BP 146/64 --            Nutrition & Weight - Outcomes:  Pre Biometrics - 11/12/22 1607       Pre Biometrics   Height 5\' 10"  (1.778 m)    Weight 95.3 kg    Waist Circumference 42 inches    Hip Circumference 41 inches    Waist to Hip Ratio 1.02 %    BMI (Calculated) 30.15    Grip Strength 22.9 kg    Flexibility 0 in    Single Leg Stand 21.64 seconds             Post Biometrics - 01/26/23 0943        Post  Biometrics   Height 5\' 10"  (1.778 m)    Weight 98.9 kg    Waist Circumference 40 inches    Hip Circumference 42 inches    Waist to Hip Ratio 0.95 %    BMI (Calculated) 31.28    Grip Strength 27 kg    Single Leg Stand 30 seconds

## 2023-01-26 NOTE — Progress Notes (Signed)
Daily Session Note  Patient Details  Name: Jon Jackson MRN: [id number] Date of Birth: December 09, 1950 Referring Provider:   Flowsheet Row CARDIAC REHAB PHASE II ORIENTATION from 11/12/2022 in [facility] CARDIAC REHABILITATION  Referring Provider Dr. Laneta Simmers       Encounter Date: 01/26/2023  Check In:  Session Check In - 01/26/23 0942       Check-In   Supervising physician immediately available to respond to emergencies See telemetry face sheet for immediately available MD    Location [facility]    Staff Present Fabio Pierce, MA, RCEP, CCRP, CCET    Staff Present Cyndia Diver, RN, BSN, MA    Virtual Visit No    Medication changes reported     No    Fall or balance concerns reported    No    Warm-up and Cool-down Performed on first and last piece of equipment    Resistance Training Performed Yes    VAD Patient? No    PAD/SET Patient? No      Pain Assessment   Currently in Pain? No/denies             Capillary Blood Glucose: No results found for this or any previous visit (from the past 24 hour(s)).    Social History   Tobacco Use  Smoking Status Some Days   Current packs/day: 0.25   Average packs/day: 0.3 packs/day for 47.0 years (11.8 ttl pk-yrs)   Types: Cigars, Cigarettes  Smokeless Tobacco Never  Tobacco Comments   Cigars- chews on tips    Goals Met:  Independence with exercise equipment Exercise tolerated well No report of concerns or symptoms today Strength training completed today  Goals Unmet:  Not Applicable  Comments: Pt able to follow exercise prescription today without complaint.  Will continue to monitor for progression.  6 Minute Walk     Row Name 11/12/22 1603 01/26/23 0942       6 Minute Walk   Phase Initial Discharge    Distance 1600 feet 1510 feet  initial recalculated to 1440 ft    Distance % Change -- 4.9 %    Distance Feet Change -- 70 ft    Walk Time 6 minutes 6 minutes    # of Rest Breaks 0 0     MPH 3.03 2.86    METS 3.11 3.13    RPE 11 13    VO2 Peak 10.9 10.97    Symptoms No No    Resting HR 63 bpm 75 bpm    Resting BP 160/70 120/64    Resting Oxygen Saturation  96 % --    Exercise Oxygen Saturation  during 6 min walk 96 % --    Max Ex. HR 77 bpm 94 bpm    Max Ex. BP 156/68 162/62    2 Minute Post BP 146/64 --

## 2023-01-28 ENCOUNTER — Encounter (HOSPITAL_COMMUNITY): Payer: Medicare Other

## 2023-01-30 ENCOUNTER — Encounter (HOSPITAL_COMMUNITY)
Admission: RE | Admit: 2023-01-30 | Discharge: 2023-01-30 | Disposition: A | Discharge status: Home / Self Care | Payer: Medicare Other | Source: Ambulatory Visit | Attending: Surgery | Admitting: Surgery

## 2023-01-30 DIAGNOSIS — Z953 Presence of xenogenic heart valve: Secondary | ICD-10-CM | POA: Diagnosis not present

## 2023-01-30 DIAGNOSIS — Z951 Presence of aortocoronary bypass graft: Secondary | ICD-10-CM

## 2023-01-30 DIAGNOSIS — I214 Non-ST elevation (NSTEMI) myocardial infarction: Secondary | ICD-10-CM | POA: Diagnosis not present

## 2023-01-30 NOTE — Progress Notes (Signed)
Daily Session Note  Patient Details  Name: Jon Jackson MRN: [id number] Date of Birth: [birth year] Referring Provider:   Flowsheet Row CARDIAC REHAB PHASE II ORIENTATION from 11/12/2022 in [facility] CARDIAC REHABILITATION  Referring Provider Dr. Laneta Simmers       Encounter Date: 01/30/2023  Check In:  Session Check In - 01/30/23 0915       Check-In   Supervising physician immediately available to respond to emergencies See telemetry face sheet for immediately available MD    Location [facility]    Staff Present Ross Ludwig, BS, Exercise Physiologist;Jessica Juanetta Gosling, MA, RCEP, CCRP, Dow Adolph, RN, BSN    Virtual Visit No    Medication changes reported     No    Fall or balance concerns reported    No    Tobacco Cessation No Change    Warm-up and Cool-down Performed on first and last piece of equipment    Resistance Training Performed Yes    VAD Patient? No    PAD/SET Patient? No      Pain Assessment   Currently in Pain? No/denies    Multiple Pain Sites No             Capillary Blood Glucose: No results found for this or any previous visit (from the past 24 hour(s)).    Social History   Tobacco Use  Smoking Status Some Days   Current packs/day: 0.25   Average packs/day: 0.3 packs/day for 47.0 years (11.8 ttl pk-yrs)   Types: Cigars, Cigarettes  Smokeless Tobacco Never  Tobacco Comments   Cigars- chews on tips    Goals Met:  Independence with exercise equipment Exercise tolerated well No report of concerns or symptoms today Strength training completed today  Goals Unmet:  Not Applicable  Comments: Pt able to follow exercise prescription today without complaint.  Will continue to monitor for progression.

## 2023-02-02 ENCOUNTER — Encounter (HOSPITAL_COMMUNITY): Payer: Medicare Other

## 2023-02-02 ENCOUNTER — Encounter: Payer: Self-pay | Admitting: Cardiovascular Disease

## 2023-02-02 ENCOUNTER — Ambulatory Visit: Payer: Medicare Other | Attending: Cardiovascular Disease | Admitting: Cardiovascular Disease

## 2023-02-02 VITALS — BP 180/88 | HR 76 | Ht 70.0 in | Wt 220.0 lb

## 2023-02-02 DIAGNOSIS — E785 Hyperlipidemia, unspecified: Secondary | ICD-10-CM | POA: Insufficient documentation

## 2023-02-02 DIAGNOSIS — I2510 Atherosclerotic heart disease of native coronary artery without angina pectoris: Secondary | ICD-10-CM | POA: Diagnosis not present

## 2023-02-02 DIAGNOSIS — Z953 Presence of xenogenic heart valve: Secondary | ICD-10-CM | POA: Insufficient documentation

## 2023-02-02 DIAGNOSIS — I4891 Unspecified atrial fibrillation: Secondary | ICD-10-CM | POA: Diagnosis not present

## 2023-02-02 DIAGNOSIS — I10 Essential (primary) hypertension: Secondary | ICD-10-CM | POA: Insufficient documentation

## 2023-02-02 MED ORDER — AMOXICILLIN 500 MG PO TABS: ORAL_TABLET | ORAL | 1 refills | Status: DC

## 2023-02-02 NOTE — Patient Instructions (Signed)
Medication Instructions:  1.) amoxicillin 500 mg tablets --take 2000 mg (FOUR TABS) one hour before any dental appointments including cleanings  *If you need a refill on your cardiac medications before your next appointment, please call your pharmacy*   Lab Work: Lipids/liver function tests today  If you have labs (blood work) drawn today and your tests are completely normal, you will receive your results only by: MyChart Message (if you have MyChart) OR A paper copy in the mail If you have any lab test that is abnormal or we need to change your treatment, we will call you to review the results.   Testing/Procedures: none   Follow-Up: At [facility], you and your health needs are our priority.  As part of our continuing mission to provide you with exceptional heart care, we have created designated Provider Care Teams.  These Care Teams include your primary Cardiologist (physician) and Advanced Practice Providers (APPs -  Physician Assistants and Nurse Practitioners) who all work together to provide you with the care you need, when you need it.   Your next appointment:   12 month(s)  Provider:   Verne Carrow, MD

## 2023-02-02 NOTE — Progress Notes (Signed)
Chief Complaint  Patient presents with   Follow-up    CAD, aortic valve disease   History of Present Illness: 72 yo male with history of CAD s/p 1V CABG, severe aortic stenosis s/p surgical AVR, atrial fibrillation s/p ablation, HLD, HTN, GERD, anxiety here today for follow up. He has been followed in our office by Dr. Diona Browner and in the EP clinic by Dr. Elberta Fortis. He had  atrial fib ablation in 2018. He was admitted to [facility] in April 2018 with a NSTEMI and was found to have severe aortic stenosis and a severe stenosis in the RCA. He then underwent single vessel bypass of the RCA and surgical AVR with a bioprosthetic AVR.   He is here today for follow up. The patient denies any chest pain, dyspnea, palpitations, lower extremity edema, orthopnea, PND, dizziness, near syncope or syncope.   Primary Care Physician: Tommie Sams, DO   Past Medical History:  Diagnosis Date   Anemia    as a child   Anxiety    Arthritis    Atrial fibrillation (HCC)    Basal cell carcinoma    head   Coronary artery disease    Depression    Dysrhythmia    GERD (gastroesophageal reflux disease)    Heart murmur    Hyperlipidemia    Myocardial infarction (HCC) 10/02/2022   Pneumonia 02/2022   Squamous carcinoma     Past Surgical History:  Procedure Laterality Date   AORTIC VALVE REPLACEMENT N/A 10/10/2022   Procedure: AORTIC VALVE REPLACEMENT USING 25 MM INSPIRIS AORTIC VALVE;  Surgeon: Alleen Borne, MD;  Location: [facility] OR;  Service: Open Heart Surgery;  Laterality: N/A;   ATRIAL FIBRILLATION ABLATION  07/22/2016   ATRIAL FIBRILLATION ABLATION N/A 07/22/2016   Procedure: Atrial Fibrillation Ablation;  Surgeon: Will Jorja Loa, MD;  Location: [facility] INVASIVE CV LAB;  Service: Cardiovascular;  Laterality: N/A;   BASAL CELL CARCINOMA EXCISION     forehead; top of scalp   BIOPSY  05/27/2017   Procedure: BIOPSY;  Surgeon: Corbin Ade, MD;  Location: [facility];  Service: Endoscopy;;  right colon,  left colon;   CARDIOVERSION N/A 04/07/2016   Procedure: CARDIOVERSION;  Surgeon: Jonelle Sidle, MD;  Location: [facility] ORS;  Service: Cardiovascular;  Laterality: N/A;   COLECTOMY  1990s   removal of section due to strangulated bowel.   COLONOSCOPY N/A 05/30/2013   Procedure: COLONOSCOPY;  Surgeon: Corbin Ade, MD;  Location: [facility];  Service: Endoscopy;  Laterality: N/A;  8:45 AM   COLONOSCOPY     COLONOSCOPY N/A 05/27/2017   Procedure: COLONOSCOPY;  Surgeon: Corbin Ade, MD;  Location: [facility];  Service: Endoscopy;  Laterality: N/A;  1:00pm   CORONARY ARTERY BYPASS GRAFT N/A 10/10/2022   Procedure: CORONARY ARTERY BYPASS GRAFTING (CABG) X 1 USING ENDOSCOPICALY HARVESTED RIGHT GREATER SAPHENOUS VEIN;  Surgeon: Alleen Borne, MD;  Location: [facility] OR;  Service: Open Heart Surgery;  Laterality: N/A;   INGUINAL HERNIA REPAIR Right 1990s   LUMBAR LAMINECTOMY/DECOMPRESSION MICRODISCECTOMY N/A 12/29/2018   Procedure: Decompressive lumbar laminectomy at L3-L4;  Surgeon: Ranee Gosselin, MD;  Location: [facility] ORS;  Service: Orthopedics;  Laterality: N/A;    RIGHT HEART CATH AND CORONARY ANGIOGRAPHY N/A 10/03/2022   Procedure: RIGHT HEART CATH AND CORONARY ANGIOGRAPHY;  Surgeon: Orpah Cobb, MD;  Location: [facility];  Service: Cardiovascular;  Laterality: N/A;   RIGHT/LEFT HEART CATH AND CORONARY ANGIOGRAPHY N/A 10/03/2022  Procedure: RIGHT/LEFT HEART CATH AND CORONARY ANGIOGRAPHY;  Surgeon: Orpah Cobb, MD;  Location: [facility];  Service: Cardiovascular;  Laterality: N/A;   SHOULDER ARTHROSCOPY WITH ROTATOR CUFF REPAIR AND SUBACROMIAL DECOMPRESSION Left 05/15/2016   Procedure: LEFT SHOULDER ARTHROSCOPY WITH ROTATOR CUFF REPAIR AND SUBACROMIAL DECOMPRESSION AND DISTAL CLAVICLE RESECTION;  Surgeon: Francena Hanly, MD;  Location: [facility] OR;  Service: Orthopedics;  Laterality: Left;  requests 2hrs   SHOULDER SURGERY Right    SQUAMOUS CELL CARCINOMA EXCISION     scalp   TEE  WITHOUT CARDIOVERSION N/A 10/10/2022   Procedure: TRANSESOPHAGEAL ECHOCARDIOGRAM;  Surgeon: Alleen Borne, MD;  Location: [facility];  Service: Open Heart Surgery;  Laterality: N/A;   TONSILLECTOMY      Current Outpatient Medications  Medication Sig Dispense Refill   amoxicillin (AMOXIL) 500 MG tablet Take 2000 mg (FOUR TABLETS) by mouth one hour before any dental appointments including cleanings 12 tablet 1   aspirin EC 325 MG tablet Take 1 tablet (325 mg total) by mouth daily.     atorvastatin (LIPITOR) 80 MG tablet Take 1 tablet (80 mg total) by mouth daily. 90 tablet 3   citalopram (CELEXA) 20 MG tablet Take 1 tablet (20 mg total) by mouth daily. 90 tablet 1   Multiple Vitamin (MULTIVITAMIN) tablet Take 1 tablet by mouth daily.     omeprazole (PRILOSEC) 40 MG capsule Take 1 capsule (40 mg total) by mouth daily. 90 capsule 1   Fe Fum-Vit C-Vit B12-FA (TRIGELS-F FORTE) CAPS capsule Take 1 capsule by mouth daily after breakfast. 30 capsule 2   losartan (COZAAR) 50 MG tablet Take 1 tablet (50 mg total) by mouth daily. 90 tablet 1   No current facility-administered medications for this visit.    Allergies  Allergen Reactions   Ultram [Tramadol] Other (See Comments)    Head felt funny    Social History   Socioeconomic History   Marital status: Married    Spouse name: Not on file   Number of children: Not on file   Years of education: Not on file   Highest education level: Not on file  Occupational History   Not on file  Tobacco Use   Smoking status: Some Days    Current packs/day: 0.25    Average packs/day: 0.3 packs/day for 47.0 years (11.8 ttl pk-yrs)    Types: Cigars, Cigarettes   Smokeless tobacco: Never   Tobacco comments:    Cigars- chews on tips  Vaping Use   Vaping status: Never Used  Substance and Sexual Activity   Alcohol use: Yes    Alcohol/week: 12.0 standard drinks of alcohol    Types: 12 Cans of beer per week    Comment: daily 3-4 in the summer; soemtimes more  on weekends.   Drug use: No   Sexual activity: Yes  Other Topics Concern   Not on file  Social History Narrative   Not on file   Social Determinants of Health   Financial Resource Strain: Low Risk  (05/09/2022)   Overall Financial Resource Strain (CARDIA)    Difficulty of Paying Living Expenses: Not hard at all  Food Insecurity: No Food Insecurity (10/16/2022)   Hunger Vital Sign    Worried About Running Out of Food in the Last Year: Never true    Ran Out of Food in the Last Year: Never true  Transportation Needs: No Transportation Needs (10/16/2022)   PRAPARE - Administrator, Civil Service (Medical): No  Lack of Transportation (Non-Medical): No  Physical Activity: Sufficiently Active (05/09/2022)   Exercise Vital Sign    Days of Exercise per Week: 7 days    Minutes of Exercise per Session: 30 min  Stress: No Stress Concern Present (05/09/2022)   [employer] of Occupational Health - Occupational Stress Questionnaire    Feeling of Stress : Not at all  Social Connections: Moderately Isolated (10/02/2022)   Social Connection and Isolation Panel [NHANES]    Frequency of Communication with Friends and Family: More than three times a week    Frequency of Social Gatherings with Friends and Family: More than three times a week    Attends Religious Services: Never    Database administrator or Organizations: No    Attends Banker Meetings: Never    Marital Status: Married  Catering manager Violence: Not At Risk (10/13/2022)   Humiliation, Afraid, Rape, and Kick questionnaire    Fear of Current or Ex-Partner: No    Emotionally Abused: No    Physically Abused: No    Sexually Abused: No    Family History  Problem Relation Age of Onset   Colon cancer Brother 73   Throat cancer Brother    Breast cancer Mother     Review of Systems:  As stated in the HPI and otherwise negative.   BP (!) 180/88   Pulse 76   Ht 5\' 10"  (1.778 m)   Wt 99.8 kg   SpO2 95%    BMI 31.57 kg/m   Physical Examination: General: Well developed, well nourished, NAD  HEENT: OP clear, mucus membranes moist  SKIN: warm, dry. No rashes. Neuro: No focal deficits  Musculoskeletal: Muscle strength 5/5 all ext  Psychiatric: Mood and affect normal  Neck: No JVD, no carotid bruits, no thyromegaly, no lymphadenopathy.  Lungs:Clear bilaterally, no wheezes, rhonci, crackles Cardiovascular: Regular rate and rhythm. No murmurs, gallops or rubs. Abdomen:Soft. Bowel sounds present. Non-tender.  Extremities: No lower extremity edema. Pulses are 2 + in the bilateral DP/PT.  EKG:  EKG is not ordered today. The ekg ordered today demonstrates   Recent Labs: 10/09/2022: ALT 26 10/11/2022: Magnesium 2.8 10/13/2022: BUN 18; Creatinine, Ser 1.07; Potassium 4.1; Sodium 136 11/18/2022: Hemoglobin 13.5; Platelets 287   Lipid Panel    Component Value Date/Time   CHOL 144 11/18/2022 1115   TRIG 103 11/18/2022 1115   HDL 45 11/18/2022 1115   CHOLHDL 3.2 11/18/2022 1115   CHOLHDL 4.0 10/03/2022 0710   VLDL 15 10/03/2022 0710   LDLCALC 80 11/18/2022 1115     Wt Readings from Last 3 Encounters:  02/02/23 99.8 kg  01/26/23 98.9 kg  12/19/22 96 kg    Assessment and Plan:   1. CAD s/p CABG: No chest pain. Continue ASA and statin.   2. Severe aortic stenosis s/p AVR: Continue ASA. SBE prophylaxis when indicated.   3. Atrial fibrillation, paroxysmal: Followed in the EP clinic. He has not had recurrence since his ablation in 2018 and is not on anti-coagulation.   4. HTN: BP is controlled at home. No changes  5. Hyperlipidemia: LDL 80 in August 2024. Lipitor was increased. Will check lipids and LFTs today.   Labs/ tests ordered today include:   Orders Placed This Encounter  Procedures   Lipid panel   Hepatic function panel   Disposition:   F/U with me in one year   Signed, Verne Carrow, MD, [facility] 02/02/2023 10:49 AM    [facility]  [address], [place], [place]  [address] Phone: [phone]; Fax: [phone]

## 2023-02-03 LAB — HEPATIC FUNCTION PANEL
ALT: 26 [IU]/L (ref 0–44)
AST: 21 [IU]/L (ref 0–40)
Albumin: 4.2 g/dL (ref 3.8–4.8)
Alkaline Phosphatase: 113 [IU]/L (ref 44–121)
Bilirubin Total: 0.3 mg/dL (ref 0.0–1.2)
Bilirubin, Direct: 0.1 mg/dL (ref 0.00–0.40)
Total Protein: 6.1 g/dL (ref 6.0–8.5)

## 2023-02-03 LAB — LIPID PANEL
Chol/HDL Ratio: 2.8 ratio (ref 0.0–5.0)
Cholesterol, Total: 135 mg/dL (ref 100–199)
HDL: 49 mg/dL (ref >39)
LDL Chol Calc (NIH): 71 mg/dL (ref 0–99)
Triglycerides: 77 mg/dL (ref 0–149)
VLDL Cholesterol Cal: 15 mg/dL (ref 5–40)

## 2023-02-04 ENCOUNTER — Encounter (HOSPITAL_COMMUNITY): Payer: Medicare Other

## 2023-02-04 ENCOUNTER — Encounter (HOSPITAL_COMMUNITY): Payer: Self-pay | Admitting: *Deleted

## 2023-02-04 DIAGNOSIS — I214 Non-ST elevation (NSTEMI) myocardial infarction: Secondary | ICD-10-CM

## 2023-02-04 DIAGNOSIS — Z953 Presence of xenogenic heart valve: Secondary | ICD-10-CM

## 2023-02-04 DIAGNOSIS — Z951 Presence of aortocoronary bypass graft: Secondary | ICD-10-CM

## 2023-02-04 NOTE — Progress Notes (Signed)
Cardiac Individual Treatment Plan  Patient Details  Name: Jon Jackson MRN: [id number] Date of Birth: 02-May-1950 Referring Provider:   Flowsheet Row CARDIAC REHAB PHASE II ORIENTATION from 11/12/2022 in [facility] CARDIAC REHABILITATION  Referring Provider Dr. Laneta Simmers       Initial Encounter Date:  Flowsheet Row CARDIAC REHAB PHASE II ORIENTATION from 11/12/2022 in [facility] CARDIAC REHABILITATION  Date 11/12/22       Visit Diagnosis: NSTEMI (non-ST elevated myocardial infarction) (HCC)  S/P CABG x 1  S/P aortic valve replacement with bioprosthetic valve  Patient's Home Medications on Admission:  Current Outpatient Medications:    amoxicillin (AMOXIL) 500 MG tablet, Take 2000 mg (FOUR TABLETS) by mouth one hour before any dental appointments including cleanings, Disp: 12 tablet, Rfl: 1   aspirin EC 325 MG tablet, Take 1 tablet (325 mg total) by mouth daily., Disp: , Rfl:    atorvastatin (LIPITOR) 80 MG tablet, Take 1 tablet (80 mg total) by mouth daily., Disp: 90 tablet, Rfl: 3   citalopram (CELEXA) 20 MG tablet, Take 1 tablet (20 mg total) by mouth daily., Disp: 90 tablet, Rfl: 1   Fe Fum-Vit C-Vit B12-FA (TRIGELS-F FORTE) CAPS capsule, Take 1 capsule by mouth daily after breakfast., Disp: 30 capsule, Rfl: 2   losartan (COZAAR) 50 MG tablet, Take 1 tablet (50 mg total) by mouth daily., Disp: 90 tablet, Rfl: 1   Multiple Vitamin (MULTIVITAMIN) tablet, Take 1 tablet by mouth daily., Disp: , Rfl:    omeprazole (PRILOSEC) 40 MG capsule, Take 1 capsule (40 mg total) by mouth daily., Disp: 90 capsule, Rfl: 1  Past Medical History: Past Medical History:  Diagnosis Date   Anemia    as a child   Anxiety    Arthritis    Atrial fibrillation (HCC)    Basal cell carcinoma    head   Coronary artery disease    Depression    Dysrhythmia    GERD (gastroesophageal reflux disease)    Heart murmur    Hyperlipidemia    Myocardial infarction (HCC) 10/02/2022   Pneumonia 02/2022    Squamous carcinoma     Tobacco Use: Social History   Tobacco Use  Smoking Status Some Days   Current packs/day: 0.25   Average packs/day: 0.3 packs/day for 47.0 years (11.8 ttl pk-yrs)   Types: Cigars, Cigarettes  Smokeless Tobacco Never  Tobacco Comments   Cigars- chews on tips    Labs: Review Flowsheet  More data exists      Latest Ref Rng & Units 10/03/2022 10/09/2022 10/10/2022 11/18/2022 02/02/2023  Labs for ITP Cardiac and Pulmonary Rehab  Cholestrol 100 - 199 mg/dL 478  - - 295  621   LDL (calc) 0 - 99 mg/dL 308  - - 80  71   HDL-C >39 mg/dL 47  - - 45  49   Trlycerides 0 - 149 mg/dL 75  - - 657  77   Hemoglobin A1c 4.8 - 5.6 % - 5.2  - - -  PH, Arterial 7.35 - 7.45 7.393  7.41  7.424  7.313  7.253  7.327  7.390  7.355  7.303  - -  PCO2 arterial 32 - 48 mmHg 36.6  38  29.8  46.0  52.1  45.9  40.1  42.5  51.8  - -  Bicarbonate 20.0 - 28.0 mmol/L 22.3  24.9  25.0  24.1  19.7  23.3  23.0  24.0  24.2  31.1  23.8  25.7  - -  TCO2 22 - 32 mmol/L 23  26  26   - 21  25  25  25  26  26  25  27   33  25  28  27  28   - -  Acid-base deficit 0.0 - 2.0 mmol/L 2.0  1.0  1.0  0.3  4.0  3.0  5.0  2.0  1.0  2.0  1.0  - -  O2 Saturation % 95  73  76  99.4  99  99  96  100  100  81  100  100  - -    Details       Multiple values from one day are sorted in reverse-chronological order         Capillary Blood Glucose: Lab Results  Component Value Date   GLUCAP 106 (H) 10/14/2022   GLUCAP 112 (H) 10/13/2022   GLUCAP 106 (H) 10/13/2022   GLUCAP 85 10/13/2022   GLUCAP 90 10/13/2022     Exercise Target Goals: Exercise Program Goal: Individual exercise prescription set using results from initial 6 min walk test and THRR while considering  patient's activity barriers and safety.   Exercise Prescription Goal: Starting with aerobic activity 30 plus minutes a day, 3 days per week for initial exercise prescription. Provide home exercise prescription and guidelines that participant  acknowledges understanding prior to discharge.  Activity Barriers & Risk Stratification:  Activity Barriers & Cardiac Risk Stratification - 11/12/22 1413       Activity Barriers & Cardiac Risk Stratification   Activity Barriers Other (comment)    Comments Patient Jackson limited mobility in his left arm due to old shoulder injury repaired with surgery.    Cardiac Risk Stratification High             6 Minute Walk:  6 Minute Walk     Row Name 11/12/22 1603 01/26/23 0942       6 Minute Walk   Phase Initial Discharge    Distance 1600 feet 1510 feet  initial recalculated to 1440 ft    Distance % Change -- 4.9 %    Distance Feet Change -- 70 ft    Walk Time 6 minutes 6 minutes    # of Rest Breaks 0 0    MPH 3.03 2.86    METS 3.11 3.13    RPE 11 13    VO2 Peak 10.9 10.97    Symptoms No No    Resting HR 63 bpm 75 bpm    Resting BP 160/70 120/64    Resting Oxygen Saturation  96 % --    Exercise Oxygen Saturation  during 6 min walk 96 % --    Max Ex. HR 77 bpm 94 bpm    Max Ex. BP 156/68 162/62    2 Minute Post BP 146/64 --             Oxygen Initial Assessment:   Oxygen Re-Evaluation:   Oxygen Discharge (Final Oxygen Re-Evaluation):   Initial Exercise Prescription:  Initial Exercise Prescription - 11/12/22 1600       Date of Initial Exercise RX and Referring Provider   Date 11/12/22    Referring Provider Dr. Laneta Simmers      Oxygen   Maintain Oxygen Saturation 88% or higher      Treadmill   MPH 2    Grade 0.5    Minutes 15      REL-XR   Level 2    Speed 50  Minutes 15      Prescription Details   Frequency (times per week) 3    Duration Progress to 30 minutes of continuous aerobic without signs/symptoms of physical distress      Intensity   THRR 40-80% of Max Heartrate 97-131    Ratings of Perceived Exertion 11-13      Resistance Training   Training Prescription Yes    Weight 4    Reps 10-15             Perform Capillary Blood Glucose  checks as needed.  Exercise Prescription Changes:   Exercise Prescription Changes     Row Name 12/03/22 1200 12/22/22 0854 12/24/22 0900 01/12/23 1000       Response to Exercise   Blood Pressure (Admit) 132/72 130/60 -- 170/80    Blood Pressure (Exercise) 134/62 -- -- --    Blood Pressure (Exit) 128/70 138/80 -- 130/64    Heart Rate (Admit) 58 bpm 90 bpm -- 103 bpm    Heart Rate (Exercise) 101 bpm 107 bpm -- 102 bpm    Heart Rate (Exit) 64 bpm 85 bpm -- 89 bpm    Rating of Perceived Exertion (Exercise) 13 13 -- 13    Duration Continue with 30 min of aerobic exercise without signs/symptoms of physical distress. Continue with 30 min of aerobic exercise without signs/symptoms of physical distress. -- Continue with 30 min of aerobic exercise without signs/symptoms of physical distress.    Intensity THRR unchanged THRR unchanged -- THRR unchanged      Progression   Progression Continue to progress workloads to maintain intensity without signs/symptoms of physical distress. Continue to progress workloads to maintain intensity without signs/symptoms of physical distress. -- Continue to progress workloads to maintain intensity without signs/symptoms of physical distress.      Resistance Training   Training Prescription Yes Yes -- Yes    Weight 5 5 lbs -- 5lbs    Reps 10-15 10-15 -- 10-15    Time -- 10 Minutes -- --      Treadmill   MPH 2.5 2.3 -- 2.3    Grade 0.5 0.5 -- 3    Minutes 15 15 -- 15    METs 3.09 2.92 -- 3.71      NuStep   Level 3 3 -- 3    SPM 75 70 -- 77    Minutes 15 15 -- 15    METs 1.9 1.8 -- 2      Home Exercise Plan   Plans to continue exercise at -- -- Home (comment)  walking, staff videos Home (comment)    Frequency -- -- Add 2 additional days to program exercise sessions. Add 2 additional days to program exercise sessions.    Initial Home Exercises Provided -- -- 12/24/22 --      Oxygen   Maintain Oxygen Saturation 88% or higher 88% or higher -- 88% or  higher             Exercise Comments:   Exercise Goals and Review:   Exercise Goals     Row Name 11/12/22 1606             Exercise Goals   Increase Physical Activity Yes       Intervention Provide advice, education, support and counseling about physical activity/exercise needs.;Develop an individualized exercise prescription for aerobic and resistive training based on initial evaluation findings, risk stratification, comorbidities and participant's personal goals.       Expected Outcomes Short  Term: Attend rehab on a regular basis to increase amount of physical activity.;Long Term: Exercising regularly at least 3-5 days a week.;Long Term: Add in home exercise to make exercise part of routine and to increase amount of physical activity.       Increase Strength and Stamina Yes       Intervention Provide advice, education, support and counseling about physical activity/exercise needs.;Develop an individualized exercise prescription for aerobic and resistive training based on initial evaluation findings, risk stratification, comorbidities and participant's personal goals.       Expected Outcomes Short Term: Increase workloads from initial exercise prescription for resistance, speed, and METs.;Short Term: Perform resistance training exercises routinely during rehab and add in resistance training at home;Long Term: Improve cardiorespiratory fitness, muscular endurance and strength as measured by increased METs and functional capacity ( )       Able to understand and use rate of perceived exertion (RPE) scale Yes       Intervention Provide education and explanation on how to use RPE scale       Expected Outcomes Short Term: Able to use RPE daily in rehab to express subjective intensity level;Long Term:  Able to use RPE to guide intensity level when exercising independently       Knowledge and understanding of Target Heart Rate Range (THRR) Yes       Intervention Provide education and  explanation of THRR including how the numbers were predicted and where they are located for reference       Expected Outcomes Long Term: Able to use THRR to govern intensity when exercising independently;Short Term: Able to state/look up THRR;Short Term: Able to use daily as guideline for intensity in rehab       Able to check pulse independently Yes       Intervention Provide education and demonstration on how to check pulse in carotid and radial arteries.;Review the importance of being able to check your own pulse for safety during independent exercise       Expected Outcomes Short Term: Able to explain why pulse checking is important during independent exercise;Long Term: Able to check pulse independently and accurately       Understanding of Exercise Prescription Yes       Intervention Provide education, explanation, and written materials on patient's individual exercise prescription       Expected Outcomes Short Term: Able to explain program exercise prescription;Long Term: Able to explain home exercise prescription to exercise independently                Exercise Goals Re-Evaluation :  Exercise Goals Re-Evaluation     Row Name 11/26/22 1008 12/23/22 0855 12/24/22 0955 01/14/23 1016       Exercise Goal Re-Evaluation   Exercise Goals Review Increase Physical Activity;Increase Strength and Stamina;Able to understand and use rate of perceived exertion (RPE) scale;Knowledge and understanding of Target Heart Rate Range (THRR);Understanding of Exercise Prescription Increase Physical Activity;Increase Strength and Stamina;Understanding of Exercise Prescription Increase Physical Activity;Able to understand and use rate of perceived exertion (RPE) scale;Increase Strength and Stamina;Able to understand and use Dyspnea scale;Knowledge and understanding of Target Heart Rate Range (THRR);Able to check pulse independently;Understanding of Exercise Prescription Increase Physical Activity;Increase  Strength and Stamina;Understanding of Exercise Prescription    Comments Patient says he feels he is doing very well in the program. He reports he feels he is getting stronger and his stamina is improving. 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 He  says he is exercising at home by walking on his farm. He walks his dogs on trails and does a lot of walking taking care of his farm and animals. He is progressing on the equipment. Jon Jackson Jackson been out of rehab and is getting back into his levels. He decreased his workloads on both sets of equipment. He is exercising at 2.3 speed on the treadmill and decreased his SPM on the NuStep. He is at an RPE of 13 on both sets. Will continue to monitor and progress as able. Jon Jackson been doing well in rehab again.  He is walking some on his off days and starting to notice improvements in his stamina. Reviewed home exercise with pt today.  Pt plans to walk on his land at home for exercise.  Reviewed THR, pulse, RPE, sign and symptoms, pulse oximetery and when to call 911 or MD.  Also discussed weather considerations and indoor options.  Pt voiced understanding. Jon Jackson is doing well in rehab.  He is walking on his off days and can really tell a difference in his stamina.  He also stays active working on the house and caring for the animals.  He is pleased with the progress he Jackson made.    Expected Outcomes Short Term: Patient will continu to work hard during sessions and continue to walk at home. Long Term: Patient will continue to exercise at home and continue to be active. Short term: Contiue to exercise at workloads and get back to workloads before taking time off   long term: continue to attend rehab Short: Add in more walking at home consistentnly Long; Conitnue to exercise independently Short: COnitnue to walk on off days Long: continue to improve stamina              Discharge Exercise Prescription (Final Exercise Prescription Changes):   Exercise Prescription Changes - 01/12/23 1000       Response to Exercise   Blood Pressure (Admit) 170/80    Blood Pressure (Exit) 130/64    Heart Rate (Admit) 103 bpm    Heart Rate (Exercise) 102 bpm    Heart Rate (Exit) 89 bpm    Rating of Perceived Exertion (Exercise) 13    Duration Continue with 30 min of aerobic exercise without signs/symptoms of physical distress.    Intensity THRR unchanged      Progression   Progression Continue to progress workloads to maintain intensity without signs/symptoms of physical distress.      Resistance Training   Training Prescription Yes    Weight 5lbs    Reps 10-15      Treadmill   MPH 2.3    Grade 3    Minutes 15    METs 3.71      NuStep   Level 3    SPM 77    Minutes 15    METs 2      Home Exercise Plan   Plans to continue exercise at Home (comment)    Frequency Add 2 additional days to program exercise sessions.      Oxygen   Maintain Oxygen Saturation 88% or higher             Nutrition:  Target Goals: Understanding of nutrition guidelines, daily intake of sodium 1500mg , cholesterol 200mg , calories 30% from fat and 7% or less from saturated fats, daily to have 5 or more servings of fruits and vegetables.  Biometrics:  Pre Biometrics - 11/12/22 1607       Pre  Biometrics   Height 5\' 10"  (1.778 m)    Weight 210 lb 1.6 oz (95.3 kg)    Waist Circumference 42 inches    Hip Circumference 41 inches    Waist to Hip Ratio 1.02 %    BMI (Calculated) 30.15    Grip Strength 22.9 kg    Flexibility 0 in    Single Leg Stand 21.64 seconds             Post Biometrics - 01/26/23 0943        Post  Biometrics   Height 5\' 10"  (1.778 m)    Weight 218 lb (98.9 kg)    Waist Circumference 40 inches    Hip Circumference 42 inches    Waist to Hip Ratio 0.95 %    BMI (Calculated) 31.28    Grip Strength 27 kg    Single Leg Stand 30 seconds             Nutrition Therapy Plan and Nutrition Goals:   Nutrition  Assessments:  MEDIFICTS Score Key: >=70 Need to make dietary changes  40-70 Heart Healthy Diet <= 40 Therapeutic Level Cholesterol Diet  Flowsheet Row CARDIAC REHAB PHASE II EXERCISE from 01/30/2023 in [facility] CARDIAC REHABILITATION  Picture Your Plate Total Score on Admission 55  Picture Your Plate Total Score on Discharge 73      Picture Your Plate Scores: <40 Unhealthy dietary pattern with much room for improvement. 41-50 Dietary pattern unlikely to meet recommendations for good health and room for improvement. 51-60 More healthful dietary pattern, with some room for improvement.  >60 Healthy dietary pattern, although there may be some specific behaviors that could be improved.    Nutrition Goals Re-Evaluation:  Nutrition Goals Re-Evaluation     Row Name 11/26/22 1004 12/24/22 0959 01/14/23 1118         Goals   Current Weight 207 lb (93.9 kg) -- --     Nutrition Goal Patient wants to continue to work on eating less fatty food and work on portion control. Short Term: continue to work on eating less fatty food and portion control. Long Term: Keep his weight under 210 lbs. Short: Continue to work on portion control Long: Continue to focus on heart healthy eating     Comment Patient says he knows he needs to work on his diet. He is trying to eat more fish like tuna for lunch and cut back on pork and beef. He wants to remain below 210 lbs. Faust is doing well in rehab.  He usually does pretty good with his diet.  They are doing more steamed vegetables and avoiding fried foods overall.  He did admit to cheating some last week with pizza.  They avoid fried foods.  He is trying to stay away from salt.  He continues to work on portion control. Jon Jackson is doing well in rehab.  He tries to eat good for most part.  He notes that he eats better during week than on the weekends.  This weekend he had pizza and beer and noticed his weight up on Monday.  He is trying to get better about eating  fruits and vegetables.  He will usually have a tuna salad cracker for lunch and breakfast bar in the morning.  He is trying to limit his salt still.     Expected Outcome Short Term: continue to work on eating less fatty food and portion control. Long Term: Keep his weight under 210 lbs. Short: Continue  to work on portion control Long: Continue to focus on heart healthy eating Short: Conitnue to watch binging on weekends and use moderation Long: Continue heart healthy eating              Nutrition Goals Discharge (Final Nutrition Goals Re-Evaluation):  Nutrition Goals Re-Evaluation - 01/14/23 1118       Goals   Nutrition Goal Short: Continue to work on portion control Long: Continue to focus on heart healthy eating    Comment Jon Jackson is doing well in rehab.  He tries to eat good for most part.  He notes that he eats better during week than on the weekends.  This weekend he had pizza and beer and noticed his weight up on Monday.  He is trying to get better about eating fruits and vegetables.  He will usually have a tuna salad cracker for lunch and breakfast bar in the morning.  He is trying to limit his salt still.    Expected Outcome Short: Conitnue to watch binging on weekends and use moderation Long: Continue heart healthy eating             Psychosocial: Target Goals: Acknowledge presence or absence of significant depression and/or stress, maximize coping skills, provide positive support system. Participant is able to verbalize types and ability to use techniques and skills needed for reducing stress and depression.  Initial Review & Psychosocial Screening:  Initial Psych Review & Screening - 11/12/22 1506       Initial Review   Current issues with History of Depression      Family Dynamics   Good Support System? Yes    Comments Patient's wife and 4 chidlren are his support.      Barriers   Psychosocial barriers to participate in program There are no identifiable barriers or  psychosocial needs.      Screening Interventions   Interventions Encouraged to exercise;Provide feedback about the scores to participant    Expected Outcomes Short Term goal: Utilizing psychosocial counselor, staff and physician to assist with identification of specific Stressors or current issues interfering with healing process. Setting desired goal for each stressor or current issue identified.;Long Term Goal: Stressors or current issues are controlled or eliminated.;Short Term goal: Identification and review with participant of any Quality of Life or Depression concerns found by scoring the questionnaire.;Long Term goal: The participant improves quality of Life and PHQ9 Scores as seen by post scores and/or verbalization of changes             Quality of Life Scores:  Quality of Life - 01/30/23 0926       Quality of Life   Select Quality of Life      Quality of Life Scores   Health/Function Pre 19.39 %    Health/Function Post 25.73 %    Health/Function % Change 32.7 %    Socioeconomic Pre 21.64 %    Socioeconomic Post 22.08 %    Socioeconomic % Change  2.03 %    Psych/Spiritual Pre 20.86 %    Psych/Spiritual Post 21.79 %    Psych/Spiritual % Change 4.46 %    Family Pre 27.6 %    Family Post 28.13 %    Family % Change 1.92 %    GLOBAL Pre 21.42 %    GLOBAL Post 24.48 %    GLOBAL % Change 14.29 %            Scores of 19 and below usually indicate a poorer quality  of life in these areas.  A difference of  2-3 points is a clinically meaningful difference.  A difference of 2-3 points in the total score of the Quality of Life Index Jackson been associated with significant improvement in overall quality of life, self-image, physical symptoms, and general health in studies assessing change in quality of life.  PHQ-9: Review Flowsheet  More data exists      01/30/2023 11/18/2022 11/12/2022 10/17/2022 07/10/2022  Depression screen PHQ 2/9  Decreased Interest 1 1 0 1 1  Down,  Depressed, Hopeless 0 0 0 0 0  PHQ - 2 Score 1 1 0 1 1  Altered sleeping 0 0 0 0 0  Tired, decreased energy 1 1 1 1 1   Change in appetite 1 0 0 0 0  Feeling bad or failure about yourself  0 0 0 0 0  Trouble concentrating 1 0 1 1 0  Moving slowly or fidgety/restless 0 0 0 0 0  Suicidal thoughts 0 0 0 0 0  PHQ-9 Score 4 2 2 3 2   Difficult doing work/chores Somewhat difficult - Not difficult at all Not difficult at all Somewhat difficult    Details           Interpretation of Total Score  Total Score Depression Severity:  1-4 = Minimal depression, 5-9 = Mild depression, 10-14 = Moderate depression, 15-19 = Moderately severe depression, 20-27 = Severe depression   Psychosocial Evaluation and Intervention:  Psychosocial Evaluation - 11/12/22 1507       Psychosocial Evaluation & Interventions   Interventions Relaxation education;Stress management education;Encouraged to exercise with the program and follow exercise prescription    Comments Patient referred to cardiac rehab with S/P Aortic Valve Replacement; CABGx1 and NSTEMI. His PHQ-9 score was 2 due to lack of energy and trouble concentrating at times. He does have a history of depression and is currently on Celexa. He says he Jackson been taking this medication for 10+ years and feels it manages his depression well. He denies any anxiety or significant stressors in his life. He quit smoking during his hospitalization 10/02/22. He was using nicotine patches but says he quit using. He says he is able to deal with his urges to smoke. He says these episodes are worse if he is around someone smoking so he tries to avoid this. No one in his family smokes. He and his son-in-law quit at the same time. He reports sleeping well. He is retired from [employer]. 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 He is currently not exercising but says he does do a lot of walking on his farm. His goals for the program are to improve his stamina and to feel better overall and be able to get back to his normal activities. He Jackson no barriers identified to participate in the program.    Expected Outcomes Short Term: attend the program consistently; Long Term: Improve his stamina and feel better.    Continue Psychosocial Services  Follow up required by staff             Psychosocial Re-Evaluation:  Psychosocial Re-Evaluation     Row Name 11/26/22 1103 12/24/22 0956 01/14/23 1017         Psychosocial Re-Evaluation  Current issues with Current Depression;Current Psychotropic Meds Current Depression;Current Psychotropic Meds;Current Stress Concerns Current Psychotropic Meds;Current Stress Concerns     Comments Patient is doing very well in the program. He says he feels like he is making a lot of progress and is meeting his goals. His strength and stamina are improving and he is able to do all the activities he wants to do without difficulty. He continues to remain tobacco free. He says he still Jackson urges at times but these are decreasing. He is sleeping well and reports no stress concerns. He continue to take celexa to manage his depression and feels it is managed well. We will continue to monitor his progress. Jon Jackson is doing well in rehab. He missed some from being sick and then last week his mother passed.  He is happy for her as she is no longer suffering, but now dealing with his grief and affairs of her state.  Otherwise, he is feeling better and doing well.  He denies any depression symptoms.  He still struggles with sleep as he wakes around 1-2am and difficultly to get back to sleep.  He is using melatonin to help. Jon Jackson is doing well in rehab.  He is feeling better overall.  He is sleeping well despite multiple trips to bathroom at night.  He is  usually abl to get back to sleep. He is feeling well balanced currently.  He enjoys rehab and walking at home.     Expected Outcomes Short Term Goal: Continue to attend sessions and get stronger and abstain from smoking. Long Term: Continue to abstain from smoking and continue to keep his depression mananged. Short: Conitnue to cope with loss of mother Long: Conitnue to exercise for mental boost Short: Continue to walk for mental boost LOng: conitnue to sleep well.     Interventions Encouraged to attend Cardiac Rehabilitation for the exercise;Relaxation education;Stress management education Stress management education;Encouraged to attend Cardiac Rehabilitation for the exercise Stress management education;Encouraged to attend Cardiac Rehabilitation for the exercise     Continue Psychosocial Services  Follow up required by staff Follow up required by staff --              Psychosocial Discharge (Final Psychosocial Re-Evaluation):  Psychosocial Re-Evaluation - 01/14/23 1017       Psychosocial Re-Evaluation   Current issues with Current Psychotropic Meds;Current Stress Concerns    Comments Jon Jackson is doing well in rehab.  He is feeling better overall.  He is sleeping well despite multiple trips to bathroom at night.  He is usually abl to get back to sleep. He is feeling well balanced currently.  He enjoys rehab and walking at home.    Expected Outcomes Short: Continue to walk for mental boost LOng: conitnue to sleep well.    Interventions Stress management education;Encouraged to attend Cardiac Rehabilitation for the exercise             Vocational Rehabilitation: Provide vocational rehab assistance to qualifying candidates.   Vocational Rehab Evaluation & Intervention:  Vocational Rehab - 11/12/22 1506       Initial Vocational Rehab Evaluation & Intervention   Assessment shows need for Vocational Rehabilitation No      Vocational Rehab Re-Evaulation   Comments Patient is retired.              Education: Education Goals: Education classes will be provided on a weekly basis, covering required topics. Participant will state understanding/return demonstration of topics presented.  Learning Barriers/Preferences:  Learning  Barriers/Preferences - 11/12/22 1505       Learning Barriers/Preferences   Learning Barriers None    Learning Preferences Audio             Education Topics: Hypertension, Hypertension Reduction -Define heart disease and high blood pressure. Discus how high blood pressure affects the body and ways to reduce high blood pressure.   Exercise and Your Heart -Discuss why it is important to exercise, the FITT principles of exercise, normal and abnormal responses to exercise, and how to exercise safely. Flowsheet Row CARDIAC REHAB PHASE II EXERCISE from 01/23/2023 in [place] CARDIAC REHABILITATION  Date 12/24/22  Educator [facility]  Instruction Review Code 1- Verbalizes Understanding       Angina -Discuss definition of angina, causes of angina, treatment of angina, and how to decrease risk of having angina.   Cardiac Medications -Review what the following cardiac medications are used for, how they affect the body, and side effects that may occur when taking the medications.  Medications include Aspirin, Beta blockers, calcium channel blockers, ACE Inhibitors, angiotensin receptor blockers, diuretics, digoxin, and antihyperlipidemics. Flowsheet Row CARDIAC REHAB PHASE II EXERCISE from 11/26/2022 in [place] CARDIAC REHABILITATION  Date 11/26/22  Educator DJ  Instruction Review Code 1- Verbalizes Understanding       Congestive Heart Failure -Discuss the definition of CHF, how to live with CHF, the signs and symptoms of CHF, and how keep track of weight and sodium intake.   Heart Disease and Intimacy -Discus the effect sexual activity Jackson on the heart, how changes occur during intimacy as we age, and safety during sexual  activity. Flowsheet Row CARDIAC REHAB PHASE II EXERCISE from 01/23/2023 in [place] CARDIAC REHABILITATION  Date 12/31/22  Educator HB  Instruction Review Code 1- Verbalizes Understanding       Smoking Cessation / COPD -Discuss different methods to quit smoking, the health benefits of quitting smoking, and the definition of COPD. Flowsheet Row CARDIAC REHAB PHASE II EXERCISE from 01/23/2023 in [place] CARDIAC REHABILITATION  Date 12/03/22  Educator [facility]  Instruction Review Code 2- Demonstrated Understanding       Nutrition I: Fats -Discuss the types of cholesterol, what cholesterol does to the heart, and how cholesterol levels can be controlled. Flowsheet Row CARDIAC REHAB PHASE II EXERCISE from 01/23/2023 in [place] CARDIAC REHABILITATION  Date 01/14/23  Educator [facility]  Instruction Review Code 1- Verbalizes Understanding       Nutrition II: Labels -Discuss the different components of food labels and how to read food label Flowsheet Row CARDIAC REHAB PHASE II EXERCISE from 01/23/2023 in [place] CARDIAC REHABILITATION  Date 01/14/23  Educator [facility]  Instruction Review Code 1- Verbalizes Understanding       Heart Parts/Heart Disease and PAD -Discuss the anatomy of the heart, the pathway of blood circulation through the heart, and these are affected by heart disease.   Stress I: Signs and Symptoms -Discuss the causes of stress, how stress may lead to anxiety and depression, and ways to limit stress. Flowsheet Row CARDIAC REHAB PHASE II EXERCISE from 01/23/2023 in [place] CARDIAC REHABILITATION  Date 01/21/23  Educator [facility]  Instruction Review Code 1- Verbalizes Understanding       Stress II: Relaxation -Discuss different types of relaxation techniques to limit stress. Flowsheet Row CARDIAC REHAB PHASE II EXERCISE from 01/23/2023 in [place] CARDIAC REHABILITATION  Date 01/21/23  Educator [facility]  Instruction Review Code 1- [employer]  Signs of Stroke / TIA -Discuss definition of a stroke, what the signs and symptoms are of a stroke, and how to identify when someone is having stroke.   Knowledge Questionnaire Score:  Knowledge Questionnaire Score - 01/30/23 0927       Knowledge Questionnaire Score   Pre Score 20/24    Post Score 23/24             Core Components/Risk Factors/Patient Goals at Admission:  Personal Goals and Risk Factors at Admission - 11/12/22 1506       Core Components/Risk Factors/Patient Goals on Admission    Weight Management Weight Maintenance    Hypertension Yes    Intervention Provide education on lifestyle modifcations including regular physical activity/exercise, weight management, moderate sodium restriction and increased consumption of fresh fruit, vegetables, and low fat dairy, alcohol moderation, and smoking cessation.;Monitor prescription use compliance.    Expected Outcomes Short Term: Continued assessment and intervention until BP is < 140/38mm HG in hypertensive participants. < 130/58mm HG in hypertensive participants with diabetes, heart failure or chronic kidney disease.;Long Term: Maintenance of blood pressure at goal levels.    Lipids Yes    Intervention Provide education and support for participant on nutrition & aerobic/resistive exercise along with prescribed medications to achieve LDL 70mg , HDL >40mg .    Expected Outcomes Short Term: Participant states understanding of desired cholesterol values and is compliant with medications prescribed. Participant is following exercise prescription and nutrition guidelines.;Long Term: Cholesterol controlled with medications as prescribed, with individualized exercise RX and with personalized nutrition plan. Value goals: LDL < 70mg , HDL > 40 mg.             Core Components/Risk Factors/Patient Goals Review:   Goals and Risk Factor Review     Row Name 11/26/22 0956 12/24/22 1001 01/14/23 1125         Core  Components/Risk Factors/Patient Goals Review   Personal Goals Review Weight Management/Obesity;Hypertension;Lipids Weight Management/Obesity;Hypertension;Lipids Weight Management/Obesity;Hypertension;Lipids     Review Patient is doing well in the program with consistent attendance. His goal for his weight is to remain under 210 lbs. He is currently at 207 lbs so he is pleased with this. He is trying to watch his diet and portion control. He says he feels great and is able to do all he wants to do. His blood pressure is above goal averaging 140 to 150 systolic. He says he does check his blood pressure at home. He says he usually gets 150/60. He says he is taking his medications as prescribed and he takes his blood pressure medication at night because they make him drowsy. I told him we were going to notiffy his cardiologist of his readings. He requested we reach out to Robin Searing, FNP as this is the provider he Jackson seen the most. We will continue to monitor his progress in the program. Jon Jackson is doing well in rehab.  He really wants to get below 210 lb.  However, his weight is starting to increase as he finds when he is active he wants to eat more. We talked about allowing body to adjust to burning off stores versus eating more to compensate.  His pressures are doing well and he continues to check them at home.  He is feeling well managed on his medications and good overall. Jon Jackson is doing well in rehab.  His weight is usually up in the beginning of the week from weekend eating.Overall, it is coming down.His pressures are doing well.  He is  feeling good on his meds.     Expected Outcomes Short Term: Continue to monitor b/p at home. Long term: Continue to be compliant with his medications and with weight management. Short: Conitnue to work on weight loss long; Conitnue to montior risk factors Short: Continue to keep eye on blood pressures Long: Continue to monitor risk factors              Core  Components/Risk Factors/Patient Goals at Discharge (Final Review):   Goals and Risk Factor Review - 01/14/23 1125       Core Components/Risk Factors/Patient Goals Review   Personal Goals Review Weight Management/Obesity;Hypertension;Lipids    Review Jon Jackson is doing well in rehab.  His weight is usually up in the beginning of the week from weekend eating.Overall, it is coming down.His pressures are doing well.  He is feeling good on his meds.    Expected Outcomes Short: Continue to keep eye on blood pressures Long: Continue to monitor risk factors             ITP Comments:  ITP Comments     Row Name 11/12/22 1611 12/10/22 0754 01/07/23 0649 02/04/23 0651     ITP Comments Patient attend orientation today.  Patient is attendingCardiac Rehabilitation Program.  Documentation for diagnosis can be found in [facility] 10/10/22.  Reviewed medical chart, RPE/RPD, gym safety, and program guidelines.  Patient was fitted to equipment they will be using during rehab.  Patient is scheduled to start exercise on 11/17/22 at 915.   Initial ITP created and sent for review and signature by Dr. Dina Rich, Medical Director for Cardiac Rehabilitation Program. 30 day review completed. ITP sent to Dr. Dina Rich, Medical Director of Cardiac Rehab. Continue with ITP unless changes are made by physician.   Pt called out yesterday, exposed to COVID on Monday, c/o headache, encouraged to test and keep [place] posted. 30 day review completed. ITP sent to Dr. Dina Rich, Medical Director of Cardiac Rehab. Continue with ITP unless changes are made by physician. 30 day review completed. ITP sent to Dr. Dina Rich, Medical Director of Cardiac Rehab. Continue with ITP unless changes are made by physician.             Comments: 30 day review

## 2023-02-06 ENCOUNTER — Encounter (HOSPITAL_COMMUNITY): Payer: Self-pay | Admitting: *Deleted

## 2023-02-06 DIAGNOSIS — Z951 Presence of aortocoronary bypass graft: Secondary | ICD-10-CM

## 2023-02-06 DIAGNOSIS — Z953 Presence of xenogenic heart valve: Secondary | ICD-10-CM

## 2023-02-06 DIAGNOSIS — I214 Non-ST elevation (NSTEMI) myocardial infarction: Secondary | ICD-10-CM

## 2023-02-06 NOTE — Progress Notes (Signed)
Cardiac Individual Treatment Plan  Patient Details  Name: Jon Jackson MRN: [id number] Date of Birth: 19-Dec-1950 Referring Provider:   Flowsheet Row CARDIAC REHAB PHASE II ORIENTATION from 11/12/2022 in [facility] CARDIAC REHABILITATION  Referring Provider Dr. Laneta Simmers       Initial Encounter Date:  Flowsheet Row CARDIAC REHAB PHASE II ORIENTATION from 11/12/2022 in [place] CARDIAC REHABILITATION  Date 11/12/22       Visit Diagnosis: NSTEMI (non-ST elevated myocardial infarction) (HCC)  S/P CABG x 1  S/P aortic valve replacement with bioprosthetic valve  Patient's Home Medications on Admission:  Current Outpatient Medications:    amoxicillin (AMOXIL) 500 MG tablet, Take 2000 mg (FOUR TABLETS) by mouth one hour before any dental appointments including cleanings, Disp: 12 tablet, Rfl: 1   aspirin EC 325 MG tablet, Take 1 tablet (325 mg total) by mouth daily., Disp: , Rfl:    atorvastatin (LIPITOR) 80 MG tablet, Take 1 tablet (80 mg total) by mouth daily., Disp: 90 tablet, Rfl: 3   citalopram (CELEXA) 20 MG tablet, Take 1 tablet (20 mg total) by mouth daily., Disp: 90 tablet, Rfl: 1   Fe Fum-Vit C-Vit B12-FA (TRIGELS-F FORTE) CAPS capsule, Take 1 capsule by mouth daily after breakfast., Disp: 30 capsule, Rfl: 2   losartan (COZAAR) 50 MG tablet, Take 1 tablet (50 mg total) by mouth daily., Disp: 90 tablet, Rfl: 1   Multiple Vitamin (MULTIVITAMIN) tablet, Take 1 tablet by mouth daily., Disp: , Rfl:    omeprazole (PRILOSEC) 40 MG capsule, Take 1 capsule (40 mg total) by mouth daily., Disp: 90 capsule, Rfl: 1  Past Medical History: Past Medical History:  Diagnosis Date   Anemia    as a child   Anxiety    Arthritis    Atrial fibrillation (HCC)    Basal cell carcinoma    head   Coronary artery disease    Depression    Dysrhythmia    GERD (gastroesophageal reflux disease)    Heart murmur    Hyperlipidemia    Myocardial infarction (HCC) 10/02/2022   Pneumonia 02/2022    Squamous carcinoma     Tobacco Use: Social History   Tobacco Use  Smoking Status Some Days   Current packs/day: 0.25   Average packs/day: 0.3 packs/day for 47.0 years (11.8 ttl pk-yrs)   Types: Cigars, Cigarettes  Smokeless Tobacco Never  Tobacco Comments   Cigars- chews on tips    Labs: Review Flowsheet  More data exists      Latest Ref Rng & Units 10/03/2022 10/09/2022 10/10/2022 11/18/2022 02/02/2023  Labs for ITP Cardiac and Pulmonary Rehab  Cholestrol 100 - 199 mg/dL 253  - - 664  403   LDL (calc) 0 - 99 mg/dL 474  - - 80  71   HDL-C >39 mg/dL 47  - - 45  49   Trlycerides 0 - 149 mg/dL 75  - - 259  77   Hemoglobin A1c 4.8 - 5.6 % - 5.2  - - -  PH, Arterial 7.35 - 7.45 7.393  7.41  7.424  7.313  7.253  7.327  7.390  7.355  7.303  - -  PCO2 arterial 32 - 48 mmHg 36.6  38  29.8  46.0  52.1  45.9  40.1  42.5  51.8  - -  Bicarbonate 20.0 - 28.0 mmol/L 22.3  24.9  25.0  24.1  19.7  23.3  23.0  24.0  24.2  31.1  23.8  25.7  - -  TCO2 22 - 32 mmol/L 23  26  26   - 21  25  25  25  26  26  25  27   33  25  28  27  28   - -  Acid-base deficit 0.0 - 2.0 mmol/L 2.0  1.0  1.0  0.3  4.0  3.0  5.0  2.0  1.0  2.0  1.0  - -  O2 Saturation % 95  73  76  99.4  99  99  96  100  100  81  100  100  - -    Details       Multiple values from one day are sorted in reverse-chronological order         Capillary Blood Glucose: Lab Results  Component Value Date   GLUCAP 106 (H) 10/14/2022   GLUCAP 112 (H) 10/13/2022   GLUCAP 106 (H) 10/13/2022   GLUCAP 85 10/13/2022   GLUCAP 90 10/13/2022     Exercise Target Goals: Exercise Program Goal: Individual exercise prescription set using results from initial 6 min walk test and THRR while considering  patient's activity barriers and safety.   Exercise Prescription Goal: Starting with aerobic activity 30 plus minutes a day, 3 days per week for initial exercise prescription. Provide home exercise prescription and guidelines that participant  acknowledges understanding prior to discharge.  Activity Barriers & Risk Stratification:  Activity Barriers & Cardiac Risk Stratification - 11/12/22 1413       Activity Barriers & Cardiac Risk Stratification   Activity Barriers Other (comment)    Comments Patient has limited mobility in his left arm due to old shoulder injury repaired with surgery.    Cardiac Risk Stratification High             6 Minute Walk:  6 Minute Walk     Row Name 11/12/22 1603 01/26/23 0942       6 Minute Walk   Phase Initial Discharge    Distance 1600 feet 1510 feet  initial recalculated to 1440 ft    Distance % Change -- 4.9 %    Distance Feet Change -- 70 ft    Walk Time 6 minutes 6 minutes    # of Rest Breaks 0 0    MPH 3.03 2.86    METS 3.11 3.13    RPE 11 13    VO2 Peak 10.9 10.97    Symptoms No No    Resting HR 63 bpm 75 bpm    Resting BP 160/70 120/64    Resting Oxygen Saturation  96 % --    Exercise Oxygen Saturation  during 6 min walk 96 % --    Max Ex. HR 77 bpm 94 bpm    Max Ex. BP 156/68 162/62    2 Minute Post BP 146/64 --             Oxygen Initial Assessment:   Oxygen Re-Evaluation:   Oxygen Discharge (Final Oxygen Re-Evaluation):   Initial Exercise Prescription:  Initial Exercise Prescription - 11/12/22 1600       Date of Initial Exercise RX and Referring Provider   Date 11/12/22    Referring Provider Dr. Laneta Simmers      Oxygen   Maintain Oxygen Saturation 88% or higher      Treadmill   MPH 2    Grade 0.5    Minutes 15      REL-XR   Level 2    Speed 50  Minutes 15      Prescription Details   Frequency (times per week) 3    Duration Progress to 30 minutes of continuous aerobic without signs/symptoms of physical distress      Intensity   THRR 40-80% of Max Heartrate 97-131    Ratings of Perceived Exertion 11-13      Resistance Training   Training Prescription Yes    Weight 4    Reps 10-15             Perform Capillary Blood Glucose  checks as needed.  Exercise Prescription Changes:   Exercise Prescription Changes     Row Name 12/03/22 1200 12/22/22 0854 12/24/22 0900 01/12/23 1000       Response to Exercise   Blood Pressure (Admit) 132/72 130/60 -- 170/80    Blood Pressure (Exercise) 134/62 -- -- --    Blood Pressure (Exit) 128/70 138/80 -- 130/64    Heart Rate (Admit) 58 bpm 90 bpm -- 103 bpm    Heart Rate (Exercise) 101 bpm 107 bpm -- 102 bpm    Heart Rate (Exit) 64 bpm 85 bpm -- 89 bpm    Rating of Perceived Exertion (Exercise) 13 13 -- 13    Duration Continue with 30 min of aerobic exercise without signs/symptoms of physical distress. Continue with 30 min of aerobic exercise without signs/symptoms of physical distress. -- Continue with 30 min of aerobic exercise without signs/symptoms of physical distress.    Intensity THRR unchanged THRR unchanged -- THRR unchanged      Progression   Progression Continue to progress workloads to maintain intensity without signs/symptoms of physical distress. Continue to progress workloads to maintain intensity without signs/symptoms of physical distress. -- Continue to progress workloads to maintain intensity without signs/symptoms of physical distress.      Resistance Training   Training Prescription Yes Yes -- Yes    Weight 5 5 lbs -- 5lbs    Reps 10-15 10-15 -- 10-15    Time -- 10 Minutes -- --      Treadmill   MPH 2.5 2.3 -- 2.3    Grade 0.5 0.5 -- 3    Minutes 15 15 -- 15    METs 3.09 2.92 -- 3.71      NuStep   Level 3 3 -- 3    SPM 75 70 -- 77    Minutes 15 15 -- 15    METs 1.9 1.8 -- 2      Home Exercise Plan   Plans to continue exercise at -- -- Home (comment)  walking, staff videos Home (comment)    Frequency -- -- Add 2 additional days to program exercise sessions. Add 2 additional days to program exercise sessions.    Initial Home Exercises Provided -- -- 12/24/22 --      Oxygen   Maintain Oxygen Saturation 88% or higher 88% or higher -- 88% or  higher             Exercise Comments:   Exercise Goals and Review:   Exercise Goals     Row Name 11/12/22 1606             Exercise Goals   Increase Physical Activity Yes       Intervention Provide advice, education, support and counseling about physical activity/exercise needs.;Develop an individualized exercise prescription for aerobic and resistive training based on initial evaluation findings, risk stratification, comorbidities and participant's personal goals.       Expected Outcomes Short  Term: Attend rehab on a regular basis to increase amount of physical activity.;Long Term: Exercising regularly at least 3-5 days a week.;Long Term: Add in home exercise to make exercise part of routine and to increase amount of physical activity.       Increase Strength and Stamina Yes       Intervention Provide advice, education, support and counseling about physical activity/exercise needs.;Develop an individualized exercise prescription for aerobic and resistive training based on initial evaluation findings, risk stratification, comorbidities and participant's personal goals.       Expected Outcomes Short Term: Increase workloads from initial exercise prescription for resistance, speed, and METs.;Short Term: Perform resistance training exercises routinely during rehab and add in resistance training at home;Long Term: Improve cardiorespiratory fitness, muscular endurance and strength as measured by increased METs and functional capacity ( )       Able to understand and use rate of perceived exertion (RPE) scale Yes       Intervention Provide education and explanation on how to use RPE scale       Expected Outcomes Short Term: Able to use RPE daily in rehab to express subjective intensity level;Long Term:  Able to use RPE to guide intensity level when exercising independently       Knowledge and understanding of Target Heart Rate Range (THRR) Yes       Intervention Provide education and  explanation of THRR including how the numbers were predicted and where they are located for reference       Expected Outcomes Long Term: Able to use THRR to govern intensity when exercising independently;Short Term: Able to state/look up THRR;Short Term: Able to use daily as guideline for intensity in rehab       Able to check pulse independently Yes       Intervention Provide education and demonstration on how to check pulse in carotid and radial arteries.;Review the importance of being able to check your own pulse for safety during independent exercise       Expected Outcomes Short Term: Able to explain why pulse checking is important during independent exercise;Long Term: Able to check pulse independently and accurately       Understanding of Exercise Prescription Yes       Intervention Provide education, explanation, and written materials on patient's individual exercise prescription       Expected Outcomes Short Term: Able to explain program exercise prescription;Long Term: Able to explain home exercise prescription to exercise independently                Exercise Goals Re-Evaluation :  Exercise Goals Re-Evaluation     Row Name 11/26/22 1008 12/23/22 0855 12/24/22 0955 01/14/23 1016       Exercise Goal Re-Evaluation   Exercise Goals Review Increase Physical Activity;Increase Strength and Stamina;Able to understand and use rate of perceived exertion (RPE) scale;Knowledge and understanding of Target Heart Rate Range (THRR);Understanding of Exercise Prescription Increase Physical Activity;Increase Strength and Stamina;Understanding of Exercise Prescription Increase Physical Activity;Able to understand and use rate of perceived exertion (RPE) scale;Increase Strength and Stamina;Able to understand and use Dyspnea scale;Knowledge and understanding of Target Heart Rate Range (THRR);Able to check pulse independently;Understanding of Exercise Prescription Increase Physical Activity;Increase  Strength and Stamina;Understanding of Exercise Prescription    Comments Patient says he feels he is doing very well in the program. He reports he feels he is getting stronger and his stamina is improving. 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 He  says he is exercising at home by walking on his farm. He walks his dogs on trails and does a lot of walking taking care of his farm and animals. He is progressing on the equipment. Hamsa has been out of rehab and is getting back into his levels. He decreased his workloads on both sets of equipment. He is exercising at 2.3 speed on the treadmill and decreased his SPM on the NuStep. He is at an RPE of 13 on both sets. Will continue to monitor and progress as able. Keifer has been doing well in rehab again.  He is walking some on his off days and starting to notice improvements in his stamina. Reviewed home exercise with pt today.  Pt plans to walk on his land at home for exercise.  Reviewed THR, pulse, RPE, sign and symptoms, pulse oximetery and when to call 911 or MD.  Also discussed weather considerations and indoor options.  Pt voiced understanding. Addie is doing well in rehab.  He is walking on his off days and can really tell a difference in his stamina.  He also stays active working on the house and caring for the animals.  He is pleased with the progress he has made.    Expected Outcomes Short Term: Patient will continu to work hard during sessions and continue to walk at home. Long Term: Patient will continue to exercise at home and continue to be active. Short term: Contiue to exercise at workloads and get back to workloads before taking time off   long term: continue to attend rehab Short: Add in more walking at home consistentnly Long; Conitnue to exercise independently Short: COnitnue to walk on off days Long: continue to improve stamina              Discharge Exercise Prescription (Final Exercise Prescription Changes):   Exercise Prescription Changes - 01/12/23 1000       Response to Exercise   Blood Pressure (Admit) 170/80    Blood Pressure (Exit) 130/64    Heart Rate (Admit) 103 bpm    Heart Rate (Exercise) 102 bpm    Heart Rate (Exit) 89 bpm    Rating of Perceived Exertion (Exercise) 13    Duration Continue with 30 min of aerobic exercise without signs/symptoms of physical distress.    Intensity THRR unchanged      Progression   Progression Continue to progress workloads to maintain intensity without signs/symptoms of physical distress.      Resistance Training   Training Prescription Yes    Weight 5lbs    Reps 10-15      Treadmill   MPH 2.3    Grade 3    Minutes 15    METs 3.71      NuStep   Level 3    SPM 77    Minutes 15    METs 2      Home Exercise Plan   Plans to continue exercise at Home (comment)    Frequency Add 2 additional days to program exercise sessions.      Oxygen   Maintain Oxygen Saturation 88% or higher             Nutrition:  Target Goals: Understanding of nutrition guidelines, daily intake of sodium 1500mg , cholesterol 200mg , calories 30% from fat and 7% or less from saturated fats, daily to have 5 or more servings of fruits and vegetables.  Biometrics:  Pre Biometrics - 11/12/22 1607       Pre  Biometrics   Height 5\' 10"  (1.778 m)    Weight 210 lb 1.6 oz (95.3 kg)    Waist Circumference 42 inches    Hip Circumference 41 inches    Waist to Hip Ratio 1.02 %    BMI (Calculated) 30.15    Grip Strength 22.9 kg    Flexibility 0 in    Single Leg Stand 21.64 seconds             Post Biometrics - 01/26/23 0943        Post  Biometrics   Height 5\' 10"  (1.778 m)    Weight 218 lb (98.9 kg)    Waist Circumference 40 inches    Hip Circumference 42 inches    Waist to Hip Ratio 0.95 %    BMI (Calculated) 31.28    Grip Strength 27 kg    Single Leg Stand 30 seconds             Nutrition Therapy Plan and Nutrition Goals:   Nutrition  Assessments:  MEDIFICTS Score Key: >=70 Need to make dietary changes  40-70 Heart Healthy Diet <= 40 Therapeutic Level Cholesterol Diet  Flowsheet Row CARDIAC REHAB PHASE II EXERCISE from 01/30/2023 in [facility] CARDIAC REHABILITATION  Picture Your Plate Total Score on Admission 55  Picture Your Plate Total Score on Discharge 73      Picture Your Plate Scores: <62 Unhealthy dietary pattern with much room for improvement. 41-50 Dietary pattern unlikely to meet recommendations for good health and room for improvement. 51-60 More healthful dietary pattern, with some room for improvement.  >60 Healthy dietary pattern, although there may be some specific behaviors that could be improved.    Nutrition Goals Re-Evaluation:  Nutrition Goals Re-Evaluation     Row Name 11/26/22 1004 12/24/22 0959 01/14/23 1118         Goals   Current Weight 207 lb (93.9 kg) -- --     Nutrition Goal Patient wants to continue to work on eating less fatty food and work on portion control. Short Term: continue to work on eating less fatty food and portion control. Long Term: Keep his weight under 210 lbs. Short: Continue to work on portion control Long: Continue to focus on heart healthy eating     Comment Patient says he knows he needs to work on his diet. He is trying to eat more fish like tuna for lunch and cut back on pork and beef. He wants to remain below 210 lbs. Jatavion is doing well in rehab.  He usually does pretty good with his diet.  They are doing more steamed vegetables and avoiding fried foods overall.  He did admit to cheating some last week with pizza.  They avoid fried foods.  He is trying to stay away from salt.  He continues to work on portion control. Olalekan is doing well in rehab.  He tries to eat good for most part.  He notes that he eats better during week than on the weekends.  This weekend he had pizza and beer and noticed his weight up on Monday.  He is trying to get better about eating  fruits and vegetables.  He will usually have a tuna salad cracker for lunch and breakfast bar in the morning.  He is trying to limit his salt still.     Expected Outcome Short Term: continue to work on eating less fatty food and portion control. Long Term: Keep his weight under 210 lbs. Short: Continue  to work on portion control Long: Continue to focus on heart healthy eating Short: Conitnue to watch binging on weekends and use moderation Long: Continue heart healthy eating              Nutrition Goals Discharge (Final Nutrition Goals Re-Evaluation):  Nutrition Goals Re-Evaluation - 01/14/23 1118       Goals   Nutrition Goal Short: Continue to work on portion control Long: Continue to focus on heart healthy eating    Comment Stellar is doing well in rehab.  He tries to eat good for most part.  He notes that he eats better during week than on the weekends.  This weekend he had pizza and beer and noticed his weight up on Monday.  He is trying to get better about eating fruits and vegetables.  He will usually have a tuna salad cracker for lunch and breakfast bar in the morning.  He is trying to limit his salt still.    Expected Outcome Short: Conitnue to watch binging on weekends and use moderation Long: Continue heart healthy eating             Psychosocial: Target Goals: Acknowledge presence or absence of significant depression and/or stress, maximize coping skills, provide positive support system. Participant is able to verbalize types and ability to use techniques and skills needed for reducing stress and depression.  Initial Review & Psychosocial Screening:  Initial Psych Review & Screening - 11/12/22 1506       Initial Review   Current issues with History of Depression      Family Dynamics   Good Support System? Yes    Comments Patient's wife and 4 chidlren are his support.      Barriers   Psychosocial barriers to participate in program There are no identifiable barriers or  psychosocial needs.      Screening Interventions   Interventions Encouraged to exercise;Provide feedback about the scores to participant    Expected Outcomes Short Term goal: Utilizing psychosocial counselor, staff and physician to assist with identification of specific Stressors or current issues interfering with healing process. Setting desired goal for each stressor or current issue identified.;Long Term Goal: Stressors or current issues are controlled or eliminated.;Short Term goal: Identification and review with participant of any Quality of Life or Depression concerns found by scoring the questionnaire.;Long Term goal: The participant improves quality of Life and PHQ9 Scores as seen by post scores and/or verbalization of changes             Quality of Life Scores:  Quality of Life - 01/30/23 0926       Quality of Life   Select Quality of Life      Quality of Life Scores   Health/Function Pre 19.39 %    Health/Function Post 25.73 %    Health/Function % Change 32.7 %    Socioeconomic Pre 21.64 %    Socioeconomic Post 22.08 %    Socioeconomic % Change  2.03 %    Psych/Spiritual Pre 20.86 %    Psych/Spiritual Post 21.79 %    Psych/Spiritual % Change 4.46 %    Family Pre 27.6 %    Family Post 28.13 %    Family % Change 1.92 %    GLOBAL Pre 21.42 %    GLOBAL Post 24.48 %    GLOBAL % Change 14.29 %            Scores of 19 and below usually indicate a poorer quality  of life in these areas.  A difference of  2-3 points is a clinically meaningful difference.  A difference of 2-3 points in the total score of the Quality of Life Index has been associated with significant improvement in overall quality of life, self-image, physical symptoms, and general health in studies assessing change in quality of life.  PHQ-9: Review Flowsheet  More data exists      01/30/2023 11/18/2022 11/12/2022 10/17/2022 07/10/2022  Depression screen PHQ 2/9  Decreased Interest 1 1 0 1 1  Down,  Depressed, Hopeless 0 0 0 0 0  PHQ - 2 Score 1 1 0 1 1  Altered sleeping 0 0 0 0 0  Tired, decreased energy 1 1 1 1 1   Change in appetite 1 0 0 0 0  Feeling bad or failure about yourself  0 0 0 0 0  Trouble concentrating 1 0 1 1 0  Moving slowly or fidgety/restless 0 0 0 0 0  Suicidal thoughts 0 0 0 0 0  PHQ-9 Score 4 2 2 3 2   Difficult doing work/chores Somewhat difficult - Not difficult at all Not difficult at all Somewhat difficult    Details           Interpretation of Total Score  Total Score Depression Severity:  1-4 = Minimal depression, 5-9 = Mild depression, 10-14 = Moderate depression, 15-19 = Moderately severe depression, 20-27 = Severe depression   Psychosocial Evaluation and Intervention:  Psychosocial Evaluation - 11/12/22 1507       Psychosocial Evaluation & Interventions   Interventions Relaxation education;Stress management education;Encouraged to exercise with the program and follow exercise prescription    Comments Patient referred to cardiac rehab with S/P Aortic Valve Replacement; CABGx1 and NSTEMI. His PHQ-9 score was 2 due to lack of energy and trouble concentrating at times. He does have a history of depression and is currently on Celexa. He says he has been taking this medication for 10+ years and feels it manages his depression well. He denies any anxiety or significant stressors in his life. He quit smoking during his hospitalization 10/02/22. He was using nicotine patches but says he quit using. He says he is able to deal with his urges to smoke. He says these episodes are worse if he is around someone smoking so he tries to avoid this. No one in his family smokes. He and his son-in-law quit at the same time. He reports sleeping well. He is retired from [employer]. 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 He has a lot of farm animals to care for and he says he really enjoys being outside  caring for his animals and taking care of his farm. He is currently not exercising but says he does do a lot of walking on his farm. His goals for the program are to improve his stamina and to feel better overall and be able to get back to his normal activities. He has no barriers identified to participate in the program.    Expected Outcomes Short Term: attend the program consistently; Long Term: Improve his stamina and feel better.    Continue Psychosocial Services  Follow up required by staff             Psychosocial Re-Evaluation:  Psychosocial Re-Evaluation     Row Name 11/26/22 1103 12/24/22 0956 01/14/23 1017         Psychosocial Re-Evaluation  Current issues with Current Depression;Current Psychotropic Meds Current Depression;Current Psychotropic Meds;Current Stress Concerns Current Psychotropic Meds;Current Stress Concerns     Comments Patient is doing very well in the program. He says he feels like he is making a lot of progress and is meeting his goals. His strength and stamina are improving and he is able to do all the activities he wants to do without difficulty. He continues to remain tobacco free. He says he still has urges at times but these are decreasing. He is sleeping well and reports no stress concerns. He continue to take celexa to manage his depression and feels it is managed well. We will continue to monitor his progress. Jaxsyn is doing well in rehab. He missed some from being sick and then last week his mother passed.  He is happy for her as she is no longer suffering, but now dealing with his grief and affairs of her state.  Otherwise, he is feeling better and doing well.  He denies any depression symptoms.  He still struggles with sleep as he wakes around 1-2am and difficultly to get back to sleep.  He is using melatonin to help. Micahel is doing well in rehab.  He is feeling better overall.  He is sleeping well despite multiple trips to bathroom at night.  He is  usually abl to get back to sleep. He is feeling well balanced currently.  He enjoys rehab and walking at home.     Expected Outcomes Short Term Goal: Continue to attend sessions and get stronger and abstain from smoking. Long Term: Continue to abstain from smoking and continue to keep his depression mananged. Short: Conitnue to cope with loss of mother Long: Conitnue to exercise for mental boost Short: Continue to walk for mental boost LOng: conitnue to sleep well.     Interventions Encouraged to attend Cardiac Rehabilitation for the exercise;Relaxation education;Stress management education Stress management education;Encouraged to attend Cardiac Rehabilitation for the exercise Stress management education;Encouraged to attend Cardiac Rehabilitation for the exercise     Continue Psychosocial Services  Follow up required by staff Follow up required by staff --              Psychosocial Discharge (Final Psychosocial Re-Evaluation):  Psychosocial Re-Evaluation - 01/14/23 1017       Psychosocial Re-Evaluation   Current issues with Current Psychotropic Meds;Current Stress Concerns    Comments Markeese is doing well in rehab.  He is feeling better overall.  He is sleeping well despite multiple trips to bathroom at night.  He is usually abl to get back to sleep. He is feeling well balanced currently.  He enjoys rehab and walking at home.    Expected Outcomes Short: Continue to walk for mental boost LOng: conitnue to sleep well.    Interventions Stress management education;Encouraged to attend Cardiac Rehabilitation for the exercise             Vocational Rehabilitation: Provide vocational rehab assistance to qualifying candidates.   Vocational Rehab Evaluation & Intervention:  Vocational Rehab - 11/12/22 1506       Initial Vocational Rehab Evaluation & Intervention   Assessment shows need for Vocational Rehabilitation No      Vocational Rehab Re-Evaulation   Comments Patient is retired.              Education: Education Goals: Education classes will be provided on a weekly basis, covering required topics. Participant will state understanding/return demonstration of topics presented.  Learning Barriers/Preferences:  Learning  Barriers/Preferences - 11/12/22 1505       Learning Barriers/Preferences   Learning Barriers None    Learning Preferences Audio             Education Topics: Hypertension, Hypertension Reduction -Define heart disease and high blood pressure. Discus how high blood pressure affects the body and ways to reduce high blood pressure.   Exercise and Your Heart -Discuss why it is important to exercise, the FITT principles of exercise, normal and abnormal responses to exercise, and how to exercise safely. Flowsheet Row CARDIAC REHAB PHASE II EXERCISE from 01/23/2023 in [place] CARDIAC REHABILITATION  Date 12/24/22  Educator [facility]  Instruction Review Code 1- Verbalizes Understanding       Angina -Discuss definition of angina, causes of angina, treatment of angina, and how to decrease risk of having angina.   Cardiac Medications -Review what the following cardiac medications are used for, how they affect the body, and side effects that may occur when taking the medications.  Medications include Aspirin, Beta blockers, calcium channel blockers, ACE Inhibitors, angiotensin receptor blockers, diuretics, digoxin, and antihyperlipidemics. Flowsheet Row CARDIAC REHAB PHASE II EXERCISE from 11/26/2022 in [place] CARDIAC REHABILITATION  Date 11/26/22  Educator DJ  Instruction Review Code 1- Verbalizes Understanding       Congestive Heart Failure -Discuss the definition of CHF, how to live with CHF, the signs and symptoms of CHF, and how keep track of weight and sodium intake.   Heart Disease and Intimacy -Discus the effect sexual activity has on the heart, how changes occur during intimacy as we age, and safety during sexual  activity. Flowsheet Row CARDIAC REHAB PHASE II EXERCISE from 01/23/2023 in [place] CARDIAC REHABILITATION  Date 12/31/22  Educator HB  Instruction Review Code 1- Verbalizes Understanding       Smoking Cessation / COPD -Discuss different methods to quit smoking, the health benefits of quitting smoking, and the definition of COPD. Flowsheet Row CARDIAC REHAB PHASE II EXERCISE from 01/23/2023 in [place] CARDIAC REHABILITATION  Date 12/03/22  Educator [facility]  Instruction Review Code 2- Demonstrated Understanding       Nutrition I: Fats -Discuss the types of cholesterol, what cholesterol does to the heart, and how cholesterol levels can be controlled. Flowsheet Row CARDIAC REHAB PHASE II EXERCISE from 01/23/2023 in [place] CARDIAC REHABILITATION  Date 01/14/23  Educator [facility]  Instruction Review Code 1- Verbalizes Understanding       Nutrition II: Labels -Discuss the different components of food labels and how to read food label Flowsheet Row CARDIAC REHAB PHASE II EXERCISE from 01/23/2023 in [place] CARDIAC REHABILITATION  Date 01/14/23  Educator [facility]  Instruction Review Code 1- Verbalizes Understanding       Heart Parts/Heart Disease and PAD -Discuss the anatomy of the heart, the pathway of blood circulation through the heart, and these are affected by heart disease.   Stress I: Signs and Symptoms -Discuss the causes of stress, how stress may lead to anxiety and depression, and ways to limit stress. Flowsheet Row CARDIAC REHAB PHASE II EXERCISE from 01/23/2023 in [place] CARDIAC REHABILITATION  Date 01/21/23  Educator [facility]  Instruction Review Code 1- Verbalizes Understanding       Stress II: Relaxation -Discuss different types of relaxation techniques to limit stress. Flowsheet Row CARDIAC REHAB PHASE II EXERCISE from 01/23/2023 in [place] CARDIAC REHABILITATION  Date 01/21/23  Educator [facility]  Instruction Review Code 1- [employer]  Signs of Stroke / TIA -Discuss definition of a stroke, what the signs and symptoms are of a stroke, and how to identify when someone is having stroke.   Knowledge Questionnaire Score:  Knowledge Questionnaire Score - 01/30/23 0927       Knowledge Questionnaire Score   Pre Score 20/24    Post Score 23/24             Core Components/Risk Factors/Patient Goals at Admission:  Personal Goals and Risk Factors at Admission - 11/12/22 1506       Core Components/Risk Factors/Patient Goals on Admission    Weight Management Weight Maintenance    Hypertension Yes    Intervention Provide education on lifestyle modifcations including regular physical activity/exercise, weight management, moderate sodium restriction and increased consumption of fresh fruit, vegetables, and low fat dairy, alcohol moderation, and smoking cessation.;Monitor prescription use compliance.    Expected Outcomes Short Term: Continued assessment and intervention until BP is < 140/42mm HG in hypertensive participants. < 130/35mm HG in hypertensive participants with diabetes, heart failure or chronic kidney disease.;Long Term: Maintenance of blood pressure at goal levels.    Lipids Yes    Intervention Provide education and support for participant on nutrition & aerobic/resistive exercise along with prescribed medications to achieve LDL 70mg , HDL >40mg .    Expected Outcomes Short Term: Participant states understanding of desired cholesterol values and is compliant with medications prescribed. Participant is following exercise prescription and nutrition guidelines.;Long Term: Cholesterol controlled with medications as prescribed, with individualized exercise RX and with personalized nutrition plan. Value goals: LDL < 70mg , HDL > 40 mg.             Core Components/Risk Factors/Patient Goals Review:   Goals and Risk Factor Review     Row Name 11/26/22 0956 12/24/22 1001 01/14/23 1125         Core  Components/Risk Factors/Patient Goals Review   Personal Goals Review Weight Management/Obesity;Hypertension;Lipids Weight Management/Obesity;Hypertension;Lipids Weight Management/Obesity;Hypertension;Lipids     Review Patient is doing well in the program with consistent attendance. His goal for his weight is to remain under 210 lbs. He is currently at 207 lbs so he is pleased with this. He is trying to watch his diet and portion control. He says he feels great and is able to do all he wants to do. His blood pressure is above goal averaging 140 to 150 systolic. He says he does check his blood pressure at home. He says he usually gets 150/60. He says he is taking his medications as prescribed and he takes his blood pressure medication at night because they make him drowsy. I told him we were going to notiffy his cardiologist of his readings. He requested we reach out to Robin Searing, FNP as this is the provider he has seen the most. We will continue to monitor his progress in the program. Kais is doing well in rehab.  He really wants to get below 210 lb.  However, his weight is starting to increase as he finds when he is active he wants to eat more. We talked about allowing body to adjust to burning off stores versus eating more to compensate.  His pressures are doing well and he continues to check them at home.  He is feeling well managed on his medications and good overall. Windel is doing well in rehab.  His weight is usually up in the beginning of the week from weekend eating.Overall, it is coming down.His pressures are doing well.  He is  feeling good on his meds.     Expected Outcomes Short Term: Continue to monitor b/p at home. Long term: Continue to be compliant with his medications and with weight management. Short: Conitnue to work on weight loss long; Conitnue to montior risk factors Short: Continue to keep eye on blood pressures Long: Continue to monitor risk factors              Core  Components/Risk Factors/Patient Goals at Discharge (Final Review):   Goals and Risk Factor Review - 01/14/23 1125       Core Components/Risk Factors/Patient Goals Review   Personal Goals Review Weight Management/Obesity;Hypertension;Lipids    Review Aakash is doing well in rehab.  His weight is usually up in the beginning of the week from weekend eating.Overall, it is coming down.His pressures are doing well.  He is feeling good on his meds.    Expected Outcomes Short: Continue to keep eye on blood pressures Long: Continue to monitor risk factors             ITP Comments:  ITP Comments     Row Name 11/12/22 1611 12/10/22 0754 01/07/23 0649 02/04/23 0651 02/06/23 1240   ITP Comments Patient attend orientation today.  Patient is attendingCardiac Rehabilitation Program.  Documentation for diagnosis can be found in [facility] 10/10/22.  Reviewed medical chart, RPE/RPD, gym safety, and program guidelines.  Patient was fitted to equipment they will be using during rehab.  Patient is scheduled to start exercise on 11/17/22 at 915.   Initial ITP created and sent for review and signature by Dr. Dina Rich, Medical Director for Cardiac Rehabilitation Program. 30 day review completed. ITP sent to Dr. Dina Rich, Medical Director of Cardiac Rehab. Continue with ITP unless changes are made by physician.   Pt called out yesterday, exposed to COVID on Monday, c/o headache, encouraged to test and keep [place] posted. 30 day review completed. ITP sent to Dr. Dina Rich, Medical Director of Cardiac Rehab. Continue with ITP unless changes are made by physician. 30 day review completed. ITP sent to Dr. Dina Rich, Medical Director of Cardiac Rehab. Continue with ITP unless changes are made by physician. Suhaas is still sick, but would like to go ahead and graduate today.  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  Discharge ITP created and sent to medical director.             Comments: Discharge ITP

## 2023-02-06 NOTE — Progress Notes (Signed)
Discharge Progress Report  Patient Details  Name: Jon Jackson MRN: [id number] Date of Birth: 1950-08-12 Referring Provider:   Flowsheet Row CARDIAC REHAB PHASE II ORIENTATION from 11/12/2022 in [facility] CARDIAC REHABILITATION  Referring Provider Dr. Laneta Simmers        Number of Visits: 32  Reason for Discharge:  Patient reached a stable level of exercise. Patient independent in their exercise. Patient has met program and personal goals.  Smoking History:  Social History   Tobacco Use  Smoking Status Some Days   Current packs/day: 0.25   Average packs/day: 0.3 packs/day for 47.0 years (11.8 ttl pk-yrs)   Types: Cigars, Cigarettes  Smokeless Tobacco Never  Tobacco Comments   Cigars- chews on tips    Diagnosis:  NSTEMI (non-ST elevated myocardial infarction) (HCC)  S/P CABG x 1  S/P aortic valve replacement with bioprosthetic valve   Initial Exercise Prescription:  Initial Exercise Prescription - 11/12/22 1600       Date of Initial Exercise RX and Referring Provider   Date 11/12/22    Referring Provider Dr. Laneta Simmers      Oxygen   Maintain Oxygen Saturation 88% or higher      Treadmill   MPH 2    Grade 0.5    Minutes 15      REL-XR   Level 2    Speed 50    Minutes 15      Prescription Details   Frequency (times per week) 3    Duration Progress to 30 minutes of continuous aerobic without signs/symptoms of physical distress      Intensity   THRR 40-80% of Max Heartrate 97-131    Ratings of Perceived Exertion 11-13      Resistance Training   Training Prescription Yes    Weight 4    Reps 10-15             Discharge Exercise Prescription (Final Exercise Prescription Changes):  Exercise Prescription Changes - 01/12/23 1000       Response to Exercise   Blood Pressure (Admit) 170/80    Blood Pressure (Exit) 130/64    Heart Rate (Admit) 103 bpm    Heart Rate (Exercise) 102 bpm    Heart Rate (Exit) 89 bpm    Rating of Perceived Exertion  (Exercise) 13    Duration Continue with 30 min of aerobic exercise without signs/symptoms of physical distress.    Intensity THRR unchanged      Progression   Progression Continue to progress workloads to maintain intensity without signs/symptoms of physical distress.      Resistance Training   Training Prescription Yes    Weight 5lbs    Reps 10-15      Treadmill   MPH 2.3    Grade 3    Minutes 15    METs 3.71      NuStep   Level 3    SPM 77    Minutes 15    METs 2      Home Exercise Plan   Plans to continue exercise at Home (comment)    Frequency Add 2 additional days to program exercise sessions.      Oxygen   Maintain Oxygen Saturation 88% or higher             Functional Capacity:  6 Minute Walk     Row Name 11/12/22 1603 01/26/23 0942       6 Minute Walk   Phase Initial Discharge    Distance  1600 feet 1510 feet  initial recalculated to 1440 ft    Distance % Change -- 4.9 %    Distance Feet Change -- 70 ft    Walk Time 6 minutes 6 minutes    # of Rest Breaks 0 0    MPH 3.03 2.86    METS 3.11 3.13    RPE 11 13    VO2 Peak 10.9 10.97    Symptoms No No    Resting HR 63 bpm 75 bpm    Resting BP 160/70 120/64    Resting Oxygen Saturation  96 % --    Exercise Oxygen Saturation  during 6 min walk 96 % --    Max Ex. HR 77 bpm 94 bpm    Max Ex. BP 156/68 162/62    2 Minute Post BP 146/64 --             Psychological, QOL, Others - Outcomes: PHQ 2/9:    01/30/2023    9:28 AM 11/18/2022   10:33 AM 11/12/2022    3:02 PM 10/17/2022    9:13 AM 07/10/2022   10:50 AM  Depression screen PHQ 2/9  Decreased Interest 1 1 0 1 1  Down, Depressed, Hopeless 0 0 0 0 0  PHQ - 2 Score 1 1 0 1 1  Altered sleeping 0 0 0 0 0  Tired, decreased energy 1 1 1 1 1   Change in appetite 1 0 0 0 0  Feeling bad or failure about yourself  0 0 0 0 0  Trouble concentrating 1 0 1 1 0  Moving slowly or fidgety/restless 0 0 0 0 0  Suicidal thoughts 0 0 0 0 0  PHQ-9 Score 4 2  2 3 2   Difficult doing work/chores Somewhat difficult  Not difficult at all Not difficult at all Somewhat difficult    Quality of Life:  Quality of Life - 01/30/23 0926       Quality of Life   Select Quality of Life      Quality of Life Scores   Health/Function Pre 19.39 %    Health/Function Post 25.73 %    Health/Function % Change 32.7 %    Socioeconomic Pre 21.64 %    Socioeconomic Post 22.08 %    Socioeconomic % Change  2.03 %    Psych/Spiritual Pre 20.86 %    Psych/Spiritual Post 21.79 %    Psych/Spiritual % Change 4.46 %    Family Pre 27.6 %    Family Post 28.13 %    Family % Change 1.92 %    GLOBAL Pre 21.42 %    GLOBAL Post 24.48 %    GLOBAL % Change 14.29 %                  Nutrition & Weight - Outcomes:  Pre Biometrics - 11/12/22 1607       Pre Biometrics   Height 5\' 10"  (1.778 m)    Weight 210 lb 1.6 oz (95.3 kg)    Waist Circumference 42 inches    Hip Circumference 41 inches    Waist to Hip Ratio 1.02 %    BMI (Calculated) 30.15    Grip Strength 22.9 kg    Flexibility 0 in    Single Leg Stand 21.64 seconds             Post Biometrics - 01/26/23 0943        Post  Biometrics   Height 5\' 10"  (1.778  m)    Weight 218 lb (98.9 kg)    Waist Circumference 40 inches    Hip Circumference 42 inches    Waist to Hip Ratio 0.95 %    BMI (Calculated) 31.28    Grip Strength 27 kg    Single Leg Stand 30 seconds            Education Questionnaire Score:  Knowledge Questionnaire Score - 01/30/23 0927       Knowledge Questionnaire Score   Pre Score 20/24    Post Score 23/24

## 2023-02-18 ENCOUNTER — Other Ambulatory Visit: Payer: Self-pay | Admitting: Family Medicine

## 2023-02-18 DIAGNOSIS — F320 Major depressive disorder, single episode, mild: Secondary | ICD-10-CM

## 2023-02-23 ENCOUNTER — Ambulatory Visit (INDEPENDENT_AMBULATORY_CARE_PROVIDER_SITE_OTHER): Payer: Medicare Other | Admitting: Family Medicine

## 2023-02-23 VITALS — BP 168/74 | HR 70 | Temp 98.2°F | Ht 70.0 in | Wt 216.6 lb

## 2023-02-23 DIAGNOSIS — I10 Essential (primary) hypertension: Secondary | ICD-10-CM | POA: Diagnosis not present

## 2023-02-23 DIAGNOSIS — F320 Major depressive disorder, single episode, mild: Secondary | ICD-10-CM | POA: Diagnosis not present

## 2023-02-23 DIAGNOSIS — E785 Hyperlipidemia, unspecified: Secondary | ICD-10-CM | POA: Diagnosis not present

## 2023-02-23 MED ORDER — LOSARTAN POTASSIUM 100 MG PO TABS: 100.0000 mg | ORAL_TABLET | Freq: Every day | ORAL | 1 refills | Status: DC

## 2023-02-23 MED ORDER — REPATHA SURECLICK 140 MG/ML ~~LOC~~ SOAJ: 140.0000 mg | SUBCUTANEOUS | 2 refills | Status: DC

## 2023-02-23 MED ORDER — CITALOPRAM HYDROBROMIDE 40 MG PO TABS
40.0000 mg | ORAL_TABLET | Freq: Every day | ORAL | 1 refills | Status: DC
Start: 2023-02-23 — End: 2023-05-21

## 2023-02-23 NOTE — Assessment & Plan Note (Signed)
Patient describing little interest in doing things/motivation.  Informed him that this is most likely coming from depression.  Increasing Celexa.

## 2023-02-23 NOTE — Progress Notes (Signed)
Subjective:  Patient ID: Jon Jackson, male    DOB: 16-Nov-1950  Age: 72 y.o. MRN: [id number]  CC:  Follow up  HPI:  72 year old male presents for follow up.   Patient has completed cardiac rehab.  No chest pain or shortness of breath.  Patient reports decrease in drive/interest in doing things.  He is concerned that this is related to his medication.  Will discuss today.  Patient states that he has some cramping and myalgias from Lipitor.  BP elevated here today.  Compliant with losartan 50 mg daily.    Patient Active Problem List   Diagnosis Date Noted   S/P aortic valve replacement with bioprosthetic valve 10/10/2022   NSTEMI (non-ST elevated myocardial infarction) (HCC) 10/02/2022   Hyperlipidemia 07/10/2022   S/P lumbar fusion 11/19/2020   Osteopenia 11/07/2020   Essential hypertension 11/18/2017   Atrial fibrillation (HCC)    Depression 04/08/2013   Allergic rhinitis 12/12/2012   GERD (gastroesophageal reflux disease) 06/21/2012    Social Hx   Social History   Socioeconomic History   Marital status: Married    Spouse name: Not on file   Number of children: Not on file   Years of education: Not on file   Highest education level: 11th grade  Occupational History   Not on file  Tobacco Use   Smoking status: Some Days    Current packs/day: 0.25    Average packs/day: 0.3 packs/day for 47.0 years (11.8 ttl pk-yrs)    Types: Cigars, Cigarettes   Smokeless tobacco: Never   Tobacco comments:    Cigars- chews on tips  Vaping Use   Vaping status: Never Used  Substance and Sexual Activity   Alcohol use: Yes    Alcohol/week: 12.0 standard drinks of alcohol    Types: 12 Cans of beer per week    Comment: daily 3-4 in the summer; soemtimes more on weekends.   Drug use: No   Sexual activity: Yes  Other Topics Concern   Not on file  Social History Narrative   Not on file   Social Determinants of Health   Financial Resource Strain: Low Risk  (02/22/2023)    Overall Financial Resource Strain (CARDIA)    Difficulty of Paying Living Expenses: Not very hard  Food Insecurity: No Food Insecurity (02/22/2023)   Hunger Vital Sign    Worried About Running Out of Food in the Last Year: Never true    Ran Out of Food in the Last Year: Never true  Transportation Needs: No Transportation Needs (02/22/2023)   PRAPARE - Administrator, Civil Service (Medical): No    Lack of Transportation (Non-Medical): No  Physical Activity: Sufficiently Active (02/22/2023)   Exercise Vital Sign    Days of Exercise per Week: 5 days    Minutes of Exercise per Session: 30 min  Stress: Stress Concern Present (02/22/2023)   [employer] of Occupational Health - Occupational Stress Questionnaire    Feeling of Stress : To some extent  Social Connections: Moderately Isolated (02/22/2023)   Social Connection and Isolation Panel [NHANES]    Frequency of Communication with Friends and Family: Twice a week    Frequency of Social Gatherings with Friends and Family: Once a week    Attends Religious Services: Never    Database administrator or Organizations: No    Attends Banker Meetings: Never    Marital Status: Married    Review of Systems Per HPI  Objective:  BP (!) 168/74   Pulse 70   Temp 98.2 F (36.8 C)   Ht 5\' 10"  (1.778 m)   Wt 216 lb 9.6 oz (98.2 kg)   SpO2 96%   BMI 31.08 kg/m      02/23/2023   11:33 AM 02/23/2023   11:04 AM 02/02/2023    9:41 AM  BP/Weight  Systolic BP 168 164 180  Diastolic BP 74 73 88  Wt. (Lbs)  216.6 220  BMI  31.08 kg/m2 31.57 kg/m2    Physical Exam Vitals and nursing note reviewed.  Constitutional:      Appearance: Normal appearance.  Cardiovascular:     Rate and Rhythm: Normal rate and regular rhythm.  Pulmonary:     Effort: Pulmonary effort is normal.     Breath sounds: Normal breath sounds.  Neurological:     Mental Status: He is alert.  Psychiatric:        Mood and Affect: Mood  normal.        Behavior: Behavior normal.     Lab Results  Component Value Date   WBC 7.9 11/18/2022   HGB 13.5 11/18/2022   HCT 42.3 11/18/2022   PLT 287 11/18/2022   GLUCOSE 111 (H) 10/13/2022   CHOL 135 02/02/2023   TRIG 77 02/02/2023   HDL 49 02/02/2023   LDLCALC 71 02/02/2023   ALT 26 02/02/2023   AST 21 02/02/2023   NA 136 10/13/2022   K 4.1 10/13/2022   CL 102 10/13/2022   CREATININE 1.07 10/13/2022   BUN 18 10/13/2022   CO2 26 10/13/2022   TSH 1.520 02/24/2017   PSA 0.55 04/07/2014   INR 1.4 (H) 10/10/2022   HGBA1C 5.2 10/09/2022     Assessment & Plan:   Problem List Items Addressed This Visit       Cardiovascular and Mediastinum   Essential hypertension - Primary    Uncontrolled.  Increasing losartan.      Relevant Medications   Evolocumab (REPATHA SURECLICK) 140 MG/ML SOAJ   losartan (COZAAR) 100 MG tablet     Other   Hyperlipidemia    Having issues with statin.  Will see if we can get Repatha approved.      Relevant Medications   Evolocumab (REPATHA SURECLICK) 140 MG/ML SOAJ   losartan (COZAAR) 100 MG tablet   Depression    Patient describing little interest in doing things/motivation.  Informed him that this is most likely coming from depression.  Increasing Celexa.      Relevant Medications   citalopram (CELEXA) 40 MG tablet    Meds ordered this encounter  Medications   citalopram (CELEXA) 40 MG tablet    Sig: Take 1 tablet (40 mg total) by mouth daily.    Dispense:  90 tablet    Refill:  1   Evolocumab (REPATHA SURECLICK) 140 MG/ML SOAJ    Sig: Inject 140 mg into the skin every 14 (fourteen) days.    Dispense:  2 mL    Refill:  2   losartan (COZAAR) 100 MG tablet    Sig: Take 1 tablet (100 mg total) by mouth daily.    Dispense:  90 tablet    Refill:  1    Follow-up:  6 weeks  Aden Youngman Adriana Simas DO [facility]

## 2023-02-23 NOTE — Patient Instructions (Addendum)
Celexa increased.   I will see if pharmacy can get Repatha improved.  Monitor BP at home.  Losartan increased.  Follow up in 6 weeks.

## 2023-02-23 NOTE — Assessment & Plan Note (Signed)
Having issues with statin.  Will see if we can get Repatha approved.

## 2023-02-23 NOTE — Assessment & Plan Note (Signed)
Uncontrolled.  Increasing losartan.

## 2023-02-25 ENCOUNTER — Telehealth: Payer: Self-pay | Admitting: Pharmacist

## 2023-02-25 NOTE — Telephone Encounter (Signed)
  Repatha PA submitted H/o NSTEMI can't tolerate statin

## 2023-03-05 NOTE — Telephone Encounter (Signed)
Repatha approved Please let patient and pharmacy know Copay will likely be $47/month and escalate throughout the year (given medicare) If patient needs assistance, I can help with the [employer] grant (free repatha!!)

## 2023-03-06 NOTE — Telephone Encounter (Signed)
Pt has been informed repatha  was approved

## 2023-04-03 ENCOUNTER — Ambulatory Visit
Admission: RE | Admit: 2023-04-03 | Discharge: 2023-04-03 | Disposition: A | Discharge status: Home / Self Care | Payer: Medicare Other | Source: Ambulatory Visit | Attending: Nurse Practitioner | Admitting: Nurse Practitioner

## 2023-04-03 VITALS — BP 148/77 | HR 77 | Temp 98.7°F | Resp 18

## 2023-04-03 DIAGNOSIS — J0140 Acute pansinusitis, unspecified: Secondary | ICD-10-CM | POA: Diagnosis not present

## 2023-04-03 MED ORDER — FLUTICASONE PROPIONATE 50 MCG/ACT NA SUSP: 2.0000 | Freq: Every day | NASAL | 0 refills | Status: DC

## 2023-04-03 MED ORDER — AMOXICILLIN-POT CLAVULANATE 875-125 MG PO TABS: 1.0000 | ORAL_TABLET | Freq: Two times a day (BID) | ORAL | 0 refills | Status: DC

## 2023-04-03 NOTE — Discharge Instructions (Signed)
 Take medication as directed.  Continue Coricidin that you are currently taking. Increase fluids and get plenty of rest. May take over-the-counter Tylenol  as needed for pain, fever, or general discomfort. Recommend normal saline nasal spray to help with nasal congestion throughout the day. For your cough, it may be helpful to use a humidifier at bedtime during sleep. If symptoms do not improve with this treatment, you may follow-up in this clinic or with your PCP for further evaluation. Follow-up as needed.

## 2023-04-03 NOTE — ED Provider Notes (Signed)
 [facility]    CSN: [id number] Arrival date & time: 04/03/23  1347      History   Chief Complaint Chief Complaint  Patient presents with   Cough    Entered by patient    HPI Jon Jackson is a 73 y.o. male.   The history is provided by the patient.   Patient presents with a 1 week history of headache, sinus pressure, nasal congestion, bilateral ear pressure, and a mild cough.  Patient denies fever, chills, ear drainage, wheezing, difficulty breathing, chest pain, abdominal pain, nausea, vomiting, diarrhea, or rash.  Patient reports that he has been taking ibuprofen , Tylenol , and Coricidin with minimal relief.  States that he had open heart surgery in July of last year.  Patient with underlying history of seasonal allergies.  Past Medical History:  Diagnosis Date   Anemia    as a child   Anxiety    Arthritis    Atrial fibrillation (HCC)    Basal cell carcinoma    head   Coronary artery disease    Depression    Dysrhythmia    GERD (gastroesophageal reflux disease)    Heart murmur    Hyperlipidemia    Myocardial infarction (HCC) 10/02/2022   Pneumonia 02/2022   Squamous carcinoma     Patient Active Problem List   Diagnosis Date Noted   S/P aortic valve replacement with bioprosthetic valve 10/10/2022   NSTEMI (non-ST elevated myocardial infarction) (HCC) 10/02/2022   Hyperlipidemia 07/10/2022   S/P lumbar fusion 11/19/2020   Osteopenia 11/07/2020   Essential hypertension 11/18/2017   Atrial fibrillation (HCC)    Depression 04/08/2013   Allergic rhinitis 12/12/2012   GERD (gastroesophageal reflux disease) 06/21/2012    Past Surgical History:  Procedure Laterality Date   AORTIC VALVE REPLACEMENT N/A 10/10/2022   Procedure: AORTIC VALVE REPLACEMENT USING 25 MM INSPIRIS AORTIC VALVE;  Surgeon: Lucas Dorise POUR, MD;  Location: [facility];  Service: Open Heart Surgery;  Laterality: N/A;   ATRIAL FIBRILLATION ABLATION  07/22/2016   ATRIAL FIBRILLATION  ABLATION N/A 07/22/2016   Procedure: Atrial Fibrillation Ablation;  Surgeon: Will Gladis Norton, MD;  Location: [facility];  Service: Cardiovascular;  Laterality: N/A;   BASAL CELL CARCINOMA EXCISION     forehead; top of scalp   BIOPSY  05/27/2017   Procedure: BIOPSY;  Surgeon: Shaaron Lamar HERO, MD;  Location: [facility];  Service: Endoscopy;;  right colon, left colon;   CARDIOVERSION N/A 04/07/2016   Procedure: CARDIOVERSION;  Surgeon: Jayson KANDICE Sierras, MD;  Location: [facility];  Service: Cardiovascular;  Laterality: N/A;   COLECTOMY  1990s   removal of section due to strangulated bowel.   COLONOSCOPY N/A 05/30/2013   Procedure: COLONOSCOPY;  Surgeon: Lamar HERO Shaaron, MD;  Location: [facility];  Service: Endoscopy;  Laterality: N/A;  8:45 AM   COLONOSCOPY     COLONOSCOPY N/A 05/27/2017   Procedure: COLONOSCOPY;  Surgeon: Shaaron Lamar HERO, MD;  Location: [facility];  Service: Endoscopy;  Laterality: N/A;  1:00pm   CORONARY ARTERY BYPASS GRAFT N/A 10/10/2022   Procedure: CORONARY ARTERY BYPASS GRAFTING (CABG) X 1 USING ENDOSCOPICALY HARVESTED RIGHT GREATER SAPHENOUS VEIN;  Surgeon: Lucas Dorise POUR, MD;  Location: [facility];  Service: Open Heart Surgery;  Laterality: N/A;   INGUINAL HERNIA REPAIR Right 1990s   LUMBAR LAMINECTOMY/DECOMPRESSION MICRODISCECTOMY N/A 12/29/2018   Procedure: Decompressive lumbar laminectomy at L3-L4;  Surgeon: Heide Ingle, MD;  Location: [facility];  Service: Orthopedics;  Laterality: N/A;    RIGHT HEART CATH AND CORONARY ANGIOGRAPHY N/A 10/03/2022   Procedure: RIGHT HEART CATH AND CORONARY ANGIOGRAPHY;  Surgeon: Claudene Pacific, MD;  [facility];  Service: Cardiovascular;  Laterality: N/A;   RIGHT/LEFT HEART CATH AND CORONARY ANGIOGRAPHY N/A 10/03/2022   Procedure: RIGHT/LEFT HEART CATH AND CORONARY ANGIOGRAPHY;  Surgeon: Claudene Pacific, MD;  [facility];  Service: Cardiovascular;  Laterality: N/A;   SHOULDER ARTHROSCOPY WITH  ROTATOR CUFF REPAIR AND SUBACROMIAL DECOMPRESSION Left 05/15/2016   Procedure: LEFT SHOULDER ARTHROSCOPY WITH ROTATOR CUFF REPAIR AND SUBACROMIAL DECOMPRESSION AND DISTAL CLAVICLE RESECTION;  Surgeon: Franky Pointer, MD;  Location: [facility];  Service: Orthopedics;  Laterality: Left;  requests 2hrs   SHOULDER SURGERY Right    SQUAMOUS CELL CARCINOMA EXCISION     scalp   TEE WITHOUT CARDIOVERSION N/A 10/10/2022   Procedure: TRANSESOPHAGEAL ECHOCARDIOGRAM;  Surgeon: Lucas Dorise POUR, MD;  Location: [facility];  Service: Open Heart Surgery;  Laterality: N/A;   TONSILLECTOMY         Home Medications    Prior to Admission medications   Medication Sig Start Date End Date Taking? Authorizing Provider  amoxicillin -clavulanate (AUGMENTIN ) 875-125 MG tablet Take 1 tablet by mouth every 12 (twelve) hours. 04/03/23  Yes Leath-Warren, Etta PARAS, NP  fluticasone  (FLONASE ) 50 MCG/ACT nasal spray Place 2 sprays into both nostrils daily. 04/03/23  Yes Leath-Warren, Etta PARAS, NP  aspirin  EC 325 MG tablet Take 1 tablet (325 mg total) by mouth daily. 10/14/22  Yes Gold, Lemond BRAVO, PA-C  atorvastatin  (LIPITOR) 80 MG tablet Take 1 tablet (80 mg total) by mouth daily. 11/20/22  Yes Cook, Jayce G, DO  citalopram  (CELEXA ) 40 MG tablet Take 1 tablet (40 mg total) by mouth daily. 02/23/23  Yes Cook, Jayce G, DO  Evolocumab  (REPATHA  SURECLICK) 140 MG/ML SOAJ Inject 140 mg into the skin every 14 (fourteen) days. 02/23/23  Yes Cook, Jayce G, DO  losartan  (COZAAR ) 100 MG tablet Take 1 tablet (100 mg total) by mouth daily. 02/23/23 05/24/23 Yes Cook, Jayce G, DO  Multiple Vitamin (MULTIVITAMIN) tablet Take 1 tablet by mouth daily.   Yes [provider]  omeprazole  (PRILOSEC ) 40 MG capsule Take 1 capsule by mouth once daily 02/18/23  Yes Cook, Jayce G, DO    Family History Family History  Problem Relation Age of Onset   Colon cancer Brother 38   Throat cancer Brother    Breast cancer Mother     Social History Social  History   Tobacco Use   Smoking status: Some Days    Current packs/day: 0.25    Average packs/day: 0.3 packs/day for 47.0 years (11.8 ttl pk-yrs)    Types: Cigars, Cigarettes   Smokeless tobacco: Never   Tobacco comments:    Cigars- chews on tips  Vaping Use   Vaping status: Never Used  Substance Use Topics   Alcohol use: Yes    Alcohol/week: 12.0 standard drinks of alcohol    Types: 12 Cans of beer per week    Comment: daily 3-4 in the summer; soemtimes more on weekends.   Drug use: No     Allergies   Ultram  [tramadol ]   Review of Systems Review of Systems Per HPI  Physical Exam Triage Vital Signs ED Triage Vitals  Encounter Vitals Group     BP 04/03/23 1417 (!) 148/77     Systolic BP Percentile --      Diastolic BP Percentile --  Pulse Rate 04/03/23 1417 77     Resp 04/03/23 1417 18     Temp 04/03/23 1417 98.7 F (37.1 C)     Temp Source 04/03/23 1417 Oral     SpO2 04/03/23 1417 94 %     Weight --      Height --      Head Circumference --      Peak Flow --      Pain Score 04/03/23 1418 5     Pain Loc --      Pain Education --      Exclude from Growth Chart --    No data found.  Updated Vital Signs BP (!) 148/77 (BP Location: Left Arm)   Pulse 77   Temp 98.7 F (37.1 C) (Oral)   Resp 18   SpO2 94%   Visual Acuity Right Eye Distance:   Left Eye Distance:   Bilateral Distance:    Right Eye Near:   Left Eye Near:    Bilateral Near:     Physical Exam Vitals and nursing note reviewed.  Constitutional:      General: He is not in acute distress.    Appearance: Normal appearance.  HENT:     Head: Normocephalic.     Right Ear: Tympanic membrane, ear canal and external ear normal.     Left Ear: Tympanic membrane, ear canal and external ear normal.     Nose: Congestion present.     Right Turbinates: Enlarged and swollen.     Left Turbinates: Enlarged and swollen.     Right Sinus: Maxillary sinus tenderness and frontal sinus tenderness  present.     Left Sinus: Maxillary sinus tenderness and frontal sinus tenderness present.     Mouth/Throat:     Mouth: Mucous membranes are moist.     Pharynx: Postnasal drip present. No pharyngeal swelling, oropharyngeal exudate, posterior oropharyngeal erythema or uvula swelling.     Comments: Cobblestoning present to posterior oropharynx  Eyes:     Extraocular Movements: Extraocular movements intact.     Conjunctiva/sclera: Conjunctivae normal.     Pupils: Pupils are equal, round, and reactive to light.  Cardiovascular:     Rate and Rhythm: Normal rate and regular rhythm.     Pulses: Normal pulses.     Heart sounds: Normal heart sounds.  Pulmonary:     Effort: Pulmonary effort is normal. No respiratory distress.     Breath sounds: Normal breath sounds. No stridor. No wheezing, rhonchi or rales.  Abdominal:     General: Bowel sounds are normal.     Palpations: Abdomen is soft.     Tenderness: There is no abdominal tenderness.  Musculoskeletal:     Cervical back: Normal range of motion.  Lymphadenopathy:     Cervical: No cervical adenopathy.  Skin:    General: Skin is warm and dry.  Neurological:     General: No focal deficit present.     Mental Status: He is alert and oriented to person, place, and time.  Psychiatric:        Mood and Affect: Mood normal.        Behavior: Behavior normal.      UC Treatments / Results  Labs (all labs ordered are listed, but only abnormal results are displayed) Labs Reviewed - No data to display  EKG   Radiology No results found.  Procedures Procedures (including critical care time)  Medications Ordered in UC Medications - No data to display  Initial  Impression / Assessment and Plan / UC Course  I have reviewed the triage vital signs and the nursing notes.  Pertinent labs & imaging results that were available during my care of the patient were reviewed by me and considered in my medical decision making (see chart for  details).  On exam, lung sounds are clear throughout, room air sats at 94%.  Patient with moderate maxillary and frontal sinus tenderness on exam.  Given patient's risk for low immunity given other comorbidities, will treat for acute pansinusitis with Augmentin  875/125 mg tablets twice daily for the next 7 days.  Fluticasone  50 mcg nasal spray was also prescribed for nasal congestion and runny nose.  Patient advised to continue Coricidin he is currently taking for cough.  Supportive care recommendations were provided and discussed with the patient to include fluids, rest, over-the-counter Tylenol , and use of normal saline nasal spray.  Patient was advised regarding when r follow-up would be indicated.  Patient is in agreement with this plan of care and verbalizes understanding.  All questions were answered.  Patient stable for discharge.  Final Clinical Impressions(s) / UC Diagnoses   Final diagnoses:  Acute pansinusitis, recurrence not specified     Discharge Instructions      Take medication as directed.  Continue Coricidin that you are currently taking. Increase fluids and get plenty of rest. May take over-the-counter Tylenol  as needed for pain, fever, or general discomfort. Recommend normal saline nasal spray to help with nasal congestion throughout the day. For your cough, it may be helpful to use a humidifier at bedtime during sleep. If symptoms do not improve with this treatment, you may follow-up in this clinic or with your PCP for further evaluation. Follow-up as needed.     ED Prescriptions     Medication Sig Dispense Auth. Provider   amoxicillin -clavulanate (AUGMENTIN ) 875-125 MG tablet Take 1 tablet by mouth every 12 (twelve) hours. 14 tablet Leath-Warren, Etta PARAS, NP   fluticasone  (FLONASE ) 50 MCG/ACT nasal spray Place 2 sprays into both nostrils daily. 16 g Leath-Warren, Etta PARAS, NP      PDMP not reviewed this encounter.   Gilmer Etta PARAS, NP 04/03/23  1437

## 2023-04-03 NOTE — ED Triage Notes (Signed)
 Patient presents to the office for cough and bilateral ear pain x 3 days.

## 2023-04-13 ENCOUNTER — Ambulatory Visit (INDEPENDENT_AMBULATORY_CARE_PROVIDER_SITE_OTHER): Payer: Medicare Other | Admitting: Family Medicine

## 2023-04-13 DIAGNOSIS — Z951 Presence of aortocoronary bypass graft: Secondary | ICD-10-CM | POA: Insufficient documentation

## 2023-04-13 DIAGNOSIS — J988 Other specified respiratory disorders: Secondary | ICD-10-CM | POA: Diagnosis not present

## 2023-04-13 DIAGNOSIS — I10 Essential (primary) hypertension: Secondary | ICD-10-CM | POA: Diagnosis not present

## 2023-04-13 MED ORDER — AMLODIPINE BESYLATE 5 MG PO TABS: 5.0000 mg | ORAL_TABLET | Freq: Every day | ORAL | 3 refills | Status: DC

## 2023-04-13 NOTE — Assessment & Plan Note (Signed)
 Doing okay at this time.  Supportive care.

## 2023-04-13 NOTE — Patient Instructions (Signed)
 I have add Amlodipine for the blood pressure in addition to losartan.  Follow-up in 1 month for evaluation of your blood pressure.

## 2023-04-13 NOTE — Assessment & Plan Note (Signed)
 Worsening/exacerbation.  BP markedly elevated here today.  Adding amlodipine.  Follow-up for hypertension check in 1 month.

## 2023-04-13 NOTE — Progress Notes (Signed)
 Subjective:  Patient ID: Jon Jackson, male    DOB: 08/07/1950  Age: 73 y.o. MRN: [id number]  CC:   Chief Complaint  Patient presents with   Sinus Problem    Follow up UC last week    patient questions regarding overall health    Was advised by doctors to not exercise in 40 below wether     HPI:  73 year old male presents for follow-up.  Recent visit at urgent care for sinusitis.  Has completed antibiotic therapy.  Has improved but is still having some ear popping/cracking.  Some productive cough.  No fever.  BP markedly elevated here today.  He states that his blood pressure readings are elevated at home as well.  Compliant with losartan .  Patient Active Problem List   Diagnosis Date Noted   S/P CABG (coronary artery bypass graft) 04/13/2023   Respiratory infection 04/13/2023   S/P aortic valve replacement with bioprosthetic valve 10/10/2022   Hyperlipidemia 07/10/2022   S/P lumbar fusion 11/19/2020   Osteopenia 11/07/2020   Essential hypertension 11/18/2017   Atrial fibrillation (HCC)    Depression 04/08/2013   Allergic rhinitis 12/12/2012   GERD (gastroesophageal reflux disease) 06/21/2012    Social Hx   Social History   Socioeconomic History   Marital status: Married    Spouse name: Not on file   Number of children: Not on file   Years of education: Not on file   Highest education level: 11th grade  Occupational History   Not on file  Tobacco Use   Smoking status: Some Days    Current packs/day: 0.25    Average packs/day: 0.3 packs/day for 47.0 years (11.8 ttl pk-yrs)    Types: Cigars, Cigarettes   Smokeless tobacco: Never   Tobacco comments:    Cigars- chews on tips  Vaping Use   Vaping status: Never Used  Substance and Sexual Activity   Alcohol use: Yes    Alcohol/week: 12.0 standard drinks of alcohol    Types: 12 Cans of beer per week    Comment: daily 3-4 in the summer; soemtimes more on weekends.   Drug use: No   Sexual activity: Yes   Other Topics Concern   Not on file  Social History Narrative   Not on file   Social Drivers of Health   Financial Resource Strain: Low Risk  (02/22/2023)   Overall Financial Resource Strain (CARDIA)    Difficulty of Paying Living Expenses: Not very hard  Food Insecurity: No Food Insecurity (02/22/2023)   Hunger Vital Sign    Worried About Running Out of Food in the Last Year: Never true    Ran Out of Food in the Last Year: Never true  Transportation Needs: No Transportation Needs (02/22/2023)   PRAPARE - Administrator, Civil Service (Medical): No    Lack of Transportation (Non-Medical): No  Physical Activity: Sufficiently Active (02/22/2023)   Exercise Vital Sign    Days of Exercise per Week: 5 days    Minutes of Exercise per Session: 30 min  Stress: Stress Concern Present (02/22/2023)   [employer] of Occupational Health - Occupational Stress Questionnaire    Feeling of Stress : To some extent  Social Connections: Moderately Isolated (02/22/2023)   Social Connection and Isolation Panel [NHANES]    Frequency of Communication with Friends and Family: Twice a week    Frequency of Social Gatherings with Friends and Family: Once a week    Attends Religious Services: Never  Active Member of Clubs or Organizations: No    Attends Banker Meetings: Never    Marital Status: Married    Review of Systems Per HPI  Objective:  BP (!) 163/72   Pulse 62   Temp 98.2 F (36.8 C)   Ht 5' 10 (1.778 m)   Wt 221 lb (100.2 kg)   SpO2 97%   BMI 31.71 kg/m      04/13/2023   10:14 AM 04/13/2023    9:53 AM 04/03/2023    2:17 PM  BP/Weight  Systolic BP 163 164 148  Diastolic BP 72 74 77  Wt. (Lbs)  221   BMI  31.71 kg/m2     Physical Exam Vitals and nursing note reviewed.  Constitutional:      General: He is not in acute distress.    Appearance: Normal appearance.  HENT:     Head: Normocephalic and atraumatic.     Right Ear: Tympanic membrane  normal.     Left Ear: Tympanic membrane normal.  Eyes:     General:        Right eye: No discharge.        Left eye: No discharge.     Conjunctiva/sclera: Conjunctivae normal.  Cardiovascular:     Rate and Rhythm: Normal rate and regular rhythm.     Heart sounds: Murmur heard.  Pulmonary:     Effort: Pulmonary effort is normal.     Breath sounds: Normal breath sounds. No wheezing or rales.  Neurological:     Mental Status: He is alert.     Lab Results  Component Value Date   WBC 7.9 11/18/2022   HGB 13.5 11/18/2022   HCT 42.3 11/18/2022   PLT 287 11/18/2022   GLUCOSE 111 (H) 10/13/2022   CHOL 135 02/02/2023   TRIG 77 02/02/2023   HDL 49 02/02/2023   LDLCALC 71 02/02/2023   ALT 26 02/02/2023   AST 21 02/02/2023   NA 136 10/13/2022   K 4.1 10/13/2022   CL 102 10/13/2022   CREATININE 1.07 10/13/2022   BUN 18 10/13/2022   CO2 26 10/13/2022   TSH 1.520 02/24/2017   PSA 0.55 04/07/2014   INR 1.4 (H) 10/10/2022   HGBA1C 5.2 10/09/2022     Assessment & Plan:   Problem List Items Addressed This Visit       Cardiovascular and Mediastinum   Essential hypertension   Worsening/exacerbation.  BP markedly elevated here today.  Adding amlodipine .  Follow-up for hypertension check in 1 month.      Relevant Medications   amLODipine  (NORVASC ) 5 MG tablet     Respiratory   Respiratory infection   Doing okay at this time.  Supportive care.       Meds ordered this encounter  Medications   amLODipine  (NORVASC ) 5 MG tablet    Sig: Take 1 tablet (5 mg total) by mouth daily.    Dispense:  90 tablet    Refill:  3    Follow-up:  Return in about 1 month (around 05/14/2023) for HTN follow up.  Jacqulyn Ahle DO [facility]

## 2023-05-02 DIAGNOSIS — Z23 Encounter for immunization: Secondary | ICD-10-CM | POA: Diagnosis not present

## 2023-05-14 ENCOUNTER — Ambulatory Visit: Payer: Medicare Other | Admitting: Family Medicine

## 2023-05-21 ENCOUNTER — Encounter: Payer: Self-pay | Admitting: Family Medicine

## 2023-05-21 ENCOUNTER — Ambulatory Visit: Payer: Medicare Other | Admitting: Family Medicine

## 2023-05-21 VITALS — BP 101/69 | HR 72 | Temp 98.3°F | Ht 70.0 in | Wt 220.0 lb

## 2023-05-21 DIAGNOSIS — F320 Major depressive disorder, single episode, mild: Secondary | ICD-10-CM

## 2023-05-21 DIAGNOSIS — I10 Essential (primary) hypertension: Secondary | ICD-10-CM

## 2023-05-21 DIAGNOSIS — E785 Hyperlipidemia, unspecified: Secondary | ICD-10-CM | POA: Diagnosis not present

## 2023-05-21 DIAGNOSIS — K219 Gastro-esophageal reflux disease without esophagitis: Secondary | ICD-10-CM | POA: Diagnosis not present

## 2023-05-21 MED ORDER — OMEPRAZOLE 40 MG PO CPDR: 40.0000 mg | DELAYED_RELEASE_CAPSULE | Freq: Every day | ORAL | 3 refills | Status: AC | PRN

## 2023-05-21 MED ORDER — LOSARTAN POTASSIUM 100 MG PO TABS: 100.0000 mg | ORAL_TABLET | Freq: Every day | ORAL | 3 refills | Status: DC

## 2023-05-21 MED ORDER — REPATHA SURECLICK 140 MG/ML ~~LOC~~ SOAJ: 140.0000 mg | SUBCUTANEOUS | 3 refills | Status: DC

## 2023-05-21 MED ORDER — CITALOPRAM HYDROBROMIDE 40 MG PO TABS: 40.0000 mg | ORAL_TABLET | Freq: Every day | ORAL | 3 refills | Status: AC

## 2023-05-21 NOTE — Assessment & Plan Note (Signed)
 Stable.  Continue amlodipine and losartan.  Refilled today.

## 2023-05-21 NOTE — Progress Notes (Signed)
 Subjective:  Patient ID: Jon Jackson, male    DOB: [birth year]  Age: 73 y.o. MRN: [id number]  CC:   Chief Complaint  Patient presents with   Follow-up    6 month f/u     HPI:  73 year old male with the below mentioned medical problems presents for follow-up.  Patient states that overall he is doing well.  He reports some recent ear pain.  Would like me to examine his left ear today.  Blood pressure well-controlled.  No recent chest pain.  GERD stable.  Depression stable.  Lipids have been fairly well-controlled on Repatha.  Patient Active Problem List   Diagnosis Date Noted   S/P CABG (coronary artery bypass graft) 04/13/2023   S/P aortic valve replacement with bioprosthetic valve 10/10/2022   Hyperlipidemia 07/10/2022   S/P lumbar fusion 11/19/2020   Osteopenia 11/07/2020   Essential hypertension 11/18/2017   Atrial fibrillation (HCC)    Depression 04/08/2013   Allergic rhinitis 12/12/2012   GERD (gastroesophageal reflux disease) 06/21/2012    Social Hx   Social History   Socioeconomic History   Marital status: Married    Spouse name: Not on file   Number of children: Not on file   Years of education: Not on file   Highest education level: 11th grade  Occupational History   Not on file  Tobacco Use   Smoking status: Some Days    Current packs/day: 0.25    Average packs/day: 0.3 packs/day for 47.0 years (11.8 ttl pk-yrs)    Types: Cigars, Cigarettes   Smokeless tobacco: Never   Tobacco comments:    Cigars- chews on tips  Vaping Use   Vaping status: Never Used  Substance and Sexual Activity   Alcohol use: Yes    Alcohol/week: 12.0 standard drinks of alcohol    Types: 12 Cans of beer per week    Comment: daily 3-4 in the summer; soemtimes more on weekends.   Drug use: No   Sexual activity: Yes  Other Topics Concern   Not on file  Social History Narrative   Not on file   Social Drivers of Health   Financial Resource Strain: Low Risk  (02/22/2023)    Overall Financial Resource Strain (CARDIA)    Difficulty of Paying Living Expenses: Not very hard  Food Insecurity: No Food Insecurity (02/22/2023)   Hunger Vital Sign    Worried About Running Out of Food in the Last Year: Never true    Ran Out of Food in the Last Year: Never true  Transportation Needs: No Transportation Needs (02/22/2023)   PRAPARE - Administrator, Civil Service (Medical): No    Lack of Transportation (Non-Medical): No  Physical Activity: Sufficiently Active (02/22/2023)   Exercise Vital Sign    Days of Exercise per Week: 5 days    Minutes of Exercise per Session: 30 min  Stress: Stress Concern Present (02/22/2023)   [employer] of Occupational Health - Occupational Stress Questionnaire    Feeling of Stress : To some extent  Social Connections: Moderately Isolated (02/22/2023)   Social Connection and Isolation Panel [NHANES]    Frequency of Communication with Friends and Family: Twice a week    Frequency of Social Gatherings with Friends and Family: Once a week    Attends Religious Services: Never    Database administrator or Organizations: No    Attends Banker Meetings: Never    Marital Status: Married    Review of Systems  Per HPI  Objective:  BP 101/69   Pulse 72   Temp 98.3 F (36.8 C)   Ht 5\' 10"  (1.778 m)   Wt 220 lb (99.8 kg)   SpO2 96%   BMI 31.57 kg/m      05/21/2023   12:59 PM 04/13/2023   10:14 AM 04/13/2023    9:53 AM  BP/Weight  Systolic BP 101 163 164  Diastolic BP 69 72 74  Wt. (Lbs) 220  221  BMI 31.57 kg/m2  31.71 kg/m2    Physical Exam Vitals and nursing note reviewed.  Constitutional:      General: He is not in acute distress.    Appearance: Normal appearance.  HENT:     Head: Normocephalic and atraumatic.  Eyes:     General:        Right eye: No discharge.        Left eye: No discharge.     Conjunctiva/sclera: Conjunctivae normal.  Cardiovascular:     Rate and Rhythm: Normal rate and  regular rhythm.     Heart sounds: Murmur heard.  Pulmonary:     Effort: Pulmonary effort is normal.     Breath sounds: Normal breath sounds. No wheezing, rhonchi or rales.  Neurological:     Mental Status: He is alert.  Psychiatric:        Mood and Affect: Mood normal.        Behavior: Behavior normal.     Lab Results  Component Value Date   WBC 7.9 11/18/2022   HGB 13.5 11/18/2022   HCT 42.3 11/18/2022   PLT 287 11/18/2022   GLUCOSE 111 (H) 10/13/2022   CHOL 135 02/02/2023   TRIG 77 02/02/2023   HDL 49 02/02/2023   LDLCALC 71 02/02/2023   ALT 26 02/02/2023   AST 21 02/02/2023   NA 136 10/13/2022   K 4.1 10/13/2022   CL 102 10/13/2022   CREATININE 1.07 10/13/2022   BUN 18 10/13/2022   CO2 26 10/13/2022   TSH 1.520 02/24/2017   PSA 0.55 04/07/2014   INR 1.4 (H) 10/10/2022   HGBA1C 5.2 10/09/2022     Assessment & Plan:  Essential hypertension Assessment & Plan: Stable.  Continue amlodipine and losartan.  Refilled today.  Orders: -     Losartan Potassium; Take 1 tablet (100 mg total) by mouth daily.  Dispense: 90 tablet; Refill: 3  Current mild episode of major depressive disorder, unspecified whether recurrent (HCC) Assessment & Plan: Stable on Celexa.  Orders: -     Citalopram Hydrobromide; Take 1 tablet (40 mg total) by mouth daily.  Dispense: 90 tablet; Refill: 3  Gastroesophageal reflux disease without esophagitis Assessment & Plan: Stable.  Omeprazole refilled.  Orders: -     Omeprazole; Take 1 capsule (40 mg total) by mouth daily as needed.  Dispense: 90 capsule; Refill: 3  Hyperlipidemia, unspecified hyperlipidemia type Assessment & Plan: Last LDL 71.  Continue Repatha.  Refilled.  Orders: -     Repatha SureClick; Inject 140 mg into the skin every 14 (fourteen) days.  Dispense: 6 mL; Refill: 3    Follow-up:  Return in about 6 months (around 11/18/2023).  Everlene Other DO [facility]

## 2023-05-21 NOTE — Assessment & Plan Note (Signed)
 Stable on Celexa.

## 2023-05-21 NOTE — Assessment & Plan Note (Signed)
 Last LDL 71.  Continue Repatha.  Refilled.

## 2023-05-21 NOTE — Assessment & Plan Note (Signed)
Stable. Omeprazole refilled.  

## 2023-05-21 NOTE — Patient Instructions (Signed)
 Continue your medications.  Follow up in 6 months.  Take care  Dr. Adriana Simas

## 2023-07-21 DIAGNOSIS — L570 Actinic keratosis: Secondary | ICD-10-CM | POA: Diagnosis not present

## 2023-07-21 DIAGNOSIS — L821 Other seborrheic keratosis: Secondary | ICD-10-CM | POA: Diagnosis not present

## 2023-07-21 DIAGNOSIS — Z85828 Personal history of other malignant neoplasm of skin: Secondary | ICD-10-CM | POA: Diagnosis not present

## 2023-07-21 DIAGNOSIS — L814 Other melanin hyperpigmentation: Secondary | ICD-10-CM | POA: Diagnosis not present

## 2023-08-26 ENCOUNTER — Encounter: Payer: Self-pay | Admitting: Family Medicine

## 2023-08-26 ENCOUNTER — Ambulatory Visit: Admitting: Family Medicine

## 2023-08-26 VITALS — BP 148/81 | HR 84 | Temp 98.4°F | Ht 70.0 in | Wt 223.0 lb

## 2023-08-26 DIAGNOSIS — Z720 Tobacco use: Secondary | ICD-10-CM

## 2023-08-26 DIAGNOSIS — I10 Essential (primary) hypertension: Secondary | ICD-10-CM

## 2023-08-26 MED ORDER — VARENICLINE TARTRATE (STARTER) 0.5 MG X 11 & 1 MG X 42 PO TBPK: ORAL_TABLET | ORAL | 0 refills | Status: DC

## 2023-08-26 MED ORDER — VARENICLINE TARTRATE 1 MG PO TABS
1.0000 mg | ORAL_TABLET | Freq: Two times a day (BID) | ORAL | 2 refills | Status: DC
Start: 2023-08-26 — End: 2023-11-18

## 2023-08-26 MED ORDER — LOSARTAN POTASSIUM 100 MG PO TABS: 100.0000 mg | ORAL_TABLET | Freq: Every day | ORAL | 3 refills | Status: DC

## 2023-08-26 NOTE — Patient Instructions (Signed)
 Start Chantix .  Pick a quit date and start 1 week prior. On quit date, no more smoking.

## 2023-08-27 DIAGNOSIS — Z720 Tobacco use: Secondary | ICD-10-CM | POA: Insufficient documentation

## 2023-08-27 NOTE — Progress Notes (Signed)
 Subjective:  Patient ID: Jon Jackson, male    DOB: 04-27-1950  Age: 73 y.o. MRN: [id number]  CC:   Chief Complaint  Patient presents with   smoking cessation     Started back smoking 4 to 5 cigarettes per day    HPI:  73 year old male with the below mentioned medical problems presents for evaluation of the above.  Recently started back smoking.  He states that he knows this is a bad thing and he is interested in quitting.  He would like to discuss treatment options today.  Patient Active Problem List   Diagnosis Date Noted   Tobacco abuse 08/27/2023   S/P CABG (coronary artery bypass graft) 04/13/2023   S/P aortic valve replacement with bioprosthetic valve 10/10/2022   Hyperlipidemia 07/10/2022   S/P lumbar fusion 11/19/2020   Osteopenia 11/07/2020   Essential hypertension 11/18/2017   Atrial fibrillation (HCC)    Depression 04/08/2013   Allergic rhinitis 12/12/2012   GERD (gastroesophageal reflux disease) 06/21/2012    Social Hx   Social History   Socioeconomic History   Marital status: Married    Spouse name: Not on file   Number of children: Not on file   Years of education: Not on file   Highest education level: 11th grade  Occupational History   Not on file  Tobacco Use   Smoking status: Some Days    Current packs/day: 0.25    Average packs/day: 0.3 packs/day for 47.0 years (11.8 ttl pk-yrs)    Types: Cigars, Cigarettes   Smokeless tobacco: Never   Tobacco comments:    Cigars- chews on tips  Vaping Use   Vaping status: Never Used  Substance and Sexual Activity   Alcohol use: Yes    Alcohol/week: 12.0 standard drinks of alcohol    Types: 12 Cans of beer per week    Comment: daily 3-4 in the summer; soemtimes more on weekends.   Drug use: No   Sexual activity: Yes  Other Topics Concern   Not on file  Social History Narrative   Not on file   Social Drivers of Health   Financial Resource Strain: Low Risk  (08/25/2023)   Overall Financial  Resource Strain (CARDIA)    Difficulty of Paying Living Expenses: Not very hard  Food Insecurity: No Food Insecurity (08/25/2023)   Hunger Vital Sign    Worried About Running Out of Food in the Last Year: Never true    Ran Out of Food in the Last Year: Never true  Transportation Needs: No Transportation Needs (08/25/2023)   PRAPARE - Administrator, Civil Service (Medical): No    Lack of Transportation (Non-Medical): No  Physical Activity: Sufficiently Active (08/25/2023)   Exercise Vital Sign    Days of Exercise per Week: 5 days    Minutes of Exercise per Session: 90 min  Stress: No Stress Concern Present (08/25/2023)   [employer] of Occupational Health - Occupational Stress Questionnaire    Feeling of Stress : Only a little  Social Connections: Moderately Isolated (08/25/2023)   Social Connection and Isolation Panel [NHANES]    Frequency of Communication with Friends and Family: Three times a week    Frequency of Social Gatherings with Friends and Family: Once a week    Attends Religious Services: Never    Database administrator or Organizations: No    Attends Banker Meetings: Never    Marital Status: Married    Review of Systems Per  HPI  Objective:  BP (!) 148/81   Pulse 84   Temp 98.4 F (36.9 C)   Ht 5\' 10"  (1.778 m)   Wt 223 lb (101.2 kg)   SpO2 97%   BMI 32.00 kg/m      08/26/2023    2:43 PM 08/26/2023    2:19 PM 05/21/2023   12:59 PM  BP/Weight  Systolic BP 148 169 101  Diastolic BP 81 78 69  Wt. (Lbs)  223 220  BMI  32 kg/m2 31.57 kg/m2    Physical Exam Vitals and nursing note reviewed.  Constitutional:      General: He is not in acute distress.    Appearance: Normal appearance.  HENT:     Head: Normocephalic and atraumatic.  Pulmonary:     Effort: Pulmonary effort is normal. No respiratory distress.  Neurological:     Mental Status: He is alert.  Psychiatric:        Mood and Affect: Mood normal.        Behavior:  Behavior normal.     Lab Results  Component Value Date   WBC 7.9 11/18/2022   HGB 13.5 11/18/2022   HCT 42.3 11/18/2022   PLT 287 11/18/2022   GLUCOSE 111 (H) 10/13/2022   CHOL 135 02/02/2023   TRIG 77 02/02/2023   HDL 49 02/02/2023   LDLCALC 71 02/02/2023   ALT 26 02/02/2023   AST 21 02/02/2023   NA 136 10/13/2022   K 4.1 10/13/2022   CL 102 10/13/2022   CREATININE 1.07 10/13/2022   BUN 18 10/13/2022   CO2 26 10/13/2022   TSH 1.520 02/24/2017   PSA 0.55 04/07/2014   INR 1.4 (H) 10/10/2022   HGBA1C 5.2 10/09/2022     Assessment & Plan:  Tobacco abuse Assessment & Plan: Starting on Chantix .  Orders: -     Varenicline  Tartrate (Starter); Days 1 to 3: 0.5 mg once daily. Days 4 to 7: 0.5 mg twice daily. Maintenance (day 8 and later): 1 mg twice daily.  Dispense: 53 each; Refill: 0 -     Varenicline  Tartrate; Take 1 tablet (1 mg total) by mouth 2 (two) times daily.  Dispense: 180 tablet; Refill: 2  Essential hypertension -     Losartan  Potassium; Take 1 tablet (100 mg total) by mouth daily.  Dispense: 90 tablet; Refill: 3    Michella Detjen DO [facility]

## 2023-08-27 NOTE — Assessment & Plan Note (Signed)
Starting on Chantix.

## 2023-10-05 ENCOUNTER — Encounter: Payer: Self-pay | Admitting: Physician Assistant

## 2023-10-05 ENCOUNTER — Ambulatory Visit: Admitting: Physician Assistant

## 2023-10-05 VITALS — BP 134/70 | HR 85 | Temp 99.1°F | Ht 70.0 in | Wt 225.0 lb

## 2023-10-05 DIAGNOSIS — J329 Chronic sinusitis, unspecified: Secondary | ICD-10-CM | POA: Diagnosis not present

## 2023-10-05 MED ORDER — DOXYCYCLINE HYCLATE 100 MG PO TABS: 100.0000 mg | ORAL_TABLET | Freq: Two times a day (BID) | ORAL | 0 refills | Status: AC

## 2023-10-05 NOTE — Progress Notes (Signed)
 Acute Office Visit  Subjective:     Patient ID: Jon Jackson, male    DOB: 30-Mar-1951, 73 y.o.   MRN: [id number]   Patient presents today with concerns for chronic sinusitis. He reports 4-5 sinus infections in the last 6 months. Today patient reports 5 days worth of fatigue, headache, and congestion. Associated symptoms include foul taste in mouth, post nasal drip, cough, and intermittent ear pain. He relates last illness approximately 6 weeks ago. Patient does have a smoking history. He denies fevers, shortness of breath, or chest pain today.     Review of Systems  Constitutional:  Positive for malaise/fatigue. Negative for chills and fever.  HENT:  Positive for congestion, ear pain and sinus pain. Negative for ear discharge, hearing loss, sore throat and tinnitus.   Respiratory:  Positive for sputum production. Negative for cough.   Cardiovascular:  Negative for chest pain.  Neurological:  Positive for headaches. Negative for dizziness and loss of consciousness.        Objective:     BP 134/70   Pulse 85   Temp 99.1 F (37.3 C)   Ht 5' 10 (1.778 m)   Wt 225 lb (102.1 kg)   SpO2 96%   BMI 32.28 kg/m   Physical Exam Vitals reviewed.  Constitutional:      General: He is not in acute distress.    Appearance: Normal appearance. He is obese.  HENT:     Head: Normocephalic and atraumatic.     Right Ear: Tympanic membrane normal.     Left Ear: Tympanic membrane normal.     Nose: Mucosal edema, congestion and rhinorrhea present.     Right Turbinates: Enlarged.     Left Turbinates: Enlarged.     Right Sinus: Maxillary sinus tenderness present.     Left Sinus: Maxillary sinus tenderness present.     Mouth/Throat:     Mouth: Mucous membranes are moist.     Pharynx: Oropharynx is clear. Posterior oropharyngeal erythema present.  Eyes:     Extraocular Movements: Extraocular movements intact.     Conjunctiva/sclera: Conjunctivae normal.  Cardiovascular:     Rate and  Rhythm: Normal rate and regular rhythm.     Heart sounds: Normal heart sounds. No murmur heard. Pulmonary:     Effort: Pulmonary effort is normal.     Breath sounds: Normal breath sounds. No stridor. No wheezing, rhonchi or rales.  Skin:    General: Skin is warm and dry.  Neurological:     General: No focal deficit present.     Mental Status: He is alert and oriented to person, place, and time.  Psychiatric:        Mood and Affect: Mood normal.        Behavior: Behavior normal.     No results found for any visits on 10/05/23.      Assessment & Plan:  Recurrent sinus infections Assessment & Plan: Presentation was consistent with sinusitis.  No evidence of other bacterial infections including pneumonia, pharyngitis, otitis media, or orbital cellulitis. Discussed that this fits the picture of viral vs bacterial sinusitis and that due to type and duration of symptoms and exam findings, we will treat as bacterial sinusitis.  Antibiotics prescribed. Referral placed to ENT for recurrent nature of sinusitis as patient has reports 4-5 infections in the last 6 months. Advised to continue ibuprofen  and Tylenol  at home. The patient was instructed to return if the worsens in any way, especially if not tolerating  fluids, increased sinus pain or swelling, worsening headache, persistent fever, difficulty swallowing or breathing, or as needed. The patient agreed with the plan.    Orders: -     Ambulatory referral to ENT -     Doxycycline  Hyclate; Take 1 tablet (100 mg total) by mouth 2 (two) times daily for 7 days.  Dispense: 14 tablet; Refill: 0    Return if symptoms worsen or fail to improve.  Charmaine Weston Fulco, PA-C

## 2023-10-05 NOTE — Assessment & Plan Note (Signed)
 Presentation was consistent with sinusitis.  No evidence of other bacterial infections including pneumonia, pharyngitis, otitis media, or orbital cellulitis. Discussed that this fits the picture of viral vs bacterial sinusitis and that due to type and duration of symptoms and exam findings, we will treat as bacterial sinusitis.  Antibiotics prescribed. Referral placed to ENT for recurrent nature of sinusitis as patient has reports 4-5 infections in the last 6 months. Advised to continue ibuprofen  and Tylenol  at home. The patient was instructed to return if the worsens in any way, especially if not tolerating fluids, increased sinus pain or swelling, worsening headache, persistent fever, difficulty swallowing or breathing, or as needed. The patient agreed with the plan.

## 2023-10-22 ENCOUNTER — Telehealth: Payer: Self-pay | Admitting: Family Medicine

## 2023-10-22 NOTE — Telephone Encounter (Unsigned)
 Copied from CRM [id number]. Topic: Referral - Status >> Oct 22, 2023  3:31 PM Donee H wrote: Reason for CRM: Patient calling to check status of referral. He states seen Charmaine Grooms on July 7 and was told there will be a referral placed for ENT. Please follow back up with patient at [phone]

## 2023-10-23 NOTE — Telephone Encounter (Signed)
 Referral sent to: [facility] [address] - [place] [address] [phone]  [facility] Message sent to Patient with Specialty Office contact information.

## 2023-11-13 ENCOUNTER — Other Ambulatory Visit: Payer: Self-pay | Admitting: Cardiovascular Disease

## 2023-11-18 ENCOUNTER — Ambulatory Visit: Payer: Medicare Other | Admitting: Family Medicine

## 2023-11-18 ENCOUNTER — Encounter: Payer: Self-pay | Admitting: Family Medicine

## 2023-11-18 VITALS — BP 134/74 | HR 84 | Temp 97.9°F | Wt 221.0 lb

## 2023-11-18 DIAGNOSIS — Z720 Tobacco use: Secondary | ICD-10-CM

## 2023-11-18 DIAGNOSIS — Z862 Personal history of diseases of the blood and blood-forming organs and certain disorders involving the immune mechanism: Secondary | ICD-10-CM

## 2023-11-18 DIAGNOSIS — I10 Essential (primary) hypertension: Secondary | ICD-10-CM

## 2023-11-18 DIAGNOSIS — E785 Hyperlipidemia, unspecified: Secondary | ICD-10-CM

## 2023-11-18 DIAGNOSIS — I4891 Unspecified atrial fibrillation: Secondary | ICD-10-CM

## 2023-11-18 MED ORDER — REPATHA SURECLICK 140 MG/ML ~~LOC~~ SOAJ: 140.0000 mg | SUBCUTANEOUS | 3 refills | Status: AC

## 2023-11-18 NOTE — Patient Instructions (Signed)
 Labs ordered   Follow up in 6 months

## 2023-11-18 NOTE — Progress Notes (Signed)
 Subjective:  Patient ID: Jon Jackson, male    DOB: Sep 29, 1950  Age: 73 y.o. MRN: [id number]  CC:   Chief Complaint  Patient presents with   6 month follow up - HTN     No concerns voiced     HPI:  73 year old male presents for follow up.  Patient reports he is doing well. No chest pain or SOB.  BP is well controlled on Norvasc  and Losartan .   Last LDL was 71. On Repatha . Needs labs today.  No recurrences of Afib.  Still smoking some. He is working on this.   Patient Active Problem List   Diagnosis Date Noted   Recurrent sinus infections 10/05/2023   Tobacco abuse 08/27/2023   S/P CABG (coronary artery bypass graft) 04/13/2023   S/P aortic valve replacement with bioprosthetic valve 10/10/2022   Hyperlipidemia 07/10/2022   S/P lumbar fusion 11/19/2020   Osteopenia 11/07/2020   Essential hypertension 11/18/2017   Atrial fibrillation (HCC)    Depression 04/08/2013   Allergic rhinitis 12/12/2012   GERD (gastroesophageal reflux disease) 06/21/2012    Social Hx   Social History   Socioeconomic History   Marital status: Married    Spouse name: Not on file   Number of children: Not on file   Years of education: Not on file   Highest education level: 11th grade  Occupational History   Not on file  Tobacco Use   Smoking status: Some Days    Current packs/day: 0.25    Average packs/day: 0.3 packs/day for 47.0 years (11.8 ttl pk-yrs)    Types: Cigars, Cigarettes   Smokeless tobacco: Never   Tobacco comments:    Cigars- chews on tips  Vaping Use   Vaping status: Never Used  Substance and Sexual Activity   Alcohol use: Yes    Alcohol/week: 12.0 standard drinks of alcohol    Types: 12 Cans of beer per week    Comment: daily 3-4 in the summer; soemtimes more on weekends.   Drug use: No   Sexual activity: Yes  Other Topics Concern   Not on file  Social History Narrative   Not on file   Social Drivers of Health   Financial Resource Strain: Low Risk   (08/25/2023)   Overall Financial Resource Strain (CARDIA)    Difficulty of Paying Living Expenses: Not very hard  Food Insecurity: No Food Insecurity (08/25/2023)   Hunger Vital Sign    Worried About Running Out of Food in the Last Year: Never true    Ran Out of Food in the Last Year: Never true  Transportation Needs: No Transportation Needs (08/25/2023)   PRAPARE - Administrator, Civil Service (Medical): No    Lack of Transportation (Non-Medical): No  Physical Activity: Sufficiently Active (08/25/2023)   Exercise Vital Sign    Days of Exercise per Week: 5 days    Minutes of Exercise per Session: 90 min  Stress: No Stress Concern Present (08/25/2023)   [employer] of Occupational Health - Occupational Stress Questionnaire    Feeling of Stress : Only a little  Social Connections: Moderately Isolated (08/25/2023)   Social Connection and Isolation Panel    Frequency of Communication with Friends and Family: Three times a week    Frequency of Social Gatherings with Friends and Family: Once a week    Attends Religious Services: Never    Database administrator or Organizations: No    Attends Banker Meetings:  Never    Marital Status: Married    Review of Systems Per HPI  Objective:  BP 134/74   Pulse 84   Temp 97.9 F (36.6 C)   Wt 221 lb (100.2 kg)   SpO2 95%   BMI 31.71 kg/m      11/18/2023    1:16 PM 11/18/2023    1:10 PM 10/05/2023    3:51 PM  BP/Weight  Systolic BP 134 160 134  Diastolic BP 74 80 70  Wt. (Lbs)  221 225  BMI  31.71 kg/m2 32.28 kg/m2    Physical Exam Vitals and nursing note reviewed.  Constitutional:      General: He is not in acute distress.    Appearance: Normal appearance.  HENT:     Head: Normocephalic and atraumatic.  Eyes:     General:        Right eye: No discharge.        Left eye: No discharge.     Conjunctiva/sclera: Conjunctivae normal.  Cardiovascular:     Rate and Rhythm: Normal rate and regular rhythm.   Pulmonary:     Effort: Pulmonary effort is normal.     Breath sounds: Normal breath sounds. No wheezing, rhonchi or rales.  Neurological:     Mental Status: He is alert.  Psychiatric:        Mood and Affect: Mood normal.        Behavior: Behavior normal.     Lab Results  Component Value Date   WBC 7.9 11/18/2022   HGB 13.5 11/18/2022   HCT 42.3 11/18/2022   PLT 287 11/18/2022   GLUCOSE 111 (H) 10/13/2022   CHOL 135 02/02/2023   TRIG 77 02/02/2023   HDL 49 02/02/2023   LDLCALC 71 02/02/2023   ALT 26 02/02/2023   AST 21 02/02/2023   NA 136 10/13/2022   K 4.1 10/13/2022   CL 102 10/13/2022   CREATININE 1.07 10/13/2022   BUN 18 10/13/2022   CO2 26 10/13/2022   TSH 1.520 02/24/2017   PSA 0.55 04/07/2014   INR 1.4 (H) 10/10/2022   HGBA1C 5.2 10/09/2022     Assessment & Plan:  Essential hypertension Assessment & Plan: Stable. Continue Norvasc  and Losartan . Labs today.  Orders: -     CMP14+EGFR -     Microalbumin / creatinine urine ratio  Hyperlipidemia, unspecified hyperlipidemia type Assessment & Plan: Stable. Continue Repatha .  Orders: -     Lipid panel -     Repatha  SureClick; Inject 140 mg into the skin every 14 (fourteen) days.  Dispense: 6 mL; Refill: 3  History of anemia -     CBC  Atrial fibrillation, unspecified type [facility]) Assessment & Plan: No recurrence.   Tobacco abuse Assessment & Plan: Advised complete cessation.     Follow-up:  6 months  Anaid Haney Bluford DO [facility]

## 2023-11-18 NOTE — Assessment & Plan Note (Signed)
Stable.  Continue Repatha.

## 2023-11-18 NOTE — Assessment & Plan Note (Signed)
 No recurrence.

## 2023-11-18 NOTE — Assessment & Plan Note (Addendum)
 Stable. Continue Norvasc  and Losartan . Labs today.

## 2023-11-18 NOTE — Assessment & Plan Note (Signed)
Advised complete cessation.  

## 2023-11-19 DIAGNOSIS — E785 Hyperlipidemia, unspecified: Secondary | ICD-10-CM | POA: Diagnosis not present

## 2023-11-19 DIAGNOSIS — I10 Essential (primary) hypertension: Secondary | ICD-10-CM | POA: Diagnosis not present

## 2023-11-19 DIAGNOSIS — Z862 Personal history of diseases of the blood and blood-forming organs and certain disorders involving the immune mechanism: Secondary | ICD-10-CM | POA: Diagnosis not present

## 2023-11-21 LAB — LIPID PANEL
Chol/HDL Ratio: 3.9 ratio (ref 0.0–5.0)
Cholesterol, Total: 176 mg/dL (ref 100–199)
HDL: 45 mg/dL (ref >39)
LDL Chol Calc (NIH): 115 mg/dL — ABNORMAL HIGH (ref 0–99)
Triglycerides: 87 mg/dL (ref 0–149)
VLDL Cholesterol Cal: 16 mg/dL (ref 5–40)

## 2023-11-21 LAB — CMP14+EGFR
ALT: 33 IU/L (ref 0–44)
AST: 28 IU/L (ref 0–40)
Albumin: 4.7 g/dL (ref 3.8–4.8)
Alkaline Phosphatase: 112 IU/L (ref 44–121)
BUN/Creatinine Ratio: 17 (ref 10–24)
BUN: 16 mg/dL (ref 8–27)
Bilirubin Total: 0.4 mg/dL (ref 0.0–1.2)
CO2: 22 mmol/L (ref 20–29)
Calcium: 9.8 mg/dL (ref 8.6–10.2)
Chloride: 102 mmol/L (ref 96–106)
Creatinine, Ser: 0.92 mg/dL (ref 0.76–1.27)
Globulin, Total: 2.3 g/dL (ref 1.5–4.5)
Glucose: 92 mg/dL (ref 70–99)
Potassium: 4.9 mmol/L (ref 3.5–5.2)
Sodium: 140 mmol/L (ref 134–144)
Total Protein: 7 g/dL (ref 6.0–8.5)
eGFR: 88 mL/min/1.73 (ref >59)

## 2023-11-21 LAB — CBC
Hematocrit: 46.7 % (ref 37.5–51.0)
Hemoglobin: 15.2 g/dL (ref 13.0–17.7)
MCH: 31.1 pg (ref 26.6–33.0)
MCHC: 32.5 g/dL (ref 31.5–35.7)
MCV: 96 fL (ref 79–97)
Platelets: 227 x10E3/uL (ref 150–450)
RBC: 4.88 x10E6/uL (ref 4.14–5.80)
RDW: 12.1 % (ref 11.6–15.4)
WBC: 6.8 x10E3/uL (ref 3.4–10.8)

## 2023-11-21 LAB — MICROALBUMIN / CREATININE URINE RATIO
Creatinine, Urine: 102.3 mg/dL
Microalb/Creat Ratio: <3 mg/g{creat} (ref 0–29)
Microalbumin, Urine: <3 ug/mL

## 2023-11-23 ENCOUNTER — Ambulatory Visit: Payer: Self-pay | Admitting: Family Medicine

## 2023-12-02 DIAGNOSIS — D485 Neoplasm of uncertain behavior of skin: Secondary | ICD-10-CM | POA: Diagnosis not present

## 2023-12-02 DIAGNOSIS — L570 Actinic keratosis: Secondary | ICD-10-CM | POA: Diagnosis not present

## 2023-12-03 ENCOUNTER — Encounter: Payer: Self-pay | Admitting: Cardiology

## 2023-12-14 ENCOUNTER — Ambulatory Visit (INDEPENDENT_AMBULATORY_CARE_PROVIDER_SITE_OTHER): Admitting: Otolaryngology

## 2023-12-14 ENCOUNTER — Encounter (INDEPENDENT_AMBULATORY_CARE_PROVIDER_SITE_OTHER): Payer: Self-pay | Admitting: Otolaryngology

## 2023-12-14 VITALS — BP 143/80 | HR 90

## 2023-12-14 DIAGNOSIS — J343 Hypertrophy of nasal turbinates: Secondary | ICD-10-CM | POA: Diagnosis not present

## 2023-12-14 DIAGNOSIS — J310 Chronic rhinitis: Secondary | ICD-10-CM | POA: Diagnosis not present

## 2023-12-14 DIAGNOSIS — J324 Chronic pansinusitis: Secondary | ICD-10-CM

## 2023-12-14 DIAGNOSIS — R0981 Nasal congestion: Secondary | ICD-10-CM | POA: Diagnosis not present

## 2023-12-14 DIAGNOSIS — F17210 Nicotine dependence, cigarettes, uncomplicated: Secondary | ICD-10-CM | POA: Diagnosis not present

## 2023-12-14 DIAGNOSIS — J342 Deviated nasal septum: Secondary | ICD-10-CM | POA: Diagnosis not present

## 2023-12-16 DIAGNOSIS — J342 Deviated nasal septum: Secondary | ICD-10-CM | POA: Insufficient documentation

## 2023-12-16 DIAGNOSIS — J324 Chronic pansinusitis: Secondary | ICD-10-CM | POA: Insufficient documentation

## 2023-12-16 DIAGNOSIS — J343 Hypertrophy of nasal turbinates: Secondary | ICD-10-CM | POA: Insufficient documentation

## 2023-12-16 NOTE — Progress Notes (Signed)
 CC: Recurrent sinusitis, chronic nasal congestion  HPI:  Jon Jackson is a 73 y.o. male who presents today complaining of recurrent sinusitis for 4 to 5 years.  The frequency of his sinusitis has increased lately.  He has had 6 episodes of sinusitis since the beginning of this year.  His symptoms include facial pain and pressure, referred ear pain, nasal drainage, and nasal congestion.  He has a history of environmental allergies.  He has been on Xyzal  and Flonase  for several years.  He previously underwent septoplasty surgery and adenotonsillectomy surgery.  Currently he denies any fever or visual change.  Past Medical History:  Diagnosis Date   Anemia    as a child   Anxiety    Arthritis    Atrial fibrillation (HCC)    Basal cell carcinoma    head   Coronary artery disease    Depression    Dysrhythmia    GERD (gastroesophageal reflux disease)    Heart murmur    Hyperlipidemia    Myocardial infarction (HCC) 10/02/2022   Pneumonia 02/2022   Squamous carcinoma     Past Surgical History:  Procedure Laterality Date   AORTIC VALVE REPLACEMENT N/A 10/10/2022   Procedure: AORTIC VALVE REPLACEMENT USING 25 MM INSPIRIS AORTIC VALVE;  Surgeon: Lucas Dorise POUR, MD;  Location: [facility];  Service: Open Heart Surgery;  Laterality: N/A;   ATRIAL FIBRILLATION ABLATION  07/22/2016   ATRIAL FIBRILLATION ABLATION N/A 07/22/2016   Procedure: Atrial Fibrillation Ablation;  Surgeon: Will Gladis Norton, MD;  [facility]: [facility];  Service: Cardiovascular;  Laterality: N/A;   BASAL CELL CARCINOMA EXCISION     forehead; top of scalp   BIOPSY  05/27/2017   Procedure: BIOPSY;  Surgeon: Shaaron Lamar HERO, MD;  [facility]: [facility];  Service: Endoscopy;;  right colon, left colon;   CARDIOVERSION N/A 04/07/2016   Procedure: CARDIOVERSION;  Surgeon: Jayson KANDICE Sierras, MD;  Location: [facility];  Service: Cardiovascular;  Laterality: N/A;   COLECTOMY  1990s   removal of section due to strangulated bowel.    COLONOSCOPY N/A 05/30/2013   Procedure: COLONOSCOPY;  Surgeon: Lamar HERO Shaaron, MD;  [facility]: [facility];  Service: Endoscopy;  Laterality: N/A;  8:45 AM   COLONOSCOPY     COLONOSCOPY N/A 05/27/2017   Procedure: COLONOSCOPY;  Surgeon: Shaaron Lamar HERO, MD;  Location: [facility];  Service: Endoscopy;  Laterality: N/A;  1:00pm   CORONARY ARTERY BYPASS GRAFT N/A 10/10/2022   Procedure: CORONARY ARTERY BYPASS GRAFTING (CABG) X 1 USING ENDOSCOPICALY HARVESTED RIGHT GREATER SAPHENOUS VEIN;  Surgeon: Lucas Dorise POUR, MD;  Location: [facility];  Service: Open Heart Surgery;  Laterality: N/A;   INGUINAL HERNIA REPAIR Right 1990s   LUMBAR LAMINECTOMY/DECOMPRESSION MICRODISCECTOMY N/A 12/29/2018   Procedure: Decompressive lumbar laminectomy at L3-L4;  Surgeon: Heide Ingle, MD;  Location: [facility];  Service: Orthopedics;  Laterality: N/A;    RIGHT HEART CATH AND CORONARY ANGIOGRAPHY N/A 10/03/2022   Procedure: RIGHT HEART CATH AND CORONARY ANGIOGRAPHY;  Surgeon: Claudene Pacific, MD;  [facility];  Service: Cardiovascular;  Laterality: N/A;   RIGHT/LEFT HEART CATH AND CORONARY ANGIOGRAPHY N/A 10/03/2022   Procedure: RIGHT/LEFT HEART CATH AND CORONARY ANGIOGRAPHY;  Surgeon: Claudene Pacific, MD;  [facility];  Service: Cardiovascular;  Laterality: N/A;   SHOULDER ARTHROSCOPY WITH ROTATOR CUFF REPAIR AND SUBACROMIAL DECOMPRESSION Left 05/15/2016   Procedure: LEFT SHOULDER ARTHROSCOPY WITH ROTATOR CUFF REPAIR AND SUBACROMIAL DECOMPRESSION AND DISTAL  CLAVICLE RESECTION;  Surgeon: Franky Pointer, MD;  Location: [facility] OR;  Service: Orthopedics;  Laterality: Left;  requests 2hrs   SHOULDER SURGERY Right    SQUAMOUS CELL CARCINOMA EXCISION     scalp   TEE WITHOUT CARDIOVERSION N/A 10/10/2022   Procedure: TRANSESOPHAGEAL ECHOCARDIOGRAM;  Surgeon: Lucas Dorise POUR, MD;  Location: [facility];  Service: Open Heart Surgery;  Laterality: N/A;   TONSILLECTOMY      Family History  Problem Relation Age  of Onset   Colon cancer Brother 40   Throat cancer Brother    Breast cancer Mother     Social History:  reports that he has been smoking cigars and cigarettes. He has a 11.8 pack-year smoking history. He has never used smokeless tobacco. He reports current alcohol use of about 12.0 standard drinks of alcohol per week. He reports that he does not use drugs.  Allergies:  Allergies  Allergen Reactions   Ultram  [Tramadol ] Other (See Comments)    Head felt funny    Prior to Admission medications   Medication Sig Start Date End Date Taking? Authorizing Provider  amLODipine  (NORVASC ) 5 MG tablet Take 1 tablet (5 mg total) by mouth daily. 04/13/23  Yes Cook, Jayce G, DO  aspirin  EC 325 MG tablet Take 1 tablet (325 mg total) by mouth daily. 10/14/22  Yes Gold, Wayne E, PA-C  citalopram  (CELEXA ) 40 MG tablet Take 1 tablet (40 mg total) by mouth daily. 05/21/23  Yes Cook, Jayce G, DO  Evolocumab  (REPATHA  SURECLICK) 140 MG/ML SOAJ Inject 140 mg into the skin every 14 (fourteen) days. 11/18/23  Yes Cook, Jayce G, DO  fluticasone  (FLONASE ) 50 MCG/ACT nasal spray Place 2 sprays into both nostrils daily. 04/03/23  Yes Leath-Warren, Etta PARAS, NP  losartan  (COZAAR ) 100 MG tablet Take 1 tablet (100 mg total) by mouth daily. 08/26/23 12/14/23 Yes Cook, Jayce G, DO  Multiple Vitamin (MULTIVITAMIN) tablet Take 1 tablet by mouth daily.   Yes [provider]  omeprazole  (PRILOSEC ) 40 MG capsule Take 1 capsule (40 mg total) by mouth daily as needed. 05/21/23  Yes Cook, Jayce G, DO    Blood pressure (!) 143/80, pulse 90, SpO2 95%. Exam: General: Communicates without difficulty, well nourished, no acute distress. Head: Normocephalic, no evidence injury, no tenderness, facial buttresses intact without stepoff. Face/sinus: No tenderness to palpation and percussion. Facial movement is normal and symmetric. Eyes: PERRL, EOMI. No scleral icterus, conjunctivae clear. Neuro: CN II exam reveals vision grossly intact.   No nystagmus at any point of gaze. Ears: Auricles well formed without lesions.  Ear canals are intact without mass or lesion.  No erythema or edema is appreciated.  The TMs are intact without fluid. Nose: External evaluation reveals normal support and skin without lesions.  Dorsum is intact.  Anterior rhinoscopy reveals congested mucosa over anterior aspect of inferior turbinates and deviated septum.  No purulence noted. Oral:  Oral cavity and oropharynx are intact, symmetric, without erythema or edema.  Mucosa is moist without lesions. Neck: Full range of motion without pain.  There is no significant lymphadenopathy.  No masses palpable.  Thyroid  bed within normal limits to palpation.  Parotid glands and submandibular glands equal bilaterally without mass.  Trachea is midline. Neuro:  CN 2-12 grossly intact.   Procedure:  Flexible Nasal Endoscopy: Description: Risks, benefits, and alternatives of flexible endoscopy were explained to the patient.  Specific mention was made of the risk of throat numbness with difficulty swallowing, possible bleeding from the nose  and mouth, and pain from the procedure.  The patient gave oral consent to proceed.  The flexible scope was inserted into the right nasal cavity.  Endoscopy of the interior nasal cavity, superior, inferior, and middle meatus was performed. The sphenoid-ethmoid recess was examined. Edematous mucosa was noted.  No polyp, mass, or lesion was appreciated. Nasal septal deviation noted. Olfactory cleft was clear.  Nasopharynx was clear.  Turbinates were hypertrophied but without mass.  The procedure was repeated on the contralateral side with similar findings.  The patient tolerated the procedure well.   Assessment: 1.  Recurrent rhinosinusitis, with nasal mucosal congestion, nasal septal deviation, and bilateral inferior turbinate hypertrophy. 2.  No purulent drainage or acute infection is noted today.  Plan: 1.  The physical exam and nasal endoscopy  findings are reviewed with the patient. 2.  Flonase  nasal spray, Xyzal , and nasal saline irrigation daily. 3.  Sinus CT scan to evaluate for chronic rhinosinusitis. 4.  The patient will return for reevaluation in 4 weeks.  [place] Ducre W Sharone Picchi 12/16/2023, 8:23 AM

## 2024-01-05 ENCOUNTER — Other Ambulatory Visit: Payer: Self-pay | Admitting: Cardiovascular Disease

## 2024-01-06 ENCOUNTER — Ambulatory Visit (HOSPITAL_COMMUNITY)
Admission: RE | Admit: 2024-01-06 | Discharge: 2024-01-06 | Disposition: A | Discharge status: Home / Self Care | Source: Ambulatory Visit | Attending: Otolaryngology | Admitting: Otolaryngology

## 2024-01-06 DIAGNOSIS — J343 Hypertrophy of nasal turbinates: Secondary | ICD-10-CM | POA: Diagnosis not present

## 2024-01-06 DIAGNOSIS — J324 Chronic pansinusitis: Secondary | ICD-10-CM | POA: Insufficient documentation

## 2024-01-06 DIAGNOSIS — J342 Deviated nasal septum: Secondary | ICD-10-CM | POA: Diagnosis not present

## 2024-01-12 ENCOUNTER — Encounter (INDEPENDENT_AMBULATORY_CARE_PROVIDER_SITE_OTHER): Payer: Self-pay | Admitting: Otolaryngology

## 2024-01-12 ENCOUNTER — Ambulatory Visit (INDEPENDENT_AMBULATORY_CARE_PROVIDER_SITE_OTHER): Admitting: Otolaryngology

## 2024-01-12 VITALS — BP 149/78 | HR 76 | Temp 98.0°F | Ht 69.0 in | Wt 224.0 lb

## 2024-01-12 DIAGNOSIS — J343 Hypertrophy of nasal turbinates: Secondary | ICD-10-CM | POA: Diagnosis not present

## 2024-01-12 DIAGNOSIS — J3489 Other specified disorders of nose and nasal sinuses: Secondary | ICD-10-CM | POA: Diagnosis not present

## 2024-01-12 DIAGNOSIS — J329 Chronic sinusitis, unspecified: Secondary | ICD-10-CM

## 2024-01-12 DIAGNOSIS — J310 Chronic rhinitis: Secondary | ICD-10-CM | POA: Insufficient documentation

## 2024-01-12 NOTE — Progress Notes (Signed)
 Patient ID: Jon Jackson, male   DOB: July 01, 1950, 73 y.o.   MRN: [id number]  Follow-up: Chronic nasal obstruction, recurrent sinusitis  Discussed the use of AI scribe software for clinical note transcription with the patient, who gave verbal consent to proceed.  History of Present Illness Jon Jackson is a 73 year old male who presents with recurrent sinusitis and chronic nasal obstruction.  He returns for follow-up regarding recurrent sinusitis and chronic nasal obstruction, last seen one month ago with these complaints. Despite using Flonase , Xyzal , and saline irrigation, he continues to experience significant nasal congestion and pressure, particularly during perceived infections.  The worst symptoms are congestion and pressure, accompanied by headaches. He continues to use Xyzal  and Flonase , especially during allergy seasons like spring and fall, using Flonase  daily during these times.   His recent CT scan shows no significant acute or chronic sinusitis.  However, his nasal passageways are severely obstructed by bilateral inferior turbinate hypertrophy.  His past medical history includes an aortic valve replacement about a year ago. No irregular heartbeat, heart attack, or use of blood thinners. He has had two back surgeries in the past, performed by a neurosurgeon, but has not seen a neurologist for his headaches.  Exam: General: Communicates without difficulty, well nourished, no acute distress. Head: Normocephalic, no evidence injury, no tenderness, facial buttresses intact without stepoff. Face/sinus: No tenderness to palpation and percussion. Facial movement is normal and symmetric. Eyes: PERRL, EOMI. No scleral icterus, conjunctivae clear. Neuro: CN II exam reveals vision grossly intact.  No nystagmus at any point of gaze. Ears: Auricles well formed without lesions.  Ear canals are intact without mass or lesion.  No erythema or edema is appreciated.  The TMs are intact without  fluid. Nose: External evaluation reveals normal support and skin without lesions.  Dorsum is intact.  Anterior rhinoscopy reveals congested mucosa over anterior aspect of inferior turbinates and intact septum.  No purulence noted. Oral:  Oral cavity and oropharynx are intact, symmetric, without erythema or edema.  Mucosa is moist without lesions. Neck: Full range of motion without pain.  There is no significant lymphadenopathy.  No masses palpable.  Thyroid  bed within normal limits to palpation.  Parotid glands and submandibular glands equal bilaterally without mass.  Trachea is midline. Neuro:  CN 2-12 grossly intact.   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  More than 95% of his nasal passageways are obstructed bilaterally.  CT scan reveals no significant sinusitis, but nasal passageways are obstructed by enlarged turbinates. Current treatment with Flonase  and Xyzal  is ongoing. Nasal obstruction is not related to sinus infection, and headaches may be due to a neurologic cause such as tension or migraine headaches. - Continue Flonase  daily during allergy season. - Consider turbinate reduction surgery to alleviate nasal obstruction. - The risk, benefits, alternatives, and details of the inferior turbinate reduction surgery are extensively discussed with the patient.  Questions were invited and answered. - The patient would like to proceed with the procedure.  We will schedule the procedure in accordance with the patient's schedule. - Refer to neurologist for evaluation and management of headaches.

## 2024-01-25 DIAGNOSIS — L570 Actinic keratosis: Secondary | ICD-10-CM | POA: Diagnosis not present

## 2024-01-28 ENCOUNTER — Encounter (HOSPITAL_COMMUNITY): Payer: Self-pay | Admitting: Physician Assistant

## 2024-01-28 NOTE — Progress Notes (Signed)
 Anesthesia Chart Review: Same day workup  73 year old male follows with cardiology for history of atrial fibrillation s/p ablation 2018, HLD, HTN, CAD s/p NSTEMI 09/2022 with subsequent one-vessel CABG with concomitant bioprosthetic AVR.  Last seen by Dr. Verlin 02/02/2023 and noted to be doing well at that time from cardiac standpoint, no chest pain.  No changes to management.  1 year follow-up recommended.  Other pertinent history includes current some-day smoker, GERD on PPI, anxiety.  Last seen in follow-up by PCP Dr. Jacqulyn Ahle on 11/18/2023.  Noted to be doing well at that time, no changes to management, 57-month follow-up recommended.  Reached out to surgeon's office to advise patient should have preoperative cardiology input prior to elective surgery given that he is due for follow-up.  Ability to proceed pending cardiology clearance.  EKG 10/11/2022: Normal sinus rhythm.  Rate 97. T wave abnormality, consider lateral ischemia  TEE 10/10/2022: POST-OP IMPRESSIONS  _ Left Ventricle: has normal systolic function. The cavity size was  normal. The  wall motion is normal.  _ Right Ventricle: The right ventricle appears unchanged from pre-bypass.  _ Aorta: The aorta appears unchanged from pre-bypass.  _ Left Atrial Appendage: The left atrial appendage appears unchanged from  pre-bypass.  _ Aortic Valve: A pericardial bioprosthetic valve was placed, leaflets are  freely mobile and leaflets thin Manufactured by; [employer] Size; 25mm. No  regurgitation post repair. The gradient recorded across the prosthetic  valve is  within the expected range. No perivalvular leak noted.  _ Mitral Valve: There is trivial regurgitation.  _ Tricuspid Valve: There is trivial regurgitation.  _ Comments: Post-bypass images reviewed with surgeon.   Cath 10/03/2022:   Prox LAD to Mid LAD lesion is 30% stenosed.   Prox RCA lesion is 70% stenosed.   LV end diastolic pressure is normal.   Hemodynamic findings  consistent with aortic valve stenosis.   There is severe aortic valve stenosis. There is mild (2+) aortic regurgitation.   Recommend dual antiplatelet therapy with Aspirin  81mg  daily and Clopidogrel 75mg  daily.   CVTS/TVAR consult + RCA angioplasty to follow AV replacement.   Lynwood Geofm RIGGERS [facility]/Anesthesiology Phone [phone] 01/28/2024 10:29 AM

## 2024-01-29 ENCOUNTER — Telehealth (INDEPENDENT_AMBULATORY_CARE_PROVIDER_SITE_OTHER): Payer: Self-pay | Admitting: Otolaryngology

## 2024-01-29 ENCOUNTER — Ambulatory Visit (HOSPITAL_COMMUNITY): Admission: RE | Admit: 2024-01-29 | Source: Home / Self Care | Admitting: Otolaryngology

## 2024-01-29 ENCOUNTER — Encounter (HOSPITAL_COMMUNITY): Admission: RE | Payer: Self-pay | Source: Home / Self Care

## 2024-01-29 SURGERY — REDUCTION, NASAL TURBINATE
Anesthesia: General | Laterality: Bilateral

## 2024-01-29 NOTE — Telephone Encounter (Signed)
 Primary Cardiologist:Christopher Verlin, MD  Chart reviewed as part of pre-operative protocol coverage. Because of Dvante Hands Vahle's past medical history and time since last visit, he/she will require a follow-up visit in order to better assess preoperative cardiovascular risk.  Pre-op  covering staff: - Patient has an appointment with Lum Louis, NP on 02/03/24 at which time clearance will be addressed. Appointment notes have been updated.  - Please contact requesting surgeon's office via preferred method (i.e, phone, fax) to inform them of need for appointment prior to surgery.  Per office protocol and pending no concerning cardiac symptoms, he may hold aspirin  for 5-7 days prior to procedure and should resume as soon as hemodynamically stable postoperatively. **Of note, patient's medication list states aspirin  325 mg daily, consider reducing dose to 81 mg daily.   Rosaline EMERSON Bane, NP-C  01/29/2024, 3:29 PM [address] [place], [place] [address] Office [phone] Fax [phone]

## 2024-01-29 NOTE — Telephone Encounter (Signed)
 Surgical clearance was faxed to Dr. Verlin ( [facility]) fax# [phone]. Phone # [phone].

## 2024-01-29 NOTE — Telephone Encounter (Signed)
   Pre-operative Risk Assessment    Patient Name: Jon Jackson  DOB: 27-Sep-1950 MRN: [id number]   Date of last office visit: 02/02/23 DR. MCALHANY Date of next office visit: 02/03/24 MADISON FOUNTAIN, NP   Request for Surgical Clearance    Procedure:  B/L TURBINATE REDUCTION  Date of Surgery:  Clearance TBD                              Surgeon:  DR. KARIS Surgeon's Group or Practice Name:  [facility] Phone number:  [phone] Fax number:  [phone]   Type of Clearance Requested:   - Medical  - Pharmacy:  Hold Aspirin  325 MG   Type of Anesthesia:  General    Additional requests/questions:    Bonney Niels Jest   01/29/2024, 2:21 PM

## 2024-02-01 ENCOUNTER — Encounter: Payer: Self-pay | Admitting: *Deleted

## 2024-02-01 ENCOUNTER — Encounter (INDEPENDENT_AMBULATORY_CARE_PROVIDER_SITE_OTHER): Admitting: Otolaryngology

## 2024-02-03 ENCOUNTER — Ambulatory Visit: Attending: Emergency Medicine | Admitting: Emergency Medicine

## 2024-02-03 ENCOUNTER — Encounter: Payer: Self-pay | Admitting: Emergency Medicine

## 2024-02-03 VITALS — BP 142/76 | HR 79 | Ht 69.0 in | Wt 226.0 lb

## 2024-02-03 DIAGNOSIS — Z01810 Encounter for preprocedural cardiovascular examination: Secondary | ICD-10-CM | POA: Diagnosis not present

## 2024-02-03 DIAGNOSIS — E782 Mixed hyperlipidemia: Secondary | ICD-10-CM | POA: Insufficient documentation

## 2024-02-03 DIAGNOSIS — I2510 Atherosclerotic heart disease of native coronary artery without angina pectoris: Secondary | ICD-10-CM | POA: Diagnosis not present

## 2024-02-03 DIAGNOSIS — I10 Essential (primary) hypertension: Secondary | ICD-10-CM | POA: Diagnosis not present

## 2024-02-03 DIAGNOSIS — Z953 Presence of xenogenic heart valve: Secondary | ICD-10-CM | POA: Diagnosis not present

## 2024-02-03 DIAGNOSIS — I480 Paroxysmal atrial fibrillation: Secondary | ICD-10-CM | POA: Insufficient documentation

## 2024-02-03 MED ORDER — OLMESARTAN MEDOXOMIL 40 MG PO TABS: 40.0000 mg | ORAL_TABLET | Freq: Every day | ORAL | 3 refills | Status: AC

## 2024-02-03 MED ORDER — ASPIRIN 81 MG PO CAPS: 81.0000 mg | ORAL_CAPSULE | Freq: Every day | ORAL | 3 refills | Status: DC

## 2024-02-03 NOTE — Patient Instructions (Signed)
 Medication Instructions:  STOP TAKING ASPIRIN  325 MG. START TAKING ASPIRIN  81 MG DAILY. STOP TAKING LOSARTAN .  START TAKING OLMESARTAN 40 MG DAILY.  Lab Work: FASTING LIPID PANEL, CBC, AND BMET TO BE DONE NEXT WEEK. PLEASE MAKE SURE THAT YOU ARE FASTING!!   Testing/Procedures: Your physician has requested that you have an echocardiogram. Echocardiography is a painless test that uses sound waves to create images of your heart. It provides your doctor with information about the size and shape of your heart and how well your heart's chambers and valves are working. This procedure takes approximately one hour. There are no restrictions for this procedure. Please do NOT wear cologne, perfume, aftershave, or lotions (deodorant is allowed). Please arrive 15 minutes prior to your appointment time.  Please note: We ask at that you not bring children with you during ultrasound (echo/ vascular) testing. Due to room size and safety concerns, children are not allowed in the ultrasound rooms during exams. Our front office staff cannot provide observation of children in our lobby area while testing is being conducted. An adult accompanying a patient to their appointment will only be allowed in the ultrasound room at the discretion of the ultrasound technician under special circumstances. We apologize for any inconvenience.   Follow-Up: At [facility], you and your health needs are our priority.  As part of our continuing mission to provide you with exceptional heart care, our providers are all part of one team.  This team includes your primary Cardiologist (physician) and Advanced Practice Providers or APPs (Physician Assistants and Nurse Practitioners) who all work together to provide you with the care you need, when you need it.  Your next appointment:   3 MONTHS  Provider:   MADISON FOUNTAIN, NP

## 2024-02-03 NOTE — Progress Notes (Signed)
 Cardiology Office Note:    Date:  02/03/2024  ID:  Jon Jackson, DOB 1950-11-07, MRN [id number] PCP: Bluford Jacqulyn MATSU, DO  [facility] Providers Cardiologist:  Lonni Cash, MD Cardiology APP:  Rana Lum CROME, NP  Electrophysiologist:  Soyla Gladis Norton, MD       Patient Profile:       Chief Complaint: 1 year follow-up History of Present Illness:  Jon Jackson is a 73 y.o. male with visit-pertinent history of coronary artery disease s/p CABG x 1, severe aortic stenosis s/p surgical AVR, atrial fibrillation s/p ablation, hyperlipidemia, hypertension, GERD  He was initially seen in 2017 by Dr. Debera for management of atrial fibrillation.  He underwent DCCV on 03/2016 with conversion to sinus rhythm but converted back to A-fib 5 days later.  He was referred to Dr. Norton underwent successful A-fib ablation on 07/22/2016.  He continued to do well until 10/02/2022 when he presented to the ED with complaint of radiating chest pain with diaphoresis.  High sensitive troponins were elevated and he was found to have severe aortic stenosis.  He underwent R/LHC that showed moderate proximal LAD and mid LAD disease with 30% lesion and significant proximal RCA lesion of 70%.  Echocardiogram is completed showing LVEF 55 to 60% with severe aortic stenosis with grade 2 plaque involving the aortic root and ascending aorta.  Mean gradient at 33 mmHg.  He was evaluated by CT surgery plan was made to undergo AVR and CABG.  He underwent successful CABG x 1 with AVR repair on 10/10/2022.  He developed bradycardia metoprolol  was discontinued at that time.  He was last seen in clinic on 02/02/2023 by Dr. Cash.  He was doing well without chest pains or shortness of breath.  He had no reoccurrence of atrial fibrillation.  Blood pressure was well-controlled.  No medication changes were made.  He was to follow-up in 1 year.  He is now pending B/L turbinate reduction on date TBD with [facility]   Discussed the use of AI scribe software for clinical note transcription with the patient, who gave verbal consent to proceed.  History of Present Illness Jon Jackson is a 73 year old male with aortic valve replacement and coronary artery bypass grafting who presents for a one-year follow-up and pending procedure.  Today he is doing well without acute cardiovascular concerns.  He denies any exertional chest pains or exertional dyspnea.  No lightheadedness, dizziness, syncope or presyncope.  He experiences shortness of breath only when bending over or squatting, with no chest pain or shortness of breath during regular activities such as walking or yard work. He paces himself and takes breaks as needed.  He remains very active as he [place].  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  He has a history of atrial fibrillation and underwent an ablation. He has not experienced any episodes of atrial fibrillation since the surgery and monitors his rhythm independently.  He denies any concerning symptoms for recurrent atrial fibrillation.  No palpitations.  His cholesterol levels were high, with an LDL of 115 in August.  He was inconsistently taking his Repatha .  However has been taking his last 3 doses consistently.  His blood pressure is elevated, with home readings around 140 systolic.  He takes losartan  100 mg in the morning. Previously, his blood pressure was higher, with a reading of 180/88 last year on a lower dose of losartan .  Review of systems:  Please see the history of present illness. All other systems are reviewed and otherwise negative.      Studies Reviewed:    EKG Interpretation Date/Time:  Wednesday February 03 2024 13:38:43 EST Ventricular Rate:  79 PR Interval:  158 QRS Duration:  104 QT Interval:  394 QTC Calculation: 451 R Axis:   54  Text Interpretation: Normal sinus rhythm Normal ECG When compared with ECG of  11-Oct-2022 06:45, ST elevation now present in Inferior leads Nonspecific T wave abnormality no longer evident in Inferior leads Nonspecific T wave abnormality has replaced inverted T waves in Lateral leads Confirmed by Rana Dixon [phone]) on 02/03/2024 4:38:50 PM    TEE 10/10/2022 POST-OP IMPRESSIONS  _ Left Ventricle: has normal systolic function. The cavity size was  normal. The  wall motion is normal.  _ Right Ventricle: The right ventricle appears unchanged from pre-bypass.  _ Aorta: The aorta appears unchanged from pre-bypass.  _ Left Atrial Appendage: The left atrial appendage appears unchanged from  pre-bypass.  _ Aortic Valve: A pericardial bioprosthetic valve was placed, leaflets are  freely mobile and leaflets thin Manufactured by; [employer] Size; 25mm. No  regurgitation post repair. The gradient recorded across the prosthetic  valve is  within the expected range. No perivalvular leak noted.  _ Mitral Valve: There is trivial regurgitation.  _ Tricuspid Valve: There is trivial regurgitation.  _ Comments: Post-bypass images reviewed with surgeon.   Carotid Doppler 10/09/2022 Right Carotid: Velocities in the right ICA are consistent with a 1-39%  stenosis.   Left Carotid: Velocities in the left ICA are consistent with a 1-39%  stenosis.  Vertebrals: Bilateral vertebral arteries demonstrate antegrade flow.  Subclavians: Normal flow hemodynamics were seen in bilateral subclavian               arteries.   Echocardiogram 10/03/2022 1. Left ventricular ejection fraction, by estimation, is 55 to 60%. The  left ventricle has normal function. The left ventricle has no regional  wall motion abnormalities. Left ventricular diastolic parameters are  consistent with Grade I diastolic  dysfunction (impaired relaxation).   2. Right ventricular systolic function is normal. The right ventricular  size is normal.   3. Left atrial size was moderately dilated.   4. Right atrial size was  mildly dilated.   5. The mitral valve is normal in structure. Trivial mitral valve  regurgitation.   6. The aortic valve is calcified. There is severe calcifcation of the  aortic valve. There is moderate thickening of the aortic valve. Aortic  valve regurgitation is mild. Severe aortic valve stenosis.   7. There is mild (Grade II) atheroma plaque involving the aortic root and  ascending aorta.   8. The inferior vena cava is dilated in size with >50% respiratory  variability, suggesting right atrial pressure of 8 mmHg.   Cardiac catheterization 10/03/2022   Prox LAD to Mid LAD lesion is 30% stenosed.   Prox RCA lesion is 70% stenosed.   LV end diastolic pressure is normal.   Hemodynamic findings consistent with aortic valve stenosis.   There is severe aortic valve stenosis. There is mild (2+) aortic regurgitation.   Recommend dual antiplatelet therapy with Aspirin  81mg  daily and Clopidogrel 75mg  daily.   CVTS/TVAR consult + RCA angioplasty to follow AV replacement. Risk Assessment/Calculations:    CHA2DS2-VASc Score =     This indicates a  % annual risk of stroke. The patient's score is based upon:  HYPERTENSION CONTROL Vitals:   02/03/24 1332 02/03/24 1422  BP: (!) 140/70 (!) 142/76    The patient's blood pressure is elevated above target today.  In order to address the patient's elevated BP: A current anti-hypertensive medication was adjusted today.           Physical Exam:   VS:  BP (!) 142/76   Pulse 79   Ht 5' 9 (1.753 m)   Wt 226 lb (102.5 kg)   BMI 33.37 kg/m    Wt Readings from Last 3 Encounters:  02/03/24 226 lb (102.5 kg)  01/12/24 224 lb (101.6 kg)  11/18/23 221 lb (100.2 kg)    GEN: Well nourished, well developed in no acute distress NECK: No JVD; No carotid bruits CARDIAC: RRR, no murmurs, rubs, gallops RESPIRATORY:  Clear to auscultation without rales, wheezing or rhonchi  ABDOMEN: Soft, non-tender, non-distended EXTREMITIES:  No edema; No acute  deformity      Assessment and Plan:  Coronary artery disease S/p CABG x 1 on 09/2022 - Today he is stable without chest pains or dyspnea.  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  He remains asymptomatic.  Will assess LV & WMA with echocardiogram.  No further ischemic evaluation warranted at this time - Has been managed on aspirin  325 mg daily.  Since CABG  >1 year I will transition him to aspirin  81 mg daily - Start aspirin  81 mg daily - Continue Repatha  140 mg q. 14 days  Severe aortic stenosis s/p AVR S/p AVR on 09/2022 TEE (postop) 09/2022 showed no regurgitation postrepair with no perivalvular leak - Today he is stable without chest pains, dyspnea, or syncope.  Remains active without limitation or exertional symptoms - Will repeat echocardiogram for routine monitoring - SBE prophylaxis  Paroxysmal atrial fibrillation S/p ablation in 2018 Eliquis  stopped in 12/2021 as he had not had reoccurrence - EKG today shows he is maintaining normal sinus rhythm - He denies any symptoms concerning for recurrent atrial fibrillation - Management per EP  Carotid artery disease Carotid Dopplers 09/2022 with bilateral ICA 1-39% stenosis - He denies new neurological symptoms - Can consider repeat carotid Dopplers x 1 year for routine monitoring - Continue aspirin  81 mg daily and Repatha  140 mg q. 14 days  Hypertension Blood pressure today is elevated at 142/76 Home blood pressures average 140s - Will discontinue losartan  and replace with olmesartan 40 mg daily - Start olmesartan 40 mg daily - Repeat BMET x 1 week  Hyperlipidemia LDL 115 on 10/2023 and uncontrolled Was inconsistently taking Repatha  at that time and has since been adherent - Repeat fasting lipid panel x 1 week - Continue Repatha  140 mg q. 14 days  Preoperative cardiovascular  exam Pending B/L turbinate reduction and date TBD with [facility] According to the Revised Cardiac Risk Index (RCRI), his Perioperative Risk of Major Cardiac Event is (%): 0.9. His Functional Capacity in METs is: 5.62 according to the Duke Activity Status Index (DASI). Therefore, based on ACC/AHA guidelines, patient would be at acceptable risk for the planned procedure without further cardiovascular testing. I will route this recommendation to the requesting party via Epic fax function.  He may hold aspirin  for 5-7 days prior to procedure. Please resume aspirin  as soon as possible postprocedure, at the discretion of the surgeon.         Dispo:  Return in about 3 months (around 05/05/2024).  Signed, Lum LITTIE Louis, NP

## 2024-02-11 DIAGNOSIS — Z953 Presence of xenogenic heart valve: Secondary | ICD-10-CM | POA: Diagnosis not present

## 2024-02-11 DIAGNOSIS — I4891 Unspecified atrial fibrillation: Secondary | ICD-10-CM | POA: Diagnosis not present

## 2024-02-11 DIAGNOSIS — E785 Hyperlipidemia, unspecified: Secondary | ICD-10-CM | POA: Diagnosis not present

## 2024-02-11 DIAGNOSIS — I10 Essential (primary) hypertension: Secondary | ICD-10-CM | POA: Diagnosis not present

## 2024-02-11 DIAGNOSIS — I2510 Atherosclerotic heart disease of native coronary artery without angina pectoris: Secondary | ICD-10-CM | POA: Diagnosis not present

## 2024-02-11 LAB — BASIC METABOLIC PANEL WITH GFR
BUN/Creatinine Ratio: 20 (ref 10–24)
BUN: 16 mg/dL (ref 8–27)
CO2: 24 mmol/L (ref 20–29)
Calcium: 9.6 mg/dL (ref 8.6–10.2)
Chloride: 106 mmol/L (ref 96–106)
Creatinine, Ser: 0.82 mg/dL (ref 0.76–1.27)
Glucose: 103 mg/dL — ABNORMAL HIGH (ref 70–99)
Potassium: 5.3 mmol/L — ABNORMAL HIGH (ref 3.5–5.2)
Sodium: 143 mmol/L (ref 134–144)
eGFR: 93 mL/min/1.73 (ref >59)

## 2024-02-11 LAB — CBC
Hematocrit: 44.2 % (ref 37.5–51.0)
Hemoglobin: 14.5 g/dL (ref 13.0–17.7)
MCH: 31.8 pg (ref 26.6–33.0)
MCHC: 32.8 g/dL (ref 31.5–35.7)
MCV: 97 fL (ref 79–97)
Platelets: 182 x10E3/uL (ref 150–450)
RBC: 4.56 x10E6/uL (ref 4.14–5.80)
RDW: 11.8 % (ref 11.6–15.4)
WBC: 5.5 x10E3/uL (ref 3.4–10.8)

## 2024-02-11 LAB — LIPID PANEL
Chol/HDL Ratio: 2.2 ratio (ref 0.0–5.0)
Cholesterol, Total: 111 mg/dL (ref 100–199)
HDL: 50 mg/dL (ref >39)
LDL Chol Calc (NIH): 47 mg/dL (ref 0–99)
Triglycerides: 66 mg/dL (ref 0–149)
VLDL Cholesterol Cal: 14 mg/dL (ref 5–40)

## 2024-02-15 ENCOUNTER — Ambulatory Visit: Payer: Self-pay | Admitting: Emergency Medicine

## 2024-02-15 DIAGNOSIS — Z79899 Other long term (current) drug therapy: Secondary | ICD-10-CM

## 2024-02-18 DIAGNOSIS — Z79899 Other long term (current) drug therapy: Secondary | ICD-10-CM | POA: Diagnosis not present

## 2024-02-19 LAB — BASIC METABOLIC PANEL WITH GFR
BUN/Creatinine Ratio: 18 (ref 10–24)
BUN: 16 mg/dL (ref 8–27)
CO2: 22 mmol/L (ref 20–29)
Calcium: 9.4 mg/dL (ref 8.6–10.2)
Chloride: 102 mmol/L (ref 96–106)
Creatinine, Ser: 0.91 mg/dL (ref 0.76–1.27)
Glucose: 103 mg/dL — ABNORMAL HIGH (ref 70–99)
Potassium: 4 mmol/L (ref 3.5–5.2)
Sodium: 137 mmol/L (ref 134–144)
eGFR: 89 mL/min/1.73 (ref >59)

## 2024-03-09 ENCOUNTER — Encounter (HOSPITAL_COMMUNITY): Payer: Self-pay | Admitting: Otolaryngology

## 2024-03-10 ENCOUNTER — Encounter (HOSPITAL_COMMUNITY): Payer: Self-pay | Admitting: Otolaryngology

## 2024-03-10 ENCOUNTER — Ambulatory Visit (HOSPITAL_COMMUNITY)
Admission: RE | Admit: 2024-03-10 | Discharge: 2024-03-10 | Disposition: A | Discharge status: Home / Self Care | Source: Ambulatory Visit | Attending: Internal Medicine | Admitting: Internal Medicine

## 2024-03-10 ENCOUNTER — Other Ambulatory Visit: Payer: Self-pay

## 2024-03-10 ENCOUNTER — Telehealth (INDEPENDENT_AMBULATORY_CARE_PROVIDER_SITE_OTHER): Payer: Self-pay | Admitting: Otolaryngology

## 2024-03-10 DIAGNOSIS — Z953 Presence of xenogenic heart valve: Secondary | ICD-10-CM | POA: Diagnosis present

## 2024-03-10 NOTE — Anesthesia Preprocedure Evaluation (Signed)
 Anesthesia Evaluation  Patient identified by MRN, date of birth, ID band Patient awake  General Assessment Comment: Hypertrophy of nasal turbinates  Reviewed: Allergy & Precautions, NPO status , Patient's Chart, lab work & pertinent test results  Airway Mallampati: II  TM Distance: >3 FB Neck ROM: Full    Dental  (+) Teeth Intact, Dental Advisory Given   Pulmonary Current Smoker and Patient abstained from smoking.   Pulmonary exam normal breath sounds clear to auscultation       Cardiovascular hypertension, Pt. on medications (-) angina + CAD and + Past MI  Normal cardiovascular exam+ dysrhythmias Atrial Fibrillation + Valvular Problems/Murmurs (s/p AVR)  Rhythm:Regular Rate:Normal     Neuro/Psych  PSYCHIATRIC DISORDERS Anxiety Depression    negative neurological ROS     GI/Hepatic ,GERD  Medicated,,(+)     substance abuse  alcohol use  Endo/Other  Obesity   Renal/GU negative Renal ROS     Musculoskeletal  (+) Arthritis ,    Abdominal   Peds  Hematology negative hematology ROS (+)   Anesthesia Other Findings Day of surgery medications reviewed with the patient.  Reproductive/Obstetrics                              Anesthesia Physical Anesthesia Plan  ASA: 3  Anesthesia Plan: General   Post-op Pain Management: Tylenol  PO (pre-op )*   Induction: Intravenous  PONV Risk Score and Plan: 2 and Dexamethasone  and Ondansetron   Airway Management Planned: Oral ETT  Additional Equipment:   Intra-op Plan:   Post-operative Plan: Extubation in OR  Informed Consent: I have reviewed the patients History and Physical, chart, labs and discussed the procedure including the risks, benefits and alternatives for the proposed anesthesia with the patient or authorized representative who has indicated his/her understanding and acceptance.     Dental advisory given  Plan Discussed with:  CRNA  Anesthesia Plan Comments: (See recent PAT note by Lynwood Hope, PA-C 01/28/24.  Patient subsequently seen in cardiology follow-up by Lum Louis, NP on 02/03/2024 and cleared for surgery.  Per note, Pending B/L turbinate reduction and date TBD with [facility]. According to the Revised Cardiac Risk Index (RCRI), his Perioperative Risk of Major Cardiac Event is (%): 0.9. His Functional Capacity in METs is: 5.62 according to the Duke Activity Status Index (DASI). Therefore, based on ACC/AHA guidelines, patient would be at acceptable risk for the planned procedure without further cardiovascular testing. I will route this recommendation to the requesting party via Epic fax function. He may hold aspirin  for 5-7 days prior to procedure. Please resume aspirin  as soon as possible postprocedure, at the discretion of the surgeon.   )         Anesthesia Quick Evaluation

## 2024-03-10 NOTE — Progress Notes (Signed)
 PCP - Cook, Jayce G, DO  Cardiologist - Verlin Lonni BIRCH, MD  Electrophysiology - Cardiology Inocencio Soyla Lunger, MD   PPM/ICD - denies Device Orders - n/a Rep Notified - n/a  Chest x-ray -  EKG - 02-03-24 Stress Test - many years ago per patient ECHO - 10-10-22, 03-10-24 Cardiac Cath - 10-03-22  CPAP - denies  DM -denies  Blood Thinner Instructions: denies Aspirin  Instructions: Last dose 03-10-2024  ERAS Protcol - clear liquids until 11:15 am.  COVID TEST- N/a  Anesthesia review: Yes,  Cardiac hx  Patient verbally denies any shortness of breath, fever, cough and chest pain during phone call   -------------  SDW INSTRUCTIONS given:  Your procedure is scheduled on March 11, 2024.  Report to [facility] Main Entrance A at 11:45 A.M., and check in at the Admitting office.  Call this number if you have problems the morning of surgery:  [phone]   Remember:  Do not eat after midnight the night before your surgery  You may drink clear liquids until 11:15 the morning of your surgery.   Clear liquids allowed are: Water , Non-Citrus Juices (without pulp), Carbonated Beverages, Clear Tea, Black Coffee Only, and Gatorade    Take these medicines the morning of surgery with A SIP OF WATER   acetaminophen  (TYLENOL )  amLODipine  (NORVASC )  citalopram  (CELEXA )  fluticasone  (FLONASE )  omeprazole  (PRILOSEC )    As of today, STOP taking any Aspirin  (unless otherwise instructed by your surgeon) Aleve, Naproxen, Ibuprofen , Motrin , Advil , Goody's, BC's, all herbal medications, fish oil, and all vitamins.                      Do not wear jewelry, make up, or nail polish            Do not wear lotions, powders, perfumes/colognes, or deodorant.            Do not shave 48 hours prior to surgery.  Men may shave face and neck.            Do not bring valuables to the hospital.            [facility] is not responsible for any belongings or valuables.  Do NOT Smoke  (Tobacco/Vaping) 24 hours prior to your procedure If you use a CPAP at night, you may bring all equipment for your overnight stay.   Contacts, glasses, dentures or bridgework may not be worn into surgery.      For patients admitted to the hospital, discharge time will be determined by your treatment team.   Patients discharged the day of surgery will not be allowed to drive home, and someone needs to stay with them for 24 hours.    Special instructions:   [facility]- Preparing For Surgery  Before surgery, you can play an important role. Because skin is not sterile, your skin needs to be as free of germs as possible. You can reduce the number of germs on your skin by washing with CHG (chlorahexidine gluconate) Soap before surgery.  CHG is an antiseptic cleaner which kills germs and bonds with the skin to continue killing germs even after washing.    Oral Hygiene is also important to reduce your risk of infection.  Remember - BRUSH YOUR TEETH THE MORNING OF SURGERY WITH YOUR REGULAR TOOTHPASTE  Please do not use if you have an allergy to CHG or antibacterial soaps. If your skin becomes reddened/irritated stop using the CHG.  Do not shave (  including legs and underarms) for at least 48 hours prior to first CHG shower. It is OK to shave your face.  Please follow these instructions carefully.   Shower the NIGHT BEFORE SURGERY and the MORNING OF SURGERY with DIAL Soap.   Pat yourself dry with a CLEAN TOWEL.  Wear CLEAN PAJAMAS to bed the night before surgery  Place CLEAN SHEETS on your bed the night of your first shower and DO NOT SLEEP WITH PETS.   Day of Surgery: Please shower morning of surgery  Wear Clean/Comfortable clothing the morning of surgery Do not apply any deodorants/lotions.   Remember to brush your teeth WITH YOUR REGULAR TOOTHPASTE.   Questions were answered. Patient verbalized understanding of instructions.

## 2024-03-10 NOTE — Progress Notes (Signed)
 Brittany at Dr. Rojean office aware patient last dose of Asa 81 mg was 03-10-24

## 2024-03-10 NOTE — Telephone Encounter (Signed)
 Nurse from Main called to inform pt was supposed to hold aspirin  before surgery on 03/11/25 MAIN. Pt did take 81 milligrams of aspirin  today.

## 2024-03-10 NOTE — Telephone Encounter (Signed)
 Spoke with patient , told him not take his Aspirin  81 mg tomorrow, as pre op nurse told him .

## 2024-03-11 ENCOUNTER — Encounter (HOSPITAL_COMMUNITY): Payer: Self-pay | Admitting: Otolaryngology

## 2024-03-11 ENCOUNTER — Other Ambulatory Visit: Payer: Self-pay

## 2024-03-11 ENCOUNTER — Ambulatory Visit (HOSPITAL_COMMUNITY): Admitting: Physician Assistant

## 2024-03-11 ENCOUNTER — Encounter: Admission: RE | Disposition: A | Payer: Self-pay | Attending: Otolaryngology

## 2024-03-11 ENCOUNTER — Ambulatory Visit (HOSPITAL_COMMUNITY)
Admission: RE | Admit: 2024-03-11 | Discharge: 2024-03-11 | Disposition: A | Attending: Otolaryngology | Admitting: Otolaryngology

## 2024-03-11 DIAGNOSIS — Z6832 Body mass index (BMI) 32.0-32.9, adult: Secondary | ICD-10-CM | POA: Diagnosis not present

## 2024-03-11 DIAGNOSIS — I2510 Atherosclerotic heart disease of native coronary artery without angina pectoris: Secondary | ICD-10-CM

## 2024-03-11 DIAGNOSIS — M1990 Unspecified osteoarthritis, unspecified site: Secondary | ICD-10-CM | POA: Diagnosis not present

## 2024-03-11 DIAGNOSIS — E785 Hyperlipidemia, unspecified: Secondary | ICD-10-CM | POA: Diagnosis not present

## 2024-03-11 DIAGNOSIS — J329 Chronic sinusitis, unspecified: Secondary | ICD-10-CM | POA: Diagnosis not present

## 2024-03-11 DIAGNOSIS — E669 Obesity, unspecified: Secondary | ICD-10-CM | POA: Diagnosis not present

## 2024-03-11 DIAGNOSIS — F419 Anxiety disorder, unspecified: Secondary | ICD-10-CM | POA: Diagnosis not present

## 2024-03-11 DIAGNOSIS — I10 Essential (primary) hypertension: Secondary | ICD-10-CM

## 2024-03-11 DIAGNOSIS — J343 Hypertrophy of nasal turbinates: Secondary | ICD-10-CM

## 2024-03-11 DIAGNOSIS — F17200 Nicotine dependence, unspecified, uncomplicated: Secondary | ICD-10-CM | POA: Diagnosis not present

## 2024-03-11 DIAGNOSIS — Z79899 Other long term (current) drug therapy: Secondary | ICD-10-CM | POA: Diagnosis not present

## 2024-03-11 DIAGNOSIS — J3489 Other specified disorders of nose and nasal sinuses: Secondary | ICD-10-CM | POA: Diagnosis not present

## 2024-03-11 DIAGNOSIS — Z952 Presence of prosthetic heart valve: Secondary | ICD-10-CM | POA: Diagnosis not present

## 2024-03-11 DIAGNOSIS — I4891 Unspecified atrial fibrillation: Secondary | ICD-10-CM | POA: Diagnosis not present

## 2024-03-11 DIAGNOSIS — K219 Gastro-esophageal reflux disease without esophagitis: Secondary | ICD-10-CM | POA: Diagnosis not present

## 2024-03-11 DIAGNOSIS — F32A Depression, unspecified: Secondary | ICD-10-CM | POA: Diagnosis not present

## 2024-03-11 DIAGNOSIS — I252 Old myocardial infarction: Secondary | ICD-10-CM | POA: Diagnosis not present

## 2024-03-11 LAB — ECHOCARDIOGRAM COMPLETE
AR max vel: 1.99 cm2
AV Area VTI: 2.09 cm2
AV Area mean vel: 2 cm2
AV Mean grad: 18.7 mmHg
AV Peak grad: 34.8 mmHg
Ao pk vel: 2.95 m/s
Area-P 1/2: 3.27 cm2
S' Lateral: 2.8 cm

## 2024-03-11 SURGERY — REDUCTION, NASAL TURBINATE
Anesthesia: General | Site: Nose | Laterality: Bilateral

## 2024-03-11 MED ORDER — ONDANSETRON HCL 4 MG/2ML IJ SOLN
INTRAMUSCULAR | Status: AC
  Filled 2024-03-11: qty 10

## 2024-03-11 MED ORDER — BACITRACIN ZINC 500 UNIT/GM EX OINT
TOPICAL_OINTMENT | CUTANEOUS | Status: AC
  Filled 2024-03-11: qty 28.35

## 2024-03-11 MED ORDER — BACITRACIN ZINC 500 UNIT/GM EX OINT
TOPICAL_OINTMENT | CUTANEOUS | Status: DC | PRN
  Administered 2024-03-11: 1 via TOPICAL

## 2024-03-11 MED ORDER — SUCCINYLCHOLINE CHLORIDE 200 MG/10ML IV SOSY
PREFILLED_SYRINGE | INTRAVENOUS | Status: AC
  Filled 2024-03-11: qty 10

## 2024-03-11 MED ORDER — 0.9 % SODIUM CHLORIDE (POUR BTL) OPTIME
TOPICAL | Status: DC | PRN
  Administered 2024-03-11: 1000 mL

## 2024-03-11 MED ORDER — SUGAMMADEX SODIUM 200 MG/2ML IV SOLN
INTRAVENOUS | Status: AC
  Filled 2024-03-11: qty 2

## 2024-03-11 MED ORDER — PROPOFOL 10 MG/ML IV BOLUS
INTRAVENOUS | Status: DC | PRN
  Administered 2024-03-11: 100 mg via INTRAVENOUS
  Administered 2024-03-11: 10 mg via INTRAVENOUS

## 2024-03-11 MED ORDER — ROCURONIUM BROMIDE 10 MG/ML (PF) SYRINGE
PREFILLED_SYRINGE | INTRAVENOUS | Status: DC | PRN
  Administered 2024-03-11: 70 mg via INTRAVENOUS

## 2024-03-11 MED ORDER — LIDOCAINE 2% (20 MG/ML) 5 ML SYRINGE
INTRAMUSCULAR | Status: DC | PRN
  Administered 2024-03-11: 60 mg via INTRAVENOUS

## 2024-03-11 MED ORDER — FENTANYL CITRATE (PF) 250 MCG/5ML IJ SOLN
INTRAMUSCULAR | Status: DC | PRN
  Administered 2024-03-11 (×3): 50 ug via INTRAVENOUS
  Administered 2024-03-11: 100 ug via INTRAVENOUS

## 2024-03-11 MED ORDER — PROPOFOL 10 MG/ML IV BOLUS
INTRAVENOUS | Status: AC
  Filled 2024-03-11: qty 20

## 2024-03-11 MED ORDER — OXYMETAZOLINE HCL 0.05 % NA SOLN
NASAL | Status: AC
  Filled 2024-03-11: qty 30

## 2024-03-11 MED ORDER — SUGAMMADEX SODIUM 200 MG/2ML IV SOLN
INTRAVENOUS | Status: DC | PRN
  Administered 2024-03-11: 50 mg via INTRAVENOUS
  Administered 2024-03-11: 100 mg via INTRAVENOUS
  Administered 2024-03-11: 50 mg via INTRAVENOUS
  Administered 2024-03-11 (×2): 100 mg via INTRAVENOUS

## 2024-03-11 MED ORDER — ONDANSETRON HCL 4 MG/2ML IJ SOLN: 4.0000 mg | Freq: Once | INTRAMUSCULAR | Status: DC | PRN

## 2024-03-11 MED ORDER — ROCURONIUM BROMIDE 10 MG/ML (PF) SYRINGE
PREFILLED_SYRINGE | INTRAVENOUS | Status: AC
  Filled 2024-03-11: qty 10

## 2024-03-11 MED ORDER — PHENYLEPHRINE 80 MCG/ML (10ML) SYRINGE FOR IV PUSH (FOR BLOOD PRESSURE SUPPORT)
PREFILLED_SYRINGE | INTRAVENOUS | Status: AC
  Filled 2024-03-11: qty 10

## 2024-03-11 MED ORDER — CHLORHEXIDINE GLUCONATE 0.12 % MT SOLN
15.0000 mL | Freq: Once | OROMUCOSAL | Status: AC
  Administered 2024-03-11: 15 mL via OROMUCOSAL

## 2024-03-11 MED ORDER — ONDANSETRON HCL 4 MG/2ML IJ SOLN
INTRAMUSCULAR | Status: DC | PRN
  Administered 2024-03-11: 4 mg via INTRAVENOUS

## 2024-03-11 MED ORDER — LIDOCAINE 2% (20 MG/ML) 5 ML SYRINGE
INTRAMUSCULAR | Status: AC
  Filled 2024-03-11: qty 10

## 2024-03-11 MED ORDER — ORAL CARE MOUTH RINSE: 15.0000 mL | Freq: Once | OROMUCOSAL | Status: AC

## 2024-03-11 MED ORDER — LIDOCAINE-EPINEPHRINE 1 %-1:100000 IJ SOLN: INTRAMUSCULAR | Status: DC | PRN

## 2024-03-11 MED ORDER — DEXAMETHASONE SOD PHOSPHATE PF 10 MG/ML IJ SOLN
INTRAMUSCULAR | Status: DC | PRN
  Administered 2024-03-11: 10 mg via INTRAVENOUS

## 2024-03-11 MED ORDER — OXYMETAZOLINE HCL 0.05 % NA SOLN
NASAL | Status: DC | PRN
  Administered 2024-03-11: 1

## 2024-03-11 MED ORDER — ACETAMINOPHEN 500 MG PO TABS
1000.0000 mg | ORAL_TABLET | Freq: Once | ORAL | Status: AC
  Administered 2024-03-11: 1000 mg via ORAL

## 2024-03-11 MED ORDER — LACTATED RINGERS IV SOLN: INTRAVENOUS | Status: DC

## 2024-03-11 MED ORDER — FENTANYL CITRATE (PF) 100 MCG/2ML IJ SOLN
INTRAMUSCULAR | Status: AC
  Filled 2024-03-11: qty 2

## 2024-03-11 MED ORDER — CEFAZOLIN SODIUM-DEXTROSE 2-3 GM-%(50ML) IV SOLR
INTRAVENOUS | Status: DC | PRN
  Administered 2024-03-11: 2 g via INTRAVENOUS

## 2024-03-11 MED ORDER — CEFAZOLIN SODIUM 1 G IJ SOLR
INTRAMUSCULAR | Status: AC
  Filled 2024-03-11: qty 20

## 2024-03-11 MED ORDER — ACETAMINOPHEN 500 MG PO TABS
ORAL_TABLET | ORAL | Status: AC
  Filled 2024-03-11: qty 2

## 2024-03-11 MED ORDER — LIDOCAINE-EPINEPHRINE 1 %-1:100000 IJ SOLN
INTRAMUSCULAR | Status: AC
  Filled 2024-03-11: qty 1

## 2024-03-11 MED ORDER — FENTANYL CITRATE (PF) 100 MCG/2ML IJ SOLN
25.0000 ug | INTRAMUSCULAR | Status: DC | PRN
  Administered 2024-03-11 (×2): 50 ug via INTRAVENOUS

## 2024-03-11 MED ORDER — FENTANYL CITRATE (PF) 250 MCG/5ML IJ SOLN
INTRAMUSCULAR | Status: AC
  Filled 2024-03-11: qty 5

## 2024-03-11 SURGICAL SUPPLY — 24 items
BAG COUNTER SPONGE SURGICOUNT (BAG) ×1 IMPLANT
CANISTER SUCTION 3000ML PPV (SUCTIONS) ×1 IMPLANT
COAGULATOR SUCT SWTCH 10FR 6 (ELECTROSURGICAL) ×1 IMPLANT
COVER SURGICAL LIGHT HANDLE (MISCELLANEOUS) IMPLANT
DRAPE HALF SHEET 40X57 (DRAPES) IMPLANT
DRSG NASOPORE 8CM (GAUZE/BANDAGES/DRESSINGS) IMPLANT
ELECTRODE REM PT RTRN 9FT ADLT (ELECTROSURGICAL) ×1 IMPLANT
GAUZE SPONGE 2X2 8PLY STRL LF (GAUZE/BANDAGES/DRESSINGS) ×1 IMPLANT
GLOVE BIOGEL PI MICRO STRL 7.5 (GLOVE) ×1 IMPLANT
GOWN STRL REUS W/ TWL LRG LVL3 (GOWN DISPOSABLE) ×2 IMPLANT
KIT BASIN OR (CUSTOM PROCEDURE TRAY) ×1 IMPLANT
KIT TURNOVER KIT B (KITS) ×1 IMPLANT
NDL HYPO 25GX1X1/2 BEV (NEEDLE) ×1 IMPLANT
PAD ARMBOARD POSITIONER FOAM (MISCELLANEOUS) ×2 IMPLANT
PATTIES SURGICAL 3X3 (GAUZE/BANDAGES/DRESSINGS) IMPLANT
SET IV EXT TUBING 30 (IV SETS) IMPLANT
SOLN 0.9% NACL POUR BTL 1000ML (IV SOLUTION) ×1 IMPLANT
SPLINT NASAL DOYLE BI-VL (GAUZE/BANDAGES/DRESSINGS) ×1 IMPLANT
SPONGE NEURO XRAY DETECT 1X3 (DISPOSABLE) ×1 IMPLANT
SUT CHROMIC 4 0 SH 27 (SUTURE) ×1 IMPLANT
SUT PLAIN 4 0 ~~LOC~~ 1 (SUTURE) ×1 IMPLANT
SUT PROLENE 3 0 PS 2 (SUTURE) ×1 IMPLANT
TRAY ENT MC OR (CUSTOM PROCEDURE TRAY) ×1 IMPLANT
TUBE SALEM SUMP 16F (TUBING) IMPLANT

## 2024-03-11 NOTE — Transfer of Care (Signed)
 Immediate Anesthesia Transfer of Care Note  Patient: Jon Jackson  Procedure(s) Performed: REDUCTION, NASAL TURBINATE (Bilateral: Nose)  Patient Location: PACU  Anesthesia Type:General  Level of Consciousness: awake, alert , oriented, patient cooperative, and responds to stimulation  Airway & Oxygen Therapy: Patient Spontanous Breathing and Patient connected to face mask oxygen  Post-op Assessment: Report given to RN and Post -op Vital signs reviewed and stable  Post vital signs: Reviewed and stable  Last Vitals:  Vitals Value Taken Time  BP 158/89 03/11/24 14:47  Temp 36.8 C 03/11/24 14:47  Pulse 88 03/11/24 14:53  Resp 11 03/11/24 14:53  SpO2 96% 03/11/24 14:53  Vitals shown include unfiled device data.  Last Pain:  Vitals:   03/11/24 1447  TempSrc:   PainSc: 0-No pain      Patients Stated Pain Goal: 0 (03/11/24 1152)  Complications: No notable events documented. O2 10L FM, sats 95%, pt A/O. Nasal drip pad in place.  O2 removed, sats 93-95.

## 2024-03-11 NOTE — Discharge Instructions (Addendum)

## 2024-03-11 NOTE — H&P (Signed)
 Cc: Chronic nasal obstruction, recurrent sinusitis   Discussed the use of AI scribe software for clinical note transcription with the patient, who gave verbal consent to proceed.   History of Present Illness Jon Jackson is a 73 year old male who presents with recurrent sinusitis and chronic nasal obstruction.   He returns for follow-up regarding recurrent sinusitis and chronic nasal obstruction, last seen one month ago with these complaints. Despite using Flonase , Xyzal , and saline irrigation, he continues to experience significant nasal congestion and pressure, particularly during perceived infections.   The worst symptoms are congestion and pressure, accompanied by headaches. He continues to use Xyzal  and Flonase , especially during allergy seasons like spring and fall, using Flonase  daily during these times.    His recent CT scan shows no significant acute or chronic sinusitis.  However, his nasal passageways are severely obstructed by bilateral inferior turbinate hypertrophy.   His past medical history includes an aortic valve replacement about a year ago. No irregular heartbeat, heart attack, or use of blood thinners. He has had two back surgeries in the past, performed by a neurosurgeon, but has not seen a neurologist for his headaches.   Exam: General: Communicates without difficulty, well nourished, no acute distress. Head: Normocephalic, no evidence injury, no tenderness, facial buttresses intact without stepoff. Face/sinus: No tenderness to palpation and percussion. Facial movement is normal and symmetric. Eyes: PERRL, EOMI. No scleral icterus, conjunctivae clear. Neuro: CN II exam reveals vision grossly intact.  No nystagmus at any point of gaze. Ears: Auricles well formed without lesions.  Ear canals are intact without mass or lesion.  No erythema or edema is appreciated.  The TMs are intact without fluid. Nose: External evaluation reveals normal support and skin without lesions.   Dorsum is intact.  Anterior rhinoscopy reveals congested mucosa over anterior aspect of inferior turbinates and intact septum.  No purulence noted. Oral:  Oral cavity and oropharynx are intact, symmetric, without erythema or edema.  Mucosa is moist without lesions. Neck: Full range of motion without pain.  There is no significant lymphadenopathy.  No masses palpable.  Thyroid  bed within normal limits to palpation.  Parotid glands and submandibular glands equal bilaterally without mass.  Trachea is midline. Neuro:  CN 2-12 grossly intact.    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  More than 95% of his nasal passageways are obstructed bilaterally.  CT scan reveals no significant sinusitis, but nasal passageways are obstructed by enlarged turbinates. Current treatment with Flonase  and Xyzal  is ongoing. Nasal obstruction is not related to sinus infection, and headaches may be due to a neurologic cause such as tension or migraine headaches. - Continue Flonase  daily during allergy season. - Consider turbinate reduction surgery to alleviate nasal obstruction. - The risk, benefits, alternatives, and details of the inferior turbinate reduction surgery are extensively discussed with the patient.  Questions were invited and answered. - The patient would like to proceed with the procedure.

## 2024-03-11 NOTE — Op Note (Signed)
 DATE OF PROCEDURE: 03/11/2024  OPERATIVE REPORT   SURGEON: Daniel Moccasin, MD   PREOPERATIVE DIAGNOSES:  1. Chronic nasal obstruction.  2. Bilateral inferior turbinate hypertrophy.   POSTOPERATIVE DIAGNOSES:  1. Chronic nasal obstruction.  2. Bilateral inferior turbinate hypertrophy.   PROCEDURE PERFORMED: Bilateral partial inferior turbinate resection.   ANESTHESIA: General endotracheal tube anesthesia.   COMPLICATIONS: None.   ESTIMATED BLOOD LOSS: 100 ml.  INDICATION FOR PROCEDURE :Jon Jackson is a 73 y.o. male with a history of chronic nasal obstruction. The patient was treated with antihistamine, decongestant, and steroid nasal sprays. However, the patient continued to be symptomatic. On examination, the patient was noted to have bilateral severe inferior turbinate hypertrophy, causing significant nasal obstruction. Based on the above findings, the decision was made for the patient to undergo the above-stated procedure. The risks, benefits, alternatives, and details of the procedure were discussed with the patient. Questions were invited and answered. Informed consent was obtained.   DESCRIPTION: The patient was taken to the operating room and placed supine on the operating table. General endotracheal tube anesthesia was administered by the anesthesiologist. The patient was positioned and prepped and draped in a standard fashion for nasal surgery. Pledgets soaked with Afrin were placed in both nasal cavities. The pledgets were subsequently removed. Examination of the nasal cavities revealed bilateral severe inferior turbinate hypertrophy. The inferior one-half of each inferior turbinate was then crossclamped with a straight Kelly clamp. The inferior one-half of each inferior turbinate was then resected with a pair of cross cutting scissors. Hemostasis was achieved with suction electrocautery, under direct visual guidance of the zero-degree endoscope. Good hemostasis was achieved. The care  of the patient was turned over to the anesthesiologist. The patient was awakened from anesthesia without difficulty. The patient was extubated and transferred to the recovery room in good condition.   OPERATIVE FINDINGS: Bilateral inferior turbinate hypertrophy.   SPECIMEN: None.   FOLLOWUP CARE: The patient will be discharged home once he is awake and alert. The patient will follow up in my office in approximately 3 days for splint removal.   Daniel Moccasin, MD

## 2024-03-14 ENCOUNTER — Encounter (HOSPITAL_COMMUNITY): Payer: Self-pay | Admitting: Otolaryngology

## 2024-03-14 ENCOUNTER — Ambulatory Visit (INDEPENDENT_AMBULATORY_CARE_PROVIDER_SITE_OTHER): Admitting: Otolaryngology

## 2024-03-14 VITALS — BP 152/68 | HR 80 | Ht 69.0 in | Wt 220.0 lb

## 2024-03-14 DIAGNOSIS — J343 Hypertrophy of nasal turbinates: Secondary | ICD-10-CM

## 2024-03-14 DIAGNOSIS — Z09 Encounter for follow-up examination after completed treatment for conditions other than malignant neoplasm: Secondary | ICD-10-CM

## 2024-03-14 NOTE — Anesthesia Postprocedure Evaluation (Signed)
 Anesthesia Post Note  Patient: Jon Jackson  Procedure(s) Performed: REDUCTION, NASAL TURBINATE (Bilateral: Nose)     Patient location during evaluation: PACU Anesthesia Type: General Level of consciousness: awake and alert Pain management: pain level controlled Vital Signs Assessment: post-procedure vital signs reviewed and stable Respiratory status: spontaneous breathing, nonlabored ventilation, respiratory function stable and patient connected to nasal cannula oxygen Cardiovascular status: blood pressure returned to baseline and stable Postop Assessment: no apparent nausea or vomiting Anesthetic complications: no   No notable events documented.  Last Vitals:  Vitals:   03/11/24 1530 03/11/24 1545  BP: (!) 121/92 134/79  Pulse: 75 79  Resp: 12 12  Temp:  36.8 C  SpO2: 90% 97%    Last Pain:  Vitals:   03/11/24 1516  TempSrc:   PainSc: 4                  Lynwood MARLA Cornea

## 2024-03-14 NOTE — Progress Notes (Signed)
 Doyle splints removed. Turbinates are healing well.   Both [place] debrided.  Nasal saline irrigation.  Recheck in 4 weeks.

## 2024-04-08 ENCOUNTER — Encounter (INDEPENDENT_AMBULATORY_CARE_PROVIDER_SITE_OTHER): Admitting: Otolaryngology

## 2024-04-13 ENCOUNTER — Encounter (INDEPENDENT_AMBULATORY_CARE_PROVIDER_SITE_OTHER): Payer: Self-pay | Admitting: Otolaryngology

## 2024-04-13 ENCOUNTER — Ambulatory Visit (INDEPENDENT_AMBULATORY_CARE_PROVIDER_SITE_OTHER): Admitting: Otolaryngology

## 2024-04-13 VITALS — BP 125/72 | HR 82 | Temp 98.0°F | Wt 220.0 lb

## 2024-04-13 DIAGNOSIS — Z09 Encounter for follow-up examination after completed treatment for conditions other than malignant neoplasm: Secondary | ICD-10-CM

## 2024-04-13 DIAGNOSIS — J343 Hypertrophy of nasal turbinates: Secondary | ICD-10-CM

## 2024-04-13 DIAGNOSIS — J310 Chronic rhinitis: Secondary | ICD-10-CM

## 2024-04-13 NOTE — Progress Notes (Signed)
Turbinates are healing well.  Both [place] debrided.  Nasal saline irrigation as needed.  Recheck in 6 months.

## 2024-05-02 ENCOUNTER — Ambulatory Visit: Admitting: Emergency Medicine

## 2024-05-04 ENCOUNTER — Other Ambulatory Visit: Payer: Self-pay | Admitting: Family Medicine

## 2024-06-07 ENCOUNTER — Ambulatory Visit: Admitting: Emergency Medicine

## 2024-10-12 ENCOUNTER — Ambulatory Visit (INDEPENDENT_AMBULATORY_CARE_PROVIDER_SITE_OTHER): Admitting: Otolaryngology
